# Patient Record
Sex: Female | Born: 1945
Health system: Southern US, Community
[De-identification: ages and names within clinical notes are randomized; demographics above are authoritative.]

## PROBLEM LIST (undated history)

## (undated) DIAGNOSIS — C801 Malignant (primary) neoplasm, unspecified: Secondary | ICD-10-CM

## (undated) DIAGNOSIS — G4733 Obstructive sleep apnea (adult) (pediatric): Secondary | ICD-10-CM

## (undated) DIAGNOSIS — R112 Nausea with vomiting, unspecified: Secondary | ICD-10-CM

## (undated) DIAGNOSIS — Z9989 Dependence on other enabling machines and devices: Secondary | ICD-10-CM

## (undated) DIAGNOSIS — G4700 Insomnia, unspecified: Secondary | ICD-10-CM

## (undated) DIAGNOSIS — N6009 Solitary cyst of unspecified breast: Secondary | ICD-10-CM

## (undated) DIAGNOSIS — Z9889 Other specified postprocedural states: Secondary | ICD-10-CM

## (undated) DIAGNOSIS — U071 COVID-19: Secondary | ICD-10-CM

## (undated) DIAGNOSIS — Z91018 Allergy to other foods: Secondary | ICD-10-CM

## (undated) DIAGNOSIS — I10 Essential (primary) hypertension: Secondary | ICD-10-CM

## (undated) DIAGNOSIS — H1010 Acute atopic conjunctivitis, unspecified eye: Secondary | ICD-10-CM

## (undated) DIAGNOSIS — K219 Gastro-esophageal reflux disease without esophagitis: Secondary | ICD-10-CM

## (undated) HISTORY — DX: Allergy to other foods: Z91.018

## (undated) HISTORY — DX: Insomnia, unspecified: G47.00

## (undated) HISTORY — PX: TONSILLECTOMY: SHX5217

## (undated) HISTORY — PX: OTHER SURGICAL HISTORY: SHX169

## (undated) HISTORY — PX: DILATION AND CURETTAGE OF UTERUS: SHX78

## (undated) HISTORY — DX: Obstructive sleep apnea (adult) (pediatric): G47.33

## (undated) HISTORY — DX: COVID-19: U07.1

## (undated) HISTORY — DX: Malignant (primary) neoplasm, unspecified: C80.1

## (undated) HISTORY — PX: CATARACT EXTRACTION, BILATERAL: SHX1313

## (undated) HISTORY — DX: Dependence on other enabling machines and devices: Z99.89

## (undated) HISTORY — DX: Essential (primary) hypertension: I10

## (undated) HISTORY — DX: Acute atopic conjunctivitis, unspecified eye: H10.10

## (undated) HISTORY — DX: Solitary cyst of unspecified breast: N60.09

## (undated) HISTORY — DX: Gastro-esophageal reflux disease without esophagitis: K21.9

---

## 1976-05-14 HISTORY — PX: TOTAL ABDOMINAL HYSTERECTOMY: SHX209

## 1989-05-14 HISTORY — PX: CHOLECYSTECTOMY: SHX55

## 1999-09-04 ENCOUNTER — Encounter: Admission: RE | Admit: 1999-09-04 | Discharge: 1999-09-04 | Payer: Self-pay | Admitting: Obstetrics and Gynecology

## 1999-09-04 ENCOUNTER — Encounter: Payer: Self-pay | Admitting: Obstetrics and Gynecology

## 2000-01-30 ENCOUNTER — Encounter (INDEPENDENT_AMBULATORY_CARE_PROVIDER_SITE_OTHER): Payer: Self-pay | Admitting: Specialist

## 2000-01-30 ENCOUNTER — Other Ambulatory Visit: Admission: RE | Admit: 2000-01-30 | Discharge: 2000-01-30 | Payer: Self-pay | Admitting: Gastroenterology

## 2000-12-13 ENCOUNTER — Encounter: Admission: RE | Admit: 2000-12-13 | Discharge: 2000-12-13 | Payer: Self-pay | Admitting: Obstetrics and Gynecology

## 2000-12-13 ENCOUNTER — Encounter: Payer: Self-pay | Admitting: Obstetrics and Gynecology

## 2001-12-19 ENCOUNTER — Encounter: Payer: Self-pay | Admitting: Obstetrics and Gynecology

## 2001-12-19 ENCOUNTER — Encounter: Admission: RE | Admit: 2001-12-19 | Discharge: 2001-12-19 | Payer: Self-pay | Admitting: Obstetrics and Gynecology

## 2002-01-28 ENCOUNTER — Encounter: Payer: Self-pay | Admitting: Cardiology

## 2002-01-28 ENCOUNTER — Ambulatory Visit (HOSPITAL_COMMUNITY): Admission: RE | Admit: 2002-01-28 | Discharge: 2002-01-28 | Payer: Self-pay | Admitting: Cardiology

## 2003-02-10 ENCOUNTER — Encounter: Admission: RE | Admit: 2003-02-10 | Discharge: 2003-02-10 | Payer: Self-pay | Admitting: Obstetrics and Gynecology

## 2003-02-10 ENCOUNTER — Encounter: Payer: Self-pay | Admitting: Obstetrics and Gynecology

## 2004-05-14 HISTORY — PX: ROTATOR CUFF REPAIR: SHX139

## 2004-05-25 ENCOUNTER — Ambulatory Visit: Payer: Self-pay | Admitting: Internal Medicine

## 2004-08-18 ENCOUNTER — Ambulatory Visit: Payer: Self-pay | Admitting: Internal Medicine

## 2004-10-20 ENCOUNTER — Ambulatory Visit: Payer: Self-pay | Admitting: Internal Medicine

## 2006-01-30 ENCOUNTER — Ambulatory Visit: Payer: Self-pay | Admitting: Internal Medicine

## 2006-03-27 ENCOUNTER — Ambulatory Visit: Payer: Self-pay | Admitting: Internal Medicine

## 2006-05-15 ENCOUNTER — Ambulatory Visit: Payer: Self-pay | Admitting: Internal Medicine

## 2006-07-17 ENCOUNTER — Ambulatory Visit: Payer: Self-pay | Admitting: Internal Medicine

## 2006-09-18 ENCOUNTER — Ambulatory Visit: Payer: Self-pay | Admitting: Internal Medicine

## 2006-10-01 ENCOUNTER — Ambulatory Visit: Payer: Self-pay | Admitting: Internal Medicine

## 2006-11-13 ENCOUNTER — Ambulatory Visit: Payer: Self-pay | Admitting: Internal Medicine

## 2007-01-15 ENCOUNTER — Ambulatory Visit: Payer: Self-pay | Admitting: Internal Medicine

## 2007-03-26 ENCOUNTER — Ambulatory Visit: Payer: Self-pay | Admitting: Internal Medicine

## 2007-04-15 DIAGNOSIS — G4700 Insomnia, unspecified: Secondary | ICD-10-CM | POA: Insufficient documentation

## 2007-04-15 DIAGNOSIS — J302 Other seasonal allergic rhinitis: Secondary | ICD-10-CM | POA: Insufficient documentation

## 2007-04-15 DIAGNOSIS — H1045 Other chronic allergic conjunctivitis: Secondary | ICD-10-CM | POA: Insufficient documentation

## 2007-04-15 DIAGNOSIS — J3089 Other allergic rhinitis: Secondary | ICD-10-CM

## 2007-04-15 DIAGNOSIS — I10 Essential (primary) hypertension: Secondary | ICD-10-CM | POA: Insufficient documentation

## 2007-05-28 ENCOUNTER — Ambulatory Visit: Payer: Self-pay | Admitting: Internal Medicine

## 2007-05-30 DIAGNOSIS — K219 Gastro-esophageal reflux disease without esophagitis: Secondary | ICD-10-CM | POA: Insufficient documentation

## 2007-08-06 ENCOUNTER — Ambulatory Visit: Payer: Self-pay | Admitting: Internal Medicine

## 2007-10-07 ENCOUNTER — Ambulatory Visit: Payer: Self-pay | Admitting: Internal Medicine

## 2007-12-10 ENCOUNTER — Ambulatory Visit: Payer: Self-pay | Admitting: Internal Medicine

## 2008-02-16 ENCOUNTER — Ambulatory Visit: Payer: Self-pay | Admitting: Internal Medicine

## 2008-04-21 ENCOUNTER — Ambulatory Visit: Payer: Self-pay | Admitting: Internal Medicine

## 2008-06-30 ENCOUNTER — Ambulatory Visit: Payer: Self-pay | Admitting: Internal Medicine

## 2008-06-30 DIAGNOSIS — J4520 Mild intermittent asthma, uncomplicated: Secondary | ICD-10-CM | POA: Insufficient documentation

## 2008-06-30 DIAGNOSIS — J209 Acute bronchitis, unspecified: Secondary | ICD-10-CM | POA: Insufficient documentation

## 2008-09-08 ENCOUNTER — Ambulatory Visit: Payer: Self-pay | Admitting: Internal Medicine

## 2008-11-10 ENCOUNTER — Ambulatory Visit: Payer: Self-pay | Admitting: Internal Medicine

## 2008-11-10 DIAGNOSIS — G43109 Migraine with aura, not intractable, without status migrainosus: Secondary | ICD-10-CM | POA: Insufficient documentation

## 2009-01-12 ENCOUNTER — Ambulatory Visit: Payer: Self-pay | Admitting: Internal Medicine

## 2009-03-28 ENCOUNTER — Ambulatory Visit: Payer: Self-pay | Admitting: Internal Medicine

## 2009-06-01 ENCOUNTER — Ambulatory Visit: Payer: Self-pay | Admitting: Internal Medicine

## 2009-06-19 ENCOUNTER — Ambulatory Visit (HOSPITAL_BASED_OUTPATIENT_CLINIC_OR_DEPARTMENT_OTHER): Admission: RE | Admit: 2009-06-19 | Discharge: 2009-06-19 | Payer: Self-pay | Admitting: Internal Medicine

## 2009-06-19 ENCOUNTER — Encounter: Payer: Self-pay | Admitting: Internal Medicine

## 2009-07-06 ENCOUNTER — Ambulatory Visit: Payer: Self-pay | Admitting: Internal Medicine

## 2009-08-03 ENCOUNTER — Encounter: Payer: Self-pay | Admitting: Internal Medicine

## 2009-08-03 ENCOUNTER — Ambulatory Visit (HOSPITAL_BASED_OUTPATIENT_CLINIC_OR_DEPARTMENT_OTHER): Admission: RE | Admit: 2009-08-03 | Discharge: 2009-08-03 | Payer: Self-pay | Admitting: Internal Medicine

## 2009-09-07 ENCOUNTER — Ambulatory Visit: Payer: Self-pay | Admitting: Internal Medicine

## 2009-09-07 DIAGNOSIS — G4733 Obstructive sleep apnea (adult) (pediatric): Secondary | ICD-10-CM | POA: Insufficient documentation

## 2009-09-16 ENCOUNTER — Telehealth (INDEPENDENT_AMBULATORY_CARE_PROVIDER_SITE_OTHER): Payer: Self-pay | Admitting: *Deleted

## 2009-09-19 ENCOUNTER — Telehealth: Payer: Self-pay | Admitting: Internal Medicine

## 2009-10-06 ENCOUNTER — Encounter: Payer: Self-pay | Admitting: Internal Medicine

## 2009-10-17 ENCOUNTER — Telehealth: Payer: Self-pay | Admitting: Internal Medicine

## 2009-10-26 ENCOUNTER — Encounter: Payer: Self-pay | Admitting: Internal Medicine

## 2009-11-04 ENCOUNTER — Ambulatory Visit: Payer: Self-pay | Admitting: Internal Medicine

## 2009-11-04 DIAGNOSIS — G2581 Restless legs syndrome: Secondary | ICD-10-CM | POA: Insufficient documentation

## 2009-11-09 ENCOUNTER — Encounter: Payer: Self-pay | Admitting: Internal Medicine

## 2009-11-16 ENCOUNTER — Encounter: Payer: Self-pay | Admitting: Internal Medicine

## 2009-11-25 ENCOUNTER — Encounter: Payer: Self-pay | Admitting: Internal Medicine

## 2009-12-19 ENCOUNTER — Encounter: Payer: Self-pay | Admitting: Internal Medicine

## 2010-01-05 ENCOUNTER — Ambulatory Visit: Payer: Self-pay | Admitting: Internal Medicine

## 2010-02-02 ENCOUNTER — Ambulatory Visit: Payer: Self-pay | Admitting: Internal Medicine

## 2010-03-09 ENCOUNTER — Ambulatory Visit: Payer: Self-pay | Admitting: Internal Medicine

## 2010-05-10 ENCOUNTER — Ambulatory Visit: Payer: Self-pay | Admitting: Internal Medicine

## 2010-06-13 NOTE — Assessment & Plan Note (Signed)
Summary: ROV 2 MONTHS///KP   Primary Provider/Referring Provider:  A.Little/card  CC:  2 month follow up.  History of Present Illness:  01/12/09- Allergic rhinitis, insomnia Allergy symptoms have increased in past 2 weeks with dry cough, watery eyes, nasal congestion. Denies chest tightness, wheeze, phlegm, fever, reflux, chest pain or palpittation. Insomnia varies, but she never sleeps very well. Alfonso Patten is better than other things tried.  March 28, 2009-Allergic rhinitis, asthma, Insomnia Right ear hurts, dry cough x 2 -3 days. No sore throat, headache or fever.  She has had a lot of stress- grandson needing plastic surgery for dog bites.This impacts sleep and leaves her fatigued. Needs Flu vax as discussed.  June 01, 2009- Allergic rhinitis, asthma, Insomnia Family recorded her snoring and she discussed this with Dr Elana Alm, raising concern of possible sleep apnea. She has been treated here for insomnia complaint with difficulty initiating and maintaining sleep. Morning headache. Weight has been stable. Sleeps alone. She has been stressed by dog-bite injury of her grandson. She has had recurrent episodes of bronchitis, seemingly slow to clear with Z pak.   Medications Prior to Update: 1)  Estrace 1 Mg  Tabs (Estradiol) .... Take 1 Tablet By Mouth Once A Day 2)  Maxzide-25 37.5-25 Mg  Tabs (Triamterene-Hctz) .... Take 1 Tablet By Mouth Once A Day 3)  Vytorin 10-20 Mg  Tabs (Ezetimibe-Simvastatin) .... Take 1 Tablet By Mouth Once A Day 4)  Aciphex 20 Mg  Tbec (Rabeprazole Sodium) .... Take 1 Tablet By Mouth Once A Day 5)  Sular 17 Mg  Tb24 (Nisoldipine) .... Take 1 Tablet By Mouth Once A Day 6)  Labetalol Hcl 200 Mg  Tabs (Labetalol Hcl) .... Take 1 Tablet By Mouth Two Times A Day 7)  Lunesta 3 Mg  Tabs (Eszopiclone) .Marland Kitchen.. 1 For Sleep If Neede 8)  Fluticasone Propionate 50 Mcg/act Susp (Fluticasone Propionate) .Marland Kitchen.. 1-2 Sprays Each Nostril Daily  Current Medications  (verified): 1)  Estrace 1 Mg  Tabs (Estradiol) .... Take 1 Tablet By Mouth Once A Day 2)  Maxzide-25 37.5-25 Mg  Tabs (Triamterene-Hctz) .... Take 1 Tablet By Mouth Once A Day 3)  Vytorin 10-20 Mg  Tabs (Ezetimibe-Simvastatin) .... Take 1 Tablet By Mouth Once A Day 4)  Aciphex 20 Mg  Tbec (Rabeprazole Sodium) .... Take 1 Tablet By Mouth Once A Day 5)  Sular 17 Mg  Tb24 (Nisoldipine) .... Take 1 Tablet By Mouth Once A Day 6)  Labetalol Hcl 200 Mg  Tabs (Labetalol Hcl) .... Take 1 Tablet By Mouth Two Times A Day 7)  Lunesta 3 Mg  Tabs (Eszopiclone) .Marland Kitchen.. 1 For Sleep If Neede 8)  Fluticasone Propionate 50 Mcg/act Susp (Fluticasone Propionate) .Marland Kitchen.. 1-2 Sprays Each Nostril Daily  Allergies: 1)  ! Codeine 2)  ! Pcn 3)  ! Reglan 4)  ! Compazine 5)  ! Claritin (Loratadine) 6)  ! Ibuprofen (Ibuprofen) 7)  ! * Strawberries  Comments:  Nurse/Medical Assistant: The patient's medications and allergies were reviewed with the patient and were updated in the Medication and Allergy Lists.  Past History:  Past Medical History: Last updated: 09/08/2008 INSOMNIA (ICD-780.52) ALLERGIC RHINITIS (ICD-477.9) ALLERGIC CONJUNCTIVITIS (ICD-372.14) G E R D (ICD-530.81) ESOPHAGEAL REFLUX (ICD-530.81) HYPERTENSION (ICD-401.9) Food allergy- angioedema- strawberries Left breast cyst- ultrasound benign 2010  Past Surgical History: Last updated: 2007-06-21 Tonsillectomy hysterectomy  Family History: Last updated: 2007-06-21 Father died brain tumor Mother died COPD  Social History: Last updated: 06/01/2009 Patient never smoked.  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  The patient denies anorexia,  fever, weight loss, weight gain, vision loss, decreased hearing, hoarseness, chest pain, syncope, dyspnea on exertion, peripheral edema, prolonged cough, hemoptysis, abdominal pain, and severe indigestion/heartburn.    Vital Signs:  Patient profile:   65 year old female Height:      63 inches Weight:      189.25 pounds O2 Sat:      96 % on Room air Pulse rate:   78 / minute BP sitting:   138 / 74  (left arm) Cuff size:   regular  Vitals Entered By: Randell Loop CMA (June 01, 2009 11:05 AM)  O2 Sat at Rest %:  96 O2 Flow:  Room air CC: 2 month follow up Is Patient Diabetic? No Pain Assessment Patient in pain? no      Comments no changes in meds today   Physical Exam  Additional Exam:  General: A/Ox3; pleasant and cooperative, NAD, overweight, calm SKIN: no rash, lesions NODES: no lymphadenopathy HEENT: [place]/AT, EOM- WNL, Conjuctivae- clear, PERRLA, TM-cerumen, Nose- clear with clear mucus bridging, no polyps or erosions, Throat- clear and wnl, [place]  III NECK: Supple w/ fair ROM, JVD- none, normal carotid impulses w/o bruits  CHEST: Clear to P&A HEART: RRR, no m/g/r heard ABDOMEN: soft ZOX:WRUE, nl pulses, no edema  NEURO: Grossly intact to observation      Impression & Recommendations:  Problem # 1:  BRONCHITIS (ICD-490)  Recurrent bronchitis has sounded viral. We will get CXR. She never smoked and doesn't recognize reflux.  Problem # 2:  INSOMNIA (ICD-780.52)  Now question of obstructive sleep apnea. Body habitus, witnessed snoring, morning headaches and hypertension are consistent with this dx. We discussed the medical issues and will get a sleep study. Her updated medication list for this problem includes:    Lunesta 3 Mg Tabs (Eszopiclone) .Marland Kitchen... 1 for sleep if neede  Other Orders: Est. Patient Level II (45409) T-2 View CXR (71020TC) Sleep Disorder Referral (Sleep Disorder)  Patient Instructions: 1)  Please schedule a follow-up appointment in 1  month. 2)  See [facility] to schedule sleep study 3)  A chest x-ray has been recommended.  Your imaging study may require preauthorization.    Immunization History:  Influenza Immunization History:    Influenza:  historical (03/14/2009)

## 2010-06-13 NOTE — Letter (Signed)
Summary: CMN for CPAP Supplies/[facility]  CMN for CPAP Supplies/[facility]   Imported By: Sherian Rein 11/21/2009 10:05:08  _____________________________________________________________________  External Attachment:    Type:   Image     Comment:   External Document

## 2010-06-13 NOTE — Letter (Signed)
Summary: [facility]  [facility]   Imported By: Sherian Rein 12/15/2009 08:23:46  _____________________________________________________________________  External Attachment:    Type:   Image     Comment:   External Document

## 2010-06-13 NOTE — Letter (Signed)
Summary: [facility]  [facility]   Imported By: Sherian Rein 11/17/2009 15:01:11  _____________________________________________________________________  External Attachment:    Type:   Image     Comment:   External Document

## 2010-06-13 NOTE — Assessment & Plan Note (Signed)
Summary: ? bronchitis/kcw   Primary Provider/Referring Provider:  A.Little/card  CC:  Accute visit-? bronchitis-cough-dry and tightness in chest.Currently taking Prednisone and Tussionex; had Zpak already form PCP.Marland Kitchen  History of Present Illness: November 04, 2009- Allergic rhinitis, Asthma, Insomnia w. OSA Struggling still with CPAP. Mask fit remains an issue. She is trying very hard- good compliance but sleep is still very restless even with clonazepam. Aware more of restless legs, despite clonazepam. Much stress. Reviewed notes from cardiology about BP management.   January 05, 2010- Allergic rhinitis, asthma, Insomnia/ OSA Reports lower BP despite being off Norvasc- with less peripheral edema now. Not needing routine antihistamines. CPAP- she is wearing it longer and sleeping more deeply. Having some bad dreams. Discussed alternatives to CPAP, including nasal valves. It was helpful to meet with the [facility] staff. They fitted her with a nasal mask she likes better. Pressure is still 10.   February 02, 2010- Allergic rhinitis, asthma, Insomnia/ OSA Acute visit. Woke with substernal burning Sept 8. By 4 days later she was coughing, chest congested. Maybe sore throat initially. Began waking herself wheezing.  Dr Elana Alm had called in Z pak then pred pack- day 9/12.. Dry hack still. Denies ears or nose. Stomach ok. She is pretty sure she didn't reflux to start this. Heat on chest feels good. Has Tussionex- helps at night. Got Ventolin inhaler- not used enough to tell if it helps.   Continues trying to make CPAP work for her.    Preventive Screening-Counseling & Management  Alcohol-Tobacco     Smoking Status: never  Allergies: 1)  ! Codeine 2)  ! Pcn 3)  ! Reglan 4)  ! Compazine 5)  ! Ibuprofen (Ibuprofen) 6)  ! * Strawberries  Past History:  Past Medical History: Last updated: 09/07/2009 INSOMNIA (ICD-780.52) Obstructive sleep apnea- NPSG 08/03/09: AHI 17.6; CPAP 12/ AHI 0;  PLMA ALLERGIC RHINITIS (ICD-477.9) ALLERGIC CONJUNCTIVITIS (ICD-372.14) G E R D (ICD-530.81) ESOPHAGEAL REFLUX (ICD-530.81) HYPERTENSION (ICD-401.9) Food allergy- angioedema- strawberries Left breast cyst- ultrasound benign 2010  Past Surgical History: Last updated: 2007/06/10 Tonsillectomy hysterectomy  Family History: Last updated: June 10, 2007 Father died brain tumor Mother died COPD  Social History: Last updated: 06/01/2009 Patient never smoked.  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  The patient denies shortness of breath with activity, shortness of breath at rest, non-productive cough, coughing up blood, chest pain, irregular heartbeats, acid heartburn, indigestion, loss of appetite, weight change, abdominal pain, difficulty swallowing, tooth/dental problems, headaches, nasal congestion/difficulty breathing through nose, sneezing, ear ache, hand/feet swelling, rash, and fever.    Vital Signs:  Patient profile:   65 year old female Height:      63 inches Weight:      188 pounds BMI:     33.42 O2 Sat:      97 % on Room air Pulse rate:   81 / minute BP sitting:   138 / 78  (left arm) Cuff size:   regular  Vitals Entered By: Reynaldo Minium CMA (February 02, 2010 2:32 PM)  O2 Flow:  Room air CC: Accute visit-? bronchitis-cough-dry and tightness in chest.Currently taking Prednisone and Tussionex; had Zpak already form PCP.   Physical Exam  Additional Exam:  General: A/Ox3; pleasant and cooperative, NAD, overweight, calm SKIN: no rash, lesions, flushed,  clammy NODES: no lymphadenopathy HEENT: [place]/AT, EOM- WNL, Conjuctivae- clear, PERRLA, TM-cerumen, Nose- clear , Throat- clear and wnl, Mallampati  III NECK: Supple w/ fair ROM, JVD- none, normal carotid impulses w/o bruits  CHEST: Clear to  P&A HEART: RRR, no m/g/r heard ABDOMEN: soft WJX:BJYN, nl pulses, no edema  NEURO: Grossly intact to observation      Impression & Recommendations:  Problem # 1:  BRONCHITIS (ICD-490) Acute bronchits. it likely began as a viral process with little URI, but may progress to bacterial superinfection. She understands the conservative "fluid and rest" back bone options. DDX includes possibility that she might have refluxed and aspirated during her sleep. She doesn't think so.   Her updated medication list for this problem includes:    Benzonatate 100 Mg Caps (Benzonatate) .Marland Kitchen... 1-2 three times a day as needed for  cough  Problem # 2:  INSOMNIA WITH SLEEP APNEA UNSPECIFIED (ICD-780.51) Harder now while she doesn't feel well, but encouraged to keep working with CPAP. I am researching other options that might make sense.  Medications Added to Medication List This Visit: 1)  Benzonatate 100 Mg Caps (Benzonatate) .Marland Kitchen.. 1-2 three times a day as needed for  cough  Other Orders: Est. Patient Level III (82956)  Patient Instructions: 1)  keep appt 2)    3)  Fluids, rest, tylenol.finish the prednisone.  4)  benzonatate perles for cough if needed Prescriptions: BENZONATATE 100 MG CAPS (BENZONATATE) 1-2 three times a day as needed for  cough  #20 x 3   Entered and Authorized by:   Waymon Budge MD   Signed by:   Waymon Budge MD on 02/02/2010   Method used:   Print then Give to Patient   RxID:   [id number]

## 2010-06-13 NOTE — Assessment & Plan Note (Signed)
Summary: rov 2 months///kp   Primary Provider/Referring Provider:  A.Little/card  CC:  2 month follow up visit-Insomnia-still adjusting to CPAP machine.Teresa Molina  History of Present Illness:  January 05, 2010- Allergic rhinitis, asthma, Insomnia/ OSA Reports lower BP despite being off Norvasc- with less peripheral edema now. Not needing routine antihistamines. CPAP- she is wearing it longer and sleeping more deeply. Having some bad dreams. Discussed alternatives to CPAP, including nasal valves. It was helpful to meet with the [facility] staff. They fitted her with a nasal mask she likes better. Pressure is still 10.   February 02, 2010- Allergic rhinitis, asthma, Insomnia/ OSA Acute visit. Woke with substernal burning Sept 8. By 4 days later she was coughing, chest congested. Maybe sore throat initially. Began waking herself wheezing.  Dr Elana Alm had called in Z pak then pred pack- day 9/12.. Dry hack still. Denies ears or nose. Stomach ok. She is pretty sure she didn't reflux to start this. Heat on chest feels good. Has Tussionex- helps at night. Got Ventolin inhaler- not used enough to tell if it helps.   Continues trying to make CPAP work for her.  March 14, 2010- Allergic rhinitis, asthma, Insomnia/ OSA Nurse-CC: 2 month follow up visit-Insomnia-still adjusting to CPAP machine. Has has episode of bronchitis, resolving. no fever ofr puruent sputum. needs flu shot. Discused guidance for pneumovax and she will wait.  Still trying to learn to tolerate the CPAP machine and we discussed that and alternatives again today. Pressure is still at 10. She has searched a website for an ENT practice advertising insertion of palatal pillars and we discussed that as well as nasal valves/ Provent.    Preventive Screening-Counseling & Management  Alcohol-Tobacco     Smoking Status: never  Current Medications (verified): 1)  Estrace 1 Mg  Tabs (Estradiol) .... Take 1 Tablet By Mouth Once A Day 2)   Maxzide-25 37.5-25 Mg  Tabs (Triamterene-Hctz) .... Take 1 Tablet By Mouth Once A Day 3)  Vytorin 10-20 Mg  Tabs (Ezetimibe-Simvastatin) .... Take 1 Tablet By Mouth Once A Day 4)  Aciphex 20 Mg  Tbec (Rabeprazole Sodium) .... Take 1 Tablet By Mouth Once A Day 5)  Labetalol Hcl 200 Mg  Tabs (Labetalol Hcl) .... Take 1 Tablet By Mouth Two Times A Day 6)  Clonazepam 0.5 Mg Tabs (Clonazepam) .Teresa Molina.. 1-2 For Sleep If Needed 7)  Cpap [employer] .Teresa Molina.. 10 Cwp 8)  Furosemide 40 Mg Tabs (Furosemide) .... Take 1 By Mouth Once Daily As Needed 9)  Aspirin 81 Mg Tbec (Aspirin) .... Take 1 By Mouth Once Daily  Allergies (verified): 1)  ! Codeine 2)  ! Pcn 3)  ! Reglan 4)  ! Compazine 5)  ! Ibuprofen (Ibuprofen) 6)  ! * Strawberries  Past History:  Past Medical History: Last updated: 09/07/2009 INSOMNIA (ICD-780.52) Obstructive sleep apnea- NPSG 08/03/09: AHI 17.6; CPAP 12/ AHI 0; PLMA ALLERGIC RHINITIS (ICD-477.9) ALLERGIC CONJUNCTIVITIS (ICD-372.14) G E R D (ICD-530.81) ESOPHAGEAL REFLUX (ICD-530.81) HYPERTENSION (ICD-401.9) Food allergy- angioedema- strawberries Left breast cyst- ultrasound benign 2010  Past Surgical History: Last updated: June 25, 2007 Tonsillectomy hysterectomy  Family History: Last updated: June 25, 2007 Father died brain tumor Mother died COPD  Social History: Last updated: 06/01/2009 Patient never smoked.  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  The patient denies anorexia, fever, weight loss, weight gain, vision loss, decreased hearing, hoarseness, chest pain, syncope, dyspnea on exertion, peripheral edema, headaches, hemoptysis, abdominal pain, severe indigestion/heartburn, difficulty walking, enlarged lymph nodes, angioedema, and breast masses.    Vital Signs:  Patient  profile:   65 year old female Height:      63 inches Weight:      190.50 pounds BMI:     33.87 O2 Sat:      99 % on Room air Pulse rate:   76 / minute BP sitting:   122 / 72  (left arm) Cuff size:   regular  Vitals Entered By: Reynaldo Minium CMA (March 09, 2010 11:13 AM)  O2 Flow:  Room air CC: 2 month follow up visit-Insomnia-still adjusting to CPAP machine.   Physical Exam  Additional Exam:  General: A/Ox3; pleasant and cooperative, NAD, overweight, calm SKIN: no rash, lesions, flushed, clammy NODES: no lymphadenopathy HEENT: [place]/AT, EOM- WNL, Conjuctivae- clear, PERRLA, TM-cerumen, Nose- clear , Throat- clear and wnl, Mallampati  III NECK: Supple w/ fair ROM, JVD- none, normal carotid impulses w/o bruits  CHEST: Clear to P&A, no cough or wheeze while here HEART: RRR, no m/g/r heard ABDOMEN: soft ZOX:WRUE, nl pulses, no edema  NEURO: Grossly intact to observation      Impression & Recommendations:  Problem # 1:  INSOMNIA WITH SLEEP APNEA UNSPECIFIED (ICD-780.51)  She continues to tend toward insomnia. Clonazepam has helped. She has made a real effort with CPAP, but it may not be a successful long term option for her. We are researching some alternatives and I have placed a call about the Provent nasal valves. Weight loss hasn't happened.   Problem # 2:  BRONCHITIS (ICD-490)  Mild recent bronchitis has not triggered her asthma and seems to be self limited at this point. We discussed conservative measures and potential problems to watch for. I don't think she needs and antibiotic at this point.  The following medications were removed from the medication list:    Benzonatate 100 Mg Caps (Benzonatate) .Teresa Molina... 1-2 three times a day as needed for  cough  Other Orders: Est. Patient Level III (45409)  Patient Instructions: 1)  Please schedule a follow-up appointment in 2 months. 2)  Flu vax 3)  I will check on ProVent nasal pieces 4)  cc Dr Clarene Duke

## 2010-06-13 NOTE — Letter (Signed)
Summary: [facility]  [facility]   Imported By: Sherian Rein 01/03/2010 11:39:34  _____________________________________________________________________  External Attachment:    Type:   Image     Comment:   External Document

## 2010-06-13 NOTE — Assessment & Plan Note (Signed)
Summary: 65m reck/klw   Primary Provider/Referring Provider:  A.Little/card  CC:  Follow up visit.  History of Present Illness: March 28, 2009-Allergic rhinitis, asthma, Insomnia Right ear hurts, dry cough x 2 -3 days. No sore throat, headache or fever.  She has had a lot of stress- grandson needing plastic surgery for dog bites.This impacts sleep and leaves her fatigued. Needs Flu vax as discussed.  June 01, 2009- Allergic rhinitis, asthma, Insomnia Family recorded her snoring and she discussed this with Dr Elana Alm, raising concern of possible sleep apnea. She has been treated here for insomnia complaint with difficulty initiating and maintaining sleep. Morning headache. Weight has been stable. Sleeps alone. She has been stressed by dog-bite injury of her grandson. She has had recurrent episodes of bronchitis, seemingly slow to clear with Z pak.  July 06, 2009- Allergic rhinitis, asthma, insomnia Unable to sleep for sleep study. The concern had been possible insomnia with sleep apnea. She has a long hx of difficulty initiating and maintaining sleep with limited response to several common sleep meds. She has also been introduced to cognitive behavioral therapy. I will discuss possibility of a repeat sleep study aided by a sleeping pill.  September 07, 2009- Allergic rhinitis, Asthma, Insomnia with Sleep apnea Reviewed NPSG: Moderate obstructive apnea, AHI 17.6/ hr with snoring and desat. CPAP12/ AHI 0. She also had periodic limb movement with arousal 9-18/hr, which she is unaware of. Her primary sleep complaint has been insomnia with difficulty initiating and maintaining sleep, and daytime tiredness.   Current Medications (verified): 1)  Estrace 1 Mg  Tabs (Estradiol) .... Take 1 Tablet By Mouth Once A Day 2)  Maxzide-25 37.5-25 Mg  Tabs (Triamterene-Hctz) .... Take 1 Tablet By Mouth Once A Day 3)  Vytorin 10-20 Mg  Tabs (Ezetimibe-Simvastatin) .... Take 1 Tablet By Mouth Once A  Day 4)  Aciphex 20 Mg  Tbec (Rabeprazole Sodium) .... Take 1 Tablet By Mouth Once A Day 5)  Labetalol Hcl 200 Mg  Tabs (Labetalol Hcl) .... Take 1 Tablet By Mouth Two Times A Day 6)  Lunesta 3 Mg  Tabs (Eszopiclone) .Marland Kitchen.. 1 For Sleep If Neede 7)  Fluticasone Propionate 50 Mcg/act Susp (Fluticasone Propionate) .Marland Kitchen.. 1-2 Sprays Each Nostril Daily 8)  Norvasc 5 Mg Tabs (Amlodipine Besylate) .... Take 1 By Mouth Once Daily 9)  Imipramine Hcl 50 Mg Tabs (Imipramine Hcl) .... Take 1 By Mouth Once Daily  Allergies (verified): 1)  ! Codeine 2)  ! Pcn 3)  ! Reglan 4)  ! Compazine 5)  ! Claritin (Loratadine) 6)  ! Ibuprofen (Ibuprofen) 7)  ! * Strawberries  Past History:  Past Surgical History: Last updated: Jun 17, 2007 Tonsillectomy hysterectomy  Family History: Last updated: 17-Jun-2007 Father died brain tumor Mother died COPD  Social History: Last updated: 06/01/2009 Patient never smoked.  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, Has been working with [employer]  Risk Factors: Smoking Status: never (17-Jun-2007)  Past Medical History: INSOMNIA (ICD-780.52) Obstructive sleep apnea- NPSG 08/03/09: AHI 17.6; CPAP 12/ AHI 0; PLMA ALLERGIC RHINITIS (ICD-477.9) ALLERGIC CONJUNCTIVITIS (ICD-372.14) G E R D (ICD-530.81) ESOPHAGEAL REFLUX (ICD-530.81) HYPERTENSION (ICD-401.9) Food allergy- angioedema- strawberries Left breast cyst- ultrasound benign 2010  Review of Systems      See HPI  The patient denies anorexia, fever, weight loss, weight gain, vision loss, decreased hearing, hoarseness, chest pain, syncope, dyspnea on exertion, peripheral edema, prolonged cough, headaches, and severe indigestion/heartburn.    Vital Signs:  Patient profile:   65 year  old female Height:      63 inches Weight:      186.50 pounds BMI:     33.16 O2 Sat:      97 % on Room air Pulse rate:   86 / minute BP sitting:   110 / 72  (left arm) Cuff size:   large  Vitals Entered By: Reynaldo Minium CMA (September 07, 2009 11:12 AM)  O2 Flow:  Room air  Physical Exam  Additional Exam:  General: A/Ox3; pleasant and cooperative, NAD, overweight, calm SKIN: no rash, lesions NODES: no lymphadenopathy HEENT: [place]/AT, EOM- WNL, Conjuctivae- clear, PERRLA, TM-cerumen, Nose- clear with clear mucus bridging, no polyps or erosions, Throat- clear and wnl, Mallampati  III NECK: Supple w/ fair ROM, JVD- none, normal carotid impulses w/o bruits  CHEST: Clear to P&A HEART: RRR, no m/g/r heard ABDOMEN: soft EXB:MWUX, nl pulses, no edema  NEURO: Grossly intact to observation      Impression & Recommendations:  Problem # 1:  INSOMNIA WITH SLEEP APNEA UNSPECIFIED (ICD-780.51) We discussed the medical implications of insomnia with sleep apnea, treatments and CPAP mamagement. We will start a CPAP trial, using clonazepam to consolidate sleep.  Medications Added to Medication List This Visit: 1)  Clonazepam 0.5 Mg Tabs (Clonazepam) .Marland Kitchen.. 1-2 for sleep if needed  Other Orders: Est. Patient Level II (32440) DME Referral (DME)  Patient Instructions: 1)  Please schedule a follow-up appointment in 1 month. 2)  See [facility] to start CPAP 3)  Try clonazepam for sleep Prescriptions: CLONAZEPAM 0.5 MG TABS (CLONAZEPAM) 1-2 for sleep if needed  #50 x 1   Entered and Authorized by:   Waymon Budge MD   Signed by:   Waymon Budge MD on 09/07/2009   Method used:   Print then Give to Patient   RxID:   [id number]

## 2010-06-13 NOTE — Assessment & Plan Note (Signed)
Summary: rov 5 wks ///kp   Primary Provider/Referring Provider:  A.Little/card  CC:  Follow up visit.  History of Present Illness:  July 06, 2009- Allergic rhinitis, asthma, insomnia Unable to sleep for sleep study. The concern had been possible insomnia with sleep apnea. She has a long hx of difficulty initiating and maintaining sleep with limited response to several common sleep meds. She has also been introduced to cognitive behavioral therapy. I will discuss possibility of a repeat sleep study aided by a sleeping pill.  September 07, 2009- Allergic rhinitis, Asthma, Insomnia with Sleep apnea Reviewed NPSG: Moderate obstructive apnea, AHI 17.6/ hr with snoring and desat. CPAP12/ AHI 0. She also had periodic limb movement with arousal 9-18/hr, which she is unaware of. Her primary sleep complaint has been insomnia with difficulty initiating and maintaining sleep, and daytime tiredness.  November 04, 2009- Allergic rhinitis, Asthma, Insomnia w. OSA Struggling still with CPAP. Mask fit remains an issue. She is trying very hard- good compliance but sleep is still very restless even with clonazepam. Aware more of restless legs, despite clonazepam. Much stress. Reviewed notes from cardiology about BP management.    Preventive Screening-Counseling & Management  Alcohol-Tobacco     Smoking Status: never  Current Medications (verified): 1)  Estrace 1 Mg  Tabs (Estradiol) .... Take 1 Tablet By Mouth Once A Day 2)  Maxzide-25 37.5-25 Mg  Tabs (Triamterene-Hctz) .... Take 1 Tablet By Mouth Once A Day 3)  Vytorin 10-20 Mg  Tabs (Ezetimibe-Simvastatin) .... Take 1 Tablet By Mouth Once A Day 4)  Aciphex 20 Mg  Tbec (Rabeprazole Sodium) .... Take 1 Tablet By Mouth Once A Day 5)  Labetalol Hcl 200 Mg  Tabs (Labetalol Hcl) .... Take 1 Tablet By Mouth Two Times A Day 6)  Norvasc 5 Mg Tabs (Amlodipine Besylate) .... Take 1 By Mouth Once Daily 7)  Clonazepam 0.5 Mg Tabs (Clonazepam) .Marland Kitchen.. 1-2 For Sleep If  Needed 8)  Cpap [employer] .Marland Kitchen.. 10 Cwp 9)  Furosemide 40 Mg Tabs (Furosemide) .... Take 1 By Mouth Once Daily As Needed 10)  Aspirin 81 Mg Tbec (Aspirin) .... Take 1 By Mouth Once Daily  Allergies (verified): 1)  ! Codeine 2)  ! Pcn 3)  ! Reglan 4)  ! Compazine 5)  ! Claritin (Loratadine) 6)  ! Ibuprofen (Ibuprofen) 7)  ! * Strawberries  Past History:  Past Medical History: Last updated: 09/07/2009 INSOMNIA (ICD-780.52) Obstructive sleep apnea- NPSG 08/03/09: AHI 17.6; CPAP 12/ AHI 0; PLMA ALLERGIC RHINITIS (ICD-477.9) ALLERGIC CONJUNCTIVITIS (ICD-372.14) G E R D (ICD-530.81) ESOPHAGEAL REFLUX (ICD-530.81) HYPERTENSION (ICD-401.9) Food allergy- angioedema- strawberries Left breast cyst- ultrasound benign 2010  Past Surgical History: Last updated: 2007-06-06 Tonsillectomy hysterectomy  Family History: Last updated: 06-06-2007 Father died brain tumor Mother died COPD  Social History: Last updated: 06/01/2009 Patient never smoked.  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  The patient denies shortness of breath with activity, shortness of breath at rest, productive cough, non-productive cough, coughing up blood, chest pain, irregular heartbeats, acid heartburn, indigestion, loss of appetite, weight change, abdominal pain, difficulty swallowing, sore throat, tooth/dental problems, headaches, nasal congestion/difficulty breathing through nose, sneezing, itching, and ear ache.    Vital Signs:  Patient profile:   65 year old female Height:  63 inches Weight:      183 pounds BMI:     32.53 O2 Sat:      96 % on Room air Pulse rate:   79 / minute BP sitting:   120 / 72  (left arm) Cuff size:   regular  Vitals Entered By: Reynaldo Minium CMA (November 04, 2009 10:31 AM)  O2 Flow:  Room  air CC: Follow up visit   Physical Exam  Additional Exam:  General: A/Ox3; pleasant and cooperative, NAD, overweight, calm SKIN: no rash, lesions NODES: no lymphadenopathy HEENT: [place]/AT, EOM- WNL, Conjuctivae- clear, PERRLA, TM-cerumen, Nose- clear with clear mucus bridging, no polyps or erosions, Throat- clear and wnl, Mallampati  III NECK: Supple w/ fair ROM, JVD- none, normal carotid impulses w/o bruits  CHEST: Clear to P&A HEART: RRR, no m/g/r heard ABDOMEN: soft ZOX:WRUE, nl pulses, no edema  NEURO: Grossly intact to observation      Impression & Recommendations:  Problem # 1:  INSOMNIA WITH SLEEP APNEA UNSPECIFIED (ICD-780.51)  We will try Sleep center staff desensitization,  and add  Nuvigil trial to help with daytime alertness. Medication talk done.  Problem # 2:  INSOMNIA (ICD-780.52) She continues clonazepam. We have given information on cognitive behavioral therapy. I shared recent report on cooling of "busy brain" and she will try that. The following medications were removed from the medication list:    Lunesta 3 Mg Tabs (Eszopiclone) .Marland Kitchen... 1 for sleep if neede  Problem # 3:  RESTLESS LEGS SYNDROME (ICD-333.94) Not clear how important this is clinically for her. We discussed options beyond clonazepam and decided to let her try Requip.  Medications Added to Medication List This Visit: 1)  Furosemide 40 Mg Tabs (Furosemide) .... Take 1 by mouth once daily as needed 2)  Aspirin 81 Mg Tbec (Aspirin) .... Take 1 by mouth once daily 3)  Requip 0.25 Mg Tabs (Ropinirole hcl) .Marland Kitchen.. 1-3 tabs, taken an hour before anticipated bedtime  Other Orders: Est. Patient Level II (45409) Sleep Disorder Referral (Sleep Disorder)  Patient Instructions: 1)  Please schedule a follow-up appointment in 2 months. 2)  Try the idea of an ice bag to cool your  brain some as an aid to sleep. 3)  Try Requip for restless legs to see if it helps sleep- take it by itself, an hour before bedtime.  You can choose to also take clonazepam if it helps. 4)  See [facility] for referral to the sleep center staff for help wih CPAP. Prescriptions: REQUIP 0.25 MG TABS (ROPINIROLE HCL) 1-3 tabs, taken an hour before anticipated bedtime  #30 x 2   Entered and Authorized by:   Waymon Budge MD   Signed by:   Waymon Budge MD on 11/04/2009   Method used:   Print then Give to Patient   RxID:   [id number]

## 2010-06-13 NOTE — Progress Notes (Signed)
Summary: Autotitrate CPAP for reassessment  Phone Note Call from Patient   Summary of Call: Initial CPAP pressure of 12 is tooo high We will retry with autotitration. I suggested desensitization by sitting on side of bed etc. Initial call taken by: Waymon Budge MD,  Sep 19, 2009 9:56 PM    New/Updated Medications: * CPAP [facility] Auto to start

## 2010-06-13 NOTE — Progress Notes (Signed)
Summary: CPAP autotitrated to 10  Phone Note Other Incoming   Summary of Call: Autotitrated 5/19- 10/06/09 to CPAP 10, AHI 6.5 with adequate initial compliance averaging 4.5 hours. We will set to 10. Initial call taken by: Waymon Budge MD,  October 17, 2009 9:27 PM  Follow-up for Phone Call        [facility] for pt to return my call. Rhonda Cobb  October 18, 2009 9:36 AM  Pt states that she is having a lot of trouble with her mask. She stated that she wakes up and it is aroung her chin. She stated that the mask was too big. Advised pt to have the home care company look at the sleep study, b/c it will have on there what size/type of mask they used in the lab. Her home care company and provide her with the same mask and if they don't have it, they can order it. Also offered to have the sleep lab fit her for a mask.  Pt was informed of her cpap autotitration report and is aware that we are changing her pressure to 10 cm. Pt was advised that once cpap has been changed, to call us if she has any problems. Pt voiced understanding and stated she would. Rhonda Cobb  October 18, 2009 11:08 AM     New/Updated Medications: * CPAP [facility] 10 cwp

## 2010-06-13 NOTE — Progress Notes (Signed)
Summary: wants to talk to libby  Phone Note Call from Patient Call back at Home Phone [phone]   Caller: Patient Call For: young Summary of Call: needs to discuss her home care provider call before 11 Initial call taken by: Lacinda Axon,  Sep 16, 2009 10:00 AM  Follow-up for Phone Call        pt decided to use [facility] in [place] and all orders have been faxed there  Follow-up by: Oneita Jolly,  Sep 16, 2009 10:53 AM

## 2010-06-13 NOTE — Assessment & Plan Note (Signed)
Summary: ROV 2 MONTHS///KP   Primary Provider/Referring Provider:  A.Little/card  CC:  2 month follow up visit-Insomnia with sleep apnea and RLS; using CPAP each night but adjusting to it.Teresa Molina  History of Present Illness:  July 06, 2009- Allergic rhinitis, asthma, insomnia Unable to sleep for sleep study. The concern had been possible insomnia with sleep apnea. She has a long hx of difficulty initiating and maintaining sleep with limited response to several common sleep meds. She has also been introduced to cognitive behavioral therapy. I will discuss possibility of a repeat sleep study aided by a sleeping pill.  September 07, 2009- Allergic rhinitis, Asthma, Insomnia with Sleep apnea Reviewed NPSG: Moderate obstructive apnea, AHI 17.6/ hr with snoring and desat. CPAP12/ AHI 0. She also had periodic limb movement with arousal 9-18/hr, which she is unaware of. Her primary sleep complaint has been insomnia with difficulty initiating and maintaining sleep, and daytime tiredness.  November 04, 2009- Allergic rhinitis, Asthma, Insomnia w. OSA Struggling still with CPAP. Mask fit remains an issue. She is trying very hard- good compliance but sleep is still very restless even with clonazepam. Aware more of restless legs, despite clonazepam. Much stress. Reviewed notes from cardiology about BP management.   January 05, 2010- Allergic rhinitis, asthma, Insomnia/ OSA Reports lower BP despite being off Norvasc- with less peripheral edema now. Not needing routine antihistamines. CPAP- she is wearing it longer and sleeping more deeply. Having some bad dreams. Discussed alternatives to CPAP, including nasal valves. It was helpful to meet with the Sleep Center staff. They fitted her with a nasal mask she likes better. Pressure is still 10.    Preventive Screening-Counseling & Management  Alcohol-Tobacco     Smoking Status: never  Current Medications (verified): 1)  Estrace 1 Mg  Tabs (Estradiol) .... Take 1  Tablet By Mouth Once A Day 2)  Maxzide-25 37.5-25 Mg  Tabs (Triamterene-Hctz) .... Take 1 Tablet By Mouth Once A Day 3)  Vytorin 10-20 Mg  Tabs (Ezetimibe-Simvastatin) .... Take 1 Tablet By Mouth Once A Day 4)  Aciphex 20 Mg  Tbec (Rabeprazole Sodium) .... Take 1 Tablet By Mouth Once A Day 5)  Labetalol Hcl 200 Mg  Tabs (Labetalol Hcl) .... Take 1 Tablet By Mouth Two Times A Day 6)  Clonazepam 0.5 Mg Tabs (Clonazepam) .Teresa Molina.. 1-2 For Sleep If Needed 7)  Cpap [employer] .Teresa Molina.. 10 Cwp 8)  Furosemide 40 Mg Tabs (Furosemide) .... Take 1 By Mouth Once Daily As Needed 9)  Aspirin 81 Mg Tbec (Aspirin) .... Take 1 By Mouth Once Daily  Allergies: 1)  ! Codeine 2)  ! Pcn 3)  ! Reglan 4)  ! Compazine 5)  ! Ibuprofen (Ibuprofen) 6)  ! * Strawberries  Past History:  Past Medical History: Last updated: 09/07/2009 INSOMNIA (ICD-780.52) Obstructive sleep apnea- NPSG 08/03/09: AHI 17.6; CPAP 12/ AHI 0; PLMA ALLERGIC RHINITIS (ICD-477.9) ALLERGIC CONJUNCTIVITIS (ICD-372.14) G E R D (ICD-530.81) ESOPHAGEAL REFLUX (ICD-530.81) HYPERTENSION (ICD-401.9) Food allergy- angioedema- strawberries Left breast cyst- ultrasound benign 2010  Past Surgical History: Last updated: 06/18/2007 Tonsillectomy hysterectomy  Family History: Last updated: 18-Jun-2007 Father died brain tumor Mother died COPD  Social History: Last updated: 06/01/2009 Patient never smoked.  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    Vital Signs:  Patient profile:   65 year old female Height:      63 inches Weight:      187.38  pounds BMI:     33.31 O2 Sat:      97 % on Room air Pulse rate:   73 / minute BP sitting:   132 / 80  (left arm) Cuff size:   regular  Vitals Entered By: Reynaldo Minium CMA (January 05, 2010 11:33 AM)  O2 Flow:  Room air CC: 2 month follow up visit-Insomnia with sleep apnea and RLS; using CPAP each night but adjusting to it.   Physical Exam  Additional Exam:  General: A/Ox3; pleasant and cooperative, NAD, overweight, calm SKIN: no rash, lesions NODES: no lymphadenopathy HEENT: [place]/AT, EOM- WNL, Conjuctivae- clear, PERRLA, TM-cerumen, Nose- clear , Throat- clear and wnl, Mallampati  III NECK: Supple w/ fair ROM, JVD- none, normal carotid impulses w/o bruits  CHEST: Clear to P&A HEART: RRR, no m/g/r heard ABDOMEN: soft NFA:OZHY, nl pulses, no edema  NEURO: Grossly intact to observation      Impression & Recommendations:  Problem # 1:  INSOMNIA WITH SLEEP APNEA UNSPECIFIED (ICD-780.51)  She is still struggling with CPAP and finds it cumbersome. We discussed options. Nasal valves may be a future option.  Labetalol may be causing dreams- discussed beta blockers. Her BP has beendifficult to control and she will probably have to put up with this for now.  Orders: Est. Patient Level II (86578)  Problem # 2:  RESTLESS LEGS SYNDROME (ICD-333.94)  Can't tell that she is having a problem so she stopped the meds with no flare so far..  Orders: Est. Patient Level II (46962)  Problem # 3:  INSOMNIA (ICD-780.52)  Insomnia has been a significant problem by itself, probably separate from any sleep disturbance y her sleep apnea. She has not been able to find medication help easily, but we have emphasized nonmedication approaches.  Orders: Est. Patient Level II (95284)  Patient Instructions: 1)  Please schedule a follow-up appointment in 2 months. 2)  Call for refills as needed

## 2010-06-13 NOTE — Assessment & Plan Note (Signed)
Summary: 1 months/ mbw   Primary Provider/Referring Provider:  A.Little/card  CC:  follow up visit.  History of Present Illness:  March 28, 2009-Allergic rhinitis, asthma, Insomnia Right ear hurts, dry cough x 2 -3 days. No sore throat, headache or fever.  She has had a lot of stress- grandson needing plastic surgery for dog bites.This impacts sleep and leaves her fatigued. Needs Flu vax as discussed.  June 01, 2009- Allergic rhinitis, asthma, Insomnia Family recorded her snoring and she discussed this with Dr Elana Alm, raising concern of possible sleep apnea. She has been treated here for insomnia complaint with difficulty initiating and maintaining sleep. Morning headache. Weight has been stable. Sleeps alone. She has been stressed by dog-bite injury of her grandson. She has had recurrent episodes of bronchitis, seemingly slow to clear with Z pak.  July 06, 2009- Allergic rhinitis, asthma, insomnia Unable to sleep for sleep study. The concern had been possible insomnia with sleep apnea. She has a long hx of difficulty initiating and maintaining sleep with limited response to several common sleep meds. She has also been introduced to cognitive behavioral therapy. I will discuss possibility of a repeat sleep study aided by a sleeping pill.   Current Medications (verified): 1)  Estrace 1 Mg  Tabs (Estradiol) .... Take 1 Tablet By Mouth Once A Day 2)  Maxzide-25 37.5-25 Mg  Tabs (Triamterene-Hctz) .... Take 1 Tablet By Mouth Once A Day 3)  Vytorin 10-20 Mg  Tabs (Ezetimibe-Simvastatin) .... Take 1 Tablet By Mouth Once A Day 4)  Aciphex 20 Mg  Tbec (Rabeprazole Sodium) .... Take 1 Tablet By Mouth Once A Day 5)  Labetalol Hcl 200 Mg  Tabs (Labetalol Hcl) .... Take 1 Tablet By Mouth Two Times A Day 6)  Lunesta 3 Mg  Tabs (Eszopiclone) .Marland Kitchen.. 1 For Sleep If Neede 7)  Fluticasone Propionate 50 Mcg/act Susp (Fluticasone Propionate) .Marland Kitchen.. 1-2 Sprays Each Nostril Daily 8)  Norvasc 5 Mg  Tabs (Amlodipine Besylate) .... Take 1 By Mouth Once Daily 9)  Imipramine Hcl 50 Mg Tabs (Imipramine Hcl) .... Take 1 By Mouth Once Daily  Allergies (verified): 1)  ! Codeine 2)  ! Pcn 3)  ! Reglan 4)  ! Compazine 5)  ! Claritin (Loratadine) 6)  ! Ibuprofen (Ibuprofen) 7)  ! * Strawberries  Past History:  Past Medical History: Last updated: 09/08/2008 INSOMNIA (ICD-780.52) ALLERGIC RHINITIS (ICD-477.9) ALLERGIC CONJUNCTIVITIS (ICD-372.14) G E R D (ICD-530.81) ESOPHAGEAL REFLUX (ICD-530.81) HYPERTENSION (ICD-401.9) Food allergy- angioedema- strawberries Left breast cyst- ultrasound benign 2010  Past Surgical History: Last updated: 24-Jun-2007 Tonsillectomy hysterectomy  Family History: Last updated: 2007-06-24 Father died brain tumor Mother died COPD  Social History: Last updated: 06/01/2009 Patient never smoked.  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    Vital Signs:  Patient profile:   65 year old female Height:      63 inches Weight:      194.25 pounds BMI:     34.53 O2 Sat:      99 % on Room air Pulse rate:   74 / minute BP sitting:   118 / 78  (left arm) Cuff size:   regular  Vitals  Entered By: Reynaldo Minium CMA (July 06, 2009 11:21 AM)  O2 Flow:  Room air  Physical Exam  Additional Exam:  General: A/Ox3; pleasant and cooperative, NAD, overweight, calm SKIN: no rash, lesions NODES: no lymphadenopathy HEENT: [place]/AT, EOM- WNL, Conjuctivae- clear, PERRLA, TM-cerumen, Nose- clear with clear mucus bridging, no polyps or erosions, Throat- clear and wnl, [place]   III NECK: Supple w/ fair ROM, JVD- none, normal carotid impulses w/o bruits  CHEST: Clear to P&A HEART: RRR, no m/g/r heard ABDOMEN: soft UUV:OZDG, nl pulses, no edema  NEURO: Grossly intact to observation      Impression & Recommendations:  Problem # 1:  INSOMNIA (ICD-780.52)  Issue is still whether she has a sleep apnea component. We will see about a re-trial sleep study. I will need to talk to the manager. Meanwhile we will discuss Silenor and lunesta Her updated medication list for this problem includes:    Lunesta 3 Mg Tabs (Eszopiclone) .Marland Kitchen... 1 for sleep if neede  Orders: Est. Patient Level II (64403)  Problem # 2:  ALLERGIC RHINITIS (ICD-477.9)  Her updated medication list for this problem includes:    Fluticasone Propionate 50 Mcg/act Susp (Fluticasone propionate) .Marland Kitchen... 1-2 sprays each nostril daily  Orders: Est. Patient Level II (47425)  Medications Added to Medication List This Visit: 1)  Norvasc 5 Mg Tabs (Amlodipine besylate) .... Take 1 by mouth once daily 2)  Imipramine Hcl 50 Mg Tabs (Imipramine hcl) .... Take 1 by mouth once daily  Patient Instructions: 1)  Please schedule a follow-up appointment in 2 months. 2)  I will talk with Sleep Center manager about repeating sleep study 3)  Try Silenor and Lunesta 3 mg samples.

## 2010-06-15 NOTE — Assessment & Plan Note (Signed)
Summary: rov 2 months///kp   Primary Luellen Howson/Referring Zelia Yzaguirre:  A.Little/card  CC:  2 month follow up visit-sleep(discuss issues with CPAP machine).  History of Present Illness: February 02, 2010- Allergic rhinitis, asthma, Insomnia/ OSA Acute visit. Woke with substernal burning Sept 8. By 4 days later she was coughing, chest congested. Maybe sore throat initially. Began waking herself wheezing.  Dr Elana Alm had called in Z pak then pred pack- day 9/12.. Dry hack still. Denies ears or nose. Stomach ok. She is pretty sure she didn't reflux to start this. Heat on chest feels good. Has Tussionex- helps at night. Got Ventolin inhaler- not used enough to tell if it helps.   Continues trying to make CPAP work for her.  March 14, 2010- Allergic rhinitis, asthma, Insomnia/ OSA Nurse-CC: 2 month follow up visit-Insomnia-still adjusting to CPAP machine. Has has episode of bronchitis, resolving. no fever ofr puruent sputum. needs flu shot. Discused guidance for pneumovax and she will wait.  Still trying to learn to tolerate the CPAP machine and we discussed that and alternatives again today. Pressure is still at 10. She has searched a website for an ENT practice advertising insertion of palatal pillars and we discussed that as well as nasal valves/ Provent.   May 10, 2010-  Allergic rhinitis, asthma, Insomnia/ OSA Nurse-CC: 2 month follow up visit-sleep(discuss issues with CPAP machine) Breathing is doing well with no recent colds or significant nasal congestion, cough or dyspnea. Not able to exercise as much as she wants. has had some twinges under her right scapula in last 2 days and asked significance.  She has pretty much given up on CPAP, just not able to tolerate mask. We discussed alternatives. She is very reluctant now to consider a surgica treatment for sleep apnea, especially since we don't know anyone who has had the palatal pillars implant shehad asked about. I contacted the company  prviding the Provent nasal valves again, asking for information.     Preventive Screening-Counseling & Management  Alcohol-Tobacco     Smoking Status: never  Current Medications (verified): 1)  Estrace 1 Mg  Tabs (Estradiol) .... Take 1 Tablet By Mouth Once A Day 2)  Maxzide-25 37.5-25 Mg  Tabs (Triamterene-Hctz) .... Take 1 Tablet By Mouth Once A Day 3)  Vytorin 10-20 Mg  Tabs (Ezetimibe-Simvastatin) .... Take 1 Tablet By Mouth Once A Day 4)  Aciphex 20 Mg  Tbec (Rabeprazole Sodium) .... Take 1 Tablet By Mouth Once A Day 5)  Labetalol Hcl 200 Mg  Tabs (Labetalol Hcl) .... Take 1 Tablet By Mouth Two Times A Day 6)  Clonazepam 0.5 Mg Tabs (Clonazepam) .Marland Kitchen.. 1-2 For Sleep If Needed 7)  Cpap [employer] .Marland Kitchen.. 10 Cwp 8)  Aspirin 81 Mg Tbec (Aspirin) .... Take 1 By Mouth Once Daily  Allergies (verified): 1)  ! Codeine 2)  ! Pcn 3)  ! Reglan 4)  ! Compazine 5)  ! Ibuprofen (Ibuprofen) 6)  ! * Strawberries  Past History:  Past Medical History: Last updated: 09/07/2009 INSOMNIA (ICD-780.52) Obstructive sleep apnea- NPSG 08/03/09: AHI 17.6; CPAP 12/ AHI 0; PLMA ALLERGIC RHINITIS (ICD-477.9) ALLERGIC CONJUNCTIVITIS (ICD-372.14) G E R D (ICD-530.81) ESOPHAGEAL REFLUX (ICD-530.81) HYPERTENSION (ICD-401.9) Food allergy- angioedema- strawberries Left breast cyst- ultrasound benign 2010  Past Surgical History: Last updated: 06/20/07 Tonsillectomy hysterectomy  Family History: Last updated: 2007/06/20 Father died brain tumor Mother died COPD  Social History: Last updated: 06/01/2009 Patient never smoked.  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    Vital Signs:  Patient profile:   65 year old female Height:      63 inches Weight:      189.38 pounds BMI:     33.67 O2 Sat:      97 % on Room air Pulse rate:   76 / minute BP sitting:   132 / 82  (left arm)  Vitals Entered By: Reynaldo Minium CMA (May 10, 2010 11:26 AM)  O2 Flow:  Room air CC: 2 month follow up visit-sleep(discuss issues with CPAP machine)   Physical Exam  Additional Exam:  General: A/Ox3; pleasant and cooperative, NAD, overweight, calm SKIN: no rash, lesions, flushed, clammy NODES: no lymphadenopathy HEENT: [place]/AT, EOM- WNL, Conjuctivae- clear, PERRLA, TM-cerumen, Nose- clear , Throat- clear and wnl, Mallampati  III NECK: Supple w/ fair ROM, JVD- none, normal carotid impulses w/o bruits  CHEST: Clear to P&A, no cough or wheeze while here HEART: RRR, no m/g/r heard ABDOMEN: soft OZH:YQMV, nl pulses, no edema  NEURO: Grossly intact to observation      Impression & Recommendations:  Problem # 1:  INSOMNIA WITH SLEEP APNEA UNSPECIFIED (ICD-780.51) Has failed CPAP after a lot of good faith trying, but hasn't yet turned in the machine. We discussed alternatives again. Weight loss would certainly help. The Provent nasal valves would be interesting for her to try if we can get price and subscribing information.  Problem # 2:  ALLERGIC RHINITIS (ICD-477.9) Assessment: Comment Only  Problem # 3:  BRONCHITIS (ICD-490) Lungs are very clear and she is doing well. She describes what seems to be benign musculoskeletal chest wall pain today which should resolve spontaneously. If it persists we will investigate further.   Other Orders: Est. Patient Level III (78469)  Patient Instructions: 1)  Please schedule a follow-up appointment in 2 months 2)  Need feedback from Provent company

## 2010-07-05 ENCOUNTER — Encounter: Payer: Self-pay | Admitting: Internal Medicine

## 2010-07-05 ENCOUNTER — Ambulatory Visit (INDEPENDENT_AMBULATORY_CARE_PROVIDER_SITE_OTHER): Payer: 59 | Admitting: Internal Medicine

## 2010-07-05 ENCOUNTER — Ambulatory Visit: Payer: Self-pay | Admitting: Internal Medicine

## 2010-07-05 DIAGNOSIS — G4700 Insomnia, unspecified: Secondary | ICD-10-CM

## 2010-07-05 DIAGNOSIS — J309 Allergic rhinitis, unspecified: Secondary | ICD-10-CM

## 2010-07-05 DIAGNOSIS — J40 Bronchitis, not specified as acute or chronic: Secondary | ICD-10-CM

## 2010-07-06 ENCOUNTER — Other Ambulatory Visit: Payer: Self-pay | Admitting: Dermatology

## 2010-07-11 NOTE — Assessment & Plan Note (Addendum)
Summary: 2 month rov/sh   Primary Provider/Referring Provider:  A.Little/card  CC:  2 month follow up visit-OSA and alleriges; Unable to wear CPAP face mask and is working with [employer] to fix this..  History of Present Illness:  May 10, 2010-  Allergic rhinitis, asthma, Insomnia/ OSA Nurse-CC: 2 month follow up visit-sleep(discuss issues with CPAP machine) Breathing is doing well with no recent colds or significant nasal congestion, cough or dyspnea. Not able to exercise as much as she wants. has had some twinges under her right scapula in last 2 days and asked significance.  She has pretty much given up on CPAP, just not able to tolerate mask. We discussed alternatives. She is very reluctant now to consider a surgica treatment for sleep apnea, especially since we don't know anyone who has had the palatal pillars implant she had asked about. I contacted the company providing the Provent nasal valves again, asking for information.   July 05, 2010- Allergic rhinitis, asthma, Insomnia/ OSA/ failed CPAP Nurse-CC: 2 month follow up visit-OSA and alleriges; Unable to wear CPAP face mask and is working with [employer] to fix this. She has had real trouble making CPAP work comfortably and i don't think it is a long term solution for her. We again discussed medical issues and options. She would like to proceed with trial of the Provent nasal valve device. We will order a 10 day trial through the national distributer [employer].  Asthma- denies wheeze, chest tightness or cough.  Rhinitis- not yet having seasonal pollen problems. She will use otc antihistamine if needed.       Preventive Screening-Counseling & Management  Alcohol-Tobacco     Smoking Status: never  Current Medications (verified): 1)  Estrace 1 Mg  Tabs (Estradiol) .... Take 1 Tablet By Mouth Once A Day 2)  Maxzide-25 37.5-25 Mg  Tabs (Triamterene-Hctz) .... Take 1 Tablet By Mouth Once A  Day 3)  Vytorin 10-20 Mg  Tabs (Ezetimibe-Simvastatin) .... Take 1 Tablet By Mouth Once A Day 4)  Aciphex 20 Mg  Tbec (Rabeprazole Sodium) .... Take 1 Tablet By Mouth Once A Day 5)  Labetalol Hcl 200 Mg  Tabs (Labetalol Hcl) .... Take 1 Tablet By Mouth Two Times A Day 6)  Clonazepam 0.5 Mg Tabs (Clonazepam) .Marland Kitchen.. 1-2 For Sleep If Needed 7)  Cpap [employer] .Marland Kitchen.. 10 Cwp 8)  Aspirin 81 Mg Tbec (Aspirin) .... Take 1 By Mouth Once Daily  Allergies (verified): 1)  ! Codeine 2)  ! Pcn 3)  ! Reglan 4)  ! Compazine 5)  ! Ibuprofen (Ibuprofen) 6)  ! * Strawberries  Past History:  Past Medical History: Last updated: 09/07/2009 INSOMNIA (ICD-780.52) Obstructive sleep apnea- NPSG 08/03/09: AHI 17.6; CPAP 12/ AHI 0; PLMA ALLERGIC RHINITIS (ICD-477.9) ALLERGIC CONJUNCTIVITIS (ICD-372.14) G E R D (ICD-530.81) ESOPHAGEAL REFLUX (ICD-530.81) HYPERTENSION (ICD-401.9) Food allergy- angioedema- strawberries Left breast cyst- ultrasound benign 2010  Past Surgical History: Last updated: 2007-05-29 Tonsillectomy hysterectomy  Family History: Last updated: 29-May-2007 Father died brain tumor Mother died COPD  Social History: Last updated: 06/01/2009 Patient never smoked.  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    Vital Signs:  Patient profile:   65 year old female Height:      63 inches Weight:      186 pounds BMI:     33.07 O2 Sat:      97 % on Room air Pulse  rate:   70 / minute BP sitting:   116 / 70  (left arm) Cuff size:   regular  Vitals Entered By: Reynaldo Minium CMA (July 05, 2010 11:09 AM)  O2 Flow:  Room air CC: 2 month follow up visit-OSA and alleriges; Unable to wear CPAP face mask and is working with [employer] to fix this.   Physical Exam  Additional Exam:  General: A/Ox3; pleasant and cooperative, NAD, overweight, calm SKIN: no rash, lesions, flushed, clammy NODES: no lymphadenopathy HEENT: [place]/AT, EOM- WNL, Conjuctivae- clear, PERRLA, TM-cerumen, Nose- clear , Throat- clear and wnl, Mallampati  III NECK: Supple w/ fair ROM, JVD- none, normal carotid impulses w/o bruits  CHEST: Clear to P&A, no cough or wheeze while here HEART: RRR, no m/g/r heard ABDOMEN: soft AOZ:HYQM, nl pulses, no edema  NEURO: Grossly intact to observation      Impression & Recommendations:  Problem # 1:  INSOMNIA WITH SLEEP APNEA UNSPECIFIED (ICD-780.51)  We compared options and are going to try the Provent nasal  valves. I will ask our [facility] to assist with the order.   Orders: Est. Patient Level III (57846) Misc. Referral (Misc. Ref)  Problem # 2:  INSOMNIA (ICD-780.52)  She continues with efforts at sleep hygiene, but often is awake for awhile at night.   Orders: Est. Patient Level III (96295)  Problem # 3:  ALLERGIC RHINITIS (ICD-477.9) Assessment: Comment Only  Problem # 4:  BRONCHITIS (ICD-490) Assessment: Comment Only  Patient Instructions: 1)  Please schedule a follow-up appointment in 1 month. 2)  I will send in the order to let you try a 10 day set of the Provent nasal valve device.     Appended Document: 2 month rov/sh Ordered trial of Provent nasal valves.    Preload Clinical Lists Medications added:  * PROVENT NASAL VALVES 1 over each nostril for sleep 10 day trial

## 2010-08-08 ENCOUNTER — Encounter: Payer: Self-pay | Admitting: Internal Medicine

## 2010-08-10 ENCOUNTER — Ambulatory Visit (INDEPENDENT_AMBULATORY_CARE_PROVIDER_SITE_OTHER): Payer: 59 | Admitting: Internal Medicine

## 2010-08-10 ENCOUNTER — Encounter: Payer: Self-pay | Admitting: Internal Medicine

## 2010-08-10 VITALS — BP 128/72 | HR 68 | Ht 63.0 in | Wt 187.6 lb

## 2010-08-10 DIAGNOSIS — G4700 Insomnia, unspecified: Secondary | ICD-10-CM

## 2010-08-10 DIAGNOSIS — H1045 Other chronic allergic conjunctivitis: Secondary | ICD-10-CM

## 2010-08-10 DIAGNOSIS — G4730 Sleep apnea, unspecified: Secondary | ICD-10-CM

## 2010-08-10 NOTE — Progress Notes (Signed)
  Subjective:    Patient ID: Teresa Molina, female    DOB: [birth year], 65 y.o.   MRN: [id number]  HPI 65yoF followed here for allergic rhinitis/ conjunctivitis, and for insomnia with obstructive sleep apnea.  She has not tolerated CPAP well and has been educated on alternatives. Currently she has a trial set of Provent nasal valves and is still very tentative, but trying. Her tendency is toward "busy-brain" insomnia with difficulty initiating sleep. She is also interested in trying a Silent Night oral appliance offered by her dentist.  She has not been able to lose weight.  For seasonal allergic irritation of eyes, watering and itching, she asks sample Pataday.  Review of Systems Constitutional:   No weight loss, night sweats,  Fevers, chills, fatigue, lassitude. HEENT:   Difficulty swallowing,  Tooth/dental problems,  Sore throat,                No sneezing, itching, ear ache, nasal congestion,  Some- post nasal drip,   CV:  No chest pain,  Orthopnea, PND, swelling in lower extremities, anasarca, dizziness, palpitations  GI  No heartburn, indigestion, abdominal pain, nausea, vomiting, diarrhea, change in bowel habits, loss of appetite  Resp: No shortness of breath with exertion or at rest.  No excess mucus, no productive cough,  No non-productive cough,  No coughing up of blood.  No change in color of mucus.  No wheezing.  No chest wall deformity  Skin: no rash or lesions.  GU: no dysuria, change in color of urine, no urgency or frequency.  No flank pain.  MS:  No joint pain or swelling.  No decreased range of motion.  No back pain.  Psych:  No change in mood or affect. No depression or anxiety.  No memory loss.     Objective:   Physical Exam General- Alert, Oriented, Affect-appropriate, Distress- none acute  Skin- rash-none, lesions- none, excoriation- none  Lymphadenopathy- none  Head- atraumatic  Eyes- Gross vision intact, PERRLA, conjunctivae clear, secretions-  clear  Ears- Normal- Hearing, canals, Tm L ,   R ,  Nose- Clear, Septal dev, mucus, polyps, erosion, perforation   Throat- Mallampati II , mucosa clear , drainage- none, tonsils- atrophic  Neck- flexible , trachea midline, no stridor , thyroid nl, carotid no bruit  Chest - symmetrical excursion , unlabored     Heart/CV- RRR , no murmur , no gallop  , no rub, nl s1 s2                     - JVD- none , edema- none, stasis changes- none, varices- none     Lung- clear to P&A, wheeze- none, cough- none , dullness-none, rub- none     Chest wall-   Abd- tender-no, distended-no, bowel sounds-present, HSM- no  Br/ Gen/ Rectal- Not done, not indicated  Extrem- cyanosis- none, clubbing, none, atrophy- none, strength- nl  Neuro- grossly intact to observation        Assessment & Plan:

## 2010-08-10 NOTE — Patient Instructions (Signed)
Handwritten script to continue the Provent nasal valves  Ok to try the Silent Night oral appliance.

## 2010-08-12 NOTE — Assessment & Plan Note (Addendum)
Failed CPAP-It disturbed the insomnia component making sleep initiation harder despite med trials. She can compare Provent nasal valves with her introduction to an oral appliance. Help with management of her high blood pressure is the most important driver on this issue.  Clonazepam 0.5 mg helps some with sleep initiation and consolidation.

## 2010-08-12 NOTE — Assessment & Plan Note (Signed)
Recent seasonal exacerbation. Giving sample Pataday eye drops with education.

## 2010-09-26 NOTE — Assessment & Plan Note (Signed)
[facility]                             PULMONARY OFFICE NOTE   NAME:Teresa Molina, Teresa Molina                         MRN:          [id number]  DATE:11/13/2006                            DOB:          September 08, 1945    PROBLEM LIST:  1. Allergic rhinitis.  2. Allergic conjunctivitis.  3. Hypertension (Dr. Caprice Kluver).  4. Esophageal reflux.  5. Insomnia.   HISTORY OF PRESENT ILLNESS:  A persistent bronchitis finally cleared and  she feels well from that.  She asked to try more samples of Lunesta  which did seem to help significantly with her insomnia problem taking  one-half of a 3 mg tablet p.r.n.  She found often her sleep quality  remained good for several days after taking the single dose and we took  the opportunity to discuss sleep hygiene, medication approaches and  related strategies appropriately.   OBJECTIVE:  VITAL SIGNS:  Weight 187 pounds, BP 126/78, pulse 72, room  air saturation 98%.  GENERAL:  She is very clear, looks comfortable.  HEENT:  Conjunctivae are clear.  HEART:  Sounds regular without murmur.  LUNGS:  Clear.   IMPRESSION:  1. Bronchitis, resolved.  2. Rhinitis and conjunctivitis, currently not a problem.  3. Mild nonspecific chronic insomnia.   PLAN:  She is given additional samples of Lunesta 3 mg to try with  discussion anticipating prescription, but also with reassurance related  to lifestyle and nonmedical management of insomnia.  Schedule return in  2 months at her request, earlier p.r.n.     Clinton D. Maple Hudson, MD, Tonny Bollman, FACP  Electronically Signed    CDY/MedQ  DD: 11/13/2006  DT: 11/14/2006  Job #: [id number]

## 2010-09-26 NOTE — Assessment & Plan Note (Signed)
[facility]                             PULMONARY OFFICE NOTE   NAME:Heskett, CHIMERE KLINGENSMITH                         MRN:          [id number]  DATE:09/18/2006                            DOB:          [birth year]    PROBLEM LIST:  1. Allergic rhinitis.  2. Allergic conjunctivitis.  3. Hypertension (Dr. Caprice Kluver).  4. Esophageal reflux.  5. Insomnia.   HISTORY:  The imipramine taken at bedtime has her over-dried in the  morning. During the day blames pollen for eyes burning and watering  especially when she tries to play tennis. Not sleeping at all well,  insomnia pattern, difficulty initiating and maintaining sleep. She is  trying to maintain good sleep habits, good sleep environment and  relaxation techniques.   MEDICATIONS:  1. Estrace 1 mg.  2. Maxzide 25.  3. Vytorin 10/20.  4. AcipHex 20 mg.  5. Imipramine 50 mg.  6. Sular 10 mg.  7. Labetalol 200 mg b.i.d.  8. Nasal saline rinse.   DRUG INTOLERANCES:  CODEINE, COMPAZINE, PENICILLIN, REGLAN. Strawberries  cause angioedema.   OBJECTIVE:  Mild conjunctival injection with clear secretion. Nasal  airway is not obstructed.  LUNGS:  Clear.  HEART:  Sounds regular without murmur.   IMPRESSION:  1. Allergic conjunctivitis flaring in pollen season.  2. Chronic insomnia.   PLAN:  1. Comparison samples of Optivar 1 drop each eye b.i.d. p.r.n. and      Pataday eyedrops 1 drop each eye daily. She will compare the two      and see if either helps.  2. Comparison samples of Lunesta 3 mg 1 q.h.s. with Ambien CR 12.5 mg      1 q.h.s. p.r.n. She      is scheduling return in 2 months, earlier p.r.n.  She will call for      prescriptions for either of those that are helpful.     Clinton D. Maple Hudson, MD, Tonny Bollman, FACP  Electronically Signed    CDY/MedQ  DD: 09/18/2006  DT: 09/18/2006  Job #: [id number]

## 2010-09-26 NOTE — Assessment & Plan Note (Signed)
[facility]                             PULMONARY OFFICE NOTE   NAME:Grunwald, LILYBELLE MAYEDA                         MRN:          [id number]  DATE:01/15/2007                            DOB:          02/04/1946    PROBLEM LIST:  1. Allergic rhinitis.  2. Allergic conjunctivitis.  3. Hypertension (Dr. Clarene Duke).  4. Esophageal reflux (Dr. Randa Evens).  5. Insomnia.   HISTORY:  Rhinitis has done fairly well.  Main concern recently has been  worsening gastroesophageal reflux disease.  She described distinct  episodes with abrupt wheeze that sounded as if she may have aspirated a  little.  Dr. Randa Evens now has her on Zegerid, and, hopefully, this will  help.  She takes Labetalol for her blood pressure which is of concern if  wheezing gets more persistent.   CURRENT MEDICATIONS:  1. Esterase 1 mg.  2. Maxzide 25 mg.  3. Vytorin 10/20.  4. Imipramine 50 mg.  5. Sular 10 mg.  6. Labetalol 200 mg b.i.d.  7. Amlodipine 5 mg.  8. Zegerid 40 mg.  9. Lunesta 3 mg p.r.n.   Drug intolerant to CODEINE, COMPAZINE, PENICILLIN, REGLAN.  STRAWBERRIES  have caused angioedema.   OBJECTIVE:  Weight 188 pounds, BP 114/78, pulse regular 78, room air  saturation 98%.  Nose, throat and chest are very clear now.  No neck vein distension or  stridor.  Voice quality is normal.  Heart sounds regular without murmur.   IMPRESSION:  Adequate control of rhinitis and conjunctivitis.  Biggest  concern would be active reflux and potential aspiration.  Need to watch  out for Labetalol in context of wheezing.   PLAN:  Reflux precautions.  Schedule return here 2 months.  She will  continue her Zegerid and work with Dr. Randa Evens closely on reflux  problems with discussion of the interaction.     Clinton D. Maple Hudson, MD, Tonny Bollman, FACP  Electronically Signed    CDY/MedQ  DD: 01/15/2007  DT: 01/15/2007  Job #: [id number]   cc:   Thereasa Solo. Little, M.D.  James L. Malon Kindle., M.D.

## 2010-09-26 NOTE — Assessment & Plan Note (Signed)
[facility]                             PULMONARY OFFICE NOTE   NAME:Molina, Teresa ABSHER                         MRN:          [id number]  DATE:03/26/2007                            DOB:          [birth year]    PROBLEMS:  1. Allergic rhinitis.  2. Allergic conjunctivitis.  3. Hypertension (Dr. Clarene Duke).  4. Esophageal reflux (Dr. Carman Ching).  5. Insomnia.   HISTORY:  Watery eyes and sneezing without sore throat, no chest  congestion or wheezing, nothing purulent.   MEDICATIONS:  1. Estrace 1 mg.  2. Maxzide 25.  3. Vytorin 10/20.  4. Aciphex 20 mg.  5. Imipramine 50 mg.  6. Sular 10 mg.  7. Labetalol 200 mg b.i.d.  8. Amlodipine.  9. Nasal saline spray.  10.Occasional Lunesta 3 mg.   ALLERGIES:  DRUG INTOLERANT:  CODEINE, COMPAZINE, PENICILLIN, REGLAN.  STRAWBERRIES causes angioedema in the past.   OBJECTIVE:  VITAL SIGNS:  Weight 191 pounds.  Blood pressure 120/86.  Pulse regular, 84.  Room air saturation 96%.  HEENT:  Eyes are a little watery without conjunctival injection.  Secretions are clear.  Nasal airway is wet but not obstructed.  Pharynx  is clear.  No obvious adenopathy.  CHEST:  Clear.  HEART:  Sounds regular without murmur.   IMPRESSION:  Nonspecific exacerbation of rhinitis and conjunctivitis.  It is getting a little late for seasonal allergies and this may reflect  an early viral syndrome but is similar to previous symptoms.  Supportive  care is appropriate.   PLAN:  Over the counter antihistamine p.r.n.  Flu vaccine discussed and  given.  Schedule return for two months, earlier p.r.n.     Clinton D. Maple Hudson, MD, Tonny Bollman, FACP  Electronically Signed    CDY/MedQ  DD: 03/30/2007  DT: 03/31/2007  Job #: [id number]   cc:   Thereasa Solo. Little, M.D.

## 2010-09-26 NOTE — Assessment & Plan Note (Signed)
[facility]                             PULMONARY OFFICE NOTE   NAME:Teresa Molina, Teresa Molina                         MRN:          [id number]  DATE:10/01/2006                            DOB:          06-Jan-1946    PULMONARY OFFICE FOLLOWUP   PROBLEM:  1. Allergic rhinitis.  2. Allergic conjunctivitis.  3. Hypertension (Dr. Caprice Kluver).  4. Esophageal reflux.  5. Insomnia.   HISTORY:  Four to five days of nasal congestion and bronchitis with  cough.  She had gone to an urgent care and received Medrol taper and Z-  PAK with, what sounds like a Cortizone injection.  She has not been able  to sleep taking the Medrol.  She does have some Lunesta samples that had  helped previously but did not know it was okay to try it on top of the  other medication.   MEDICATIONS:  Estrace, Maxzide 25, Vytorin 10/20, Aciphex 20 mg,  imipramine 50 mg, Sular 10 mg, labetalol 200 mg b.i.d., nasal saline  spray, drug intolerant codeine, Compazine, penicillin, Reglan.  STRAWBERRIES HAD CAUSED ANGIOEDEMA IN THE PAST.   OBJECTIVE:  Weight 182 pounds, blood pressure 136/82, pulse 80, room air  saturation 94%.  There are bilateral mild rhonchi and wheezes.  She is  somewhat hoarse.  Pharynx is not particularly red.  I do not see  drainage.  Heart sounds are normal.   IMPRESSION:  Acute bronchitis and rhinitis.  Currently on treatment.   PLAN:  Finish Z-PAK and Medrol taper.  She is given a standby  prescription for doxycycline for seven days to hold and also a standby  prescription for Diflucan 150 mg #2.  Given nebulizer treatment here.  Xopenex 1.25 mg.  Fluid and rest.  She will keep scheduled appointment,  earlier p.r.n.     Clinton D. Maple Hudson, MD, Tonny Bollman, FACP  Electronically Signed    CDY/MedQ  DD: 10/07/2006  DT: 10/07/2006  Job #: [id number]

## 2010-09-29 NOTE — Assessment & Plan Note (Signed)
[facility]                             PULMONARY OFFICE NOTE   NAME:Teresa Molina, Teresa Molina                         MRN:          [id number]  DATE:05/15/2006                            DOB:          1945-09-26    PROBLEMS:  1. Allergic rhinitis.  2. Allergic conjunctivitis.  3. Hypertension.  4. Esophageal reflux.  5. Insomnia.   HISTORY:  Rhinitis has not been bothering her much.  Eyes have burned  and watered some, but her main concern today was with her chronic  difficulty maintaining sleep.  Dr. Clarene Duke had seen her for her  hypertension and given a trial of Ativan.  She is not sure if she really  wants to take anything.  We have had some preliminary discussions of  good sleep hygiene.  She does not have difficulty initiating sleep, and  when she wakes she tends to stay awake for a while rather than tossing  and turning all night in a manner that would suggest sleep apnea or  restless legs problem.  She has not had pneumococcal vaccine and asks  about this.   MEDICATIONS:  1. Maxzide 25 mg.  2. Vytorin 10/20.  3. AcipHex 20 mg.  4. Imipramine 50 mg at bedtime.  5. Sular 10 mg.  6. Labetalol 200 mg b.i.d.  7. Veramyst nasal spray.  8. Nasal saline.   DRUG INTOLERANCES:  CODEINE, COMPAZINE, PENICILLIN, REGLAN, and  STRAWBERRIES, which cause angioedema.   OBJECTIVE:  Weight 175 pounds, BP 124/72, pulse regular 76, room air  saturation 100%.  Conjunctivae were not injected.  Nasal mucosa was clear.  No stridor.  Quiet breathing without wheeze or cough.  Regular pulse.  No tremor.   IMPRESSION:  1. Allergic rhinitis is currently not a problem for the time of year.  2. Chronic insomnia, nonspecific, waking after sleep onset.   PLAN:  1. Pneumococcal vaccine was discussed and given.  2. Sleep hygiene discussed and some discussion of available medication      assistance that      could be tried if she decides she wants it.  3. Schedule return  in 2 months, earlier p.r.n.     Clinton D. Maple Hudson, MD, Teresa Molina, FACP  Electronically Signed    CDY/MedQ  DD: 05/15/2006  DT: 05/15/2006  Job #: [id number]

## 2010-09-29 NOTE — Assessment & Plan Note (Signed)
[facility]                             PULMONARY OFFICE NOTE   NAME:Teresa Molina, Teresa Molina                         MRN:          [id number]  DATE:07/17/2006                            DOB:          Mar 07, 1946    PROBLEM:  1. Allergic rhinitis.  2. Allergic conjunctivitis.  3. Hypertension (Dr. Caprice Kluver).  4. Esophageal reflux.  5. Insomnia.   HISTORY:  One week of increasing nasal congestion, postnasal drainage,  some watering of the eyes and nose.  No cough or wheeze.  No fever or  sore throat.  Nothing purulent.   MEDICATIONS:  1. Estrace 1 mg.  2. Maxzide-25.  3. Vytorin 10/20.  4. AcipHex 20 mg.  5. Imipramine 50 mg.  6. Sular 10 mg.  7. Labetalol 200 mg b.i.d.  8. Veramyst nasal spray once daily.  9. Nasal saline spray.   DRUG INTOLERANT TO:  1. CODEINE.  2. COMPAZINE.  3. REGLAN.  4. STRAWBERRIES CAUSE ANGIOEDEMA.   OBJECTIVE:  Weight 179 pounds.  BP 122/62.  Pulse regular at 75.  Room  air saturation 97%.  Conjunctivae are watery without injection.  Mild nasal congestion.  No cervical adenopathy.  Quite chest.  Normal heart sounds.   IMPRESSION:  This is probably a seasonal allergic rhinitis flare given  the timing, but viral syndrome is not excluded.   PLAN:  She has Clarinex, but will try substituting samples of Xyzal 5 mg  p.r.n. as an antihistamine to see which works best for her.  Depending  on the trend, we will call in additional therapy as needed.  Scheduling  return in 2 months, earlier p.r.n.     Clinton D. Maple Hudson, MD, Tonny Bollman, FACP  Electronically Signed    CDY/MedQ  DD: 07/17/2006  DT: 07/17/2006  Job #: [id number]

## 2010-09-29 NOTE — Assessment & Plan Note (Signed)
[facility]                               PULMONARY OFFICE NOTE   NAME:Muckey, NEDDIE STEEDMAN                         MRN:          [id number]  DATE:01/30/2006                            DOB:          01-02-1946    PROBLEM:  1. Allergic rhinitis.  2. Allergic conjunctivitis.  3. Hypertension.  4. Esophageal reflux.   HISTORY:  Nasal congestion and watery rhinorrhea in the last few weeks  without much sneezing.  She asks about a nasal inhaler since she has not had  any in quite awhile.  No headache, no blood, no chest problems.   MEDICATIONS:  Estrace 0.5 mg, Maxzide, Vytorin, AcipHex 20 mg, Imipramine 25  mg, nasal saline.   DRUG INTOLERANCE:  CODEINE, COMPAZINE, PENICILLIN, REGLAN.  STRAWBERRIES  have caused angioedema.   OBJECTIVE:  VITAL SIGNS:  Weight 173 pounds, blood pressure 126/88, pulse  rate 94, room air saturation 94%.  HEENT:  Conjunctivae are not injected.  Nasal mucosa is wet.  Oropharynx  clear.  No neck vein distention.  Voice quality is normal.  LUNGS:  Clear, breathing is unlabored.  HEART:  Sounds are regular.   IMPRESSION:  Exacerbation of allergic rhinitis.   PLAN:  Sample and prescription Veramyst 1-2 sprays each nostril daily.  Schedule return in six weeks, earlier p.r.n.                                   Clinton D. Maple Hudson, MD, FCCP, FACP   CDY/MedQ  DD:  01/30/2006  DT:  01/31/2006  Job #:  [id number]

## 2010-09-29 NOTE — Assessment & Plan Note (Signed)
[facility]                               PULMONARY OFFICE NOTE   NAME:Teresa Molina, Teresa Molina                         MRN:          [id number]  DATE:03/27/2006                            DOB:          17-Jul-1945    PROBLEM LIST:  1. Allergic rhinitis.  2. Allergic conjunctivitis.  3. Hypertension.  4. Esophageal reflux.   HISTORY OF PRESENT ILLNESS:  The patient reports being very pleased with  Veramyst, which we had given as a sample and 1 time prescription at last  visit. It cleared her head better than any of the other cortisone  medications that she has tried. We discussed long term use. She does have  occasional minor epistaxis, which has not gotten any worse. No chest  tightness or wheeze. No significant heart burn. No palpitation. Wants a flu  shot. She asked for discussion of pneumococcal vaccine but wanted to wait on  that.   MEDICATIONS:  Estrace 1 mg, Maxzide 25, Vytorin 10/20, Aciphex 20 mg,  Imipramine 50 mg, Sular 10 mg, Labetalol 200 mg b.i.d., nasal saline,  Veramyst.   ALLERGIES:  CODEINE, COMPAZINE, PENICILLIN, REGLAN, STRAWBERRIES (cause  angio-edema.)   OBJECTIVE:  VITAL SIGNS:  Weight 174 pounds, blood pressure 112/78, pulse  regular at 78. Room air saturation 98%.  HEENT:  Conjunctivae clear. Nasal mucosa is normal looking, not edematous.  Voice quality is normal.  LUNGS:  Breathing is unlabored and clear.  HEART:  Sounds regular without murmur.   IMPRESSION:  Allergic rhinitis, under better control.   PLAN:  1. Refillable prescription for Veramyst, brand preferred.  2. Flu vaccine was discussed and given.  3. Return in 6 weeks, earlier p.r.n. She wants to discuss pneumococcal      vaccine at that time.     Clinton D. Maple Hudson, MD, Tonny Bollman, FACP  Electronically Signed    CDY/MedQ  DD: 03/27/2006  DT: 03/27/2006  Job #: [id number]

## 2010-10-11 ENCOUNTER — Encounter: Payer: Self-pay | Admitting: Internal Medicine

## 2010-10-11 ENCOUNTER — Ambulatory Visit (INDEPENDENT_AMBULATORY_CARE_PROVIDER_SITE_OTHER): Payer: 59 | Admitting: Internal Medicine

## 2010-10-11 VITALS — BP 130/84 | HR 74 | Ht 63.0 in | Wt 188.0 lb

## 2010-10-11 DIAGNOSIS — G4730 Sleep apnea, unspecified: Secondary | ICD-10-CM

## 2010-10-11 DIAGNOSIS — J309 Allergic rhinitis, unspecified: Secondary | ICD-10-CM

## 2010-10-11 DIAGNOSIS — H1045 Other chronic allergic conjunctivitis: Secondary | ICD-10-CM

## 2010-10-11 DIAGNOSIS — G4700 Insomnia, unspecified: Secondary | ICD-10-CM

## 2010-10-11 NOTE — Progress Notes (Signed)
Subjective:    Patient ID: Teresa Molina, female    DOB: 1945/09/13, 65 y.o.   MRN: [id number]  HPI    Review of Systems     Objective:   Physical Exam        Assessment & Plan:   Subjective:    Patient ID: Teresa Molina, female    DOB: 07-29-1945, 65 y.o.   MRN: [id number]  HPI - 65yoF followed here for allergic rhinitis/ conjunctivitis, and for insomnia with obstructive sleep apnea.  She has not tolerated CPAP well and has been educated on alternatives. Currently she has a trial set of Provent nasal valves and is still very tentative, but trying. Her tendency is toward "busy-brain" insomnia with difficulty initiating sleep. She is also interested in trying a Silent Night oral appliance offered by her dentist.  She has not been able to lose weight.  For seasonal allergic irritation of eyes, watering and itching, she asks sample Pataday.  10/11/10- OSA, insomnia, allergic rhinitis Provent didn't work out well and she still felt she couldn't sleep throught the night. She is going to look into an oral appliance with her dentist.  Weight loss and sleep hygiene goals reviewed. Spring associated nasal congestion and watery eyes are getting better.  Review of Systems Constitutional:   No weight loss, night sweats,  Fevers, chills, fatigue, lassitude. HEENT:   Difficulty swallowing,  Tooth/dental problems,  Sore throat,                No sneezing, itching, ear ache, nasal congestion,                    Some- post nasal drip,   CV:  No chest pain, Orthopnea, PND, swelling in lower extremities, anasarca, dizziness, palpitations  GI  No heartburn, indigestion, abdominal pain, nausea, vomiting, diarrhea, change in bowel habits, loss of appetite  Resp: No shortness of breath with exertion or at rest.  No excess mucus, no productive cough,  No non-productive cough,  No coughing up of blood.  No change in color of mucus.  No wheezing.    Skin: no rash or lesions.  GU: no dysuria,  change in color of urine, no urgency or frequency.  No flank pain.  MS:  No joint pain or swelling.  No decreased range of motion.  No back pain.  Psych:  No change in mood or affect. No depression or anxiety.  No memory loss.     Objective:   Physical Exam General- Alert, Oriented, Affect-appropriate, Distress- none acute  Skin- rash-none, lesions- none, excoriation- none  Lymphadenopathy- none  Head- atraumatic  Eyes- Gross vision intact, PERRLA, conjunctivae clear, secretions- clear  Ears- Normal- Hearing, canals, Tm  Nose- Clear, No-Septal dev, mucus, polyps, erosion, perforation   Throat- Mallampati II , mucosa clear , drainage- none, tonsils- atrophic  Neck- flexible , trachea midline, no stridor , thyroid nl, carotid no bruit  Chest - symmetrical excursion , unlabored     Heart/CV- RRR , no murmur , no gallop  , no rub, nl s1 s2                     - JVD- none , edema- none, stasis changes- none, varices- none     Lung- clear to P&A, wheeze- none, cough- none , dullness-none, rub- none     Chest wall-   Abd- tender-no, distended-no, bowel sounds-present, HSM- no  Br/ Gen/ Rectal- Not done, not indicated  Extrem- cyanosis- none, clubbing, none, atrophy- none, strength- nl  Neuro- grossly intact to observation        Assessment & Plan:

## 2010-10-11 NOTE — Assessment & Plan Note (Signed)
Wil wait to see how she does with the oral appliance.

## 2010-10-14 ENCOUNTER — Encounter: Payer: Self-pay | Admitting: Internal Medicine

## 2010-10-14 NOTE — Patient Instructions (Signed)
We will be interested in seeing how the oral appliance works for you.

## 2010-10-14 NOTE — Assessment & Plan Note (Signed)
Occsional antihistamine now should be sufficient

## 2010-10-14 NOTE — Assessment & Plan Note (Signed)
Has Pataday when needed

## 2010-12-13 ENCOUNTER — Encounter: Payer: Self-pay | Admitting: Internal Medicine

## 2010-12-13 ENCOUNTER — Ambulatory Visit (INDEPENDENT_AMBULATORY_CARE_PROVIDER_SITE_OTHER): Payer: 59 | Admitting: Internal Medicine

## 2010-12-13 VITALS — BP 110/72 | HR 77 | Ht 63.0 in | Wt 178.8 lb

## 2010-12-13 DIAGNOSIS — G4730 Sleep apnea, unspecified: Secondary | ICD-10-CM

## 2010-12-13 DIAGNOSIS — G4700 Insomnia, unspecified: Secondary | ICD-10-CM

## 2010-12-13 NOTE — Patient Instructions (Signed)
Sample Intermezzo as needed if wake and unable to get back to sleep  Ok to continue clonazepam

## 2010-12-13 NOTE — Progress Notes (Signed)
Subjective:    Patient ID: Teresa Molina, female    DOB: Jan 03, 1946, 65 y.o.   MRN: [id number]  HPI     Review of Systems      Objective:   Physical Exam         Assessment & Plan:   Subjective:    Patient ID: Teresa Molina, female    DOB: March 08, 1946, 65 y.o.   MRN: [id number]  HPI - [name] followed here for allergic rhinitis/ conjunctivitis, and for insomnia with obstructive sleep apnea.  She has not tolerated CPAP well and has been educated on alternatives. Currently she has a trial set of Provent nasal valves and is still very tentative, but trying. Her tendency is toward "busy-brain" insomnia with difficulty initiating sleep. She is also interested in trying a Silent Night oral appliance offered by her dentist.  She has not been able to lose weight.  For seasonal allergic irritation of eyes, watering and itching, she asks sample Pataday.  10/11/10- OSA, insomnia, allergic rhinitis Provent didn't work out well and she still felt she couldn't sleep throught the night. She is going to look into an oral appliance with her dentist.  Weight loss and sleep hygiene goals reviewed. Spring associated nasal congestion and watery eyes are getting better.  12/13/10- [name] followed here for allergic rhinitis/ conjunctivitis, and for insomnia with obstructive sleep apnea, complicated by history of migraine, hypertension, GERD. She follows with Dr. Clarene Duke for blood pressure management. He is evaluating recent description of aura with scheduled carotid Dopplers. Her primary sleep complaint remains difficulty initiating and maintaining sleep/insomnia. Obstructive sleep apnea has been identified and we have worked on it persistently. Unfortunately she has not tolerated CPAP or Provent nasal valves, and she has not been able to lose weight. Her dentist is interested in trying to make an oral appliance but apparently has limited experience with this. I have offered to send her to someone who has  done a lot more of this kind of therapy. Restless legs have been identified in the past but does not seem to be a major clinical problem. Allergic rhinitis and conjunctivitis are managed symptomatically. Most recently she had Pataday.  Review of Systems Constitutional:   No weight loss, night sweats,  Fevers, chills, fatigue, lassitude. HEENT:   Difficulty swallowing,  Tooth/dental problems,  Sore throat,                No sneezing, itching, ear ache, nasal congestion,                    Some- post nasal drip,   CV:  No chest pain, Orthopnea, PND, swelling in lower extremities, anasarca, dizziness, palpitations  GI  No heartburn, indigestion, abdominal pain, nausea, vomiting, diarrhea, change in bowel habits, loss of appetite  Resp: No shortness of breath with exertion or at rest.  No excess mucus, no productive cough,  No non-productive cough,  No coughing up of blood.  No change in color of mucus.  No wheezing.    Skin: no rash or lesions.  GU: no dysuria, change in color of urine, no urgency or frequency.  No flank pain.  MS:  No joint pain or swelling.  No decreased range of motion.  No back pain.  Psych:  No change in mood or affect. No depression or anxiety.  No memory loss.     Objective:   Physical Exam  General- Alert, Oriented, Affect-appropriate, Distress- none acute Overweight Skin- rash-none, lesions- none,  excoriation- none Lymphadenopathy- none Head- atraumatic            Eyes- Gross vision intact, PERRLA, conjunctivae clear secretions            Ears- Hearing, canals normal            Nose- Clear, no -Septal dev, mucus, polyps, erosion, perforation             Throat- Mallampati II , mucosa clear , drainage- none, tonsils- atrophic Neck- flexible , trachea midline, no stridor , thyroid nl, carotid no bruit Chest - symmetrical excursion , unlabored           Heart/CV- RRR , no murmur , no gallop  , no rub, nl s1 s2                           - JVD- none , edema-  none, stasis changes- none, varices- none           Lung- clear to P&A, wheeze- none, cough- none , dullness-none, rub- none           Chest wall-  Abd- tender-no, distended-no, bowel sounds-present, HSM- no Br/ Gen/ Rectal- Not done, not indicated Extrem- cyanosis- none, clubbing, none, atrophy- none, strength- nl Neuro- grossly intact to observation            Assessment & Plan:

## 2010-12-13 NOTE — Assessment & Plan Note (Signed)
Try Intermezzo sample Continue clonazepam

## 2010-12-16 NOTE — Assessment & Plan Note (Addendum)
She has not liked temazepam, clonazepam,ambien or lunesta. We are trying samples of Intermezzo.

## 2011-02-21 ENCOUNTER — Ambulatory Visit (INDEPENDENT_AMBULATORY_CARE_PROVIDER_SITE_OTHER): Payer: BC Managed Care – PPO | Admitting: Internal Medicine

## 2011-02-21 ENCOUNTER — Encounter: Payer: Self-pay | Admitting: Internal Medicine

## 2011-02-21 VITALS — BP 122/76 | HR 71 | Ht 63.0 in | Wt 183.8 lb

## 2011-02-21 DIAGNOSIS — Z23 Encounter for immunization: Secondary | ICD-10-CM

## 2011-02-21 DIAGNOSIS — J309 Allergic rhinitis, unspecified: Secondary | ICD-10-CM

## 2011-02-21 DIAGNOSIS — G4730 Sleep apnea, unspecified: Secondary | ICD-10-CM

## 2011-02-21 DIAGNOSIS — G4700 Insomnia, unspecified: Secondary | ICD-10-CM

## 2011-02-21 NOTE — Patient Instructions (Addendum)
Pataday sample 1 drop each eye daily as needed  Flu vax

## 2011-02-21 NOTE — Progress Notes (Signed)
Patient ID: Teresa Molina, female    DOB: 12-13-1945, 65 y.o.   MRN: [id number]  HPI - [name] followed here for allergic rhinitis/ conjunctivitis, and for insomnia with obstructive sleep apnea.  She has not tolerated CPAP well and has been educated on alternatives. Currently she has a trial set of Provent nasal valves and is still very tentative, but trying. Her tendency is toward "busy-brain" insomnia with difficulty initiating sleep. She is also interested in trying a Silent Night oral appliance offered by her dentist.  She has not been able to lose weight.  For seasonal allergic irritation of eyes, watering and itching, she asks sample Pataday.  10/11/10- OSA, insomnia, allergic rhinitis Provent didn't work out well and she still felt she couldn't sleep throught the night. She is going to look into an oral appliance with her dentist.  Weight loss and sleep hygiene goals reviewed. Spring associated nasal congestion and watery eyes are getting better.  12/13/10- [name] followed here for allergic rhinitis/ conjunctivitis, and for insomnia with obstructive sleep apnea, complicated by history of migraine, hypertension, GERD. She follows with Dr. Clarene Duke for blood pressure management. He is evaluating recent description of aura with scheduled carotid Dopplers. Her primary sleep complaint remains difficulty initiating and maintaining sleep/insomnia. Obstructive sleep apnea has been identified and we have worked on it persistently. Unfortunately she has not tolerated CPAP or Provent nasal valves, and she has not been able to lose weight. Her dentist is interested in trying to make an oral appliance but apparently has limited experience with this. I have offered to send her to someone who has done a lot more of this kind of therapy. Restless legs have been identified in the past but does not seem to be a major clinical problem. Allergic rhinitis and conjunctivitis are managed symptomatically. Most recently she  had Pataday.  02/21/11- [name] followed here for allergic rhinitis/ conjunctivitis, and for insomnia with obstructive sleep apnea, complicated by history of migraine, hypertension, GERD. Since last here in August she has had no more paresthesias. She has given up on CPAP and Provent. nasal valves. Insomnia remains her primary sleep complaint. We have discussed this repeatedly and tried a number of approaches. Basic sleep hygiene is good. Fortunately workup for abnormal breast exam turned out benign. In the last 2-3 weeks she feels fall seasonal allergies bothering her. Eyes itch, water. Ears itch. She has a sore inside her right nostril from blowing her nose a lot. Clear mucus discharge. Denies chest tightness or wheeze.   Review of Systems Constitutional:   No-   weight loss, night sweats, fevers, chills, fatigue, lassitude. + insomnia HEENT:   No-  headaches, difficulty swallowing, tooth/dental problems, sore throat,       +sneezing, itching, ears itch, nasal congestion, post nasal drip,  CV:  No-   chest pain, orthopnea, PND, swelling in lower extremities, anasarca, dizziness, palpitations Resp: No-   shortness of breath with exertion or at rest.              No-   productive cough,  No non-productive cough,  No-  coughing up of blood.              No-   change in color of mucus.  No- wheezing.   Skin: No-   rash or lesions. GI:  No-   heartburn, indigestion, abdominal pain, nausea, vomiting, diarrhea,                 change in  bowel habits, loss of appetite GU: No-   dysuria, change in color of urine, no urgency or frequency.  No- flank pain. MS:  No-   joint pain or swelling.  No- decreased range of motion.  No- back pain. Neuro- :  Psych:  No- change in mood or affect. Some- depression or anxiety.  No memory loss.        Physical Exam  General- Alert, Oriented, Affect-appropriate, Distress- none acute Overweight Skin- rash-none, lesions- none, excoriation- none Lymphadenopathy-  none Head- atraumatic            Eyes- Gross vision intact, PERRLA, conjunctivae clear secretions            Ears- Hearing, canals normal            Nose- small pimple inside right nostril at 10:00 o'clock, no -Septal dev, mucus, polyps, erosion, perforation             Throat- Mallampati III , mucosa clear , drainage- none, tonsils- atrophic Neck- flexible , trachea midline, no stridor , thyroid nl, carotid no bruit Chest - symmetrical excursion , unlabored           Heart/CV- RRR , no murmur , no gallop  , no rub, nl s1 s2                           - JVD- none , edema- none, stasis changes- none, varices- none           Lung- clear to P&A, wheeze- none, cough- none , dullness-none, rub- none           Chest wall-  Abd- tender-no, distended-no, bowel sounds-present, HSM- no Br/ Gen/ Rectal- Not done, not indicated Extrem- cyanosis- none, clubbing, none, atrophy- none, strength- nl Neuro- grossly intact to observation

## 2011-02-23 NOTE — Assessment & Plan Note (Signed)
Seasonal allergic rhinitis. Incidental temple and right nostril-treat with Neosporin Plan- Pataday Gently rinse external ear canals Antihistamine of choice

## 2011-02-23 NOTE — Assessment & Plan Note (Signed)
Insomnia is a bigger problem than the apnea component. Weight loss and regular exercise are encouraged.

## 2011-05-02 ENCOUNTER — Encounter: Payer: Self-pay | Admitting: Internal Medicine

## 2011-05-02 ENCOUNTER — Ambulatory Visit (INDEPENDENT_AMBULATORY_CARE_PROVIDER_SITE_OTHER): Payer: Medicare Other | Admitting: Internal Medicine

## 2011-05-02 VITALS — BP 120/60 | HR 72 | Ht 63.0 in | Wt 180.2 lb

## 2011-05-02 DIAGNOSIS — G4700 Insomnia, unspecified: Secondary | ICD-10-CM

## 2011-05-02 DIAGNOSIS — H1045 Other chronic allergic conjunctivitis: Secondary | ICD-10-CM

## 2011-05-02 NOTE — Patient Instructions (Signed)
Please call as needed 

## 2011-05-02 NOTE — Progress Notes (Signed)
Patient ID: Teresa Molina, female    DOB: 1946/03/15, 65 y.o.   MRN: [id number]  HPI - [name] followed here for allergic rhinitis/ conjunctivitis, and for insomnia with obstructive sleep apnea.  She has not tolerated CPAP well and has been educated on alternatives. Currently she has a trial set of Provent nasal valves and is still very tentative, but trying. Her tendency is toward "busy-brain" insomnia with difficulty initiating sleep. She is also interested in trying a Silent Night oral appliance offered by her dentist.  She has not been able to lose weight.  For seasonal allergic irritation of eyes, watering and itching, she asks sample Pataday.  10/11/10- OSA, insomnia, allergic rhinitis Provent didn't work out well and she still felt she couldn't sleep throught the night. She is going to look into an oral appliance with her dentist.  Weight loss and sleep hygiene goals reviewed. Spring associated nasal congestion and watery eyes are getting better.  12/13/10- [name] followed here for allergic rhinitis/ conjunctivitis, and for insomnia with obstructive sleep apnea, complicated by history of migraine, hypertension, GERD. She follows with Dr. Clarene Duke for blood pressure management. He is evaluating recent description of aura with scheduled carotid Dopplers. Her primary sleep complaint remains difficulty initiating and maintaining sleep/insomnia. Obstructive sleep apnea has been identified and we have worked on it persistently. Unfortunately she has not tolerated CPAP or Provent nasal valves, and she has not been able to lose weight. Her dentist is interested in trying to make an oral appliance but apparently has limited experience with this. I have offered to send her to someone who has done a lot more of this kind of therapy. Restless legs have been identified in the past but does not seem to be a major clinical problem. Allergic rhinitis and conjunctivitis are managed symptomatically. Most recently she  had Pataday.  02/21/11- [name] followed here for allergic rhinitis/ conjunctivitis, and for insomnia with obstructive sleep apnea, complicated by history of migraine, hypertension, GERD. Since last here in August she has had no more paresthesias. She has given up on CPAP and Provent. nasal valves. Insomnia remains her primary sleep complaint. We have discussed this repeatedly and tried a number of approaches. Basic sleep hygiene is good. Fortunately workup for abnormal breast exam turned out benign. In the last 2-3 weeks she feels fall seasonal allergies bothering her. Eyes itch, water. Ears itch. She has a sore inside her right nostril from blowing her nose a lot. Clear mucus discharge. Denies chest tightness or wheeze.  05/02/11- [name] followed here for allergic rhinitis/ conjunctivitis, and for insomnia with obstructive sleep apnea, complicated by history of migraine, hypertension, GERD. Using clonazepam when needed for her insomnia. Failed to tolerate CPAP with multiple efforts, and the Provent nasal valves. Unable to lose weight. Lives alone with no reports of snoring or apnea. Did not get a dental oral device. Eyes have watered some, with little itch, sneeze or nasal congestion. Denies wheeze or cough.   Review of Systems Constitutional:   No-   weight loss, night sweats, fevers, chills, fatigue, lassitude. + insomnia HEENT:   No-  headaches, difficulty swallowing, tooth/dental problems, sore throat,       No sneezing, itching, ears itch, nasal congestion, post nasal drip,  CV:  No-   chest pain, orthopnea, PND, swelling in lower extremities, anasarca, dizziness, palpitations Resp: No-   shortness of breath with exertion or at rest.              No-  productive cough,  No non-productive cough,  No-  coughing up of blood.              No-   change in color of mucus.  No- wheezing.   Skin: No-   rash or lesions. GI:  No-   heartburn, indigestion, abdominal pain, nausea, vomiting, diarrhea,                   change in bowel habits, loss of appetite GU: MS:  No-   joint pain or swelling.  No- decreased range of motion.  No- back pain. Neuro- :  Psych:  No- change in mood or affect. Some- depression or anxiety.  No memory loss.   Physical Exam  General- Alert, Oriented, Affect-appropriate, very pleasant, Distress- none acute Overweight Skin- rash-none, lesions- none, excoriation- none Lymphadenopathy- none Head- atraumatic            Eyes- Gross vision intact, PERRLA, conjunctivae clear secretions            Ears- Hearing, canals normal            Nose- , no -Septal dev, mucus, polyps, erosion, perforation             Throat- Mallampati III , mucosa clear , drainage- none, tonsils- atrophic Neck- flexible , trachea midline, no stridor , thyroid nl, carotid no bruit Chest - symmetrical excursion , unlabored           Heart/CV- RRR , no murmur , no gallop  , no rub, nl s1 s2                           - JVD- none , edema- none, stasis changes- none, varices- none           Lung- clear to P&A, wheeze- none, cough- none , dullness-none, rub- none           Chest wall-  Abd- tender-no,  Br/ Gen/ Rectal- Not done, not indicated Extrem- cyanosis- none, clubbing, none, atrophy- none, strength- nl Neuro- grossly intact to observation

## 2011-05-06 NOTE — Assessment & Plan Note (Signed)
Encourage weight loss She has considered an oral appliance Uses clonazepam for sleep help if needed, but accepts being a poor sleeper. Educated on sleep hygiene and had been educated on CBT for insomnia.

## 2011-05-06 NOTE — Assessment & Plan Note (Signed)
Has used topical antihistamine drops when needed.

## 2011-05-15 HISTORY — PX: BREAST SURGERY: SHX581

## 2011-06-06 ENCOUNTER — Telehealth: Payer: Self-pay | Admitting: Internal Medicine

## 2011-06-06 MED ORDER — OSELTAMIVIR PHOSPHATE 75 MG PO CAPS: 75.0000 mg | ORAL_CAPSULE | Freq: Two times a day (BID) | ORAL | Status: AC

## 2011-06-06 NOTE — Telephone Encounter (Signed)
Called reporting flu-like illness but w/ nausea and some sweating. Had discussed tamiflu earlier and will send this, but watch the nausea aspect.

## 2011-06-19 ENCOUNTER — Ambulatory Visit (INDEPENDENT_AMBULATORY_CARE_PROVIDER_SITE_OTHER): Payer: Medicare Other | Admitting: Gynecology

## 2011-06-19 ENCOUNTER — Encounter: Payer: Self-pay | Admitting: Gynecology

## 2011-06-19 VITALS — BP 130/74 | Ht 62.75 in | Wt 179.0 lb

## 2011-06-19 DIAGNOSIS — Z780 Asymptomatic menopausal state: Secondary | ICD-10-CM

## 2011-06-19 DIAGNOSIS — N952 Postmenopausal atrophic vaginitis: Secondary | ICD-10-CM

## 2011-06-19 DIAGNOSIS — N63 Unspecified lump in unspecified breast: Secondary | ICD-10-CM

## 2011-06-19 DIAGNOSIS — Z79890 Hormone replacement therapy (postmenopausal): Secondary | ICD-10-CM

## 2011-06-19 MED ORDER — ESTRADIOL 0.5 MG PO TABS: 0.5000 mg | ORAL_TABLET | Freq: Every day | ORAL | Status: DC

## 2011-06-19 NOTE — Progress Notes (Signed)
Teresa Molina 1945-06-05 [id number]        66 y.o.  New patient on Estrace 1 mg daily complaining of a persistent right breast lump over the past year. She saw Dr. Yolanda Bonine had her mammogram and ultrasound was reassured that this was consistent with a benign etiology but states it seems to be getting bigger and is bothersome and sore.  Past medical history,surgical history, medications, allergies, family history and social history were all reviewed and documented in the EPIC chart. ROS:  Was performed and pertinent positives and negatives are included in the history.  Exam: Sherri chaperone present Filed Vitals:   06/19/11 1145  BP: 130/74   General appearance  Normal Skin grossly normal Head/Neck normal with no cervical or supraclavicular adenopathy thyroid normal Lungs  clear Cardiac RR, without RMG Abdominal  soft, nontender, without masses, organomegaly or hernia Breasts  examined lying and sitting. Left without masses, retractions, discharge or axillary adenopathy.  Right with small pea-sized nodule at the edge of the areola 5:00 position freely mobile no overlying skin changes. No nipple discharge other masses or axillary adenopathy Pelvic  Ext/BUS/vagina  normal with atrophic changes  Adnexa  Without masses or tenderness    Anus and perineum  normal   Rectovaginal  normal sphincter tone without palpated masses or tenderness.    Assessment/Plan:  66 y.o. female with the following issues.    1. Right breast lump. Feels like a small subcutaneous cyst possible Montgomery tubercle. Patient notes it does seem to be getting bigger and is tender and she wants to have it excised which I think is reasonable. We'll make an appointment for her to see general surgery and schedule follow up with them. 2. ERT. Patients on Estrace 1 mg daily. She tried stopping it this year and had headaches and restarted it.  I reviewed the whole issue of ERT with her, the WHI study to include increased risk of  stroke heart attack DVT as well as possible increased risk of breast cancer. The ACOG and NAMS statements the lowest dose for shortest period of time. Advantage of transdermal versus oral such as far as first pass effect and decreasing thrombosis risk.  After lengthy discussion I prescribed her 0.5 mg Estrace and she will start this and then wean herself off this winter and see how she does. If she would have significant symptoms after stopping it she accepts above risks she will reinitiate. If she does well off of ERT and she will stay off of it. 3. Bone health. Patient's never had a DEXA and I went ahead and scheduled a baseline DEXA. Increase calcium vitamin D reviewed. 4. Mammography. Patients up to date with her mammogram having done in September 2012. She'll continue with annual mammography. SBE monthly reviewed. 5. Pap smear. Her last Pap smear was April 2012. I did not do a Pap smear now. I reviewed current screening guidelines. She is status post hysterectomy and a 65 and we can stop doing them although she feels uncomfortable with this and we may elect for a less frequent screening interval. We'll readdress this annually. 6. Colonoscopy. Patient is due for colonoscopy 2014 at a 5 year interval as they found polyps on her last colonoscopy. 7. Health maintenance. No blood work was done today as it's all done through Dr. Fredirick Maudlin office who is her primary physician.   Dara Lords MD, 12:34 PM 06/19/2011

## 2011-06-19 NOTE — Patient Instructions (Signed)
Follow up for DEXA bone density study. Office will contact you with appointment for general surgeon to excise breast mass. Follow up with me in one year.

## 2011-06-21 ENCOUNTER — Telehealth: Payer: Self-pay | Admitting: *Deleted

## 2011-06-21 ENCOUNTER — Other Ambulatory Visit: Payer: Self-pay | Admitting: *Deleted

## 2011-06-21 DIAGNOSIS — N6310 Unspecified lump in the right breast, unspecified quadrant: Secondary | ICD-10-CM

## 2011-06-21 NOTE — Telephone Encounter (Signed)
Message copied by Libby Maw on Thu Jun 21, 2011  4:21 PM ------      Message from: Dara Lords      Created: Tue Jun 19, 2011 12:50 PM       Schedule appointment with general surgery reference small right breast mass pt wants excised. Lower areola.

## 2011-06-21 NOTE — Telephone Encounter (Signed)
Patient informed appt set up with Dr. Corliss Skains on 07/05/11 @ 10:20.  Will need to get films from [facility] and have sent prior to appt.

## 2011-06-28 ENCOUNTER — Ambulatory Visit (INDEPENDENT_AMBULATORY_CARE_PROVIDER_SITE_OTHER): Payer: Medicare Other

## 2011-06-28 DIAGNOSIS — Z780 Asymptomatic menopausal state: Secondary | ICD-10-CM

## 2011-06-28 DIAGNOSIS — Z13820 Encounter for screening for osteoporosis: Secondary | ICD-10-CM

## 2011-07-04 ENCOUNTER — Encounter: Payer: Self-pay | Admitting: Internal Medicine

## 2011-07-04 ENCOUNTER — Ambulatory Visit (INDEPENDENT_AMBULATORY_CARE_PROVIDER_SITE_OTHER): Payer: Medicare Other | Admitting: Internal Medicine

## 2011-07-04 VITALS — BP 120/72 | HR 86 | Ht 63.0 in | Wt 181.4 lb

## 2011-07-04 DIAGNOSIS — G2581 Restless legs syndrome: Secondary | ICD-10-CM

## 2011-07-04 DIAGNOSIS — G4730 Sleep apnea, unspecified: Secondary | ICD-10-CM

## 2011-07-04 DIAGNOSIS — J309 Allergic rhinitis, unspecified: Secondary | ICD-10-CM

## 2011-07-04 DIAGNOSIS — G4700 Insomnia, unspecified: Secondary | ICD-10-CM

## 2011-07-04 MED ORDER — CLONAZEPAM 0.5 MG PO TABS: 0.2500 mg | ORAL_TABLET | ORAL | Status: DC

## 2011-07-04 NOTE — Patient Instructions (Signed)
Clonazepam rewritten for the 0.25 mg strength

## 2011-07-04 NOTE — Progress Notes (Signed)
Patient ID: Teresa Molina, female    DOB: December 18, 1945, 66 y.o.   MRN: [id number]  HPI - [name] followed here for allergic rhinitis/ conjunctivitis, and for insomnia with obstructive sleep apnea.  She has not tolerated CPAP well and has been educated on alternatives. Currently she has a trial set of Provent nasal valves and is still very tentative, but trying. Her tendency is toward "busy-brain" insomnia with difficulty initiating sleep. She is also interested in trying a Silent Night oral appliance offered by her dentist.  She has not been able to lose weight.  For seasonal allergic irritation of eyes, watering and itching, she asks sample Pataday.  10/11/10- OSA, insomnia, allergic rhinitis Provent didn't work out well and she still felt she couldn't sleep throught the night. She is going to look into an oral appliance with her dentist.  Weight loss and sleep hygiene goals reviewed. Spring associated nasal congestion and watery eyes are getting better.  12/13/10- [name] followed here for allergic rhinitis/ conjunctivitis, and for insomnia with obstructive sleep apnea, complicated by history of migraine, hypertension, GERD. She follows with Dr. Rex Kras for blood pressure management. He is evaluating recent description of aura with scheduled carotid Dopplers. Her primary sleep complaint remains difficulty initiating and maintaining sleep/insomnia. Obstructive sleep apnea has been identified and we have worked on it persistently. Unfortunately she has not tolerated CPAP or Provent nasal valves, and she has not been able to lose weight. Her dentist is interested in trying to make an oral appliance but apparently has limited experience with this. I have offered to send her to someone who has done a lot more of this kind of therapy. Restless legs have been identified in the past but does not seem to be a major clinical problem. Allergic rhinitis and conjunctivitis are managed symptomatically. Most recently she  had Pataday.  02/21/11- [name] followed here for allergic rhinitis/ conjunctivitis, and for insomnia with obstructive sleep apnea, complicated by history of migraine, hypertension, GERD. Since last here in August she has had no more paresthesias. She has given up on CPAP and Provent. nasal valves. Insomnia remains her primary sleep complaint. We have discussed this repeatedly and tried a number of approaches. Basic sleep hygiene is good. Fortunately workup for abnormal breast exam turned out benign. In the last 2-3 weeks she feels fall seasonal allergies bothering her. Eyes itch, water. Ears itch. She has a sore inside her right nostril from blowing her nose a lot. Clear mucus discharge. Denies chest tightness or wheeze.  05/02/11- [name] followed here for allergic rhinitis/ conjunctivitis, and for insomnia with obstructive sleep apnea, complicated by history of migraine, hypertension, GERD. Using clonazepam when needed for her insomnia. Failed to tolerate CPAP with multiple efforts, and the Provent nasal valves. Unable to lose weight. Lives alone with no reports of snoring or apnea. Did not get a dental oral device. Eyes have watered some, with little itch, sneeze or nasal congestion. Denies wheeze or cough.   07/04/11- [name] followed here for allergic rhinitis/ conjunctivitis, and for insomnia with obstructive sleep apnea/ failed CPAP, complicated by history of migraine, hypertension, GERD. Insomnia remains her primary sleep complaint. She is alone with no reports of snoring, witnessed apnea, or sleep disturbance by limb movements. We have discussed available treatments again. Seasonal rhinitis complaints are not yet the problem. We have reviewed symptomatic therapy is available if needed later in the spring. She is anticipating surgery for breast lump but does not know details yet. We do not anticipate  any respiratory concerns that would affect that surgery.  Review of Systems-see  HPI Constitutional:   No-   weight loss, night sweats, fevers, chills, fatigue, lassitude. + insomnia HEENT:   No-  headaches, difficulty swallowing, tooth/dental problems, sore throat,       No sneezing, itching, ears itch, nasal congestion, post nasal drip,  CV:  No-   chest pain, orthopnea, PND, swelling in lower extremities, anasarca, dizziness, palpitations Resp: No-   shortness of breath with exertion or at rest.              No-   productive cough,  No non-productive cough,  No-  coughing up of blood.              No-   change in color of mucus.  No- wheezing.   Skin: No-   rash or lesions. GI:  No-   heartburn, indigestion, abdominal pain, nausea, vomiting, diarrhea,                 change in bowel habits, loss of appetite GU: MS:  No-   joint pain or swelling.  No- decreased range of motion.  No- back pain. Neuro- :  Psych:  No- change in mood or affect. Some- depression or anxiety.  No memory loss.   Physical Exam  General- Alert, Oriented, Affect-appropriate, very pleasant, Distress- none acute Overweight Skin- rash-none, lesions- none, excoriation- none Lymphadenopathy- none Head- atraumatic            Eyes- Gross vision intact, PERRLA, conjunctivae clear secretions            Ears- Hearing, canals normal            Nose- , no -Septal dev, mucus, polyps, erosion, perforation. No turbinate edema or mucus bridging.            Throat- Mallampati III , mucosa clear , drainage- none, tonsils- atrophic Neck- flexible , trachea midline, no stridor , thyroid nl, carotid no bruit Chest - symmetrical excursion , unlabored           Heart/CV- RRR , no murmur , no gallop  , no rub, nl s1 s2                           - JVD- none , edema- none, stasis changes- none, varices- none           Lung- clear to P&A, wheeze- none, cough- none , dullness-none, rub- none           Chest wall-  Abd- tender-no,  Br/ Gen/ Rectal- Not done, not indicated Extrem- cyanosis- none, clubbing, none,  atrophy- none, strength- nl Neuro- grossly intact to observation

## 2011-07-05 ENCOUNTER — Ambulatory Visit (INDEPENDENT_AMBULATORY_CARE_PROVIDER_SITE_OTHER): Payer: Medicare Other | Admitting: Surgery

## 2011-07-05 ENCOUNTER — Encounter (INDEPENDENT_AMBULATORY_CARE_PROVIDER_SITE_OTHER): Payer: Self-pay | Admitting: Surgery

## 2011-07-05 VITALS — BP 136/78 | HR 70 | Temp 97.8°F | Resp 18 | Ht 63.0 in | Wt 180.2 lb

## 2011-07-05 DIAGNOSIS — N63 Unspecified lump in unspecified breast: Secondary | ICD-10-CM

## 2011-07-05 DIAGNOSIS — N6310 Unspecified lump in the right breast, unspecified quadrant: Secondary | ICD-10-CM

## 2011-07-05 NOTE — Progress Notes (Signed)
Patient ID: Teresa Molina, female   DOB: Dec 31, 1945, 66 y.o.   MRN: [id number]  Chief Complaint  Patient presents with  . Mass    breast    HPI Teresa Molina is a 66 y.o. female.  Referred by Dr. Colin Broach for evaluation of persistent right breast lump. HPI This is a 66 yo female who presents with a one year history of a persistent, tender, slowly enlarging mass under the edge of her right nipple.  She underwent evaluation by Dr. Jeralyn Ruths in 9/12.  She has a 9 x 3 x 7 mm cyst in the right breast at 5:00 3 cm from the nipple.  She also has a palpable mass under the edge of her right areola at 5:00 measuring less than 1 cm in diameter.   Past Medical History  Diagnosis Date  . ALLERGIC RHINITIS   . Hypertension   . Insomnia   . OSA on CPAP     NPSG 08-03-09: AHI 17.6; CPAP 12/ AHI 0; PLMA  . Allergic conjunctivitis   . GERD (gastroesophageal reflux disease)   . Esophageal reflux   . Food allergy     angioedema > strawberries  . Breast cyst     left breast - ultrasound benign 2010    Past Surgical History  Procedure Date  . Tonsillectomy 1952 - approximate  . Rotator cuff repair 2006  . Total abdominal hysterectomy 1978  . Cholecystectomy 1991    Family History  Problem Relation Age of Onset  . Other Father     brain tumor  . COPD Mother     Social History History  Substance Use Topics  . Smoking status: Never Smoker   . Smokeless tobacco: Never Used  . Alcohol Use: Yes     RARELY    Allergies  Allergen Reactions  . Amlodipine Swelling  . Diovan (Valsartan) Swelling  . Lipitor (Atorvastatin Calcium) Other (See Comments)    Caused muscle paralysis in legs  . Codeine   . Metoclopramide Hcl   . Benadryl (Altaryl) Palpitations  . Penicillins Other (See Comments)    Yeast infections  . Prochlorperazine Edisylate Other (See Comments)    Facial spasms    Current Outpatient Prescriptions  Medication Sig Dispense Refill  . ibuprofen  (ADVIL,MOTRIN) 200 MG tablet Take 200 mg by mouth as needed.      Marland Kitchen aspirin 81 MG tablet Take by mouth daily.        . clonazePAM (KLONOPIN) 0.5 MG tablet Take 0.5 tablets (0.25 mg total) by mouth 1 day or 1 dose. 1-2 for sleep if needed  30 tablet  prn  . estradiol (ESTRACE) 0.5 MG tablet Take 1 tablet (0.5 mg total) by mouth daily.  30 tablet  11  . ezetimibe-simvastatin (VYTORIN) 10-20 MG per tablet Take 1 tablet by mouth at bedtime.        Marland Kitchen labetalol (NORMODYNE) 200 MG tablet Take by mouth 2 (two) times daily.        . RABEprazole (ACIPHEX) 20 MG tablet Take by mouth daily.        Marland Kitchen triamterene-hydrochlorothiazide (MAXZIDE-25) 37.5-25 MG per tablet Take 1 tablet by mouth daily.          Review of Systems Review of Systems  Constitutional: Negative for fever, chills and unexpected weight change.  HENT: Negative for hearing loss, congestion, sore throat, trouble swallowing and voice change.   Eyes: Negative for visual disturbance.  Respiratory: Negative for cough and wheezing.  Cardiovascular: Negative for chest pain, palpitations and leg swelling.  Gastrointestinal: Negative for nausea, vomiting, abdominal pain, diarrhea, constipation, blood in stool, abdominal distention and anal bleeding.  Genitourinary: Negative for hematuria, vaginal bleeding and difficulty urinating.  Musculoskeletal: Negative for arthralgias.  Skin: Negative for rash and wound.  Neurological: Negative for seizures, syncope and headaches.  Hematological: Negative for adenopathy. Does not bruise/bleed easily.  Psychiatric/Behavioral: Negative for confusion.    Blood pressure 136/78, pulse 70, temperature 97.8 F (36.6 C), temperature source Temporal, resp. rate 18, height 5\' 3"  (1.6 m), weight 180 lb 3.2 oz (81.738 kg).  Physical Exam Physical Exam WDWN in NAD Breasts - left breast - no masses; no nipple retraction of discharge; no axillary lymphadenopathy Right breast - no axillary lymphadenopathy; healed  scar at upper edge of breast (?skin mole); At the edge of her right areola at 5:00, there is a visible, palpable mass measuring about 9 mm in diameter; mildly tender to palpation; freely mobile, non-inflamed Data Reviewed Mammogram/ ultrasound  Assessment    Superficial sub-centimeter retroareolar mass - right breast    Plan    Excision under local anesthetic in the office.  We will schedule this in the near future.  We will sent the specimen for pathologic examination.        Babe Clenney K. 07/05/2011, 12:00 PM

## 2011-07-08 NOTE — Assessment & Plan Note (Signed)
She is not recognizing this as an active issue.

## 2011-07-08 NOTE — Assessment & Plan Note (Signed)
She uses clonazepam intermittently and has been educated on sleep hygiene and relaxation methods.

## 2011-07-08 NOTE — Assessment & Plan Note (Signed)
Seasonal allergic rhinitis. Her symptoms usually develop later in the spring. She is familiar with symptomatic control.

## 2011-07-11 ENCOUNTER — Encounter (INDEPENDENT_AMBULATORY_CARE_PROVIDER_SITE_OTHER): Payer: Self-pay

## 2011-07-31 ENCOUNTER — Ambulatory Visit (INDEPENDENT_AMBULATORY_CARE_PROVIDER_SITE_OTHER): Payer: Medicare Other | Admitting: Surgery

## 2011-07-31 ENCOUNTER — Other Ambulatory Visit (INDEPENDENT_AMBULATORY_CARE_PROVIDER_SITE_OTHER): Payer: Self-pay | Admitting: Surgery

## 2011-07-31 ENCOUNTER — Encounter (INDEPENDENT_AMBULATORY_CARE_PROVIDER_SITE_OTHER): Payer: Self-pay | Admitting: Surgery

## 2011-07-31 VITALS — BP 123/78 | HR 70 | Temp 98.0°F | Resp 14 | Ht 63.0 in | Wt 181.0 lb

## 2011-07-31 DIAGNOSIS — D1739 Benign lipomatous neoplasm of skin and subcutaneous tissue of other sites: Secondary | ICD-10-CM

## 2011-07-31 DIAGNOSIS — N6310 Unspecified lump in the right breast, unspecified quadrant: Secondary | ICD-10-CM

## 2011-07-31 DIAGNOSIS — N63 Unspecified lump in unspecified breast: Secondary | ICD-10-CM

## 2011-07-31 NOTE — Progress Notes (Signed)
The patient has a small subcutaneous mass just below the areola at 6:00.  We prepped the area with Betadine and anesthetized with 1% lidocaine.  I made a small circumareolar incision and dissected through the dermis.  We excised a small firm fatty mass, that appeared to be a small lipoma.  I carefully inspected the dermis overlying this area, and no further masses are noted.  The wound was closed with 3-0 Vicryl and 4-0 Monocryl.  Steristrips and a clean dressing were applied.  The specimen is sent for pathology.  We will call the patient with the pathology results and she may shower in 2 days.  Follow-up PRN.  Wilmon Arms. Corliss Skains, MD, [facility]  07/31/2011 5:04 PM

## 2011-07-31 NOTE — Patient Instructions (Signed)
We will call you with the pathology report.  You may remove the dressing and shower over the steri-strips on Thursday.

## 2011-08-13 ENCOUNTER — Telehealth (INDEPENDENT_AMBULATORY_CARE_PROVIDER_SITE_OTHER): Payer: Self-pay | Admitting: General Surgery

## 2011-08-13 NOTE — Telephone Encounter (Signed)
Pt calling for path results, please.  Call her cell# [phone].

## 2011-08-14 ENCOUNTER — Telehealth (INDEPENDENT_AMBULATORY_CARE_PROVIDER_SITE_OTHER): Payer: Self-pay | Admitting: General Surgery

## 2011-08-14 NOTE — Telephone Encounter (Signed)
Called pt today to let her know about her path report and mailed a copy to pt

## 2011-09-05 ENCOUNTER — Ambulatory Visit (INDEPENDENT_AMBULATORY_CARE_PROVIDER_SITE_OTHER): Payer: Medicare Other | Admitting: Internal Medicine

## 2011-09-05 ENCOUNTER — Encounter: Payer: Self-pay | Admitting: Internal Medicine

## 2011-09-05 VITALS — BP 124/78 | HR 66 | Ht 63.0 in | Wt 179.2 lb

## 2011-09-05 DIAGNOSIS — G4700 Insomnia, unspecified: Secondary | ICD-10-CM

## 2011-09-05 DIAGNOSIS — G4730 Sleep apnea, unspecified: Secondary | ICD-10-CM

## 2011-09-05 DIAGNOSIS — J309 Allergic rhinitis, unspecified: Secondary | ICD-10-CM

## 2011-09-05 NOTE — Patient Instructions (Signed)
Please call as needed 

## 2011-09-05 NOTE — Progress Notes (Signed)
Patient ID: Teresa Molina, female    DOB: December 18, 1945, 66 y.o.   MRN: [id number]  HPI - [name] followed here for allergic rhinitis/ conjunctivitis, and for insomnia with obstructive sleep apnea.  She has not tolerated CPAP well and has been educated on alternatives. Currently she has a trial set of Provent nasal valves and is still very tentative, but trying. Her tendency is toward "busy-brain" insomnia with difficulty initiating sleep. She is also interested in trying a Silent Night oral appliance offered by her dentist.  She has not been able to lose weight.  For seasonal allergic irritation of eyes, watering and itching, she asks sample Pataday.  10/11/10- OSA, insomnia, allergic rhinitis Provent didn't work out well and she still felt she couldn't sleep throught the night. She is going to look into an oral appliance with her dentist.  Weight loss and sleep hygiene goals reviewed. Spring associated nasal congestion and watery eyes are getting better.  12/13/10- [name] followed here for allergic rhinitis/ conjunctivitis, and for insomnia with obstructive sleep apnea, complicated by history of migraine, hypertension, GERD. She follows with Dr. Rex Kras for blood pressure management. He is evaluating recent description of aura with scheduled carotid Dopplers. Her primary sleep complaint remains difficulty initiating and maintaining sleep/insomnia. Obstructive sleep apnea has been identified and we have worked on it persistently. Unfortunately she has not tolerated CPAP or Provent nasal valves, and she has not been able to lose weight. Her dentist is interested in trying to make an oral appliance but apparently has limited experience with this. I have offered to send her to someone who has done a lot more of this kind of therapy. Restless legs have been identified in the past but does not seem to be a major clinical problem. Allergic rhinitis and conjunctivitis are managed symptomatically. Most recently she  had Pataday.  02/21/11- [name] followed here for allergic rhinitis/ conjunctivitis, and for insomnia with obstructive sleep apnea, complicated by history of migraine, hypertension, GERD. Since last here in August she has had no more paresthesias. She has given up on CPAP and Provent. nasal valves. Insomnia remains her primary sleep complaint. We have discussed this repeatedly and tried a number of approaches. Basic sleep hygiene is good. Fortunately workup for abnormal breast exam turned out benign. In the last 2-3 weeks she feels fall seasonal allergies bothering her. Eyes itch, water. Ears itch. She has a sore inside her right nostril from blowing her nose a lot. Clear mucus discharge. Denies chest tightness or wheeze.  05/02/11- [name] followed here for allergic rhinitis/ conjunctivitis, and for insomnia with obstructive sleep apnea, complicated by history of migraine, hypertension, GERD. Using clonazepam when needed for her insomnia. Failed to tolerate CPAP with multiple efforts, and the Provent nasal valves. Unable to lose weight. Lives alone with no reports of snoring or apnea. Did not get a dental oral device. Eyes have watered some, with little itch, sneeze or nasal congestion. Denies wheeze or cough.   07/04/11- [name] followed here for allergic rhinitis/ conjunctivitis, and for insomnia with obstructive sleep apnea/ failed CPAP, complicated by history of migraine, hypertension, GERD. Insomnia remains her primary sleep complaint. She is alone with no reports of snoring, witnessed apnea, or sleep disturbance by limb movements. We have discussed available treatments again. Seasonal rhinitis complaints are not yet the problem. We have reviewed symptomatic therapy is available if needed later in the spring. She is anticipating surgery for breast lump but does not know details yet. We do not anticipate  any respiratory concerns that would affect that surgery.  09/05/11- [name] followed here for  allergic rhinitis/ conjunctivitis, and for insomnia with obstructive sleep apnea/ failed CPAP, complicated by history of migraine, hypertension, GERD. Breast lump was a benign lipoma but she is not sure was adequately excised. Intermittent antihistamine. Never really sleeps well or without interruption. Pattern has not changed.  Review of Systems-see HPI Constitutional:   No-   weight loss, night sweats, fevers, chills, fatigue, lassitude. + insomnia HEENT:   No-  headaches, difficulty swallowing, tooth/dental problems, sore throat,       No sneezing, itching, ears itch, nasal congestion, post nasal drip,  CV:  No-   chest pain, orthopnea, PND, swelling in lower extremities, anasarca, dizziness, palpitations Resp: No-   shortness of breath with exertion or at rest.              No-   productive cough,  No non-productive cough,  No-  coughing up of blood.              No-   change in color of mucus.  No- wheezing.   Skin: No-   rash or lesions. GI:  No-   heartburn, indigestion, abdominal pain, nausea, vomiting,  GU: MS:  No-   joint pain or swelling.   Neuro- :  Psych:  No- change in mood or affect. Some- depression or anxiety.  No memory loss.   Physical Exam  General- Alert, Oriented, Affect-appropriate, very pleasant, Distress- none acute Overweight Skin- rash-none, lesions- none, excoriation- none Lymphadenopathy- none Head- atraumatic            Eyes- Gross vision intact, PERRLA, conjunctivae clear secretions            Ears- Hearing, canals normal            Nose- , no -Septal dev, mucus, polyps, erosion, perforation. No turbinate edema or mucus bridging.            Throat- Mallampati III , mucosa clear , drainage- none, tonsils- atrophic Neck- flexible , trachea midline, no stridor , thyroid nl, carotid no bruit Chest - symmetrical excursion , unlabored           Heart/CV- RRR , no murmur , no gallop  , no rub, nl s1 s2                           - JVD- none , edema- none,  stasis changes- none, varices- none           Lung- clear to P&A, wheeze- none, cough- none , dullness-none, rub- none           Chest wall-  Abd- Br/ Gen/ Rectal- Not done, not indicated Extrem- cyanosis- none, clubbing, none, atrophy- none, strength- nl Neuro- grossly intact to observation

## 2011-09-08 NOTE — Assessment & Plan Note (Signed)
We have educated on the medical issues and available treatments for managing sleep apnea and insomnia. Insomnia is her primary complaint but she has not found a medication worked well. Consider CBT

## 2011-09-08 NOTE — Assessment & Plan Note (Signed)
Adequate control with seasonally adjusted antihistamine.

## 2011-09-14 ENCOUNTER — Other Ambulatory Visit: Payer: Self-pay | Admitting: Dermatology

## 2011-11-07 ENCOUNTER — Ambulatory Visit (INDEPENDENT_AMBULATORY_CARE_PROVIDER_SITE_OTHER): Payer: Medicare Other | Admitting: Internal Medicine

## 2011-11-07 ENCOUNTER — Encounter: Payer: Self-pay | Admitting: Internal Medicine

## 2011-11-07 VITALS — BP 110/78 | HR 94 | Ht 63.0 in | Wt 175.6 lb

## 2011-11-07 DIAGNOSIS — G4700 Insomnia, unspecified: Secondary | ICD-10-CM

## 2011-11-07 DIAGNOSIS — F5104 Psychophysiologic insomnia: Secondary | ICD-10-CM

## 2011-11-07 DIAGNOSIS — G4733 Obstructive sleep apnea (adult) (pediatric): Secondary | ICD-10-CM

## 2011-11-07 DIAGNOSIS — G4730 Sleep apnea, unspecified: Secondary | ICD-10-CM

## 2011-11-07 NOTE — Patient Instructions (Addendum)
Order - Teresa Molina unattended NPSG   Dx OSA  Order referral for Cognitive Behavioral Therapy for  Dx chronic insomnia

## 2011-11-07 NOTE — Progress Notes (Signed)
Patient ID: Teresa Molina, female    DOB: December 18, 1945, 66 y.o.   MRN: [id number]  HPI - 66yoF followed here for allergic rhinitis/ conjunctivitis, and for insomnia with obstructive sleep apnea.  She has not tolerated CPAP well and has been educated on alternatives. Currently she has a trial set of Provent nasal valves and is still very tentative, but trying. Her tendency is toward "busy-brain" insomnia with difficulty initiating sleep. She is also interested in trying a Silent Night oral appliance offered by her dentist.  She has not been able to lose weight.  For seasonal allergic irritation of eyes, watering and itching, she asks sample Pataday.  10/11/10- OSA, insomnia, allergic rhinitis Provent didn't work out well and she still felt she couldn't sleep throught the night. She is going to look into an oral appliance with her dentist.  Weight loss and sleep hygiene goals reviewed. Spring associated nasal congestion and watery eyes are getting better.  12/13/10- 66yoF followed here for allergic rhinitis/ conjunctivitis, and for insomnia with obstructive sleep apnea, complicated by history of migraine, hypertension, GERD. She follows with Dr. Rex Kras for blood pressure management. He is evaluating recent description of aura with scheduled carotid Dopplers. Her primary sleep complaint remains difficulty initiating and maintaining sleep/insomnia. Obstructive sleep apnea has been identified and we have worked on it persistently. Unfortunately she has not tolerated CPAP or Provent nasal valves, and she has not been able to lose weight. Her dentist is interested in trying to make an oral appliance but apparently has limited experience with this. I have offered to send her to someone who has done a lot more of this kind of therapy. Restless legs have been identified in the past but does not seem to be a major clinical problem. Allergic rhinitis and conjunctivitis are managed symptomatically. Most recently she  had Pataday.  02/21/11- 66yoF followed here for allergic rhinitis/ conjunctivitis, and for insomnia with obstructive sleep apnea, complicated by history of migraine, hypertension, GERD. Since last here in August she has had no more paresthesias. She has given up on CPAP and Provent. nasal valves. Insomnia remains her primary sleep complaint. We have discussed this repeatedly and tried a number of approaches. Basic sleep hygiene is good. Fortunately workup for abnormal breast exam turned out benign. In the last 2-3 weeks she feels fall seasonal allergies bothering her. Eyes itch, water. Ears itch. She has a sore inside her right nostril from blowing her nose a lot. Clear mucus discharge. Denies chest tightness or wheeze.  05/02/11- 66yoF followed here for allergic rhinitis/ conjunctivitis, and for insomnia with obstructive sleep apnea, complicated by history of migraine, hypertension, GERD. Using clonazepam when needed for her insomnia. Failed to tolerate CPAP with multiple efforts, and the Provent nasal valves. Unable to lose weight. Lives alone with no reports of snoring or apnea. Did not get a dental oral device. Eyes have watered some, with little itch, sneeze or nasal congestion. Denies wheeze or cough.   07/04/11- 66yoF followed here for allergic rhinitis/ conjunctivitis, and for insomnia with obstructive sleep apnea/ failed CPAP, complicated by history of migraine, hypertension, GERD. Insomnia remains her primary sleep complaint. She is alone with no reports of snoring, witnessed apnea, or sleep disturbance by limb movements. We have discussed available treatments again. Seasonal rhinitis complaints are not yet the problem. We have reviewed symptomatic therapy is available if needed later in the spring. She is anticipating surgery for breast lump but does not know details yet. We do not anticipate  any respiratory concerns that would affect that surgery.  09/05/11- 66yoF followed here for  allergic rhinitis/ conjunctivitis, and for insomnia with obstructive sleep apnea/ failed CPAP, complicated by history of migraine, hypertension, GERD. Breast lump was a benign lipoma but she is not sure was adequately excised. Intermittent antihistamine. Never really sleeps well or without interruption. Pattern has not changed.  11/07/11- 66yoF followed here for allergic rhinitis/ conjunctivitis, and for insomnia with obstructive sleep apnea/ failed CPAP, complicated by history of migraine, hypertension, GERD. Pt still not using cpap and would like something new. We talked again about available treatments including oral appliances, nasal valves. We feel it is time for reassessment and she should qualify for a home unattended sleep study. Her biggest sleep complaint has always been difficulty initiating and maintaining sleep, mostly with difficulty relaxing. I suggested formal referral for a trial at Cognitive Behavioral Therapy. She is playing tennis and walking regularly with no acute problems.  Review of Systems-see HPI Constitutional:   No-   weight loss, night sweats, fevers, chills, fatigue, lassitude. + insomnia HEENT:   No-  headaches, difficulty swallowing, tooth/dental problems, sore throat,       No sneezing, itching, ears itch, nasal congestion, post nasal drip,  CV:  No-   chest pain, orthopnea, PND, swelling in lower extremities, anasarca, dizziness, palpitations Resp: No-   shortness of breath with exertion or at rest.              No-   productive cough,  No non-productive cough,  No-  coughing up of blood.              No-   change in color of mucus.  No- wheezing.   Skin: No-   rash or lesions. GI:  No-   heartburn, indigestion, abdominal pain, nausea, vomiting,  GU: MS:  No-   joint pain or swelling.   Neuro- :  Psych:  No- change in mood or affect. Some- depression or anxiety.  No memory loss.   Physical Exam  General- Alert, Oriented, Affect-appropriate, very pleasant,  Distress- none acute, overweight Skin- rash-none, lesions- none, excoriation- none Lymphadenopathy- none Head- atraumatic            Eyes- Gross vision intact, PERRLA, conjunctivae clear secretions            Ears- Hearing, canals normal            Nose- , no -Septal dev, mucus, polyps, erosion, perforation. No turbinate edema or mucus bridging.            Throat- Mallampati III , mucosa clear , drainage- none, tonsils- atrophic Neck- flexible , trachea midline, no stridor , thyroid nl, carotid no bruit Chest - symmetrical excursion , unlabored           Heart/CV- RRR , no murmur , no gallop  , no rub, nl s1 s2                           - JVD- none , edema- none, stasis changes- none, varices- none           Lung- clear to P&A, wheeze- none, cough- none , dullness-none, rub- none           Chest wall-  Abd- Br/ Gen/ Rectal- Not done, not indicated Extrem- cyanosis- none, clubbing, none, atrophy- none, strength- nl Neuro- grossly intact to observation

## 2011-11-11 NOTE — Assessment & Plan Note (Signed)
Plan-unattended home sleep study to clarify importance of obstructive sleep apnea now.

## 2011-11-11 NOTE — Assessment & Plan Note (Signed)
Difficulty initiating and maintaining sleep. "Busy brain". Plan-seek referral for Cognitive Behavioral Therapy

## 2011-12-07 ENCOUNTER — Ambulatory Visit (INDEPENDENT_AMBULATORY_CARE_PROVIDER_SITE_OTHER): Payer: Medicare Other | Admitting: Internal Medicine

## 2011-12-07 DIAGNOSIS — G4733 Obstructive sleep apnea (adult) (pediatric): Secondary | ICD-10-CM

## 2011-12-20 ENCOUNTER — Ambulatory Visit (INDEPENDENT_AMBULATORY_CARE_PROVIDER_SITE_OTHER): Payer: Medicare Other | Admitting: General Surgery

## 2011-12-27 ENCOUNTER — Ambulatory Visit (INDEPENDENT_AMBULATORY_CARE_PROVIDER_SITE_OTHER): Payer: Medicare Other | Admitting: General Surgery

## 2011-12-27 ENCOUNTER — Encounter (INDEPENDENT_AMBULATORY_CARE_PROVIDER_SITE_OTHER): Payer: Self-pay | Admitting: General Surgery

## 2011-12-27 VITALS — BP 134/86 | HR 66 | Temp 97.3°F | Resp 16 | Ht 63.0 in | Wt 175.8 lb

## 2011-12-27 DIAGNOSIS — N63 Unspecified lump in unspecified breast: Secondary | ICD-10-CM

## 2011-12-27 DIAGNOSIS — N6310 Unspecified lump in the right breast, unspecified quadrant: Secondary | ICD-10-CM

## 2011-12-27 NOTE — Progress Notes (Signed)
Chief complaint: Lump right nipple  History: Patient returns to the office. I know her from a cholecystectomy years ago. For at least 8 months she has noticed a small lump underneath the lower part of her right areola. Imaging this spring at [facility] was negative. She saw Dr. Corliss Skains and in March of this year under local anesthesia he did an excision with a small inferior periareolar incision. He found what appeared to be a lipoma which was removed and pathology was consistent with this diagnosis. As the wound healed however the patient has felt that the lump is still present. She thinks it has enlarged slightly in recent months. She has no previous history of breast biopsies or breast disease. No nipple discharge. No family history of breast cancer. Past Medical History  Diagnosis Date  . ALLERGIC RHINITIS   . Hypertension   . Insomnia   . OSA on CPAP     NPSG 08-03-09: AHI 17.6; CPAP 12/ AHI 0; [facility]MA  . Allergic conjunctivitis   . GERD (gastroesophageal reflux disease)   . Esophageal reflux   . Food allergy     angioedema > strawberries  . Breast cyst     left breast - ultrasound benign 2010   Past Surgical History  Procedure Date  . Tonsillectomy 1952 - approximate  . Rotator cuff repair 2006  . Total abdominal hysterectomy 1978  . Cholecystectomy 1991   Current Outpatient Prescriptions  Medication Sig Dispense Refill  . aspirin 81 MG tablet Take by mouth daily.        . clonazePAM (KLONOPIN) 0.5 MG tablet Take 0.5 tablets (0.25 mg total) by mouth 1 day or 1 dose. 1-2 for sleep if needed  30 tablet  prn  . estradiol (ESTRACE) 0.5 MG tablet Take 1 tablet (0.5 mg total) by mouth daily.  30 tablet  11  . ezetimibe-simvastatin (VYTORIN) 10-20 MG per tablet Take 1 tablet by mouth at bedtime.        . fluconazole (DIFLUCAN) 200 MG tablet       . ibuprofen (ADVIL,MOTRIN) 200 MG tablet Take 200 mg by mouth as needed.      . labetalol (NORMODYNE) 200 MG tablet Take by mouth 2 (two) times daily.         . mupirocin ointment (BACTROBAN) 2 %       . RABEprazole (ACIPHEX) 20 MG tablet Take by mouth daily.        Marland Kitchen triamterene-hydrochlorothiazide (MAXZIDE-25) 37.5-25 MG per tablet Take 1 tablet by mouth daily.         Allergies  Allergen Reactions  . Amlodipine Swelling  . Diovan (Valsartan) Swelling  . Lipitor (Atorvastatin Calcium) Other (See Comments)    Caused muscle paralysis in legs  . Codeine   . Metoclopramide Hcl   . Benadryl (Diphenhydramine Hcl) Palpitations  . Penicillins Other (See Comments)    Yeast infections  . Prochlorperazine Edisylate Other (See Comments)    Facial spasms   Exam: BP 134/86  Pulse 66  Temp 97.3 F (36.3 C) (Temporal)  Resp 16  Ht 5\' 3"  (1.6 m)  Wt 175 lb 12.8 oz (79.742 kg)  BMI 31.14 kg/m2 General: Mildly overweight Caucasian female Skin: No rash or infection Lungs: Clear equal breath sounds Lymph nodes: No cervical, supravesicular, axillary nodes palpable Breasts: The right nipple there is a small well-healed periareolar incision at 6:00. About half a centimeter above this beneath the areolar skin is a small discrete rounded mass which would feel to be  just under 1 cm. There are no other palpable breast masses or nipple areolar changes on either side.  Assessment and plan: Right subareolar mass. This could represent scarring from her previous excision although it feels somewhat more discrete than this. I discussed with the patient that it certainly would not be difficult the tissue swollen under local anesthesia at the time of excision to miss a small mass. As she feels this is definitely the same mass that was present previously and has enlarged slightly I think we need to go ahead with excision. This may well be a sebaceous cyst with some attachment to the skin and I have recommended that we excise a small amount of the areolar skin overlying the mass. I discussed with her that this would change the shape of her areola slightly and she is  okay with this. I would recommend a brief general anesthesia as an outpatient as this will be a little more extensive procedure. Was all discussed with her and she is in agreement.

## 2012-01-11 ENCOUNTER — Encounter: Payer: Self-pay | Admitting: Internal Medicine

## 2012-01-11 ENCOUNTER — Ambulatory Visit (INDEPENDENT_AMBULATORY_CARE_PROVIDER_SITE_OTHER): Payer: Medicare Other | Admitting: Internal Medicine

## 2012-01-11 VITALS — BP 130/70 | HR 79 | Ht 63.0 in | Wt 177.2 lb

## 2012-01-11 DIAGNOSIS — J302 Other seasonal allergic rhinitis: Secondary | ICD-10-CM

## 2012-01-11 DIAGNOSIS — J309 Allergic rhinitis, unspecified: Secondary | ICD-10-CM

## 2012-01-11 DIAGNOSIS — G4700 Insomnia, unspecified: Secondary | ICD-10-CM

## 2012-01-11 DIAGNOSIS — G4730 Sleep apnea, unspecified: Secondary | ICD-10-CM

## 2012-01-11 MED ORDER — ALPRAZOLAM 0.5 MG PO TABS: 0.5000 mg | ORAL_TABLET | Freq: Every evening | ORAL | Status: AC | PRN

## 2012-01-11 NOTE — Patient Instructions (Addendum)
script to try alprazolam/ xanax for sleep  Still ok to try the Cognitive Behavioral Therapy approach  Try Allaway eye drops for allergy

## 2012-01-11 NOTE — Progress Notes (Signed)
Patient ID: Teresa Molina, female    DOB: December 18, 1945, 66 y.o.   MRN: [id number]  HPI - [name] followed here for allergic rhinitis/ conjunctivitis, and for insomnia with obstructive sleep apnea.  She has not tolerated CPAP well and has been educated on alternatives. Currently she has a trial set of Provent nasal valves and is still very tentative, but trying. Her tendency is toward "busy-brain" insomnia with difficulty initiating sleep. She is also interested in trying a Silent Night oral appliance offered by her dentist.  She has not been able to lose weight.  For seasonal allergic irritation of eyes, watering and itching, she asks sample Pataday.  10/11/10- OSA, insomnia, allergic rhinitis Provent didn't work out well and she still felt she couldn't sleep throught the night. She is going to look into an oral appliance with her dentist.  Weight loss and sleep hygiene goals reviewed. Spring associated nasal congestion and watery eyes are getting better.  12/13/10- [name] followed here for allergic rhinitis/ conjunctivitis, and for insomnia with obstructive sleep apnea, complicated by history of migraine, hypertension, GERD. She follows with Dr. Rex Kras for blood pressure management. He is evaluating recent description of aura with scheduled carotid Dopplers. Her primary sleep complaint remains difficulty initiating and maintaining sleep/insomnia. Obstructive sleep apnea has been identified and we have worked on it persistently. Unfortunately she has not tolerated CPAP or Provent nasal valves, and she has not been able to lose weight. Her dentist is interested in trying to make an oral appliance but apparently has limited experience with this. I have offered to send her to someone who has done a lot more of this kind of therapy. Restless legs have been identified in the past but does not seem to be a major clinical problem. Allergic rhinitis and conjunctivitis are managed symptomatically. Most recently she  had Pataday.  02/21/11- [name] followed here for allergic rhinitis/ conjunctivitis, and for insomnia with obstructive sleep apnea, complicated by history of migraine, hypertension, GERD. Since last here in August she has had no more paresthesias. She has given up on CPAP and Provent. nasal valves. Insomnia remains her primary sleep complaint. We have discussed this repeatedly and tried a number of approaches. Basic sleep hygiene is good. Fortunately workup for abnormal breast exam turned out benign. In the last 2-3 weeks she feels fall seasonal allergies bothering her. Eyes itch, water. Ears itch. She has a sore inside her right nostril from blowing her nose a lot. Clear mucus discharge. Denies chest tightness or wheeze.  05/02/11- [name] followed here for allergic rhinitis/ conjunctivitis, and for insomnia with obstructive sleep apnea, complicated by history of migraine, hypertension, GERD. Using clonazepam when needed for her insomnia. Failed to tolerate CPAP with multiple efforts, and the Provent nasal valves. Unable to lose weight. Lives alone with no reports of snoring or apnea. Did not get a dental oral device. Eyes have watered some, with little itch, sneeze or nasal congestion. Denies wheeze or cough.   07/04/11- [name] followed here for allergic rhinitis/ conjunctivitis, and for insomnia with obstructive sleep apnea/ failed CPAP, complicated by history of migraine, hypertension, GERD. Insomnia remains her primary sleep complaint. She is alone with no reports of snoring, witnessed apnea, or sleep disturbance by limb movements. We have discussed available treatments again. Seasonal rhinitis complaints are not yet the problem. We have reviewed symptomatic therapy is available if needed later in the spring. She is anticipating surgery for breast lump but does not know details yet. We do not anticipate  any respiratory concerns that would affect that surgery.  09/05/11- [name] followed here for  allergic rhinitis/ conjunctivitis, and for insomnia with obstructive sleep apnea/ failed CPAP, complicated by history of migraine, hypertension, GERD. Breast lump was a benign lipoma but she is not sure was adequately excised. Intermittent antihistamine. Never really sleeps well or without interruption. Pattern has not changed.  11/07/11- [name] followed here for allergic rhinitis/ conjunctivitis, and for insomnia with obstructive sleep apnea/ failed CPAP, complicated by history of migraine, hypertension, GERD. Pt still not using cpap and would like something new. We talked again about available treatments including oral appliances, nasal valves. We feel it is time for reassessment and she should qualify for a home unattended sleep study. Her biggest sleep complaint has always been difficulty initiating and maintaining sleep, mostly with difficulty relaxing. I suggested formal referral for a trial at [facility] Therapy. She is playing tennis and walking regularly with no acute problems.  01/11/12- 65 yoF never smoker followed here for allergic rhinitis/ conjunctivitis, and for insomnia with obstructive sleep apnea/ failed CPAP, complicated by history of migraine, hypertension, GERD. Her primary concern continues to be insomnia with difficulty initiating and maintaining sleep. She tried a wide Friday of interventions for sleep apnea but none improved her quality of life or ability to sleep well at night. 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 This creates additional stress, as do significant health problems in her family. We have discussed the impact on her sleep quality. She expresses intent to follow through with the cognitive behavioral therapy referral we had made. Allergy symptoms are generally controlled with OTC antihistamines now spring was the worst time of year.  Review of Systems-see HPI Constitutional:   No-   weight loss, night sweats,  fevers, chills, fatigue, lassitude. + insomnia HEENT:   No-  headaches, difficulty swallowing, tooth/dental problems, sore throat,       No sneezing, itching, ears itch, nasal congestion, post nasal drip,  CV:  No-   chest pain, orthopnea, PND, swelling in lower extremities, anasarca, dizziness, palpitations Resp: No-   shortness of breath with exertion or at rest.              No-   productive cough,  No non-productive cough,  No-  coughing up of blood.              No-   change in color of mucus.  No- wheezing.   Skin: No-   rash or lesions. GI:  No-   heartburn, indigestion, abdominal pain, nausea, vomiting,  GU: MS:  No-   joint pain or swelling.   Neuro- :  Psych:  No- change in mood or affect. Some- depression or anxiety.  No memory loss.   Physical Exam  General- Alert, Oriented, Affect-appropriate, very pleasant, Distress- none acute, overweight Skin- rash-none, lesions- none, excoriation- none Lymphadenopathy- none Head- atraumatic            Eyes- Gross vision intact, PERRLA, conjunctivae clear secretions            Ears- Hearing, canals normal            Nose- , no -Septal dev, mucus, polyps, erosion, perforation. No turbinate edema or mucus bridging.            Throat- Mallampati III , mucosa clear , drainage- none, tonsils- atrophic Neck- flexible , trachea midline, no stridor , thyroid nl, carotid no bruit Chest - symmetrical  excursion , unlabored           Heart/CV- RRR , no murmur , no gallop  , no rub, nl s1 s2                           - JVD- none , edema- none, stasis changes- none, varices- none           Lung- clear to P&A, wheeze- none, cough- none , dullness-none, rub- none           Chest wall-  Abd- Br/ Gen/ Rectal- Not done, not indicated Extrem- cyanosis- none, clubbing, none, atrophy- none, strength- nl Neuro- grossly intact to observation

## 2012-01-20 ENCOUNTER — Encounter: Payer: Self-pay | Admitting: Internal Medicine

## 2012-01-20 NOTE — Assessment & Plan Note (Signed)
I have given referral information psychologists trained in cognitive behavioral therapy. She indicates she will go she is ready

## 2012-01-20 NOTE — Assessment & Plan Note (Signed)
Spring season was hardest, but manage with OTC antihistamines

## 2012-01-28 ENCOUNTER — Encounter (INDEPENDENT_AMBULATORY_CARE_PROVIDER_SITE_OTHER): Payer: Self-pay

## 2012-02-04 ENCOUNTER — Encounter: Payer: Self-pay | Admitting: Gynecology

## 2012-02-06 ENCOUNTER — Other Ambulatory Visit (INDEPENDENT_AMBULATORY_CARE_PROVIDER_SITE_OTHER): Payer: Self-pay | Admitting: General Surgery

## 2012-02-06 ENCOUNTER — Telehealth (INDEPENDENT_AMBULATORY_CARE_PROVIDER_SITE_OTHER): Payer: Self-pay | Admitting: General Surgery

## 2012-02-06 DIAGNOSIS — D249 Benign neoplasm of unspecified breast: Secondary | ICD-10-CM

## 2012-02-06 NOTE — Telephone Encounter (Signed)
Pt was told by nurse before her surgery today to expect to be on antibiotic afterwards, but she did not get a Rx for one.  (Done at [facility], so no records in Epic.)  Please let her know:  [phone].

## 2012-02-13 ENCOUNTER — Telehealth (INDEPENDENT_AMBULATORY_CARE_PROVIDER_SITE_OTHER): Payer: Self-pay

## 2012-02-13 NOTE — Telephone Encounter (Signed)
Called patient and gave her benign pat results

## 2012-02-15 ENCOUNTER — Encounter (INDEPENDENT_AMBULATORY_CARE_PROVIDER_SITE_OTHER): Payer: Self-pay | Admitting: General Surgery

## 2012-02-15 ENCOUNTER — Ambulatory Visit (INDEPENDENT_AMBULATORY_CARE_PROVIDER_SITE_OTHER): Payer: Medicare Other | Admitting: General Surgery

## 2012-02-15 VITALS — BP 128/84 | HR 72 | Temp 97.6°F | Resp 16 | Ht 63.0 in | Wt 171.6 lb

## 2012-02-15 DIAGNOSIS — Z09 Encounter for follow-up examination after completed treatment for conditions other than malignant neoplasm: Secondary | ICD-10-CM

## 2012-02-15 NOTE — Progress Notes (Signed)
History: Patient returns for followup after excision of the right areolar mass. This proved to be a ruptured sebaceous cyst pathologically. She feels she is doing very well.  Exam: Circumareolar incision is well healed without complication  Assessment and plan: Doing well following excision of areolar mass. She will continue routine breast screening and return as needed.

## 2012-03-12 ENCOUNTER — Ambulatory Visit (INDEPENDENT_AMBULATORY_CARE_PROVIDER_SITE_OTHER): Payer: Medicare Other | Admitting: Internal Medicine

## 2012-03-12 ENCOUNTER — Encounter: Payer: Self-pay | Admitting: Internal Medicine

## 2012-03-12 VITALS — BP 118/80 | HR 66 | Ht 63.0 in | Wt 173.6 lb

## 2012-03-12 DIAGNOSIS — N6310 Unspecified lump in the right breast, unspecified quadrant: Secondary | ICD-10-CM

## 2012-03-12 DIAGNOSIS — G4700 Insomnia, unspecified: Secondary | ICD-10-CM

## 2012-03-12 DIAGNOSIS — Z23 Encounter for immunization: Secondary | ICD-10-CM

## 2012-03-12 DIAGNOSIS — G4730 Sleep apnea, unspecified: Secondary | ICD-10-CM

## 2012-03-12 DIAGNOSIS — N63 Unspecified lump in unspecified breast: Secondary | ICD-10-CM

## 2012-03-12 NOTE — Progress Notes (Signed)
Patient ID: Teresa Molina, female    DOB: December 18, 1945, 66 y.o.   MRN: [id number]  HPI - 66yoF followed here for allergic rhinitis/ conjunctivitis, and for insomnia with obstructive sleep apnea.  She has not tolerated CPAP well and has been educated on alternatives. Currently she has a trial set of Provent nasal valves and is still very tentative, but trying. Her tendency is toward "busy-brain" insomnia with difficulty initiating sleep. She is also interested in trying a Silent Night oral appliance offered by her dentist.  She has not been able to lose weight.  For seasonal allergic irritation of eyes, watering and itching, she asks sample Pataday.  10/11/10- OSA, insomnia, allergic rhinitis Provent didn't work out well and she still felt she couldn't sleep throught the night. She is going to look into an oral appliance with her dentist.  Weight loss and sleep hygiene goals reviewed. Spring associated nasal congestion and watery eyes are getting better.  12/13/10- 66yoF followed here for allergic rhinitis/ conjunctivitis, and for insomnia with obstructive sleep apnea, complicated by history of migraine, hypertension, GERD. She follows with Dr. Rex Kras for blood pressure management. He is evaluating recent description of aura with scheduled carotid Dopplers. Her primary sleep complaint remains difficulty initiating and maintaining sleep/insomnia. Obstructive sleep apnea has been identified and we have worked on it persistently. Unfortunately she has not tolerated CPAP or Provent nasal valves, and she has not been able to lose weight. Her dentist is interested in trying to make an oral appliance but apparently has limited experience with this. I have offered to send her to someone who has done a lot more of this kind of therapy. Restless legs have been identified in the past but does not seem to be a major clinical problem. Allergic rhinitis and conjunctivitis are managed symptomatically. Most recently she  had Pataday.  02/21/11- 66yoF followed here for allergic rhinitis/ conjunctivitis, and for insomnia with obstructive sleep apnea, complicated by history of migraine, hypertension, GERD. Since last here in August she has had no more paresthesias. She has given up on CPAP and Provent. nasal valves. Insomnia remains her primary sleep complaint. We have discussed this repeatedly and tried a number of approaches. Basic sleep hygiene is good. Fortunately workup for abnormal breast exam turned out benign. In the last 2-3 weeks she feels fall seasonal allergies bothering her. Eyes itch, water. Ears itch. She has a sore inside her right nostril from blowing her nose a lot. Clear mucus discharge. Denies chest tightness or wheeze.  05/02/11- 66yoF followed here for allergic rhinitis/ conjunctivitis, and for insomnia with obstructive sleep apnea, complicated by history of migraine, hypertension, GERD. Using clonazepam when needed for her insomnia. Failed to tolerate CPAP with multiple efforts, and the Provent nasal valves. Unable to lose weight. Lives alone with no reports of snoring or apnea. Did not get a dental oral device. Eyes have watered some, with little itch, sneeze or nasal congestion. Denies wheeze or cough.   07/04/11- 66yoF followed here for allergic rhinitis/ conjunctivitis, and for insomnia with obstructive sleep apnea/ failed CPAP, complicated by history of migraine, hypertension, GERD. Insomnia remains her primary sleep complaint. She is alone with no reports of snoring, witnessed apnea, or sleep disturbance by limb movements. We have discussed available treatments again. Seasonal rhinitis complaints are not yet the problem. We have reviewed symptomatic therapy is available if needed later in the spring. She is anticipating surgery for breast lump but does not know details yet. We do not anticipate  any respiratory concerns that would affect that surgery.  09/05/11- 66yoF followed here for  allergic rhinitis/ conjunctivitis, and for insomnia with obstructive sleep apnea/ failed CPAP, complicated by history of migraine, hypertension, GERD. Breast lump was a benign lipoma but she is not sure was adequately excised. Intermittent antihistamine. Never really sleeps well or without interruption. Pattern has not changed.  11/07/11- 66yoF followed here for allergic rhinitis/ conjunctivitis, and for insomnia with obstructive sleep apnea/ failed CPAP, complicated by history of migraine, hypertension, GERD. Pt still not using cpap and would like something new. We talked again about available treatments including oral appliances, nasal valves. We feel it is time for reassessment and she should qualify for a home unattended sleep study. Her biggest sleep complaint has always been difficulty initiating and maintaining sleep, mostly with difficulty relaxing. I suggested formal referral for a trial at [facility]. She is playing tennis and walking regularly with no acute problems.  01/11/12- 65 yoF never smoker followed here for allergic rhinitis/ conjunctivitis, and for insomnia with obstructive sleep apnea/ failed CPAP, complicated by history of migraine, hypertension, GERD. Her primary concern continues to be insomnia with difficulty initiating and maintaining sleep. She tried a wide Friday of interventions for sleep apnea but none improved her quality of life or ability to sleep well at night. 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 This creates additional stress, as do significant health problems in her family. We have discussed the impact on her sleep quality. She expresses intent to follow through with the cognitive behavioral therapy referral we had made. Allergy symptoms are generally controlled with OTC antihistamines now spring was the worst time of year.  03/12/12-  55 yoF never smoker followed here for allergic rhinitis/  conjunctivitis, and for insomnia with obstructive sleep apnea/ failed CPAP, complicated by history of migraine, hypertension, GERD. Still not sleeping well and would like to discuss her sleep med. Primary complaint is difficulty initiating and maintaining sleep. We have repeatedly reviewed sleep hygiene. Several medications tried as noted in record. She describes 2 hour sleep latency with Xanax 0.5 mg. Sleep soundly after she finally falls asleep. Lives alone. Stressful, rather political job with [employer].  Review of Systems-see HPI Constitutional:   No-   weight loss, night sweats, fevers, chills, fatigue, lassitude. + insomnia HEENT:   No-  headaches, difficulty swallowing, tooth/dental problems, sore throat,       No sneezing, itching, ears itch, nasal congestion, post nasal drip,  CV:  No-   chest pain, orthopnea, PND, swelling in lower extremities, anasarca, dizziness, palpitations Resp: No-   shortness of breath with exertion or at rest.              No-   productive cough,  No non-productive cough,  No-  coughing up of blood.              No-   change in color of mucus.  No- wheezing.   Skin: No-   rash or lesions. GI:  No-   heartburn, indigestion, abdominal pain, nausea, vomiting,  GU: MS:  No-   joint pain or swelling.   Neuro- :  Psych:  No- change in mood or affect. Some- depression or anxiety.  No memory loss.   Physical Exam  General- Alert, Oriented, Affect-appropriate, very pleasant, Distress- none acute, overweight Skin- rash-none, lesions- none, excoriation- none Lymphadenopathy- none Head- atraumatic  Eyes- Gross vision intact, PERRLA, conjunctivae clear secretions            Ears- Hearing, canals normal            Nose- , no -Septal dev, mucus, polyps, erosion, perforation. No turbinate edema or mucus bridging.            Throat- Mallampati III , mucosa clear , drainage- none, tonsils- atrophic Neck- flexible , trachea midline, no stridor ,  thyroid nl, carotid no bruit Chest - symmetrical excursion , unlabored           Heart/CV- RRR , no murmur , no gallop  , no rub, nl s1 s2                           - JVD- none , edema- none, stasis changes- none, varices- none           Lung- clear to P&A, wheeze- none, cough- none , dullness-none, rub- none           Chest wall-  Abd- Br/ Gen/ Rectal- Not done, not indicated Extrem- cyanosis- none, clubbing, none, atrophy- none, strength- nl Neuro- grossly intact to observation

## 2012-03-12 NOTE — Patient Instructions (Addendum)
Try taking the xanax 0.5mg  an hour or two before your desired bedtime. You can also try taking 1/2 tab 2 hours before bedtime, then 1/2 at bedtime. We will also consider taking 1/2 tab 2 hours before bedtime and a whole tab at bedtime.

## 2012-03-23 NOTE — Assessment & Plan Note (Signed)
We have discussed different strategies for staggering the dose of Xanax, addressing difficulty with sleep onset, maintenance of sleep, and stress.

## 2012-03-23 NOTE — Assessment & Plan Note (Signed)
No further intervention required beyond routine surgical followup.

## 2012-04-21 ENCOUNTER — Ambulatory Visit: Payer: Medicare Other | Admitting: Internal Medicine

## 2012-04-21 ENCOUNTER — Ambulatory Visit (INDEPENDENT_AMBULATORY_CARE_PROVIDER_SITE_OTHER): Payer: Medicare Other | Admitting: Internal Medicine

## 2012-04-21 ENCOUNTER — Encounter: Payer: Self-pay | Admitting: Internal Medicine

## 2012-04-21 ENCOUNTER — Telehealth: Payer: Self-pay | Admitting: Internal Medicine

## 2012-04-21 VITALS — BP 116/74 | HR 69 | Ht 63.0 in | Wt 169.0 lb

## 2012-04-21 DIAGNOSIS — J40 Bronchitis, not specified as acute or chronic: Secondary | ICD-10-CM

## 2012-04-21 MED ORDER — AZITHROMYCIN 250 MG PO TABS: ORAL_TABLET | ORAL | Status: DC

## 2012-04-21 MED ORDER — METHYLPREDNISOLONE ACETATE 80 MG/ML IJ SUSP
80.0000 mg | Freq: Once | INTRAMUSCULAR | Status: AC
  Administered 2012-04-21: 80 mg via INTRAMUSCULAR

## 2012-04-21 MED ORDER — PREDNISONE 10 MG PO TABS: ORAL_TABLET | ORAL | Status: DC

## 2012-04-21 NOTE — Patient Instructions (Addendum)
Depo 80    Z pak script sent  Script to hold for Prednisone taper.  Fluids, throat lozenges, and your vitamin D may help  Please call as needed

## 2012-04-21 NOTE — Telephone Encounter (Signed)
Per CY-pt to be added to schedule. Pt is being seen now.

## 2012-04-21 NOTE — Progress Notes (Signed)
Patient ID: Teresa Molina, female    DOB: December 18, 1945, 66 y.o.   MRN: [id number]  HPI - 66yoF followed here for allergic rhinitis/ conjunctivitis, and for insomnia with obstructive sleep apnea.  She has not tolerated CPAP well and has been educated on alternatives. Currently she has a trial set of Provent nasal valves and is still very tentative, but trying. Her tendency is toward "busy-brain" insomnia with difficulty initiating sleep. She is also interested in trying a Silent Night oral appliance offered by her dentist.  She has not been able to lose weight.  For seasonal allergic irritation of eyes, watering and itching, she asks sample Pataday.  10/11/10- OSA, insomnia, allergic rhinitis Provent didn't work out well and she still felt she couldn't sleep throught the night. She is going to look into an oral appliance with her dentist.  Weight loss and sleep hygiene goals reviewed. Spring associated nasal congestion and watery eyes are getting better.  12/13/10- 66yoF followed here for allergic rhinitis/ conjunctivitis, and for insomnia with obstructive sleep apnea, complicated by history of migraine, hypertension, GERD. She follows with Dr. Rex Kras for blood pressure management. He is evaluating recent description of aura with scheduled carotid Dopplers. Her primary sleep complaint remains difficulty initiating and maintaining sleep/insomnia. Obstructive sleep apnea has been identified and we have worked on it persistently. Unfortunately she has not tolerated CPAP or Provent nasal valves, and she has not been able to lose weight. Her dentist is interested in trying to make an oral appliance but apparently has limited experience with this. I have offered to send her to someone who has done a lot more of this kind of therapy. Restless legs have been identified in the past but does not seem to be a major clinical problem. Allergic rhinitis and conjunctivitis are managed symptomatically. Most recently she  had Pataday.  02/21/11- 66yoF followed here for allergic rhinitis/ conjunctivitis, and for insomnia with obstructive sleep apnea, complicated by history of migraine, hypertension, GERD. Since last here in August she has had no more paresthesias. She has given up on CPAP and Provent. nasal valves. Insomnia remains her primary sleep complaint. We have discussed this repeatedly and tried a number of approaches. Basic sleep hygiene is good. Fortunately workup for abnormal breast exam turned out benign. In the last 2-3 weeks she feels fall seasonal allergies bothering her. Eyes itch, water. Ears itch. She has a sore inside her right nostril from blowing her nose a lot. Clear mucus discharge. Denies chest tightness or wheeze.  05/02/11- 66yoF followed here for allergic rhinitis/ conjunctivitis, and for insomnia with obstructive sleep apnea, complicated by history of migraine, hypertension, GERD. Using clonazepam when needed for her insomnia. Failed to tolerate CPAP with multiple efforts, and the Provent nasal valves. Unable to lose weight. Lives alone with no reports of snoring or apnea. Did not get a dental oral device. Eyes have watered some, with little itch, sneeze or nasal congestion. Denies wheeze or cough.   07/04/11- 66yoF followed here for allergic rhinitis/ conjunctivitis, and for insomnia with obstructive sleep apnea/ failed CPAP, complicated by history of migraine, hypertension, GERD. Insomnia remains her primary sleep complaint. She is alone with no reports of snoring, witnessed apnea, or sleep disturbance by limb movements. We have discussed available treatments again. Seasonal rhinitis complaints are not yet the problem. We have reviewed symptomatic therapy is available if needed later in the spring. She is anticipating surgery for breast lump but does not know details yet. We do not anticipate  any respiratory concerns that would affect that surgery.  09/05/11- 66yoF followed here for  allergic rhinitis/ conjunctivitis, and for insomnia with obstructive sleep apnea/ failed CPAP, complicated by history of migraine, hypertension, GERD. Breast lump was a benign lipoma but she is not sure was adequately excised. Intermittent antihistamine. Never really sleeps well or without interruption. Pattern has not changed.  11/07/11- 66yoF followed here for allergic rhinitis/ conjunctivitis, and for insomnia with obstructive sleep apnea/ failed CPAP, complicated by history of migraine, hypertension, GERD. Pt still not using cpap and would like something new. We talked again about available treatments including oral appliances, nasal valves. We feel it is time for reassessment and she should qualify for a home unattended sleep study. Her biggest sleep complaint has always been difficulty initiating and maintaining sleep, mostly with difficulty relaxing. I suggested formal referral for a trial at [facility]. She is playing tennis and walking regularly with no acute problems.  01/11/12- 66 yoF never smoker followed here for allergic rhinitis/ conjunctivitis, and for insomnia with obstructive sleep apnea/ failed CPAP, complicated by history of migraine, hypertension, GERD. Her primary concern continues to be insomnia with difficulty initiating and maintaining sleep. She tried a wide Friday of interventions for sleep apnea but none improved her quality of life or ability to sleep well at night. 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 This creates additional stress, as do significant health problems in her family. We have discussed the impact on her sleep quality. She expresses intent to follow through with the cognitive behavioral therapy referral we had made. Allergy symptoms are generally controlled with OTC antihistamines now spring was the worst time of year.  03/12/12-  66 yoF never smoker followed here for allergic rhinitis/  conjunctivitis, and for insomnia with obstructive sleep apnea/ failed CPAP, complicated by history of migraine, hypertension, GERD. Still not sleeping well and would like to discuss her sleep med. Primary complaint is difficulty initiating and maintaining sleep. We have repeatedly reviewed sleep hygiene. Several medications tried as noted in record. She describes 2 hour sleep latency with Xanax 0.5 mg. Sleep soundly after she finally falls asleep. Lives alone. Stressful, rather political job with [employer].  Review of Systems-see HPI Constitutional:   No-   weight loss, night sweats, fevers, chills, fatigue, lassitude. + insomnia HEENT:   No-  headaches, difficulty swallowing, tooth/dental problems, sore throat,       No sneezing, itching, ears itch, nasal congestion, post nasal drip,  CV:  No-   chest pain, orthopnea, PND, swelling in lower extremities, anasarca, dizziness, palpitations Resp: No-   shortness of breath with exertion or at rest.              No-   productive cough,  No non-productive cough,  No-  coughing up of blood.              No-   change in color of mucus.  No- wheezing.   Skin: No-   rash or lesions. GI:  No-   heartburn, indigestion, abdominal pain, nausea, vomiting,  GU: MS:  No-   joint pain or swelling.   Neuro- :  Psych:  No- change in mood or affect. Some- depression or anxiety.  No memory loss.   Physical Exam  General- Alert, Oriented, Affect-appropriate, very pleasant, Distress- none acute, overweight Skin- rash-none, lesions- none, excoriation- none Lymphadenopathy- none Head- atraumatic  Eyes- Gross vision intact, PERRLA, conjunctivae clear secretions            Ears- Hearing, canals normal            Nose- , no -Septal dev, mucus, polyps, erosion, perforation. No turbinate edema or mucus bridging.            Throat- Mallampati III , mucosa clear , drainage- none, tonsils- atrophic Neck- flexible , trachea midline, no stridor ,  thyroid nl, carotid no bruit Chest - symmetrical excursion , unlabored           Heart/CV- RRR , no murmur , no gallop  , no rub, nl s1 s2                           - JVD- none , edema- none, stasis changes- none, varices- none           Lung- clear to P&A, wheeze- none, cough- none , dullness-none, rub- none           Chest wall-  Abd- Br/ Gen/ Rectal- Not done, not indicated Extrem- cyanosis- none, clubbing, none, atrophy- none, strength- nl Neuro- grossly intact to observation              Patient ID: Teresa Molina, female    DOB: Apr 18, 1946, 66 y.o.   MRN: [id number]  HPI - 72yoF followed here for allergic rhinitis/ conjunctivitis, and for insomnia with obstructive sleep apnea.  She has not tolerated CPAP well and has been educated on alternatives. Currently she has a trial set of Provent nasal valves and is still very tentative, but trying. Her tendency is toward "busy-brain" insomnia with difficulty initiating sleep. She is also interested in trying a Silent Night oral appliance offered by her dentist.  She has not been able to lose weight.  For seasonal allergic irritation of eyes, watering and itching, she asks sample Pataday.  10/11/10- OSA, insomnia, allergic rhinitis Provent didn't work out well and she still felt she couldn't sleep throught the night. She is going to look into an oral appliance with her dentist.  Weight loss and sleep hygiene goals reviewed. Spring associated nasal congestion and watery eyes are getting better.  12/13/10- 66yoF followed here for allergic rhinitis/ conjunctivitis, and for insomnia with obstructive sleep apnea, complicated by history of migraine, hypertension, GERD. She follows with Dr. Rex Kras for blood pressure management. He is evaluating recent description of aura with scheduled carotid Dopplers. Her primary sleep complaint remains difficulty initiating and maintaining sleep/insomnia. Obstructive sleep apnea has been identified and we have  worked on it persistently. Unfortunately she has not tolerated CPAP or Provent nasal valves, and she has not been able to lose weight. Her dentist is interested in trying to make an oral appliance but apparently has limited experience with this. I have offered to send her to someone who has done a lot more of this kind of therapy. Restless legs have been identified in the past but does not seem to be a major clinical problem. Allergic rhinitis and conjunctivitis are managed symptomatically. Most recently she had Pataday.  02/21/11- 66yoF followed here for allergic rhinitis/ conjunctivitis, and for insomnia with obstructive sleep apnea, complicated by history of migraine, hypertension, GERD. Since last here in August she has had no more paresthesias. She has given up on CPAP and Provent. nasal valves. Insomnia remains her primary sleep complaint. We have discussed this repeatedly and tried a number of approaches.  Basic sleep hygiene is good. Fortunately workup for abnormal breast exam turned out benign. In the last 2-3 weeks she feels fall seasonal allergies bothering her. Eyes itch, water. Ears itch. She has a sore inside her right nostril from blowing her nose a lot. Clear mucus discharge. Denies chest tightness or wheeze.  05/02/11- 66yoF followed here for allergic rhinitis/ conjunctivitis, and for insomnia with obstructive sleep apnea, complicated by history of migraine, hypertension, GERD. Using clonazepam when needed for her insomnia. Failed to tolerate CPAP with multiple efforts, and the Provent nasal valves. Unable to lose weight. Lives alone with no reports of snoring or apnea. Did not get a dental oral device. Eyes have watered some, with little itch, sneeze or nasal congestion. Denies wheeze or cough.   07/04/11- 66yoF followed here for allergic rhinitis/ conjunctivitis, and for insomnia with obstructive sleep apnea/ failed CPAP, complicated by history of migraine, hypertension, GERD. Insomnia  remains her primary sleep complaint. She is alone with no reports of snoring, witnessed apnea, or sleep disturbance by limb movements. We have discussed available treatments again. Seasonal rhinitis complaints are not yet the problem. We have reviewed symptomatic therapy is available if needed later in the spring. She is anticipating surgery for breast lump but does not know details yet. We do not anticipate any respiratory concerns that would affect that surgery.  09/05/11- 66yoF followed here for allergic rhinitis/ conjunctivitis, and for insomnia with obstructive sleep apnea/ failed CPAP, complicated by history of migraine, hypertension, GERD. Breast lump was a benign lipoma but she is not sure was adequately excised. Intermittent antihistamine. Never really sleeps well or without interruption. Pattern has not changed.  11/07/11- 66yoF followed here for allergic rhinitis/ conjunctivitis, and for insomnia with obstructive sleep apnea/ failed CPAP, complicated by history of migraine, hypertension, GERD. Pt still not using cpap and would like something new. We talked again about available treatments including oral appliances, nasal valves. We feel it is time for reassessment and she should qualify for a home unattended sleep study. Her biggest sleep complaint has always been difficulty initiating and maintaining sleep, mostly with difficulty relaxing. I suggested formal referral for a trial at [facility]. She is playing tennis and walking regularly with no acute problems.  01/11/12- 66 yoF never smoker followed here for allergic rhinitis/ conjunctivitis, and for insomnia with obstructive sleep apnea/ failed CPAP, complicated by history of migraine, hypertension, GERD. Her primary concern continues to be insomnia with difficulty initiating and maintaining sleep. She tried a wide Friday of interventions for sleep apnea but none improved her quality of life or ability to sleep well at  night. 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 This creates additional stress, as do significant health problems in her family. We have discussed the impact on her sleep quality. She expresses intent to follow through with the cognitive behavioral therapy referral we had made. Allergy symptoms are generally controlled with OTC antihistamines now spring was the worst time of year.  03/12/12-  73 yoF never smoker followed here for allergic rhinitis/ conjunctivitis, and for insomnia with obstructive sleep apnea/ failed CPAP, complicated by history of migraine, hypertension, GERD. Still not sleeping well and would like to discuss her sleep med. Primary complaint is difficulty initiating and maintaining sleep. We have repeatedly reviewed sleep hygiene. Several medications tried as noted in record. She describes 2 hour sleep latency with Xanax 0.5 mg. Sleep soundly after she finally falls asleep. Lives alone. Stressful, rather political  job with [employer] of [address].  04/21/12- 65 yoF never smoker followed here for allergic rhinitis/ conjunctivitis, and for insomnia with obstructive sleep apnea/ failed CPAP, complicated by history of migraine, hypertension, GERD. ACUTE VISIT: started last week with GI upset, sore throat on Wenesday/Thursday, lost voice, cough-productive, pressure/pain in Right ear. Denies any fever but has had chills. Little loose phlegm, cough is mostly dry.. Taking supplemental vitamin D. Strep smear negative at her primary physician's office. Some chilling but no fever. GI now okay.  Review of Systems-see HPI Constitutional:   No-   weight loss, night sweats, fevers, chills, fatigue, lassitude. + insomnia HEENT:   No-  headaches, difficulty swallowing, tooth/dental problems, +sore throat,       No sneezing, itching, +ears itch, +nasal congestion, post nasal drip,  CV:  No-   chest pain, orthopnea, PND, swelling in lower extremities, anasarca,  dizziness, palpitations Resp: No-   shortness of breath with exertion or at rest.              No-   productive cough,  + non-productive cough,  No-  coughing up of blood.              No-   change in color of mucus.  No- wheezing.   Skin: No-   rash or lesions. GI:  No-   heartburn, indigestion, abdominal pain, nausea, vomiting,  GU: MS:  No-   joint pain or swelling.   Neuro- :  Psych:  No- change in mood or affect. Some- depression or anxiety.  No memory loss.   Physical Exam  General- Alert, Oriented, Affect-appropriate, very pleasant, Distress- none acute, overweight Skin- rash-none, lesions- none, excoriation- none Lymphadenopathy- none Head- atraumatic            Eyes- Gross vision intact, PERRLA, conjunctivae clear secretions            Ears- Hearing, canals normal            Nose- , no -Septal dev, mucus, polyps, erosion, perforation. No turbinate edema or mucus bridging.            Throat- Mallampati III , mucosa clear-not read , drainage- none, tonsils- atrophic  Hoarse Neck- flexible , trachea midline, no stridor , thyroid nl, carotid no bruit Chest - symmetrical excursion , unlabored           Heart/CV- RRR , no murmur , no gallop  , no rub, nl s1 s2                           - JVD- none , edema- none, stasis changes- none, varices- none           Lung- clear to P&A, wheeze- none, +cough dry , dullness-none, rub- none           Chest wall-  Abd- Br/ Gen/ Rectal- Not done, not indicated Extrem- cyanosis- none, clubbing, none, atrophy- none, strength- nl Neuro- grossly intact to observation

## 2012-04-27 NOTE — Assessment & Plan Note (Signed)
Upper respiratory infection with tracheobronchitis  Plan- fluids, voice rest, Depo-Medrol, prednisone taper to hold, Z-Pak, medication discussion

## 2012-05-12 ENCOUNTER — Encounter: Payer: Self-pay | Admitting: Internal Medicine

## 2012-05-12 ENCOUNTER — Ambulatory Visit (INDEPENDENT_AMBULATORY_CARE_PROVIDER_SITE_OTHER): Payer: Medicare Other | Admitting: Internal Medicine

## 2012-05-12 VITALS — BP 118/80 | HR 75 | Ht 63.0 in | Wt 168.8 lb

## 2012-05-12 DIAGNOSIS — G4700 Insomnia, unspecified: Secondary | ICD-10-CM

## 2012-05-12 DIAGNOSIS — G4730 Sleep apnea, unspecified: Secondary | ICD-10-CM

## 2012-05-12 DIAGNOSIS — J209 Acute bronchitis, unspecified: Secondary | ICD-10-CM

## 2012-05-12 MED ORDER — FLUCONAZOLE 150 MG PO TABS: 150.0000 mg | ORAL_TABLET | Freq: Once | ORAL | Status: DC

## 2012-05-12 NOTE — Patient Instructions (Addendum)
Script for diflucan sent

## 2012-05-12 NOTE — Progress Notes (Signed)
Patient ID: Teresa Molina, female    DOB: December 18, 1945, 66 y.o.   MRN: [id number]  HPI - [name] followed here for allergic rhinitis/ conjunctivitis, and for insomnia with obstructive sleep apnea.  She has not tolerated CPAP well and has been educated on alternatives. Currently she has a trial set of Provent nasal valves and is still very tentative, but trying. Her tendency is toward "busy-brain" insomnia with difficulty initiating sleep. She is also interested in trying a Silent Night oral appliance offered by her dentist.  She has not been able to lose weight.  For seasonal allergic irritation of eyes, watering and itching, she asks sample Pataday.  10/11/10- OSA, insomnia, allergic rhinitis Provent didn't work out well and she still felt she couldn't sleep throught the night. She is going to look into an oral appliance with her dentist.  Weight loss and sleep hygiene goals reviewed. Spring associated nasal congestion and watery eyes are getting better.  12/13/10- [name] followed here for allergic rhinitis/ conjunctivitis, and for insomnia with obstructive sleep apnea, complicated by history of migraine, hypertension, GERD. She follows with Dr. Rex Kras for blood pressure management. He is evaluating recent description of aura with scheduled carotid Dopplers. Her primary sleep complaint remains difficulty initiating and maintaining sleep/insomnia. Obstructive sleep apnea has been identified and we have worked on it persistently. Unfortunately she has not tolerated CPAP or Provent nasal valves, and she has not been able to lose weight. Her dentist is interested in trying to make an oral appliance but apparently has limited experience with this. I have offered to send her to someone who has done a lot more of this kind of therapy. Restless legs have been identified in the past but does not seem to be a major clinical problem. Allergic rhinitis and conjunctivitis are managed symptomatically. Most recently she  had Pataday.  02/21/11- [name] followed here for allergic rhinitis/ conjunctivitis, and for insomnia with obstructive sleep apnea, complicated by history of migraine, hypertension, GERD. Since last here in August she has had no more paresthesias. She has given up on CPAP and Provent. nasal valves. Insomnia remains her primary sleep complaint. We have discussed this repeatedly and tried a number of approaches. Basic sleep hygiene is good. Fortunately workup for abnormal breast exam turned out benign. In the last 2-3 weeks she feels fall seasonal allergies bothering her. Eyes itch, water. Ears itch. She has a sore inside her right nostril from blowing her nose a lot. Clear mucus discharge. Denies chest tightness or wheeze.  05/02/11- [name] followed here for allergic rhinitis/ conjunctivitis, and for insomnia with obstructive sleep apnea, complicated by history of migraine, hypertension, GERD. Using clonazepam when needed for her insomnia. Failed to tolerate CPAP with multiple efforts, and the Provent nasal valves. Unable to lose weight. Lives alone with no reports of snoring or apnea. Did not get a dental oral device. Eyes have watered some, with little itch, sneeze or nasal congestion. Denies wheeze or cough.   07/04/11- [name] followed here for allergic rhinitis/ conjunctivitis, and for insomnia with obstructive sleep apnea/ failed CPAP, complicated by history of migraine, hypertension, GERD. Insomnia remains her primary sleep complaint. She is alone with no reports of snoring, witnessed apnea, or sleep disturbance by limb movements. We have discussed available treatments again. Seasonal rhinitis complaints are not yet the problem. We have reviewed symptomatic therapy is available if needed later in the spring. She is anticipating surgery for breast lump but does not know details yet. We do not anticipate  any respiratory concerns that would affect that surgery.  09/05/11- [name] followed here for  allergic rhinitis/ conjunctivitis, and for insomnia with obstructive sleep apnea/ failed CPAP, complicated by history of migraine, hypertension, GERD. Breast lump was a benign lipoma but she is not sure was adequately excised. Intermittent antihistamine. Never really sleeps well or without interruption. Pattern has not changed.  11/07/11- [name] followed here for allergic rhinitis/ conjunctivitis, and for insomnia with obstructive sleep apnea/ failed CPAP, complicated by history of migraine, hypertension, GERD. Pt still not using cpap and would like something new. We talked again about available treatments including oral appliances, nasal valves. We feel it is time for reassessment and she should qualify for a home unattended sleep study. Her biggest sleep complaint has always been difficulty initiating and maintaining sleep, mostly with difficulty relaxing. I suggested formal referral for a trial at [facility]. She is playing tennis and walking regularly with no acute problems.  01/11/12- 65 yoF never smoker followed here for allergic rhinitis/ conjunctivitis, and for insomnia with obstructive sleep apnea/ failed CPAP, complicated by history of migraine, hypertension, GERD. Her primary concern continues to be insomnia with difficulty initiating and maintaining sleep. She tried a wide Friday of interventions for sleep apnea but none improved her quality of life or ability to sleep well at night. 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 This creates additional stress, as do significant health problems in her family. We have discussed the impact on her sleep quality. She expresses intent to follow through with the cognitive behavioral therapy referral we had made. Allergy symptoms are generally controlled with OTC antihistamines now spring was the worst time of year.  03/12/12-  55 yoF never smoker followed here for allergic rhinitis/  conjunctivitis, and for insomnia with obstructive sleep apnea/ failed CPAP, complicated by history of migraine, hypertension, GERD. Still not sleeping well and would like to discuss her sleep med. Primary complaint is difficulty initiating and maintaining sleep. We have repeatedly reviewed sleep hygiene. Several medications tried as noted in record. She describes 2 hour sleep latency with Xanax 0.5 mg. Sleep soundly after she finally falls asleep. Lives alone. Stressful, rather political job with [employer].  Review of Systems-see HPI Constitutional:   No-   weight loss, night sweats, fevers, chills, fatigue, lassitude. + insomnia HEENT:   No-  headaches, difficulty swallowing, tooth/dental problems, sore throat,       No sneezing, itching, ears itch, nasal congestion, post nasal drip,  CV:  No-   chest pain, orthopnea, PND, swelling in lower extremities, anasarca, dizziness, palpitations Resp: No-   shortness of breath with exertion or at rest.              No-   productive cough,  No non-productive cough,  No-  coughing up of blood.              No-   change in color of mucus.  No- wheezing.   Skin: No-   rash or lesions. GI:  No-   heartburn, indigestion, abdominal pain, nausea, vomiting,  GU: MS:  No-   joint pain or swelling.   Neuro- :  Psych:  No- change in mood or affect. Some- depression or anxiety.  No memory loss.   Physical Exam  General- Alert, Oriented, Affect-appropriate, very pleasant, Distress- none acute, overweight Skin- rash-none, lesions- none, excoriation- none Lymphadenopathy- none Head- atraumatic  Eyes- Gross vision intact, PERRLA, conjunctivae clear secretions            Ears- Hearing, canals normal            Nose- , no -Septal dev, mucus, polyps, erosion, perforation. No turbinate edema or mucus bridging.            Throat- Mallampati III , mucosa clear , drainage- none, tonsils- atrophic Neck- flexible , trachea midline, no stridor ,  thyroid nl, carotid no bruit Chest - symmetrical excursion , unlabored           Heart/CV- RRR , no murmur , no gallop  , no rub, nl s1 s2                           - JVD- none , edema- none, stasis changes- none, varices- none           Lung- clear to P&A, wheeze- none, cough- none , dullness-none, rub- none           Chest wall-  Abd- Br/ Gen/ Rectal- Not done, not indicated Extrem- cyanosis- none, clubbing, none, atrophy- none, strength- nl Neuro- grossly intact to observation              Patient ID: Vita Barley, female    DOB: Apr 18, 1946, 66 y.o.   MRN: [id number]  HPI - 72yoF followed here for allergic rhinitis/ conjunctivitis, and for insomnia with obstructive sleep apnea.  She has not tolerated CPAP well and has been educated on alternatives. Currently she has a trial set of Provent nasal valves and is still very tentative, but trying. Her tendency is toward "busy-brain" insomnia with difficulty initiating sleep. She is also interested in trying a Silent Night oral appliance offered by her dentist.  She has not been able to lose weight.  For seasonal allergic irritation of eyes, watering and itching, she asks sample Pataday.  10/11/10- OSA, insomnia, allergic rhinitis Provent didn't work out well and she still felt she couldn't sleep throught the night. She is going to look into an oral appliance with her dentist.  Weight loss and sleep hygiene goals reviewed. Spring associated nasal congestion and watery eyes are getting better.  12/13/10- [name] followed here for allergic rhinitis/ conjunctivitis, and for insomnia with obstructive sleep apnea, complicated by history of migraine, hypertension, GERD. She follows with Dr. Rex Kras for blood pressure management. He is evaluating recent description of aura with scheduled carotid Dopplers. Her primary sleep complaint remains difficulty initiating and maintaining sleep/insomnia. Obstructive sleep apnea has been identified and we have  worked on it persistently. Unfortunately she has not tolerated CPAP or Provent nasal valves, and she has not been able to lose weight. Her dentist is interested in trying to make an oral appliance but apparently has limited experience with this. I have offered to send her to someone who has done a lot more of this kind of therapy. Restless legs have been identified in the past but does not seem to be a major clinical problem. Allergic rhinitis and conjunctivitis are managed symptomatically. Most recently she had Pataday.  02/21/11- [name] followed here for allergic rhinitis/ conjunctivitis, and for insomnia with obstructive sleep apnea, complicated by history of migraine, hypertension, GERD. Since last here in August she has had no more paresthesias. She has given up on CPAP and Provent. nasal valves. Insomnia remains her primary sleep complaint. We have discussed this repeatedly and tried a number of approaches.  Basic sleep hygiene is good. Fortunately workup for abnormal breast exam turned out benign. In the last 2-3 weeks she feels fall seasonal allergies bothering her. Eyes itch, water. Ears itch. She has a sore inside her right nostril from blowing her nose a lot. Clear mucus discharge. Denies chest tightness or wheeze.  05/02/11- [name] followed here for allergic rhinitis/ conjunctivitis, and for insomnia with obstructive sleep apnea, complicated by history of migraine, hypertension, GERD. Using clonazepam when needed for her insomnia. Failed to tolerate CPAP with multiple efforts, and the Provent nasal valves. Unable to lose weight. Lives alone with no reports of snoring or apnea. Did not get a dental oral device. Eyes have watered some, with little itch, sneeze or nasal congestion. Denies wheeze or cough.   07/04/11- [name] followed here for allergic rhinitis/ conjunctivitis, and for insomnia with obstructive sleep apnea/ failed CPAP, complicated by history of migraine, hypertension, GERD. Insomnia  remains her primary sleep complaint. She is alone with no reports of snoring, witnessed apnea, or sleep disturbance by limb movements. We have discussed available treatments again. Seasonal rhinitis complaints are not yet the problem. We have reviewed symptomatic therapy is available if needed later in the spring. She is anticipating surgery for breast lump but does not know details yet. We do not anticipate any respiratory concerns that would affect that surgery.  09/05/11- [name] followed here for allergic rhinitis/ conjunctivitis, and for insomnia with obstructive sleep apnea/ failed CPAP, complicated by history of migraine, hypertension, GERD. Breast lump was a benign lipoma but she is not sure was adequately excised. Intermittent antihistamine. Never really sleeps well or without interruption. Pattern has not changed.  11/07/11- [name] followed here for allergic rhinitis/ conjunctivitis, and for insomnia with obstructive sleep apnea/ failed CPAP, complicated by history of migraine, hypertension, GERD. Pt still not using cpap and would like something new. We talked again about available treatments including oral appliances, nasal valves. We feel it is time for reassessment and she should qualify for a home unattended sleep study. Her biggest sleep complaint has always been difficulty initiating and maintaining sleep, mostly with difficulty relaxing. I suggested formal referral for a trial at [facility]. She is playing tennis and walking regularly with no acute problems.  01/11/12- 65 yoF never smoker followed here for allergic rhinitis/ conjunctivitis, and for insomnia with obstructive sleep apnea/ failed CPAP, complicated by history of migraine, hypertension, GERD. Her primary concern continues to be insomnia with difficulty initiating and maintaining sleep. She tried a wide Friday of interventions for sleep apnea but none improved her quality of life or ability to sleep well at  night. 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 This creates additional stress, as do significant health problems in her family. We have discussed the impact on her sleep quality. She expresses intent to follow through with the cognitive behavioral therapy referral we had made. Allergy symptoms are generally controlled with OTC antihistamines now spring was the worst time of year.  03/12/12-  73 yoF never smoker followed here for allergic rhinitis/ conjunctivitis, and for insomnia with obstructive sleep apnea/ failed CPAP, complicated by history of migraine, hypertension, GERD. Still not sleeping well and would like to discuss her sleep med. Primary complaint is difficulty initiating and maintaining sleep. We have repeatedly reviewed sleep hygiene. Several medications tried as noted in record. She describes 2 hour sleep latency with Xanax 0.5 mg. Sleep soundly after she finally falls asleep. Lives alone. Stressful, rather political  job with [employer] of [address].  04/21/12- 65 yoF never smoker followed here for allergic rhinitis/ conjunctivitis, and for insomnia with obstructive sleep apnea/ failed CPAP, complicated by history of migraine, hypertension, GERD. ACUTE VISIT: started last week with GI upset, sore throat on Wenesday/Thursday, lost voice, cough-productive, pressure/pain in Right ear. Denies any fever but has had chills. Little loose phlegm, cough is mostly dry.. Taking supplemental vitamin D. Strep smear negative at her primary physician's office. Some chilling but no fever. GI now okay.  05/12/12  66 yoF never smoker followed here for allergic rhinitis/ conjunctivitis, and for insomnia with obstructive sleep apnea/ failed CPAP and Provent, complicated by history of migraine, hypertension, GERD. FOLLOWS FOR: occasional dry cough; denies any wheezing or SOB.  She started a Z-Pak yesterday we'll recent rhonchi does without fever. Did have flu  shot. She asks Diflucan available because of antibiotic/yeast. We discussed insomnia management again. She is alone and not aware of snoring. I mentioned a book I had found which might help.  Review of Systems-see HPI Constitutional:   No-   weight loss, night sweats, fevers, chills, fatigue, lassitude. + insomnia HEENT:   No-  headaches, difficulty swallowing, tooth/dental problems, +sore throat,       No sneezing, itching, +ears itch, +nasal congestion, post nasal drip,  CV:  No-   chest pain, orthopnea, PND, swelling in lower extremities, anasarca, dizziness, palpitations Resp: No-   shortness of breath with exertion or at rest.              +  productive cough,  + non-productive cough,  No-  coughing up of blood.              No-   change in color of mucus.  No- wheezing.   Skin: No-   rash or lesions. GI:  No-   heartburn, indigestion, abdominal pain, nausea, vomiting,  GU: MS:  No-   joint pain or swelling.   Neuro- :  Psych:  No- change in mood or affect. Some- depression or anxiety.  No memory loss.   Physical Exam  General- Alert, Oriented, Affect-appropriate, very pleasant, Distress- none acute, overweight Skin- rash-none, lesions- none, excoriation- none Lymphadenopathy- none Head- atraumatic            Eyes- Gross vision intact, PERRLA, conjunctivae clear secretions            Ears- Hearing, canals normal            Nose- , no -Septal dev, mucus, polyps, erosion, perforation. No turbinate edema or mucus bridging.            Throat- Mallampati III , mucosa clear-not read , drainage- none, tonsils- atrophic  Hoarse Neck- flexible , trachea midline, no stridor , thyroid nl, carotid no bruit Chest - symmetrical excursion , unlabored           Heart/CV- RRR , no murmur , no gallop  , no rub, nl s1 s2                           - JVD- none , edema- none, stasis changes- none, varices- none           Lung- clear to P&A, wheeze- none, +cough  , dullness-none, rub- none            Chest wall-  Abd- Br/ Gen/ Rectal- Not done, not indicated Extrem- cyanosis- none, clubbing, none, atrophy- none, strength-  nl Neuro- grossly intact to observation

## 2012-05-13 ENCOUNTER — Ambulatory Visit: Payer: Medicare Other | Admitting: Internal Medicine

## 2012-05-23 NOTE — Assessment & Plan Note (Signed)
Now taking Z-Pak. We discussed fluids and Mucinex. Diflucan in case needed for yeast.

## 2012-05-23 NOTE — Assessment & Plan Note (Signed)
Plan-suggest she look at a book-"I can't sleep" by [employer] from [employer].

## 2012-06-27 ENCOUNTER — Other Ambulatory Visit: Payer: Self-pay | Admitting: Gynecology

## 2012-07-23 ENCOUNTER — Ambulatory Visit: Payer: Medicare Other | Admitting: Internal Medicine

## 2012-08-07 ENCOUNTER — Telehealth: Payer: Self-pay | Admitting: Internal Medicine

## 2012-08-07 MED ORDER — AZITHROMYCIN 250 MG PO TABS: ORAL_TABLET | ORAL | Status: DC

## 2012-08-07 NOTE — Telephone Encounter (Signed)
Acute tracheobronchitis, sore throat. Asks Z pak. Supportive measures. Called per request to [phone]

## 2012-08-12 ENCOUNTER — Ambulatory Visit: Payer: Medicare Other | Admitting: Internal Medicine

## 2012-09-16 ENCOUNTER — Other Ambulatory Visit: Payer: Self-pay | Admitting: Dermatology

## 2012-09-19 ENCOUNTER — Ambulatory Visit: Payer: Medicare Other | Admitting: Internal Medicine

## 2012-09-25 ENCOUNTER — Encounter: Payer: Self-pay | Admitting: Internal Medicine

## 2012-09-25 ENCOUNTER — Ambulatory Visit (INDEPENDENT_AMBULATORY_CARE_PROVIDER_SITE_OTHER): Payer: Medicare Other | Admitting: Internal Medicine

## 2012-09-25 VITALS — BP 124/80 | HR 85 | Ht 62.0 in | Wt 172.8 lb

## 2012-09-25 DIAGNOSIS — J302 Other seasonal allergic rhinitis: Secondary | ICD-10-CM

## 2012-09-25 DIAGNOSIS — J309 Allergic rhinitis, unspecified: Secondary | ICD-10-CM

## 2012-09-25 DIAGNOSIS — G4700 Insomnia, unspecified: Secondary | ICD-10-CM

## 2012-09-25 DIAGNOSIS — G4730 Sleep apnea, unspecified: Secondary | ICD-10-CM

## 2012-09-25 MED ORDER — CLONAZEPAM 0.5 MG PO TABS: ORAL_TABLET | ORAL | Status: DC

## 2012-09-25 NOTE — Patient Instructions (Addendum)
Script for clonazepam- try up to 4 tabs, taken 1/2 hr before sleep, if needed  Please call as needed

## 2012-09-25 NOTE — Progress Notes (Signed)
Patient ID: Teresa Molina, female    DOB: December 18, 1945, 67 y.o.   MRN: [id number]  HPI - [name] followed here for allergic rhinitis/ conjunctivitis, and for insomnia with obstructive sleep apnea.  She has not tolerated CPAP well and has been educated on alternatives. Currently she has a trial set of Provent nasal valves and is still very tentative, but trying. Her tendency is toward "busy-brain" insomnia with difficulty initiating sleep. She is also interested in trying a Silent Night oral appliance offered by her dentist.  She has not been able to lose weight.  For seasonal allergic irritation of eyes, watering and itching, she asks sample Pataday.  10/11/10- OSA, insomnia, allergic rhinitis Provent didn't work out well and she still felt she couldn't sleep throught the night. She is going to look into an oral appliance with her dentist.  Weight loss and sleep hygiene goals reviewed. Spring associated nasal congestion and watery eyes are getting better.  12/13/10- 67yoF followed here for allergic rhinitis/ conjunctivitis, and for insomnia with obstructive sleep apnea, complicated by history of migraine, hypertension, GERD. She follows with Dr. Rex Kras for blood pressure management. He is evaluating recent description of aura with scheduled carotid Dopplers. Her primary sleep complaint remains difficulty initiating and maintaining sleep/insomnia. Obstructive sleep apnea has been identified and we have worked on it persistently. Unfortunately she has not tolerated CPAP or Provent nasal valves, and she has not been able to lose weight. Her dentist is interested in trying to make an oral appliance but apparently has limited experience with this. I have offered to send her to someone who has done a lot more of this kind of therapy. Restless legs have been identified in the past but does not seem to be a major clinical problem. Allergic rhinitis and conjunctivitis are managed symptomatically. Most recently she  had Pataday.  02/21/11- 67yoF followed here for allergic rhinitis/ conjunctivitis, and for insomnia with obstructive sleep apnea, complicated by history of migraine, hypertension, GERD. Since last here in August she has had no more paresthesias. She has given up on CPAP and Provent. nasal valves. Insomnia remains her primary sleep complaint. We have discussed this repeatedly and tried a number of approaches. Basic sleep hygiene is good. Fortunately workup for abnormal breast exam turned out benign. In the last 2-3 weeks she feels fall seasonal allergies bothering her. Eyes itch, water. Ears itch. She has a sore inside her right nostril from blowing her nose a lot. Clear mucus discharge. Denies chest tightness or wheeze.  05/02/11- 67yoF followed here for allergic rhinitis/ conjunctivitis, and for insomnia with obstructive sleep apnea, complicated by history of migraine, hypertension, GERD. Using clonazepam when needed for her insomnia. Failed to tolerate CPAP with multiple efforts, and the Provent nasal valves. Unable to lose weight. Lives alone with no reports of snoring or apnea. Did not get a dental oral device. Eyes have watered some, with little itch, sneeze or nasal congestion. Denies wheeze or cough.   07/04/11- 67yoF followed here for allergic rhinitis/ conjunctivitis, and for insomnia with obstructive sleep apnea/ failed CPAP, complicated by history of migraine, hypertension, GERD. Insomnia remains her primary sleep complaint. She is alone with no reports of snoring, witnessed apnea, or sleep disturbance by limb movements. We have discussed available treatments again. Seasonal rhinitis complaints are not yet the problem. We have reviewed symptomatic therapy is available if needed later in the spring. She is anticipating surgery for breast lump but does not know details yet. We do not anticipate  any respiratory concerns that would affect that surgery.  09/05/11- 67yoF followed here for  allergic rhinitis/ conjunctivitis, and for insomnia with obstructive sleep apnea/ failed CPAP, complicated by history of migraine, hypertension, GERD. Breast lump was a benign lipoma but she is not sure was adequately excised. Intermittent antihistamine. Never really sleeps well or without interruption. Pattern has not changed.  11/07/11- 67yoF followed here for allergic rhinitis/ conjunctivitis, and for insomnia with obstructive sleep apnea/ failed CPAP, complicated by history of migraine, hypertension, GERD. Pt still not using cpap and would like something new. We talked again about available treatments including oral appliances, nasal valves. We feel it is time for reassessment and she should qualify for a home unattended sleep study. Her biggest sleep complaint has always been difficulty initiating and maintaining sleep, mostly with difficulty relaxing. I suggested formal referral for a trial at [facility]. She is playing tennis and walking regularly with no acute problems.  01/11/12- 67 yoF never smoker followed here for allergic rhinitis/ conjunctivitis, and for insomnia with obstructive sleep apnea/ failed CPAP, complicated by history of migraine, hypertension, GERD. Her primary concern continues to be insomnia with difficulty initiating and maintaining sleep. She tried a wide Friday of interventions for sleep apnea but none improved her quality of life or ability to sleep well at night. 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 This creates additional stress, as do significant health problems in her family. We have discussed the impact on her sleep quality. She expresses intent to follow through with the cognitive behavioral therapy referral we had made. Allergy symptoms are generally controlled with OTC antihistamines now spring was the worst time of year.  03/12/12-  55 yoF never smoker followed here for allergic rhinitis/  conjunctivitis, and for insomnia with obstructive sleep apnea/ failed CPAP, complicated by history of migraine, hypertension, GERD. Still not sleeping well and would like to discuss her sleep med. Primary complaint is difficulty initiating and maintaining sleep. We have repeatedly reviewed sleep hygiene. Several medications tried as noted in record. She describes 2 hour sleep latency with Xanax 0.5 mg. Sleep soundly after she finally falls asleep. Lives alone. Stressful, rather political job with [employer].  Review of Systems-see HPI Constitutional:   No-   weight loss, night sweats, fevers, chills, fatigue, lassitude. + insomnia HEENT:   No-  headaches, difficulty swallowing, tooth/dental problems, sore throat,       No sneezing, itching, ears itch, nasal congestion, post nasal drip,  CV:  No-   chest pain, orthopnea, PND, swelling in lower extremities, anasarca, dizziness, palpitations Resp: No-   shortness of breath with exertion or at rest.              No-   productive cough,  No non-productive cough,  No-  coughing up of blood.              No-   change in color of mucus.  No- wheezing.   Skin: No-   rash or lesions. GI:  No-   heartburn, indigestion, abdominal pain, nausea, vomiting,  GU: MS:  No-   joint pain or swelling.   Neuro- :  Psych:  No- change in mood or affect. Some- depression or anxiety.  No memory loss.   Physical Exam  General- Alert, Oriented, Affect-appropriate, very pleasant, Distress- none acute, overweight Skin- rash-none, lesions- none, excoriation- none Lymphadenopathy- none Head- atraumatic  Eyes- Gross vision intact, PERRLA, conjunctivae clear secretions            Ears- Hearing, canals normal            Nose- , no -Septal dev, mucus, polyps, erosion, perforation. No turbinate edema or mucus bridging.            Throat- Mallampati III , mucosa clear , drainage- none, tonsils- atrophic Neck- flexible , trachea midline, no stridor ,  thyroid nl, carotid no bruit Chest - symmetrical excursion , unlabored           Heart/CV- RRR , no murmur , no gallop  , no rub, nl s1 s2                           - JVD- none , edema- none, stasis changes- none, varices- none           Lung- clear to P&A, wheeze- none, cough- none , dullness-none, rub- none           Chest wall-  Abd- Br/ Gen/ Rectal- Not done, not indicated Extrem- cyanosis- none, clubbing, none, atrophy- none, strength- nl Neuro- grossly intact to observation              Patient ID: Teresa Molina, female    DOB: Apr 18, 1946, 67 y.o.   MRN: [id number]  HPI - 72yoF followed here for allergic rhinitis/ conjunctivitis, and for insomnia with obstructive sleep apnea.  She has not tolerated CPAP well and has been educated on alternatives. Currently she has a trial set of Provent nasal valves and is still very tentative, but trying. Her tendency is toward "busy-brain" insomnia with difficulty initiating sleep. She is also interested in trying a Silent Night oral appliance offered by her dentist.  She has not been able to lose weight.  For seasonal allergic irritation of eyes, watering and itching, she asks sample Pataday.  10/11/10- OSA, insomnia, allergic rhinitis Provent didn't work out well and she still felt she couldn't sleep throught the night. She is going to look into an oral appliance with her dentist.  Weight loss and sleep hygiene goals reviewed. Spring associated nasal congestion and watery eyes are getting better.  12/13/10- 67yoF followed here for allergic rhinitis/ conjunctivitis, and for insomnia with obstructive sleep apnea, complicated by history of migraine, hypertension, GERD. She follows with Dr. Rex Kras for blood pressure management. He is evaluating recent description of aura with scheduled carotid Dopplers. Her primary sleep complaint remains difficulty initiating and maintaining sleep/insomnia. Obstructive sleep apnea has been identified and we have  worked on it persistently. Unfortunately she has not tolerated CPAP or Provent nasal valves, and she has not been able to lose weight. Her dentist is interested in trying to make an oral appliance but apparently has limited experience with this. I have offered to send her to someone who has done a lot more of this kind of therapy. Restless legs have been identified in the past but does not seem to be a major clinical problem. Allergic rhinitis and conjunctivitis are managed symptomatically. Most recently she had Pataday.  02/21/11- 67yoF followed here for allergic rhinitis/ conjunctivitis, and for insomnia with obstructive sleep apnea, complicated by history of migraine, hypertension, GERD. Since last here in August she has had no more paresthesias. She has given up on CPAP and Provent. nasal valves. Insomnia remains her primary sleep complaint. We have discussed this repeatedly and tried a number of approaches.  Basic sleep hygiene is good. Fortunately workup for abnormal breast exam turned out benign. In the last 2-3 weeks she feels fall seasonal allergies bothering her. Eyes itch, water. Ears itch. She has a sore inside her right nostril from blowing her nose a lot. Clear mucus discharge. Denies chest tightness or wheeze.  05/02/11- 67yoF followed here for allergic rhinitis/ conjunctivitis, and for insomnia with obstructive sleep apnea, complicated by history of migraine, hypertension, GERD. Using clonazepam when needed for her insomnia. Failed to tolerate CPAP with multiple efforts, and the Provent nasal valves. Unable to lose weight. Lives alone with no reports of snoring or apnea. Did not get a dental oral device. Eyes have watered some, with little itch, sneeze or nasal congestion. Denies wheeze or cough.   07/04/11- 67yoF followed here for allergic rhinitis/ conjunctivitis, and for insomnia with obstructive sleep apnea/ failed CPAP, complicated by history of migraine, hypertension, GERD. Insomnia  remains her primary sleep complaint. She is alone with no reports of snoring, witnessed apnea, or sleep disturbance by limb movements. We have discussed available treatments again. Seasonal rhinitis complaints are not yet the problem. We have reviewed symptomatic therapy is available if needed later in the spring. She is anticipating surgery for breast lump but does not know details yet. We do not anticipate any respiratory concerns that would affect that surgery.  09/05/11- 67yoF followed here for allergic rhinitis/ conjunctivitis, and for insomnia with obstructive sleep apnea/ failed CPAP, complicated by history of migraine, hypertension, GERD. Breast lump was a benign lipoma but she is not sure was adequately excised. Intermittent antihistamine. Never really sleeps well or without interruption. Pattern has not changed.  11/07/11- 67yoF followed here for allergic rhinitis/ conjunctivitis, and for insomnia with obstructive sleep apnea/ failed CPAP, complicated by history of migraine, hypertension, GERD. Pt still not using cpap and would like something new. We talked again about available treatments including oral appliances, nasal valves. We feel it is time for reassessment and she should qualify for a home unattended sleep study. Her biggest sleep complaint has always been difficulty initiating and maintaining sleep, mostly with difficulty relaxing. I suggested formal referral for a trial at [facility]. She is playing tennis and walking regularly with no acute problems.  01/11/12- 67 yoF never smoker followed here for allergic rhinitis/ conjunctivitis, and for insomnia with obstructive sleep apnea/ failed CPAP, complicated by history of migraine, hypertension, GERD. Her primary concern continues to be insomnia with difficulty initiating and maintaining sleep. She tried a wide Friday of interventions for sleep apnea but none improved her quality of life or ability to sleep well at  night. 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 This creates additional stress, as do significant health problems in her family. We have discussed the impact on her sleep quality. She expresses intent to follow through with the cognitive behavioral therapy referral we had made. Allergy symptoms are generally controlled with OTC antihistamines now spring was the worst time of year.  03/12/12-  73 yoF never smoker followed here for allergic rhinitis/ conjunctivitis, and for insomnia with obstructive sleep apnea/ failed CPAP, complicated by history of migraine, hypertension, GERD. Still not sleeping well and would like to discuss her sleep med. Primary complaint is difficulty initiating and maintaining sleep. We have repeatedly reviewed sleep hygiene. Several medications tried as noted in record. She describes 2 hour sleep latency with Xanax 0.5 mg. Sleep soundly after she finally falls asleep. Lives alone. Stressful, rather political  job with [employer] of [address].  04/21/12- 65 yoF never smoker followed here for allergic rhinitis/ conjunctivitis, and for insomnia with obstructive sleep apnea/ failed CPAP, complicated by history of migraine, hypertension, GERD. ACUTE VISIT: started last week with GI upset, sore throat on Wenesday/Thursday, lost voice, cough-productive, pressure/pain in Right ear. Denies any fever but has had chills. Little loose phlegm, cough is mostly dry.. Taking supplemental vitamin D. Strep smear negative at her primary physician's office. Some chilling but no fever. GI now okay.  05/12/12  66 yoF never smoker followed here for allergic rhinitis/ conjunctivitis, and for insomnia with obstructive sleep apnea/ failed CPAP and Provent, complicated by history of migraine, hypertension, GERD. FOLLOWS FOR: occasional dry cough; denies any wheezing or SOB.  She started a Z-Pak yesterday we'll recent rhonchi does without fever. Did have flu  shot. She asks Diflucan available because of antibiotic/yeast. We discussed insomnia management again. She is alone and not aware of snoring. I mentioned a book I had found which might help.  09/25/12-66 yoF never smoker followed here for allergic rhinitis/ conjunctivitis, and for insomnia with obstructive sleep apnea/ failed CPAP and Provent, complicated by history of migraine, hypertension, GERD. FOLLOWS FOR: watery eyes, slight cough, feels stuffy from pollen Chronic insomnia pattern is not really changed. She has been under some stress recently related to her job for the [employer].  Review of Systems-see HPI Constitutional:   No-   weight loss, night sweats, fevers, chills, fatigue, lassitude. + insomnia HEENT:   No-  headaches, difficulty swallowing, tooth/dental problems, +sore throat,       + sneezing, itching, +ears itch, +nasal congestion, post nasal drip,  CV:  No-   chest pain, orthopnea, PND, swelling in lower extremities, anasarca, dizziness, palpitations Resp: No-   shortness of breath with exertion or at rest.              +  productive cough,  + non-productive cough,  No-  coughing up of blood.              No-   change in color of mucus.  No- wheezing.   Skin: No-   rash or lesions. GI:  No-   heartburn, indigestion, abdominal pain, nausea, vomiting,  GU: MS:  No-   joint pain or swelling.   Neuro- :  Psych:  No- change in mood or affect. Some- depression or anxiety.  No memory loss.   Physical Exam  General- Alert, Oriented, Affect-appropriate, very pleasant, Distress- none acute, overweight Skin- rash-none, lesions- none, excoriation- none Lymphadenopathy- none Head- atraumatic            Eyes- Gross vision intact, PERRLA, conjunctivae clear secretions            Ears- Hearing, canals normal            Nose- , no -Septal dev, mucus, polyps, erosion, perforation. No turbinate edema or mucus bridging.            Throat- Mallampati III , mucosa clear-not read ,  drainage- none, tonsils- atrophic   Neck- flexible , trachea midline, no stridor , thyroid nl, carotid no bruit Chest - symmetrical excursion , unlabored           Heart/CV- RRR , no murmur , no gallop  , no rub, nl s1 s2                           -  JVD- none , edema- none, stasis changes- none, varices- none           Lung- clear to P&A, wheeze- none, no-cough  , dullness-none, rub- none           Chest wall-  Abd- Br/ Gen/ Rectal- Not done, not indicated Extrem- cyanosis- none, clubbing, none, atrophy- none, strength- nl Neuro- grossly intact to observation

## 2012-10-06 NOTE — Assessment & Plan Note (Signed)
Exacerbation of seasonal rhinitis. Discussed antihistamines and available nasal steroids.

## 2012-10-06 NOTE — Assessment & Plan Note (Addendum)
Chronic insomnia component. She has tried a variety of biofeedback/lifestyle adjustments, medications. She would like to retry clonazepam. We discussed this carefully.

## 2012-12-03 ENCOUNTER — Encounter: Payer: Self-pay | Admitting: Internal Medicine

## 2012-12-03 ENCOUNTER — Ambulatory Visit (INDEPENDENT_AMBULATORY_CARE_PROVIDER_SITE_OTHER): Payer: Medicare Other | Admitting: Internal Medicine

## 2012-12-03 VITALS — BP 120/72 | HR 68 | Ht 62.0 in | Wt 176.4 lb

## 2012-12-03 DIAGNOSIS — G4700 Insomnia, unspecified: Secondary | ICD-10-CM

## 2012-12-03 DIAGNOSIS — J309 Allergic rhinitis, unspecified: Secondary | ICD-10-CM

## 2012-12-03 DIAGNOSIS — G2581 Restless legs syndrome: Secondary | ICD-10-CM

## 2012-12-03 DIAGNOSIS — J302 Other seasonal allergic rhinitis: Secondary | ICD-10-CM

## 2012-12-03 NOTE — Progress Notes (Signed)
Patient ID: VEENA STURGESS, female    DOB: December 18, 1945, 67 y.o.   MRN: [id number]  HPI - 67yoF followed here for allergic rhinitis/ conjunctivitis, and for insomnia with obstructive sleep apnea.  She has not tolerated CPAP well and has been educated on alternatives. Currently she has a trial set of Provent nasal valves and is still very tentative, but trying. Her tendency is toward "busy-brain" insomnia with difficulty initiating sleep. She is also interested in trying a Silent Night oral appliance offered by her dentist.  She has not been able to lose weight.  For seasonal allergic irritation of eyes, watering and itching, she asks sample Pataday.  10/11/10- OSA, insomnia, allergic rhinitis Provent didn't work out well and she still felt she couldn't sleep throught the night. She is going to look into an oral appliance with her dentist.  Weight loss and sleep hygiene goals reviewed. Spring associated nasal congestion and watery eyes are getting better.  12/13/10- 67yoF followed here for allergic rhinitis/ conjunctivitis, and for insomnia with obstructive sleep apnea, complicated by history of migraine, hypertension, GERD. She follows with Dr. Rex Kras for blood pressure management. He is evaluating recent description of aura with scheduled carotid Dopplers. Her primary sleep complaint remains difficulty initiating and maintaining sleep/insomnia. Obstructive sleep apnea has been identified and we have worked on it persistently. Unfortunately she has not tolerated CPAP or Provent nasal valves, and she has not been able to lose weight. Her dentist is interested in trying to make an oral appliance but apparently has limited experience with this. I have offered to send her to someone who has done a lot more of this kind of therapy. Restless legs have been identified in the past but does not seem to be a major clinical problem. Allergic rhinitis and conjunctivitis are managed symptomatically. Most recently she  had Pataday.  02/21/11- 67yoF followed here for allergic rhinitis/ conjunctivitis, and for insomnia with obstructive sleep apnea, complicated by history of migraine, hypertension, GERD. Since last here in August she has had no more paresthesias. She has given up on CPAP and Provent. nasal valves. Insomnia remains her primary sleep complaint. We have discussed this repeatedly and tried a number of approaches. Basic sleep hygiene is good. Fortunately workup for abnormal breast exam turned out benign. In the last 2-3 weeks she feels fall seasonal allergies bothering her. Eyes itch, water. Ears itch. She has a sore inside her right nostril from blowing her nose a lot. Clear mucus discharge. Denies chest tightness or wheeze.  05/02/11- 67yoF followed here for allergic rhinitis/ conjunctivitis, and for insomnia with obstructive sleep apnea, complicated by history of migraine, hypertension, GERD. Using clonazepam when needed for her insomnia. Failed to tolerate CPAP with multiple efforts, and the Provent nasal valves. Unable to lose weight. Lives alone with no reports of snoring or apnea. Did not get a dental oral device. Eyes have watered some, with little itch, sneeze or nasal congestion. Denies wheeze or cough.   07/04/11- 67yoF followed here for allergic rhinitis/ conjunctivitis, and for insomnia with obstructive sleep apnea/ failed CPAP, complicated by history of migraine, hypertension, GERD. Insomnia remains her primary sleep complaint. She is alone with no reports of snoring, witnessed apnea, or sleep disturbance by limb movements. We have discussed available treatments again. Seasonal rhinitis complaints are not yet the problem. We have reviewed symptomatic therapy is available if needed later in the spring. She is anticipating surgery for breast lump but does not know details yet. We do not anticipate  any respiratory concerns that would affect that surgery.  09/05/11- 67yoF followed here for  allergic rhinitis/ conjunctivitis, and for insomnia with obstructive sleep apnea/ failed CPAP, complicated by history of migraine, hypertension, GERD. Breast lump was a benign lipoma but she is not sure was adequately excised. Intermittent antihistamine. Never really sleeps well or without interruption. Pattern has not changed.  11/07/11- 67yoF followed here for allergic rhinitis/ conjunctivitis, and for insomnia with obstructive sleep apnea/ failed CPAP, complicated by history of migraine, hypertension, GERD. Pt still not using cpap and would like something new. We talked again about available treatments including oral appliances, nasal valves. We feel it is time for reassessment and she should qualify for a home unattended sleep study. Her biggest sleep complaint has always been difficulty initiating and maintaining sleep, mostly with difficulty relaxing. I suggested formal referral for a trial at [facility]. She is playing tennis and walking regularly with no acute problems.  01/11/12- 67 yoF never smoker followed here for allergic rhinitis/ conjunctivitis, and for insomnia with obstructive sleep apnea/ failed CPAP, complicated by history of migraine, hypertension, GERD. Her primary concern continues to be insomnia with difficulty initiating and maintaining sleep. She tried a wide Friday of interventions for sleep apnea but none improved her quality of life or ability to sleep well at night. 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 This creates additional stress, as do significant health problems in her family. We have discussed the impact on her sleep quality. She expresses intent to follow through with the cognitive behavioral therapy referral we had made. Allergy symptoms are generally controlled with OTC antihistamines now spring was the worst time of year.  03/12/12-  67 yoF never smoker followed here for allergic rhinitis/  conjunctivitis, and for insomnia with obstructive sleep apnea/ failed CPAP, complicated by history of migraine, hypertension, GERD. Still not sleeping well and would like to discuss her sleep med. Primary complaint is difficulty initiating and maintaining sleep. We have repeatedly reviewed sleep hygiene. Several medications tried as noted in record. She describes 2 hour sleep latency with Xanax 0.5 mg. Sleep soundly after she finally falls asleep. Lives alone. Stressful, rather political job with [employer].  Review of Systems-see HPI Constitutional:   No-   weight loss, night sweats, fevers, chills, fatigue, lassitude. + insomnia HEENT:   No-  headaches, difficulty swallowing, tooth/dental problems, sore throat,       No sneezing, itching, ears itch, nasal congestion, post nasal drip,  CV:  No-   chest pain, orthopnea, PND, swelling in lower extremities, anasarca, dizziness, palpitations Resp: No-   shortness of breath with exertion or at rest.              No-   productive cough,  No non-productive cough,  No-  coughing up of blood.              No-   change in color of mucus.  No- wheezing.   Skin: No-   rash or lesions. GI:  No-   heartburn, indigestion, abdominal pain, nausea, vomiting,  GU: MS:  No-   joint pain or swelling.   Neuro- :  Psych:  No- change in mood or affect. Some- depression or anxiety.  No memory loss.   Physical Exam  General- Alert, Oriented, Affect-appropriate, very pleasant, Distress- none acute, overweight Skin- rash-none, lesions- none, excoriation- none Lymphadenopathy- none Head- atraumatic  Eyes- Gross vision intact, PERRLA, conjunctivae clear secretions            Ears- Hearing, canals normal            Nose- , no -Septal dev, mucus, polyps, erosion, perforation. No turbinate edema or mucus bridging.            Throat- Mallampati III , mucosa clear , drainage- none, tonsils- atrophic Neck- flexible , trachea midline, no stridor ,  thyroid nl, carotid no bruit Chest - symmetrical excursion , unlabored           Heart/CV- RRR , no murmur , no gallop  , no rub, nl s1 s2                           - JVD- none , edema- none, stasis changes- none, varices- none           Lung- clear to P&A, wheeze- none, cough- none , dullness-none, rub- none           Chest wall-  Abd- Br/ Gen/ Rectal- Not done, not indicated Extrem- cyanosis- none, clubbing, none, atrophy- none, strength- nl Neuro- grossly intact to observation              Patient ID: Vita Barley, female    DOB: Apr 18, 1946, 67 y.o.   MRN: [id number]  HPI - 72yoF followed here for allergic rhinitis/ conjunctivitis, and for insomnia with obstructive sleep apnea.  She has not tolerated CPAP well and has been educated on alternatives. Currently she has a trial set of Provent nasal valves and is still very tentative, but trying. Her tendency is toward "busy-brain" insomnia with difficulty initiating sleep. She is also interested in trying a Silent Night oral appliance offered by her dentist.  She has not been able to lose weight.  For seasonal allergic irritation of eyes, watering and itching, she asks sample Pataday.  10/11/10- OSA, insomnia, allergic rhinitis Provent didn't work out well and she still felt she couldn't sleep throught the night. She is going to look into an oral appliance with her dentist.  Weight loss and sleep hygiene goals reviewed. Spring associated nasal congestion and watery eyes are getting better.  12/13/10- 67yoF followed here for allergic rhinitis/ conjunctivitis, and for insomnia with obstructive sleep apnea, complicated by history of migraine, hypertension, GERD. She follows with Dr. Rex Kras for blood pressure management. He is evaluating recent description of aura with scheduled carotid Dopplers. Her primary sleep complaint remains difficulty initiating and maintaining sleep/insomnia. Obstructive sleep apnea has been identified and we have  worked on it persistently. Unfortunately she has not tolerated CPAP or Provent nasal valves, and she has not been able to lose weight. Her dentist is interested in trying to make an oral appliance but apparently has limited experience with this. I have offered to send her to someone who has done a lot more of this kind of therapy. Restless legs have been identified in the past but does not seem to be a major clinical problem. Allergic rhinitis and conjunctivitis are managed symptomatically. Most recently she had Pataday.  02/21/11- 67yoF followed here for allergic rhinitis/ conjunctivitis, and for insomnia with obstructive sleep apnea, complicated by history of migraine, hypertension, GERD. Since last here in August she has had no more paresthesias. She has given up on CPAP and Provent. nasal valves. Insomnia remains her primary sleep complaint. We have discussed this repeatedly and tried a number of approaches.  Basic sleep hygiene is good. Fortunately workup for abnormal breast exam turned out benign. In the last 2-3 weeks she feels fall seasonal allergies bothering her. Eyes itch, water. Ears itch. She has a sore inside her right nostril from blowing her nose a lot. Clear mucus discharge. Denies chest tightness or wheeze.  05/02/11- 67yoF followed here for allergic rhinitis/ conjunctivitis, and for insomnia with obstructive sleep apnea, complicated by history of migraine, hypertension, GERD. Using clonazepam when needed for her insomnia. Failed to tolerate CPAP with multiple efforts, and the Provent nasal valves. Unable to lose weight. Lives alone with no reports of snoring or apnea. Did not get a dental oral device. Eyes have watered some, with little itch, sneeze or nasal congestion. Denies wheeze or cough.   07/04/11- 67yoF followed here for allergic rhinitis/ conjunctivitis, and for insomnia with obstructive sleep apnea/ failed CPAP, complicated by history of migraine, hypertension, GERD. Insomnia  remains her primary sleep complaint. She is alone with no reports of snoring, witnessed apnea, or sleep disturbance by limb movements. We have discussed available treatments again. Seasonal rhinitis complaints are not yet the problem. We have reviewed symptomatic therapy is available if needed later in the spring. She is anticipating surgery for breast lump but does not know details yet. We do not anticipate any respiratory concerns that would affect that surgery.  09/05/11- 67yoF followed here for allergic rhinitis/ conjunctivitis, and for insomnia with obstructive sleep apnea/ failed CPAP, complicated by history of migraine, hypertension, GERD. Breast lump was a benign lipoma but she is not sure was adequately excised. Intermittent antihistamine. Never really sleeps well or without interruption. Pattern has not changed.  11/07/11- 67yoF followed here for allergic rhinitis/ conjunctivitis, and for insomnia with obstructive sleep apnea/ failed CPAP, complicated by history of migraine, hypertension, GERD. Pt still not using cpap and would like something new. We talked again about available treatments including oral appliances, nasal valves. We feel it is time for reassessment and she should qualify for a home unattended sleep study. Her biggest sleep complaint has always been difficulty initiating and maintaining sleep, mostly with difficulty relaxing. I suggested formal referral for a trial at [facility]. She is playing tennis and walking regularly with no acute problems.  01/11/12- 67 yoF never smoker followed here for allergic rhinitis/ conjunctivitis, and for insomnia with obstructive sleep apnea/ failed CPAP, complicated by history of migraine, hypertension, GERD. Her primary concern continues to be insomnia with difficulty initiating and maintaining sleep. She tried a wide Friday of interventions for sleep apnea but none improved her quality of life or ability to sleep well at  night. 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 This creates additional stress, as do significant health problems in her family. We have discussed the impact on her sleep quality. She expresses intent to follow through with the cognitive behavioral therapy referral we had made. Allergy symptoms are generally controlled with OTC antihistamines now spring was the worst time of year.  03/12/12-  73 yoF never smoker followed here for allergic rhinitis/ conjunctivitis, and for insomnia with obstructive sleep apnea/ failed CPAP, complicated by history of migraine, hypertension, GERD. Still not sleeping well and would like to discuss her sleep med. Primary complaint is difficulty initiating and maintaining sleep. We have repeatedly reviewed sleep hygiene. Several medications tried as noted in record. She describes 2 hour sleep latency with Xanax 0.5 mg. Sleep soundly after she finally falls asleep. Lives alone. Stressful, rather political  job with [employer] of [address].  04/21/12- 65 yoF never smoker followed here for allergic rhinitis/ conjunctivitis, and for insomnia with obstructive sleep apnea/ failed CPAP, complicated by history of migraine, hypertension, GERD. ACUTE VISIT: started last week with GI upset, sore throat on Wenesday/Thursday, lost voice, cough-productive, pressure/pain in Right ear. Denies any fever but has had chills. Little loose phlegm, cough is mostly dry.. Taking supplemental vitamin D. Strep smear negative at her primary physician's office. Some chilling but no fever. GI now okay.  05/12/12  66 yoF never smoker followed here for allergic rhinitis/ conjunctivitis, and for insomnia with obstructive sleep apnea/ failed CPAP and Provent, complicated by history of migraine, hypertension, GERD. FOLLOWS FOR: occasional dry cough; denies any wheezing or SOB.  She started a Z-Pak yesterday we'll recent rhonchi does without fever. Did have flu  shot. She asks Diflucan available because of antibiotic/yeast. We discussed insomnia management again. She is alone and not aware of snoring. I mentioned a book I had found which might help.  09/25/12-66 yoF never smoker followed here for allergic rhinitis/ conjunctivitis, and for insomnia with obstructive sleep apnea/ failed CPAP and Provent, complicated by history of migraine, hypertension, GERD. FOLLOWS FOR: watery eyes, slight cough, feels stuffy from pollen Chronic insomnia pattern is not really changed. She has been under some stress recently related to her job for the [employer].  12/03/12- 66 yoF never smoker followed here for allergic rhinitis/ conjunctivitis, and for insomnia with obstructive sleep apnea/ failed CPAP and Provent, complicated by history of migraine, hypertension, GERD. FOLLOWS FOR: having stuffiness at times; other wise no cough, SOB, wheezing, or congestion. Chronic insomnia pattern is up and down but persistent. Clonazepam 0.5 mg has help when used occasionally. Sleep apnea status is uncertain but not obtrusive. She lives alone. Treatments all interfered with insomnia more than they helped. Occasional rhinitis, mostly treated OTC  Review of Systems-see HPI Constitutional:   No-   weight loss, night sweats, fevers, chills, fatigue, lassitude. + insomnia HEENT:   No-  headaches, difficulty swallowing, tooth/dental problems, +sore throat,       + sneezing, itching, +ears itch, +nasal congestion, post nasal drip,  CV:  No-   chest pain, orthopnea, PND, swelling in lower extremities, anasarca, dizziness, palpitations Resp: No-   shortness of breath with exertion or at rest.              +  productive cough,  + non-productive cough,  No-  coughing up of blood.              No-   change in color of mucus.  No- wheezing.   Skin: No-   rash or lesions. GI:  No-   heartburn, indigestion, abdominal pain, nausea, vomiting,  GU: MS:  No-   joint pain or swelling.    Neuro- :  Psych:  No- change in mood or affect. Some- depression or anxiety.  No memory loss.   Physical Exam  General- Alert, Oriented, Affect-appropriate, very pleasant, Distress- none acute, overweight Skin- rash-none, lesions- none, excoriation- none Lymphadenopathy- none Head- atraumatic            Eyes- Gross vision intact, PERRLA, conjunctivae clear secretions            Ears- Hearing, canals normal            Nose- +mild turbinate edema,  no -Septal dev, mucus, polyps, erosion, perforation.  Throat- Mallampati III , mucosa clear-not read , drainage- none, tonsils- atrophic   Neck- flexible , trachea midline, no stridor , thyroid nl, carotid no bruit Chest - symmetrical excursion , unlabored           Heart/CV- RRR , no murmur , no gallop  , no rub, nl s1 s2                           - JVD- none , edema- none, stasis changes- none, varices- none           Lung- clear to P&A, wheeze- none, no-cough  , dullness-none, rub- none           Chest wall-  Abd- Br/ Gen/ Rectal- Not done, not indicated Extrem- cyanosis- none, clubbing, none, atrophy- none, strength- nl Neuro- grossly intact to observation

## 2012-12-03 NOTE — Patient Instructions (Addendum)
Try Sustane (sp?) eye drops for dryness  Please call as needed

## 2012-12-18 ENCOUNTER — Encounter: Payer: Self-pay | Admitting: Internal Medicine

## 2012-12-18 NOTE — Assessment & Plan Note (Signed)
Air quality is a bigger problem than pollen levels now. We discussed OTC decongestants

## 2012-12-18 NOTE — Assessment & Plan Note (Signed)
She is not paying much attention, which suggests that leg jerks are not disruptive now

## 2012-12-18 NOTE — Assessment & Plan Note (Signed)
Repeated efforts sleep hygiene, behavioral therapy, medications. She lives alone,with nobody to comment about lifestyle stress.

## 2013-02-04 ENCOUNTER — Ambulatory Visit: Payer: Medicare Other | Admitting: Internal Medicine

## 2013-02-06 ENCOUNTER — Encounter: Payer: Self-pay | Admitting: Gynecology

## 2013-02-06 ENCOUNTER — Encounter: Payer: Self-pay | Admitting: Internal Medicine

## 2013-02-06 ENCOUNTER — Ambulatory Visit (INDEPENDENT_AMBULATORY_CARE_PROVIDER_SITE_OTHER): Payer: Medicare Other | Admitting: Internal Medicine

## 2013-02-06 VITALS — BP 116/74 | HR 63 | Ht 62.0 in | Wt 178.6 lb

## 2013-02-06 DIAGNOSIS — J302 Other seasonal allergic rhinitis: Secondary | ICD-10-CM

## 2013-02-06 DIAGNOSIS — G2581 Restless legs syndrome: Secondary | ICD-10-CM

## 2013-02-06 DIAGNOSIS — G4700 Insomnia, unspecified: Secondary | ICD-10-CM

## 2013-02-06 DIAGNOSIS — J309 Allergic rhinitis, unspecified: Secondary | ICD-10-CM

## 2013-02-06 MED ORDER — ALPRAZOLAM 0.5 MG PO TABS: 0.5000 mg | ORAL_TABLET | Freq: Every evening | ORAL | Status: DC | PRN

## 2013-02-06 MED ORDER — AMITRIPTYLINE HCL 50 MG PO TABS: 50.0000 mg | ORAL_TABLET | Freq: Every day | ORAL | Status: DC

## 2013-02-06 NOTE — Patient Instructions (Addendum)
Flu vax  Script Xanax 1 at bedtime       Try these for sleep             Amitriptyline 1 at bedtime

## 2013-02-06 NOTE — Progress Notes (Signed)
Patient ID: Teresa Molina, female    DOB: December 18, 1945, 67 y.o.   MRN: [id number]  HPI - [name] followed here for allergic rhinitis/ conjunctivitis, and for insomnia with obstructive sleep apnea.  She has not tolerated CPAP well and has been educated on alternatives. Currently she has a trial set of Provent nasal valves and is still very tentative, but trying. Her tendency is toward "busy-brain" insomnia with difficulty initiating sleep. She is also interested in trying a Silent Night oral appliance offered by her dentist.  She has not been able to lose weight.  For seasonal allergic irritation of eyes, watering and itching, she asks sample Pataday.  10/11/10- OSA, insomnia, allergic rhinitis Provent didn't work out well and she still felt she couldn't sleep throught the night. She is going to look into an oral appliance with her dentist.  Weight loss and sleep hygiene goals reviewed. Spring associated nasal congestion and watery eyes are getting better.  12/13/10- [name] followed here for allergic rhinitis/ conjunctivitis, and for insomnia with obstructive sleep apnea, complicated by history of migraine, hypertension, GERD. She follows with Dr. Rex Kras for blood pressure management. He is evaluating recent description of aura with scheduled carotid Dopplers. Her primary sleep complaint remains difficulty initiating and maintaining sleep/insomnia. Obstructive sleep apnea has been identified and we have worked on it persistently. Unfortunately she has not tolerated CPAP or Provent nasal valves, and she has not been able to lose weight. Her dentist is interested in trying to make an oral appliance but apparently has limited experience with this. I have offered to send her to someone who has done a lot more of this kind of therapy. Restless legs have been identified in the past but does not seem to be a major clinical problem. Allergic rhinitis and conjunctivitis are managed symptomatically. Most recently she  had Pataday.  02/21/11- [name] followed here for allergic rhinitis/ conjunctivitis, and for insomnia with obstructive sleep apnea, complicated by history of migraine, hypertension, GERD. Since last here in August she has had no more paresthesias. She has given up on CPAP and Provent. nasal valves. Insomnia remains her primary sleep complaint. We have discussed this repeatedly and tried a number of approaches. Basic sleep hygiene is good. Fortunately workup for abnormal breast exam turned out benign. In the last 2-3 weeks she feels fall seasonal allergies bothering her. Eyes itch, water. Ears itch. She has a sore inside her right nostril from blowing her nose a lot. Clear mucus discharge. Denies chest tightness or wheeze.  05/02/11- [name] followed here for allergic rhinitis/ conjunctivitis, and for insomnia with obstructive sleep apnea, complicated by history of migraine, hypertension, GERD. Using clonazepam when needed for her insomnia. Failed to tolerate CPAP with multiple efforts, and the Provent nasal valves. Unable to lose weight. Lives alone with no reports of snoring or apnea. Did not get a dental oral device. Eyes have watered some, with little itch, sneeze or nasal congestion. Denies wheeze or cough.   07/04/11- [name] followed here for allergic rhinitis/ conjunctivitis, and for insomnia with obstructive sleep apnea/ failed CPAP, complicated by history of migraine, hypertension, GERD. Insomnia remains her primary sleep complaint. She is alone with no reports of snoring, witnessed apnea, or sleep disturbance by limb movements. We have discussed available treatments again. Seasonal rhinitis complaints are not yet the problem. We have reviewed symptomatic therapy is available if needed later in the spring. She is anticipating surgery for breast lump but does not know details yet. We do not anticipate  any respiratory concerns that would affect that surgery.  09/05/11- [name] followed here for  allergic rhinitis/ conjunctivitis, and for insomnia with obstructive sleep apnea/ failed CPAP, complicated by history of migraine, hypertension, GERD. Breast lump was a benign lipoma but she is not sure was adequately excised. Intermittent antihistamine. Never really sleeps well or without interruption. Pattern has not changed.  11/07/11- [name] followed here for allergic rhinitis/ conjunctivitis, and for insomnia with obstructive sleep apnea/ failed CPAP, complicated by history of migraine, hypertension, GERD. Pt still not using cpap and would like something new. We talked again about available treatments including oral appliances, nasal valves. We feel it is time for reassessment and she should qualify for a home unattended sleep study. Her biggest sleep complaint has always been difficulty initiating and maintaining sleep, mostly with difficulty relaxing. I suggested formal referral for a trial at [facility]. She is playing tennis and walking regularly with no acute problems.  01/11/12- 65 yoF never smoker followed here for allergic rhinitis/ conjunctivitis, and for insomnia with obstructive sleep apnea/ failed CPAP, complicated by history of migraine, hypertension, GERD. Her primary concern continues to be insomnia with difficulty initiating and maintaining sleep. She tried a wide Friday of interventions for sleep apnea but none improved her quality of life or ability to sleep well at night. 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 This creates additional stress, as do significant health problems in her family. We have discussed the impact on her sleep quality. She expresses intent to follow through with the cognitive behavioral therapy referral we had made. Allergy symptoms are generally controlled with OTC antihistamines now spring was the worst time of year.  03/12/12-  55 yoF never smoker followed here for allergic rhinitis/  conjunctivitis, and for insomnia with obstructive sleep apnea/ failed CPAP, complicated by history of migraine, hypertension, GERD. Still not sleeping well and would like to discuss her sleep med. Primary complaint is difficulty initiating and maintaining sleep. We have repeatedly reviewed sleep hygiene. Several medications tried as noted in record. She describes 2 hour sleep latency with Xanax 0.5 mg. Sleep soundly after she finally falls asleep. Lives alone. Stressful, rather political job with [employer].  Review of Systems-see HPI Constitutional:   No-   weight loss, night sweats, fevers, chills, fatigue, lassitude. + insomnia HEENT:   No-  headaches, difficulty swallowing, tooth/dental problems, sore throat,       No sneezing, itching, ears itch, nasal congestion, post nasal drip,  CV:  No-   chest pain, orthopnea, PND, swelling in lower extremities, anasarca, dizziness, palpitations Resp: No-   shortness of breath with exertion or at rest.              No-   productive cough,  No non-productive cough,  No-  coughing up of blood.              No-   change in color of mucus.  No- wheezing.   Skin: No-   rash or lesions. GI:  No-   heartburn, indigestion, abdominal pain, nausea, vomiting,  GU: MS:  No-   joint pain or swelling.   Neuro- :  Psych:  No- change in mood or affect. Some- depression or anxiety.  No memory loss.   Physical Exam  General- Alert, Oriented, Affect-appropriate, very pleasant, Distress- none acute, overweight Skin- rash-none, lesions- none, excoriation- none Lymphadenopathy- none Head- atraumatic  Eyes- Gross vision intact, PERRLA, conjunctivae clear secretions            Ears- Hearing, canals normal            Nose- , no -Septal dev, mucus, polyps, erosion, perforation. No turbinate edema or mucus bridging.            Throat- Mallampati III , mucosa clear , drainage- none, tonsils- atrophic Neck- flexible , trachea midline, no stridor ,  thyroid nl, carotid no bruit Chest - symmetrical excursion , unlabored           Heart/CV- RRR , no murmur , no gallop  , no rub, nl s1 s2                           - JVD- none , edema- none, stasis changes- none, varices- none           Lung- clear to P&A, wheeze- none, cough- none , dullness-none, rub- none           Chest wall-  Abd- Br/ Gen/ Rectal- Not done, not indicated Extrem- cyanosis- none, clubbing, none, atrophy- none, strength- nl Neuro- grossly intact to observation              Patient ID: Vita Barley, female    DOB: Apr 18, 1946, 67 y.o.   MRN: [id number]  HPI - 72yoF followed here for allergic rhinitis/ conjunctivitis, and for insomnia with obstructive sleep apnea.  She has not tolerated CPAP well and has been educated on alternatives. Currently she has a trial set of Provent nasal valves and is still very tentative, but trying. Her tendency is toward "busy-brain" insomnia with difficulty initiating sleep. She is also interested in trying a Silent Night oral appliance offered by her dentist.  She has not been able to lose weight.  For seasonal allergic irritation of eyes, watering and itching, she asks sample Pataday.  10/11/10- OSA, insomnia, allergic rhinitis Provent didn't work out well and she still felt she couldn't sleep throught the night. She is going to look into an oral appliance with her dentist.  Weight loss and sleep hygiene goals reviewed. Spring associated nasal congestion and watery eyes are getting better.  12/13/10- [name] followed here for allergic rhinitis/ conjunctivitis, and for insomnia with obstructive sleep apnea, complicated by history of migraine, hypertension, GERD. She follows with Dr. Rex Kras for blood pressure management. He is evaluating recent description of aura with scheduled carotid Dopplers. Her primary sleep complaint remains difficulty initiating and maintaining sleep/insomnia. Obstructive sleep apnea has been identified and we have  worked on it persistently. Unfortunately she has not tolerated CPAP or Provent nasal valves, and she has not been able to lose weight. Her dentist is interested in trying to make an oral appliance but apparently has limited experience with this. I have offered to send her to someone who has done a lot more of this kind of therapy. Restless legs have been identified in the past but does not seem to be a major clinical problem. Allergic rhinitis and conjunctivitis are managed symptomatically. Most recently she had Pataday.  02/21/11- [name] followed here for allergic rhinitis/ conjunctivitis, and for insomnia with obstructive sleep apnea, complicated by history of migraine, hypertension, GERD. Since last here in August she has had no more paresthesias. She has given up on CPAP and Provent. nasal valves. Insomnia remains her primary sleep complaint. We have discussed this repeatedly and tried a number of approaches.  Basic sleep hygiene is good. Fortunately workup for abnormal breast exam turned out benign. In the last 2-3 weeks she feels fall seasonal allergies bothering her. Eyes itch, water. Ears itch. She has a sore inside her right nostril from blowing her nose a lot. Clear mucus discharge. Denies chest tightness or wheeze.  05/02/11- [name] followed here for allergic rhinitis/ conjunctivitis, and for insomnia with obstructive sleep apnea, complicated by history of migraine, hypertension, GERD. Using clonazepam when needed for her insomnia. Failed to tolerate CPAP with multiple efforts, and the Provent nasal valves. Unable to lose weight. Lives alone with no reports of snoring or apnea. Did not get a dental oral device. Eyes have watered some, with little itch, sneeze or nasal congestion. Denies wheeze or cough.   07/04/11- [name] followed here for allergic rhinitis/ conjunctivitis, and for insomnia with obstructive sleep apnea/ failed CPAP, complicated by history of migraine, hypertension, GERD. Insomnia  remains her primary sleep complaint. She is alone with no reports of snoring, witnessed apnea, or sleep disturbance by limb movements. We have discussed available treatments again. Seasonal rhinitis complaints are not yet the problem. We have reviewed symptomatic therapy is available if needed later in the spring. She is anticipating surgery for breast lump but does not know details yet. We do not anticipate any respiratory concerns that would affect that surgery.  09/05/11- [name] followed here for allergic rhinitis/ conjunctivitis, and for insomnia with obstructive sleep apnea/ failed CPAP, complicated by history of migraine, hypertension, GERD. Breast lump was a benign lipoma but she is not sure was adequately excised. Intermittent antihistamine. Never really sleeps well or without interruption. Pattern has not changed.  11/07/11- [name] followed here for allergic rhinitis/ conjunctivitis, and for insomnia with obstructive sleep apnea/ failed CPAP, complicated by history of migraine, hypertension, GERD. Pt still not using cpap and would like something new. We talked again about available treatments including oral appliances, nasal valves. We feel it is time for reassessment and she should qualify for a home unattended sleep study. Her biggest sleep complaint has always been difficulty initiating and maintaining sleep, mostly with difficulty relaxing. I suggested formal referral for a trial at [facility]. She is playing tennis and walking regularly with no acute problems.  01/11/12- 65 yoF never smoker followed here for allergic rhinitis/ conjunctivitis, and for insomnia with obstructive sleep apnea/ failed CPAP, complicated by history of migraine, hypertension, GERD. Her primary concern continues to be insomnia with difficulty initiating and maintaining sleep. She tried a wide Friday of interventions for sleep apnea but none improved her quality of life or ability to sleep well at  night. 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 This creates additional stress, as do significant health problems in her family. We have discussed the impact on her sleep quality. She expresses intent to follow through with the cognitive behavioral therapy referral we had made. Allergy symptoms are generally controlled with OTC antihistamines now spring was the worst time of year.  03/12/12-  73 yoF never smoker followed here for allergic rhinitis/ conjunctivitis, and for insomnia with obstructive sleep apnea/ failed CPAP, complicated by history of migraine, hypertension, GERD. Still not sleeping well and would like to discuss her sleep med. Primary complaint is difficulty initiating and maintaining sleep. We have repeatedly reviewed sleep hygiene. Several medications tried as noted in record. She describes 2 hour sleep latency with Xanax 0.5 mg. Sleep soundly after she finally falls asleep. Lives alone. Stressful, rather political  job with [employer] of [address].  04/21/12- 65 yoF never smoker followed here for allergic rhinitis/ conjunctivitis, and for insomnia with obstructive sleep apnea/ failed CPAP, complicated by history of migraine, hypertension, GERD. ACUTE VISIT: started last week with GI upset, sore throat on Wenesday/Thursday, lost voice, cough-productive, pressure/pain in Right ear. Denies any fever but has had chills. Little loose phlegm, cough is mostly dry.. Taking supplemental vitamin D. Strep smear negative at her primary physician's office. Some chilling but no fever. GI now okay.  05/12/12  66 yoF never smoker followed here for allergic rhinitis/ conjunctivitis, and for insomnia with obstructive sleep apnea/ failed CPAP and Provent, complicated by history of migraine, hypertension, GERD. FOLLOWS FOR: occasional dry cough; denies any wheezing or SOB.  She started a Z-Pak yesterday we'll recent rhonchi does without fever. Did have flu  shot. She asks Diflucan available because of antibiotic/yeast. We discussed insomnia management again. She is alone and not aware of snoring. I mentioned a book I had found which might help.  09/25/12-66 yoF never smoker followed here for allergic rhinitis/ conjunctivitis, and for insomnia with obstructive sleep apnea/ failed CPAP and Provent, complicated by history of migraine, hypertension, GERD. FOLLOWS FOR: watery eyes, slight cough, feels stuffy from pollen Chronic insomnia pattern is not really changed. She has been under some stress recently related to her job for the [employer].  12/03/12- 66 yoF never smoker followed here for allergic rhinitis/ conjunctivitis, and for insomnia with obstructive sleep apnea/ failed CPAP and Provent, complicated by history of migraine, hypertension, GERD. FOLLOWS FOR: having stuffiness at times; other wise no cough, SOB, wheezing, or congestion. Chronic insomnia pattern is up and down but persistent. Clonazepam 0.5 mg has help when used occasionally. Sleep apnea status is uncertain but not obtrusive. She lives alone. Treatments all interfered with insomnia more than they helped. Occasional rhinitis, mostly treated OTC  02/06/13- 66 yoF never smoker followed here for allergic rhinitis/ conjunctivitis, and for insomnia with obstructive sleep apnea/ failed CPAP and Provent, complicated by history of migraine, hypertension, GERD. FOLLOWS FOR: denies any wheezing, SOB, cough, or congestion. Still having leg jerks and just not sleeping She lives alone and admits she"doesn't like night time and doesn't want to go to sleep". We discussed her experience with meds and she is willing to try Xanax plus Amitriptyline at bedtime.  Review of Systems-see HPI Constitutional:   No-   weight loss, night sweats, fevers, chills, fatigue, lassitude. + insomnia HEENT:   No-  headaches, difficulty swallowing, tooth/dental problems, no-sore throat,       No- sneezing,  itching, no-ears itch,no-nasal congestion, post nasal drip,  CV:  No-   chest pain, orthopnea, PND, swelling in lower extremities, anasarca, dizziness, palpitations Resp: No-   shortness of breath with exertion or at rest.             No- productive cough,  No- non-productive cough,  No-  coughing up of blood.              No-   change in color of mucus.  No- wheezing.   Skin: No-   rash or lesions. GI:  No-   heartburn, indigestion, abdominal pain, nausea, vomiting,  GU: MS:  No-   joint pain or swelling.   Neuro- :  Psych:  No- change in mood or affect. Some- depression or anxiety.  No memory loss.   Physical Exam  General- Alert, Oriented, Affect-appropriate, very pleasant, Distress- none acute, overweight Skin- rash-none,  lesions- none, excoriation- none Lymphadenopathy- none Head- atraumatic            Eyes- Gross vision intact, PERRLA, conjunctivae clear secretions            Ears- Hearing, canals normal            Nose- +mild turbinate edema,  no -Septal dev, mucus, polyps, erosion, perforation.             Throat- Mallampati III , mucosa clear-not read , drainage- none, tonsils- atrophic   Neck- flexible , trachea midline, no stridor , thyroid nl, carotid no bruit Chest - symmetrical excursion , unlabored           Heart/CV- RRR , no murmur , no gallop  , no rub, nl s1 s2                           - JVD- none , edema- none, stasis changes- none, varices- none           Lung- clear to P&A, wheeze- none, no-cough  , dullness-none, rub- none           Chest wall-  Abd- Br/ Gen/ Rectal- Not done, not indicated Extrem- cyanosis- none, clubbing, none, atrophy- none, strength- nl Neuro- grossly intact to observation

## 2013-02-16 NOTE — Assessment & Plan Note (Signed)
She lives alone and is not aware of significant physical sleep disturbance. She never tolerated any treatment tried for sleep apnea. She is frank now , admitting she really doesn't want to sleep at night. I think she would be much more comfortable if she had somebody there, but she is living alone. She is willing to try a combination of xanax for anxiety and amitriptyline for depression to see if sleep is any better. She continues to work for [employer], plays tennis with friends, goes to movies.

## 2013-02-16 NOTE — Assessment & Plan Note (Signed)
Good control so far into this fall season. Antihistamines would be first therapy tried.

## 2013-02-16 NOTE — Assessment & Plan Note (Signed)
She doesn't describe limb jerks is an active reason why she doesn't sleep

## 2013-02-24 ENCOUNTER — Encounter: Payer: Self-pay | Admitting: Gynecology

## 2013-02-24 ENCOUNTER — Ambulatory Visit (INDEPENDENT_AMBULATORY_CARE_PROVIDER_SITE_OTHER): Payer: Medicare Other | Admitting: Gynecology

## 2013-02-24 VITALS — BP 120/78 | Ht 63.0 in | Wt 177.0 lb

## 2013-02-24 DIAGNOSIS — N952 Postmenopausal atrophic vaginitis: Secondary | ICD-10-CM

## 2013-02-24 DIAGNOSIS — N951 Menopausal and female climacteric states: Secondary | ICD-10-CM

## 2013-02-24 NOTE — Progress Notes (Signed)
Teresa Molina June 22, 1945 [id number]        67 y.o.  G2P2002 for followup exam.  Several issues noted below.  Past medical history,surgical history, medications, allergies, family history and social history were all reviewed and documented in the EPIC chart.  ROS:  Performed and pertinent positives and negatives are included in the history, assessment and plan .  Exam: Kim assistant Filed Vitals:   02/24/13 1030  BP: 120/78  Height: 5\' 3"  (1.6 m)  Weight: 177 lb (80.287 kg)   General appearance  Normal Skin grossly normal Head/Neck normal with no cervical or supraclavicular adenopathy thyroid normal Lungs  clear Cardiac RR, without RMG Abdominal  soft, nontender, without masses, organomegaly or hernia Breasts  examined lying and sitting without masses, retractions, discharge or axillary adenopathy. Pelvic  Ext/BUS/vagina  normal with atrophic changes  Adnexa  Without masses or tenderness    Anus and perineum  normal   Rectovaginal  normal sphincter tone without palpated masses or tenderness.    Assessment/Plan:  67 y.o. [id number] female for followup exam.   1. Postmenopausal/atrophic genital changes. The patient has weaned herself off of her Estrace HRT and is doing well. Does have some night sweats but is tolerating them. Prefers to stay off of HRT. Will followup if she has any issues. 2. Mammography 01/2013. Had small cutaneous nodule excised last year which was benign. Continue with annual mammography. SBE monthly reviewed. 3. Pap smear 2012. No Pap smear done today. No history of significant abnormal Pap smears. Options to stop screening altogether as she is over the age of 82 and status post hysterectomy for benign indications reviewed versus with frequent screening interval. We'll readdress on an annual basis. 4. Colonoscopy yesterday. 5. DEXA 06/2011 normal. Plan repeat in 5 year interval. Increase calcium vitamin D reviewed. 6. Health maintenance. No blood work done as this is  all done through her primary physician's office. Followup one year, sooner as needed.  Note: This document was prepared with digital dictation and possible smart phrase technology. Any transcriptional errors that result from this process are unintentional.   Dara Lords MD, 10:55 AM 02/24/2013

## 2013-02-24 NOTE — Patient Instructions (Signed)
Follow up in one year for annual exam 

## 2013-02-25 LAB — URINALYSIS W MICROSCOPIC + REFLEX CULTURE
Bilirubin Urine: NEGATIVE
Glucose, UA: NEGATIVE mg/dL
Hgb urine dipstick: NEGATIVE
Leukocytes, UA: NEGATIVE
Protein, ur: NEGATIVE mg/dL
Urobilinogen, UA: 0.2 mg/dL (ref 0.0–1.0)

## 2013-02-26 ENCOUNTER — Other Ambulatory Visit: Payer: Self-pay | Admitting: Gynecology

## 2013-02-26 MED ORDER — SULFAMETHOXAZOLE-TMP DS 800-160 MG PO TABS: 1.0000 | ORAL_TABLET | Freq: Two times a day (BID) | ORAL | Status: DC

## 2013-02-27 LAB — URINE CULTURE: Colony Count: >=100000

## 2013-04-10 ENCOUNTER — Ambulatory Visit (INDEPENDENT_AMBULATORY_CARE_PROVIDER_SITE_OTHER): Payer: Medicare Other | Admitting: Internal Medicine

## 2013-04-10 ENCOUNTER — Encounter: Payer: Self-pay | Admitting: Internal Medicine

## 2013-04-10 VITALS — BP 118/72 | HR 82 | Ht 62.0 in | Wt 176.0 lb

## 2013-04-10 DIAGNOSIS — J302 Other seasonal allergic rhinitis: Secondary | ICD-10-CM

## 2013-04-10 DIAGNOSIS — J309 Allergic rhinitis, unspecified: Secondary | ICD-10-CM

## 2013-04-10 DIAGNOSIS — G4700 Insomnia, unspecified: Secondary | ICD-10-CM

## 2013-04-10 NOTE — Progress Notes (Signed)
Patient ID: Teresa Molina, female    DOB: December 18, 1945, 67 y.o.   MRN: [id number]  HPI - [name] followed here for allergic rhinitis/ conjunctivitis, and for insomnia with obstructive sleep apnea.  She has not tolerated CPAP well and has been educated on alternatives. Currently she has a trial set of Provent nasal valves and is still very tentative, but trying. Her tendency is toward "busy-brain" insomnia with difficulty initiating sleep. She is also interested in trying a Silent Night oral appliance offered by her dentist.  She has not been able to lose weight.  For seasonal allergic irritation of eyes, watering and itching, she asks sample Pataday.  10/11/10- OSA, insomnia, allergic rhinitis Provent didn't work out well and she still felt she couldn't sleep throught the night. She is going to look into an oral appliance with her dentist.  Weight loss and sleep hygiene goals reviewed. Spring associated nasal congestion and watery eyes are getting better.  12/13/10- [name] followed here for allergic rhinitis/ conjunctivitis, and for insomnia with obstructive sleep apnea, complicated by history of migraine, hypertension, GERD. She follows with Dr. Rex Kras for blood pressure management. He is evaluating recent description of aura with scheduled carotid Dopplers. Her primary sleep complaint remains difficulty initiating and maintaining sleep/insomnia. Obstructive sleep apnea has been identified and we have worked on it persistently. Unfortunately she has not tolerated CPAP or Provent nasal valves, and she has not been able to lose weight. Her dentist is interested in trying to make an oral appliance but apparently has limited experience with this. I have offered to send her to someone who has done a lot more of this kind of therapy. Restless legs have been identified in the past but does not seem to be a major clinical problem. Allergic rhinitis and conjunctivitis are managed symptomatically. Most recently she  had Pataday.  02/21/11- [name] followed here for allergic rhinitis/ conjunctivitis, and for insomnia with obstructive sleep apnea, complicated by history of migraine, hypertension, GERD. Since last here in August she has had no more paresthesias. She has given up on CPAP and Provent. nasal valves. Insomnia remains her primary sleep complaint. We have discussed this repeatedly and tried a number of approaches. Basic sleep hygiene is good. Fortunately workup for abnormal breast exam turned out benign. In the last 2-3 weeks she feels fall seasonal allergies bothering her. Eyes itch, water. Ears itch. She has a sore inside her right nostril from blowing her nose a lot. Clear mucus discharge. Denies chest tightness or wheeze.  05/02/11- [name] followed here for allergic rhinitis/ conjunctivitis, and for insomnia with obstructive sleep apnea, complicated by history of migraine, hypertension, GERD. Using clonazepam when needed for her insomnia. Failed to tolerate CPAP with multiple efforts, and the Provent nasal valves. Unable to lose weight. Lives alone with no reports of snoring or apnea. Did not get a dental oral device. Eyes have watered some, with little itch, sneeze or nasal congestion. Denies wheeze or cough.   07/04/11- [name] followed here for allergic rhinitis/ conjunctivitis, and for insomnia with obstructive sleep apnea/ failed CPAP, complicated by history of migraine, hypertension, GERD. Insomnia remains her primary sleep complaint. She is alone with no reports of snoring, witnessed apnea, or sleep disturbance by limb movements. We have discussed available treatments again. Seasonal rhinitis complaints are not yet the problem. We have reviewed symptomatic therapy is available if needed later in the spring. She is anticipating surgery for breast lump but does not know details yet. We do not anticipate  any respiratory concerns that would affect that surgery.  09/05/11- [name] followed here for  allergic rhinitis/ conjunctivitis, and for insomnia with obstructive sleep apnea/ failed CPAP, complicated by history of migraine, hypertension, GERD. Breast lump was a benign lipoma but she is not sure was adequately excised. Intermittent antihistamine. Never really sleeps well or without interruption. Pattern has not changed.  11/07/11- [name] followed here for allergic rhinitis/ conjunctivitis, and for insomnia with obstructive sleep apnea/ failed CPAP, complicated by history of migraine, hypertension, GERD. Pt still not using cpap and would like something new. We talked again about available treatments including oral appliances, nasal valves. We feel it is time for reassessment and she should qualify for a home unattended sleep study. Her biggest sleep complaint has always been difficulty initiating and maintaining sleep, mostly with difficulty relaxing. I suggested formal referral for a trial at [facility]. She is playing tennis and walking regularly with no acute problems.  01/11/12- 65 yoF never smoker followed here for allergic rhinitis/ conjunctivitis, and for insomnia with obstructive sleep apnea/ failed CPAP, complicated by history of migraine, hypertension, GERD. Her primary concern continues to be insomnia with difficulty initiating and maintaining sleep. She tried a wide Friday of interventions for sleep apnea but none improved her quality of life or ability to sleep well at night. 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 This creates additional stress, as do significant health problems in her family. We have discussed the impact on her sleep quality. She expresses intent to follow through with the cognitive behavioral therapy referral we had made. Allergy symptoms are generally controlled with OTC antihistamines now spring was the worst time of year.  03/12/12-  55 yoF never smoker followed here for allergic rhinitis/  conjunctivitis, and for insomnia with obstructive sleep apnea/ failed CPAP, complicated by history of migraine, hypertension, GERD. Still not sleeping well and would like to discuss her sleep med. Primary complaint is difficulty initiating and maintaining sleep. We have repeatedly reviewed sleep hygiene. Several medications tried as noted in record. She describes 2 hour sleep latency with Xanax 0.5 mg. Sleep soundly after she finally falls asleep. Lives alone. Stressful, rather political job with [employer].  Review of Systems-see HPI Constitutional:   No-   weight loss, night sweats, fevers, chills, fatigue, lassitude. + insomnia HEENT:   No-  headaches, difficulty swallowing, tooth/dental problems, sore throat,       No sneezing, itching, ears itch, nasal congestion, post nasal drip,  CV:  No-   chest pain, orthopnea, PND, swelling in lower extremities, anasarca, dizziness, palpitations Resp: No-   shortness of breath with exertion or at rest.              No-   productive cough,  No non-productive cough,  No-  coughing up of blood.              No-   change in color of mucus.  No- wheezing.   Skin: No-   rash or lesions. GI:  No-   heartburn, indigestion, abdominal pain, nausea, vomiting,  GU: MS:  No-   joint pain or swelling.   Neuro- :  Psych:  No- change in mood or affect. Some- depression or anxiety.  No memory loss.   Physical Exam  General- Alert, Oriented, Affect-appropriate, very pleasant, Distress- none acute, overweight Skin- rash-none, lesions- none, excoriation- none Lymphadenopathy- none Head- atraumatic  Eyes- Gross vision intact, PERRLA, conjunctivae clear secretions            Ears- Hearing, canals normal            Nose- , no -Septal dev, mucus, polyps, erosion, perforation. No turbinate edema or mucus bridging.            Throat- Mallampati III , mucosa clear , drainage- none, tonsils- atrophic Neck- flexible , trachea midline, no stridor ,  thyroid nl, carotid no bruit Chest - symmetrical excursion , unlabored           Heart/CV- RRR , no murmur , no gallop  , no rub, nl s1 s2                           - JVD- none , edema- none, stasis changes- none, varices- none           Lung- clear to P&A, wheeze- none, cough- none , dullness-none, rub- none           Chest wall-  Abd- Br/ Gen/ Rectal- Not done, not indicated Extrem- cyanosis- none, clubbing, none, atrophy- none, strength- nl Neuro- grossly intact to observation              Patient ID: Teresa Molina, female    DOB: Apr 18, 1946, 67 y.o.   MRN: [id number]  HPI - 72yoF followed here for allergic rhinitis/ conjunctivitis, and for insomnia with obstructive sleep apnea.  She has not tolerated CPAP well and has been educated on alternatives. Currently she has a trial set of Provent nasal valves and is still very tentative, but trying. Her tendency is toward "busy-brain" insomnia with difficulty initiating sleep. She is also interested in trying a Silent Night oral appliance offered by her dentist.  She has not been able to lose weight.  For seasonal allergic irritation of eyes, watering and itching, she asks sample Pataday.  10/11/10- OSA, insomnia, allergic rhinitis Provent didn't work out well and she still felt she couldn't sleep throught the night. She is going to look into an oral appliance with her dentist.  Weight loss and sleep hygiene goals reviewed. Spring associated nasal congestion and watery eyes are getting better.  12/13/10- [name] followed here for allergic rhinitis/ conjunctivitis, and for insomnia with obstructive sleep apnea, complicated by history of migraine, hypertension, GERD. She follows with Dr. Rex Kras for blood pressure management. He is evaluating recent description of aura with scheduled carotid Dopplers. Her primary sleep complaint remains difficulty initiating and maintaining sleep/insomnia. Obstructive sleep apnea has been identified and we have  worked on it persistently. Unfortunately she has not tolerated CPAP or Provent nasal valves, and she has not been able to lose weight. Her dentist is interested in trying to make an oral appliance but apparently has limited experience with this. I have offered to send her to someone who has done a lot more of this kind of therapy. Restless legs have been identified in the past but does not seem to be a major clinical problem. Allergic rhinitis and conjunctivitis are managed symptomatically. Most recently she had Pataday.  02/21/11- [name] followed here for allergic rhinitis/ conjunctivitis, and for insomnia with obstructive sleep apnea, complicated by history of migraine, hypertension, GERD. Since last here in August she has had no more paresthesias. She has given up on CPAP and Provent. nasal valves. Insomnia remains her primary sleep complaint. We have discussed this repeatedly and tried a number of approaches.  Basic sleep hygiene is good. Fortunately workup for abnormal breast exam turned out benign. In the last 2-3 weeks she feels fall seasonal allergies bothering her. Eyes itch, water. Ears itch. She has a sore inside her right nostril from blowing her nose a lot. Clear mucus discharge. Denies chest tightness or wheeze.  05/02/11- [name] followed here for allergic rhinitis/ conjunctivitis, and for insomnia with obstructive sleep apnea, complicated by history of migraine, hypertension, GERD. Using clonazepam when needed for her insomnia. Failed to tolerate CPAP with multiple efforts, and the Provent nasal valves. Unable to lose weight. Lives alone with no reports of snoring or apnea. Did not get a dental oral device. Eyes have watered some, with little itch, sneeze or nasal congestion. Denies wheeze or cough.   07/04/11- [name] followed here for allergic rhinitis/ conjunctivitis, and for insomnia with obstructive sleep apnea/ failed CPAP, complicated by history of migraine, hypertension, GERD. Insomnia  remains her primary sleep complaint. She is alone with no reports of snoring, witnessed apnea, or sleep disturbance by limb movements. We have discussed available treatments again. Seasonal rhinitis complaints are not yet the problem. We have reviewed symptomatic therapy is available if needed later in the spring. She is anticipating surgery for breast lump but does not know details yet. We do not anticipate any respiratory concerns that would affect that surgery.  09/05/11- [name] followed here for allergic rhinitis/ conjunctivitis, and for insomnia with obstructive sleep apnea/ failed CPAP, complicated by history of migraine, hypertension, GERD. Breast lump was a benign lipoma but she is not sure was adequately excised. Intermittent antihistamine. Never really sleeps well or without interruption. Pattern has not changed.  11/07/11- [name] followed here for allergic rhinitis/ conjunctivitis, and for insomnia with obstructive sleep apnea/ failed CPAP, complicated by history of migraine, hypertension, GERD. Pt still not using cpap and would like something new. We talked again about available treatments including oral appliances, nasal valves. We feel it is time for reassessment and she should qualify for a home unattended sleep study. Her biggest sleep complaint has always been difficulty initiating and maintaining sleep, mostly with difficulty relaxing. I suggested formal referral for a trial at [facility] Therapy. She is playing tennis and walking regularly with no acute problems.  01/11/12- 65 yoF never smoker followed here for allergic rhinitis/ conjunctivitis, and for insomnia with obstructive sleep apnea/ failed CPAP, complicated by history of migraine, hypertension, GERD. Her primary concern continues to be insomnia with difficulty initiating and maintaining sleep. She tried a wide Friday of interventions for sleep apnea but none improved her quality of life or ability to sleep well at  night. 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 This creates additional stress, as do significant health problems in her family. We have discussed the impact on her sleep quality. She expresses intent to follow through with the cognitive behavioral therapy referral we had made. Allergy symptoms are generally controlled with OTC antihistamines now spring was the worst time of year.  03/12/12-  73 yoF never smoker followed here for allergic rhinitis/ conjunctivitis, and for insomnia with obstructive sleep apnea/ failed CPAP, complicated by history of migraine, hypertension, GERD. Still not sleeping well and would like to discuss her sleep med. Primary complaint is difficulty initiating and maintaining sleep. We have repeatedly reviewed sleep hygiene. Several medications tried as noted in record. She describes 2 hour sleep latency with Xanax 0.5 mg. Sleep soundly after she finally falls asleep. Lives alone. Stressful, rather political  job with [employer] of [address].  04/21/12- 65 yoF never smoker followed here for allergic rhinitis/ conjunctivitis, and for insomnia with obstructive sleep apnea/ failed CPAP, complicated by history of migraine, hypertension, GERD. ACUTE VISIT: started last week with GI upset, sore throat on Wenesday/Thursday, lost voice, cough-productive, pressure/pain in Right ear. Denies any fever but has had chills. Little loose phlegm, cough is mostly dry.. Taking supplemental vitamin D. Strep smear negative at her primary physician's office. Some chilling but no fever. GI now okay.  05/12/12  66 yoF never smoker followed here for allergic rhinitis/ conjunctivitis, and for insomnia with obstructive sleep apnea/ failed CPAP and Provent, complicated by history of migraine, hypertension, GERD. FOLLOWS FOR: occasional dry cough; denies any wheezing or SOB.  She started a Z-Pak yesterday we'll recent rhonchi does without fever. Did have flu  shot. She asks Diflucan available because of antibiotic/yeast. We discussed insomnia management again. She is alone and not aware of snoring. I mentioned a book I had found which might help.  09/25/12-66 yoF never smoker followed here for allergic rhinitis/ conjunctivitis, and for insomnia with obstructive sleep apnea/ failed CPAP and Provent, complicated by history of migraine, hypertension, GERD. FOLLOWS FOR: watery eyes, slight cough, feels stuffy from pollen Chronic insomnia pattern is not really changed. She has been under some stress recently related to her job for the [employer].  12/03/12- 66 yoF never smoker followed here for allergic rhinitis/ conjunctivitis, and for insomnia with obstructive sleep apnea/ failed CPAP and Provent, complicated by history of migraine, hypertension, GERD. FOLLOWS FOR: having stuffiness at times; other wise no cough, SOB, wheezing, or congestion. Chronic insomnia pattern is up and down but persistent. Clonazepam 0.5 mg has help when used occasionally. Sleep apnea status is uncertain but not obtrusive. She lives alone. Treatments all interfered with insomnia more than they helped. Occasional rhinitis, mostly treated OTC  02/06/13- 66 yoF never smoker followed here for allergic rhinitis/ conjunctivitis, and for insomnia with obstructive sleep apnea/ failed CPAP and Provent, complicated by history of migraine, hypertension, GERD. FOLLOWS FOR: denies any wheezing, SOB, cough, or congestion. Still having leg jerks and just not sleeping She lives alone and admits she"doesn't like night time and doesn't want to go to sleep". We discussed her experience with meds and she is willing to try Xanax plus Amitriptyline at bedtime.  04/10/13- 66 yoF never smoker followed here for allergic rhinitis/ conjunctivitis, and for insomnia with obstructive sleep apnea/ failed CPAP and Provent, complicated by history of migraine, hypertension, GERD FOLLOWS FOR:  Breathing doing  well.  Still having trouble sleeping ,4 hours per night.  Not using CPAP. She did not follow through with the cognitive behavioral therapy or oral appliance suggestions. We discussed medications and her lifestyle. She lives alone. . Review of Systems-see HPI Constitutional:   No-   weight loss, night sweats, fevers, chills, fatigue, lassitude. + insomnia HEENT:   No-  headaches, difficulty swallowing, tooth/dental problems, no-sore throat,       No- sneezing, itching, no-ears itch,no-nasal congestion, post nasal drip,  CV:  No-   chest pain, orthopnea, PND, swelling in lower extremities, anasarca, dizziness, palpitations Resp: No-   shortness of breath with exertion or at rest.             No- productive cough,  No- non-productive cough,  No-  coughing up of blood.              No-   change in color of mucus.  No- wheezing.   Skin: No-   rash or lesions. GI:  No-   heartburn, indigestion, abdominal pain, nausea, vomiting,  GU: MS:  No-   joint pain or swelling.   Neuro- :  Psych:  No- change in mood or affect. Some- depression or anxiety.  No memory loss.   Physical Exam  General- Alert, Oriented, Affect-appropriate, very pleasant, Distress- none acute, overweight Skin- rash-none, lesions- none, excoriation- none Lymphadenopathy- none Head- atraumatic            Eyes- Gross vision intact, PERRLA, conjunctivae clear secretions            Ears- Hearing, canals normal            Nose- +mild turbinate edema,  no -Septal dev, mucus, polyps, erosion, perforation.             Throat- Mallampati III , mucosa clear-not read , drainage- none, tonsils- atrophic   Neck- flexible , trachea midline, no stridor , thyroid nl, carotid no bruit Chest - symmetrical excursion , unlabored           Heart/CV- RRR , no murmur , no gallop  , no rub, nl s1 s2                           - JVD- none , edema- none, stasis changes- none, varices- none           Lung- clear to P&A, wheeze- none, no-cough  ,  dullness-none, rub- none           Chest wall-  Abd- Br/ Gen/ Rectal- Not done, not indicated Extrem- cyanosis- none, clubbing, none, atrophy- none, strength- nl Neuro- grossly intact to observation

## 2013-04-10 NOTE — Patient Instructions (Signed)
We can refill the clonazepam as needed, since it seems to help the most.  We can always look again into trying an oral appliance for the sleep apnea, with Dr Myrtis Ser or a second opinion from someone else.

## 2013-04-29 NOTE — Assessment & Plan Note (Signed)
Not a significant problem at this visit, outside of pollen season.

## 2013-04-29 NOTE — Assessment & Plan Note (Signed)
Plan-We again discussed clonazepam in comparison with other meds she has tried. Sleep hygiene.

## 2013-04-29 NOTE — Assessment & Plan Note (Signed)
We again discussed oral appliance as an option and she may decide she would be willing to look into it, without commitment today.

## 2013-06-10 ENCOUNTER — Encounter: Payer: Self-pay | Admitting: Internal Medicine

## 2013-06-10 ENCOUNTER — Ambulatory Visit (INDEPENDENT_AMBULATORY_CARE_PROVIDER_SITE_OTHER): Payer: Medicare Other | Admitting: Internal Medicine

## 2013-06-10 VITALS — BP 122/70 | HR 69 | Ht 62.0 in | Wt 180.8 lb

## 2013-06-10 DIAGNOSIS — J309 Allergic rhinitis, unspecified: Secondary | ICD-10-CM

## 2013-06-10 DIAGNOSIS — J3089 Other allergic rhinitis: Principal | ICD-10-CM

## 2013-06-10 DIAGNOSIS — J302 Other seasonal allergic rhinitis: Secondary | ICD-10-CM

## 2013-06-10 DIAGNOSIS — G4700 Insomnia, unspecified: Secondary | ICD-10-CM

## 2013-06-10 MED ORDER — IPRATROPIUM BROMIDE 0.03 % NA SOLN: NASAL | Status: DC

## 2013-06-10 NOTE — Progress Notes (Signed)
Patient ID: Teresa Molina, female    DOB: December 18, 1945, 68 y.o.   MRN: [id number]  HPI - 68yoF followed here for allergic rhinitis/ conjunctivitis, and for insomnia with obstructive sleep apnea.  She has not tolerated CPAP well and has been educated on alternatives. Currently she has a trial set of Provent nasal valves and is still very tentative, but trying. Her tendency is toward "busy-brain" insomnia with difficulty initiating sleep. She is also interested in trying a Silent Night oral appliance offered by her dentist.  She has not been able to lose weight.  For seasonal allergic irritation of eyes, watering and itching, she asks sample Pataday.  10/11/10- OSA, insomnia, allergic rhinitis Provent didn't work out well and she still felt she couldn't sleep throught the night. She is going to look into an oral appliance with her dentist.  Weight loss and sleep hygiene goals reviewed. Spring associated nasal congestion and watery eyes are getting better.  12/13/10- 68yoF followed here for allergic rhinitis/ conjunctivitis, and for insomnia with obstructive sleep apnea, complicated by history of migraine, hypertension, GERD. She follows with Dr. Rex Kras for blood pressure management. He is evaluating recent description of aura with scheduled carotid Dopplers. Her primary sleep complaint remains difficulty initiating and maintaining sleep/insomnia. Obstructive sleep apnea has been identified and we have worked on it persistently. Unfortunately she has not tolerated CPAP or Provent nasal valves, and she has not been able to lose weight. Her dentist is interested in trying to make an oral appliance but apparently has limited experience with this. I have offered to send her to someone who has done a lot more of this kind of therapy. Restless legs have been identified in the past but does not seem to be a major clinical problem. Allergic rhinitis and conjunctivitis are managed symptomatically. Most recently she  had Pataday.  02/21/11- 68yoF followed here for allergic rhinitis/ conjunctivitis, and for insomnia with obstructive sleep apnea, complicated by history of migraine, hypertension, GERD. Since last here in August she has had no more paresthesias. She has given up on CPAP and Provent. nasal valves. Insomnia remains her primary sleep complaint. We have discussed this repeatedly and tried a number of approaches. Basic sleep hygiene is good. Fortunately workup for abnormal breast exam turned out benign. In the last 2-3 weeks she feels fall seasonal allergies bothering her. Eyes itch, water. Ears itch. She has a sore inside her right nostril from blowing her nose a lot. Clear mucus discharge. Denies chest tightness or wheeze.  05/02/11- 68yoF followed here for allergic rhinitis/ conjunctivitis, and for insomnia with obstructive sleep apnea, complicated by history of migraine, hypertension, GERD. Using clonazepam when needed for her insomnia. Failed to tolerate CPAP with multiple efforts, and the Provent nasal valves. Unable to lose weight. Lives alone with no reports of snoring or apnea. Did not get a dental oral device. Eyes have watered some, with little itch, sneeze or nasal congestion. Denies wheeze or cough.   07/04/11- 68yoF followed here for allergic rhinitis/ conjunctivitis, and for insomnia with obstructive sleep apnea/ failed CPAP, complicated by history of migraine, hypertension, GERD. Insomnia remains her primary sleep complaint. She is alone with no reports of snoring, witnessed apnea, or sleep disturbance by limb movements. We have discussed available treatments again. Seasonal rhinitis complaints are not yet the problem. We have reviewed symptomatic therapy is available if needed later in the spring. She is anticipating surgery for breast lump but does not know details yet. We do not anticipate  any respiratory concerns that would affect that surgery.  09/05/11- 68yoF followed here for  allergic rhinitis/ conjunctivitis, and for insomnia with obstructive sleep apnea/ failed CPAP, complicated by history of migraine, hypertension, GERD. Breast lump was a benign lipoma but she is not sure was adequately excised. Intermittent antihistamine. Never really sleeps well or without interruption. Pattern has not changed.  11/07/11- 68yoF followed here for allergic rhinitis/ conjunctivitis, and for insomnia with obstructive sleep apnea/ failed CPAP, complicated by history of migraine, hypertension, GERD. Pt still not using cpap and would like something new. We talked again about available treatments including oral appliances, nasal valves. We feel it is time for reassessment and she should qualify for a home unattended sleep study. Her biggest sleep complaint has always been difficulty initiating and maintaining sleep, mostly with difficulty relaxing. I suggested formal referral for a trial at [facility]. She is playing tennis and walking regularly with no acute problems.  01/11/12- 68 yoF never smoker followed here for allergic rhinitis/ conjunctivitis, and for insomnia with obstructive sleep apnea/ failed CPAP, complicated by history of migraine, hypertension, GERD. Her primary concern continues to be insomnia with difficulty initiating and maintaining sleep. She tried a wide Friday of interventions for sleep apnea but none improved her quality of life or ability to sleep well at night. 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 This creates additional stress, as do significant health problems in her family. We have discussed the impact on her sleep quality. She expresses intent to follow through with the cognitive behavioral therapy referral we had made. Allergy symptoms are generally controlled with OTC antihistamines now spring was the worst time of year.  03/12/12-  68 yoF never smoker followed here for allergic rhinitis/  conjunctivitis, and for insomnia with obstructive sleep apnea/ failed CPAP, complicated by history of migraine, hypertension, GERD. Still not sleeping well and would like to discuss her sleep med. Primary complaint is difficulty initiating and maintaining sleep. We have repeatedly reviewed sleep hygiene. Several medications tried as noted in record. She describes 2 hour sleep latency with Xanax 0.5 mg. Sleep soundly after she finally falls asleep. Lives alone. Stressful, rather political job with [employer].  Review of Systems-see HPI Constitutional:   No-   weight loss, night sweats, fevers, chills, fatigue, lassitude. + insomnia HEENT:   No-  headaches, difficulty swallowing, tooth/dental problems, sore throat,       No sneezing, itching, ears itch, nasal congestion, post nasal drip,  CV:  No-   chest pain, orthopnea, PND, swelling in lower extremities, anasarca, dizziness, palpitations Resp: No-   shortness of breath with exertion or at rest.              No-   productive cough,  No non-productive cough,  No-  coughing up of blood.              No-   change in color of mucus.  No- wheezing.   Skin: No-   rash or lesions. GI:  No-   heartburn, indigestion, abdominal pain, nausea, vomiting,  GU: MS:  No-   joint pain or swelling.   Neuro- :  Psych:  No- change in mood or affect. Some- depression or anxiety.  No memory loss.   Physical Exam  General- Alert, Oriented, Affect-appropriate, very pleasant, Distress- none acute, overweight Skin- rash-none, lesions- none, excoriation- none Lymphadenopathy- none Head- atraumatic  Eyes- Gross vision intact, PERRLA, conjunctivae clear secretions            Ears- Hearing, canals normal            Nose- , no -Septal dev, mucus, polyps, erosion, perforation. No turbinate edema or mucus bridging.            Throat- Mallampati III , mucosa clear , drainage- none, tonsils- atrophic Neck- flexible , trachea midline, no stridor ,  thyroid nl, carotid no bruit Chest - symmetrical excursion , unlabored           Heart/CV- RRR , no murmur , no gallop  , no rub, nl s1 s2                           - JVD- none , edema- none, stasis changes- none, varices- none           Lung- clear to P&A, wheeze- none, cough- none , dullness-none, rub- none           Chest wall-  Abd- Br/ Gen/ Rectal- Not done, not indicated Extrem- cyanosis- none, clubbing, none, atrophy- none, strength- nl Neuro- grossly intact to observation              Patient ID: Vita Barley, female    DOB: Apr 18, 1946, 68 y.o.   MRN: [id number]  HPI - 72yoF followed here for allergic rhinitis/ conjunctivitis, and for insomnia with obstructive sleep apnea.  She has not tolerated CPAP well and has been educated on alternatives. Currently she has a trial set of Provent nasal valves and is still very tentative, but trying. Her tendency is toward "busy-brain" insomnia with difficulty initiating sleep. She is also interested in trying a Silent Night oral appliance offered by her dentist.  She has not been able to lose weight.  For seasonal allergic irritation of eyes, watering and itching, she asks sample Pataday.  10/11/10- OSA, insomnia, allergic rhinitis Provent didn't work out well and she still felt she couldn't sleep throught the night. She is going to look into an oral appliance with her dentist.  Weight loss and sleep hygiene goals reviewed. Spring associated nasal congestion and watery eyes are getting better.  12/13/10- 68yoF followed here for allergic rhinitis/ conjunctivitis, and for insomnia with obstructive sleep apnea, complicated by history of migraine, hypertension, GERD. She follows with Dr. Rex Kras for blood pressure management. He is evaluating recent description of aura with scheduled carotid Dopplers. Her primary sleep complaint remains difficulty initiating and maintaining sleep/insomnia. Obstructive sleep apnea has been identified and we have  worked on it persistently. Unfortunately she has not tolerated CPAP or Provent nasal valves, and she has not been able to lose weight. Her dentist is interested in trying to make an oral appliance but apparently has limited experience with this. I have offered to send her to someone who has done a lot more of this kind of therapy. Restless legs have been identified in the past but does not seem to be a major clinical problem. Allergic rhinitis and conjunctivitis are managed symptomatically. Most recently she had Pataday.  02/21/11- 68yoF followed here for allergic rhinitis/ conjunctivitis, and for insomnia with obstructive sleep apnea, complicated by history of migraine, hypertension, GERD. Since last here in August she has had no more paresthesias. She has given up on CPAP and Provent. nasal valves. Insomnia remains her primary sleep complaint. We have discussed this repeatedly and tried a number of approaches.  Basic sleep hygiene is good. Fortunately workup for abnormal breast exam turned out benign. In the last 2-3 weeks she feels fall seasonal allergies bothering her. Eyes itch, water. Ears itch. She has a sore inside her right nostril from blowing her nose a lot. Clear mucus discharge. Denies chest tightness or wheeze.  05/02/11- 68yoF followed here for allergic rhinitis/ conjunctivitis, and for insomnia with obstructive sleep apnea, complicated by history of migraine, hypertension, GERD. Using clonazepam when needed for her insomnia. Failed to tolerate CPAP with multiple efforts, and the Provent nasal valves. Unable to lose weight. Lives alone with no reports of snoring or apnea. Did not get a dental oral device. Eyes have watered some, with little itch, sneeze or nasal congestion. Denies wheeze or cough.   07/04/11- 68yoF followed here for allergic rhinitis/ conjunctivitis, and for insomnia with obstructive sleep apnea/ failed CPAP, complicated by history of migraine, hypertension, GERD. Insomnia  remains her primary sleep complaint. She is alone with no reports of snoring, witnessed apnea, or sleep disturbance by limb movements. We have discussed available treatments again. Seasonal rhinitis complaints are not yet the problem. We have reviewed symptomatic therapy is available if needed later in the spring. She is anticipating surgery for breast lump but does not know details yet. We do not anticipate any respiratory concerns that would affect that surgery.  09/05/11- 68yoF followed here for allergic rhinitis/ conjunctivitis, and for insomnia with obstructive sleep apnea/ failed CPAP, complicated by history of migraine, hypertension, GERD. Breast lump was a benign lipoma but she is not sure was adequately excised. Intermittent antihistamine. Never really sleeps well or without interruption. Pattern has not changed.  11/07/11- 68yoF followed here for allergic rhinitis/ conjunctivitis, and for insomnia with obstructive sleep apnea/ failed CPAP, complicated by history of migraine, hypertension, GERD. Pt still not using cpap and would like something new. We talked again about available treatments including oral appliances, nasal valves. We feel it is time for reassessment and she should qualify for a home unattended sleep study. Her biggest sleep complaint has always been difficulty initiating and maintaining sleep, mostly with difficulty relaxing. I suggested formal referral for a trial at [facility]. She is playing tennis and walking regularly with no acute problems.  01/11/12- 68 yoF never smoker followed here for allergic rhinitis/ conjunctivitis, and for insomnia with obstructive sleep apnea/ failed CPAP, complicated by history of migraine, hypertension, GERD. Her primary concern continues to be insomnia with difficulty initiating and maintaining sleep. She tried a wide Friday of interventions for sleep apnea but none improved her quality of life or ability to sleep well at  night. 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 This creates additional stress, as do significant health problems in her family. We have discussed the impact on her sleep quality. She expresses intent to follow through with the cognitive behavioral therapy referral we had made. Allergy symptoms are generally controlled with OTC antihistamines now spring was the worst time of year.  03/12/12-  73 yoF never smoker followed here for allergic rhinitis/ conjunctivitis, and for insomnia with obstructive sleep apnea/ failed CPAP, complicated by history of migraine, hypertension, GERD. Still not sleeping well and would like to discuss her sleep med. Primary complaint is difficulty initiating and maintaining sleep. We have repeatedly reviewed sleep hygiene. Several medications tried as noted in record. She describes 2 hour sleep latency with Xanax 0.5 mg. Sleep soundly after she finally falls asleep. Lives alone. Stressful, rather political  job with [employer].  04/21/12- 10 yoF never smoker followed here for allergic rhinitis/ conjunctivitis, and for insomnia with obstructive sleep apnea/ failed CPAP, complicated by history of migraine, hypertension, GERD. ACUTE VISIT: started last week with GI upset, sore throat on Wenesday/Thursday, lost voice, cough-productive, pressure/pain in Right ear. Denies any fever but has had chills. Little loose phlegm, cough is mostly dry.. Taking supplemental vitamin D. Strep smear negative at her primary physician's office. Some chilling but no fever. GI now okay.  05/12/12  66 yoF never smoker followed here for allergic rhinitis/ conjunctivitis, and for insomnia with obstructive sleep apnea/ failed CPAP and Provent, complicated by history of migraine, hypertension, GERD. FOLLOWS FOR: occasional dry cough; denies any wheezing or SOB.  She started a Z-Pak yesterday we'll recent rhonchi does without fever. Did have flu  shot. She asks Diflucan available because of antibiotic/yeast. We discussed insomnia management again. She is alone and not aware of snoring. I mentioned a book I had found which might help.  09/25/12-66 yoF never smoker followed here for allergic rhinitis/ conjunctivitis, and for insomnia with obstructive sleep apnea/ failed CPAP and Provent, complicated by history of migraine, hypertension, GERD. FOLLOWS FOR: watery eyes, slight cough, feels stuffy from pollen Chronic insomnia pattern is not really changed. She has been under some stress recently related to her job for the [employer]  12/03/12- 66 yoF never smoker followed here for allergic rhinitis/ conjunctivitis, and for insomnia with obstructive sleep apnea/ failed CPAP and Provent, complicated by history of migraine, hypertension, GERD. FOLLOWS FOR: having stuffiness at times; other wise no cough, SOB, wheezing, or congestion. Chronic insomnia pattern is up and down but persistent. Clonazepam 0.5 mg has help when used occasionally. Sleep apnea status is uncertain but not obtrusive. She lives alone. Treatments all interfered with insomnia more than they helped. Occasional rhinitis, mostly treated OTC  02/06/13- 66 yoF never smoker followed here for allergic rhinitis/ conjunctivitis, and for insomnia with obstructive sleep apnea/ failed CPAP and Provent, complicated by history of migraine, hypertension, GERD. FOLLOWS FOR: denies any wheezing, SOB, cough, or congestion. Still having leg jerks and just not sleeping She lives alone and admits she"doesn't like night time and doesn't want to go to sleep". We discussed her experience with meds and she is willing to try Xanax plus Amitriptyline at bedtime.  04/10/13- 66 yoF never smoker followed here for allergic rhinitis/ conjunctivitis, and for insomnia with obstructive sleep apnea/ failed CPAP and Provent, complicated by history of migraine, hypertension, GERD FOLLOWS FOR:  Breathing doing  well.  Still having trouble sleeping ,4 hours per night.  Not using CPAP. She did not follow through with the cognitive behavioral therapy or oral appliance suggestions. We discussed medications and her lifestyle. She lives alone.  06/10/13- 66 yoF never smoker followed here for allergic rhinitis/ conjunctivitis, and for insomnia with obstructive sleep apnea/ failed CPAP and Provent, complicated by history of migraine, hypertension, GERD FOLLOWS FOR: sleeps for about 4.5 hours each night; still not using CPAP -can not stand it. She has tried CPAP and BiPAP, been educated on oral appliances and the importance of maintaining normal body weight. Her primary sleep complaint is insomnia and sleep apnea interventions have made that worse. She feels her sleep status is stable and she lives with it. Educated again on sleep hygiene. Bothered recently by watery rhinorrhea. Discussed antihistamines. Occasional soreness inside nostrils from wiping her nose, using Neosporin.  Review of Systems-see HPI Constitutional:   No-  weight loss, night sweats, fevers, chills, fatigue, lassitude. + insomnia HEENT:   No-  headaches, difficulty swallowing, tooth/dental problems, no-sore throat,       No- sneezing, itching, no-ears itch,no-nasal congestion, post nasal drip,  CV:  No-   chest pain, orthopnea, PND, swelling in lower extremities, anasarca, dizziness, palpitations Resp: No-   shortness of breath with exertion or at rest.             No- productive cough,  No- non-productive cough,  No-  coughing up of blood.              No-   change in color of mucus.  No- wheezing.   Skin: No-   rash or lesions. GI:  No-   heartburn, indigestion, abdominal pain, nausea, vomiting,  GU: MS:  No-   joint pain or swelling.   Neuro- :  Psych:  No- change in mood or affect. Some- depression or anxiety.  No memory loss.   Physical Exam  General- Alert, Oriented, Affect-appropriate, very pleasant, Distress- none acute,  overweight Skin- rash-none, lesions- none, excoriation- none Lymphadenopathy- none Head- atraumatic            Eyes- Gross vision intact, PERRLA, conjunctivae clear secretions            Ears- Hearing, canals normal            Nose- +mild turbinate edema, No- sores,  no -Septal dev, mucus, polyps, erosion, perforation.             Throat- Mallampati III , mucosa clear-not read , drainage- none, tonsils- atrophic   Neck- flexible , trachea midline, no stridor , thyroid nl, carotid no bruit Chest - symmetrical excursion , unlabored           Heart/CV- RRR , no murmur , no gallop  , no rub, nl s1 s2                           - JVD- none , edema- none, stasis changes- none, varices- none           Lung- clear to P&A, wheeze- none, no-cough  , dullness-none, rub- none           Chest wall-  Abd- Br/ Gen/ Rectal- Not done, not indicated Extrem- cyanosis- none, clubbing, none, atrophy- none, strength- nl Neuro- grossly intact to observation

## 2013-06-10 NOTE — Patient Instructions (Signed)
Script for ipratropium nasal spray for watery nose as needed  Try the saline nasal gel instead of Neosporin

## 2013-07-05 NOTE — Assessment & Plan Note (Signed)
Chronic and difficult for her, but stable

## 2013-07-05 NOTE — Assessment & Plan Note (Signed)
Watery rhinorrhea at this time of year may be vasomotor rather than allergic Plan -consider ipratropium 0.03% nasal spray -try saline nasal gel instead of Neosporin

## 2013-07-27 ENCOUNTER — Other Ambulatory Visit: Payer: Self-pay | Admitting: *Deleted

## 2013-07-27 MED ORDER — EZETIMIBE-SIMVASTATIN 10-20 MG PO TABS
1.0000 | ORAL_TABLET | Freq: Every day | ORAL | Status: DC
Start: 2013-07-27 — End: 2015-11-07

## 2013-07-27 MED ORDER — TRIAMTERENE-HCTZ 37.5-25 MG PO TABS: 1.0000 | ORAL_TABLET | Freq: Every day | ORAL | Status: DC

## 2013-07-27 MED ORDER — LABETALOL HCL 200 MG PO TABS: 200.0000 mg | ORAL_TABLET | Freq: Two times a day (BID) | ORAL | Status: AC

## 2013-07-27 NOTE — Telephone Encounter (Signed)
Rx was sent to pharmacy electronically. Patient needs appmt - last OV 07/2012

## 2013-08-12 ENCOUNTER — Encounter: Payer: Self-pay | Admitting: Internal Medicine

## 2013-08-12 ENCOUNTER — Ambulatory Visit (INDEPENDENT_AMBULATORY_CARE_PROVIDER_SITE_OTHER): Payer: Medicare Other | Admitting: Internal Medicine

## 2013-08-12 VITALS — BP 134/74 | HR 76 | Ht 62.0 in | Wt 181.0 lb

## 2013-08-12 DIAGNOSIS — I10 Essential (primary) hypertension: Secondary | ICD-10-CM

## 2013-08-12 DIAGNOSIS — G4730 Sleep apnea, unspecified: Principal | ICD-10-CM

## 2013-08-12 DIAGNOSIS — J302 Other seasonal allergic rhinitis: Secondary | ICD-10-CM

## 2013-08-12 DIAGNOSIS — J309 Allergic rhinitis, unspecified: Secondary | ICD-10-CM

## 2013-08-12 DIAGNOSIS — G4700 Insomnia, unspecified: Secondary | ICD-10-CM

## 2013-08-12 DIAGNOSIS — J3089 Other allergic rhinitis: Secondary | ICD-10-CM

## 2013-08-12 MED ORDER — PROTRIPTYLINE HCL 5 MG PO TABS: ORAL_TABLET | ORAL | Status: DC

## 2013-08-12 NOTE — Patient Instructions (Signed)
Script sent for Vivactil/ protriptyline to try for the sleep apnea.

## 2013-08-12 NOTE — Progress Notes (Signed)
Patient ID: Teresa Molina, female    DOB: December 18, 1945, 68 y.o.   MRN: [id number]  HPI - [name] followed here for allergic rhinitis/ conjunctivitis, and for insomnia with obstructive sleep apnea.  She has not tolerated CPAP well and has been educated on alternatives. Currently she has a trial set of Provent nasal valves and is still very tentative, but trying. Her tendency is toward "busy-brain" insomnia with difficulty initiating sleep. She is also interested in trying a Silent Night oral appliance offered by her dentist.  She has not been able to lose weight.  For seasonal allergic irritation of eyes, watering and itching, she asks sample Pataday.  10/11/10- OSA, insomnia, allergic rhinitis Provent didn't work out well and she still felt she couldn't sleep throught the night. She is going to look into an oral appliance with her dentist.  Weight loss and sleep hygiene goals reviewed. Spring associated nasal congestion and watery eyes are getting better.  12/13/10- [name] followed here for allergic rhinitis/ conjunctivitis, and for insomnia with obstructive sleep apnea, complicated by history of migraine, hypertension, GERD. She follows with Dr. Rex Kras for blood pressure management. He is evaluating recent description of aura with scheduled carotid Dopplers. Her primary sleep complaint remains difficulty initiating and maintaining sleep/insomnia. Obstructive sleep apnea has been identified and we have worked on it persistently. Unfortunately she has not tolerated CPAP or Provent nasal valves, and she has not been able to lose weight. Her dentist is interested in trying to make an oral appliance but apparently has limited experience with this. I have offered to send her to someone who has done a lot more of this kind of therapy. Restless legs have been identified in the past but does not seem to be a major clinical problem. Allergic rhinitis and conjunctivitis are managed symptomatically. Most recently she  had Pataday.  02/21/11- [name] followed here for allergic rhinitis/ conjunctivitis, and for insomnia with obstructive sleep apnea, complicated by history of migraine, hypertension, GERD. Since last here in August she has had no more paresthesias. She has given up on CPAP and Provent. nasal valves. Insomnia remains her primary sleep complaint. We have discussed this repeatedly and tried a number of approaches. Basic sleep hygiene is good. Fortunately workup for abnormal breast exam turned out benign. In the last 2-3 weeks she feels fall seasonal allergies bothering her. Eyes itch, water. Ears itch. She has a sore inside her right nostril from blowing her nose a lot. Clear mucus discharge. Denies chest tightness or wheeze.  05/02/11- [name] followed here for allergic rhinitis/ conjunctivitis, and for insomnia with obstructive sleep apnea, complicated by history of migraine, hypertension, GERD. Using clonazepam when needed for her insomnia. Failed to tolerate CPAP with multiple efforts, and the Provent nasal valves. Unable to lose weight. Lives alone with no reports of snoring or apnea. Did not get a dental oral device. Eyes have watered some, with little itch, sneeze or nasal congestion. Denies wheeze or cough.   07/04/11- [name] followed here for allergic rhinitis/ conjunctivitis, and for insomnia with obstructive sleep apnea/ failed CPAP, complicated by history of migraine, hypertension, GERD. Insomnia remains her primary sleep complaint. She is alone with no reports of snoring, witnessed apnea, or sleep disturbance by limb movements. We have discussed available treatments again. Seasonal rhinitis complaints are not yet the problem. We have reviewed symptomatic therapy is available if needed later in the spring. She is anticipating surgery for breast lump but does not know details yet. We do not anticipate  any respiratory concerns that would affect that surgery.  09/05/11- [name] followed here for  allergic rhinitis/ conjunctivitis, and for insomnia with obstructive sleep apnea/ failed CPAP, complicated by history of migraine, hypertension, GERD. Breast lump was a benign lipoma but she is not sure was adequately excised. Intermittent antihistamine. Never really sleeps well or without interruption. Pattern has not changed.  11/07/11- [name] followed here for allergic rhinitis/ conjunctivitis, and for insomnia with obstructive sleep apnea/ failed CPAP, complicated by history of migraine, hypertension, GERD. Pt still not using cpap and would like something new. We talked again about available treatments including oral appliances, nasal valves. We feel it is time for reassessment and she should qualify for a home unattended sleep study. Her biggest sleep complaint has always been difficulty initiating and maintaining sleep, mostly with difficulty relaxing. I suggested formal referral for a trial at [facility]. She is playing tennis and walking regularly with no acute problems.  01/11/12- 65 yoF never smoker followed here for allergic rhinitis/ conjunctivitis, and for insomnia with obstructive sleep apnea/ failed CPAP, complicated by history of migraine, hypertension, GERD. Her primary concern continues to be insomnia with difficulty initiating and maintaining sleep. She tried a wide Friday of interventions for sleep apnea but none improved her quality of life or ability to sleep well at night. 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 This creates additional stress, as do significant health problems in her family. We have discussed the impact on her sleep quality. She expresses intent to follow through with the cognitive behavioral therapy referral we had made. Allergy symptoms are generally controlled with OTC antihistamines now spring was the worst time of year.  03/12/12-  30 yoF never smoker followed here for allergic rhinitis/  conjunctivitis, and for insomnia with obstructive sleep apnea/ failed CPAP, complicated by history of migraine, hypertension, GERD. Still not sleeping well and would like to discuss her sleep med. Primary complaint is difficulty initiating and maintaining sleep. We have repeatedly reviewed sleep hygiene. Several medications tried as noted in record. She describes 2 hour sleep latency with Xanax 0.5 mg. Sleep soundly after she finally falls asleep. Lives alone. Stressful, rather political job with [employer].  08/12/13- 67 yoF never smoker followed here for allergic rhinitis/ conjunctivitis, and for insomnia with obstructive sleep apnea/ failed CPAP, complicated by history of migraine, hypertension, GERD FOLLOWS FOR:  Still does not wear cpap.  C/o sinus congestion, sneezing.       Review of Systems-see HPI Constitutional:   No-   weight loss, night sweats, fevers, chills, fatigue, lassitude. + insomnia HEENT:   No-  headaches, difficulty swallowing, tooth/dental problems, sore throat,       No sneezing, itching, ears itch, nasal congestion, post nasal drip,  CV:  No-   chest pain, orthopnea, PND, swelling in lower extremities, anasarca, dizziness, palpitations Resp: No-   shortness of breath with exertion or at rest.              No-   productive cough,  No non-productive cough,  No-  coughing up of blood.              No-   change in color of mucus.  No- wheezing.   Skin: No-   rash or lesions. GI:  No-   heartburn, indigestion, abdominal pain, nausea, vomiting,  GU: MS:  No-   joint pain or swelling.   Neuro- :  Psych:  No-  change in mood or affect. Some- depression or anxiety.  No memory loss.   Physical Exam  General- Alert, Oriented, Affect-appropriate, very pleasant, Distress- none acute, overweight Skin- rash-none, lesions- none, excoriation- none Lymphadenopathy- none Head- atraumatic            Eyes- Gross vision intact, PERRLA, conjunctivae clear secretions             Ears- Hearing, canals normal            Nose- , no -Septal dev, mucus, polyps, erosion, perforation. No turbinate edema or mucus bridging.            Throat- Mallampati III , mucosa clear , drainage- none, tonsils- atrophic Neck- flexible , trachea midline, no stridor , thyroid nl, carotid no bruit Chest - symmetrical excursion , unlabored           Heart/CV- RRR , no murmur , no gallop  , no rub, nl s1 s2                           - JVD- none , edema- none, stasis changes- none, varices- none           Lung- clear to P&A, wheeze- none, cough- none , dullness-none, rub- none           Chest wall-  Abd- Br/ Gen/ Rectal- Not done, not indicated Extrem- cyanosis- none, clubbing, none, atrophy- none, strength- nl Neuro- grossly intact to observation              Patient ID: Teresa Molina, female    DOB: Jan 12, 1946, 68 y.o.   MRN: [id number]  HPI - 16yoF followed here for allergic rhinitis/ conjunctivitis, and for insomnia with obstructive sleep apnea.  She has not tolerated CPAP well and has been educated on alternatives. Currently she has a trial set of Provent nasal valves and is still very tentative, but trying. Her tendency is toward "busy-brain" insomnia with difficulty initiating sleep. She is also interested in trying a Silent Night oral appliance offered by her dentist.  She has not been able to lose weight.  For seasonal allergic irritation of eyes, watering and itching, she asks sample Pataday.  10/11/10- OSA, insomnia, allergic rhinitis Provent didn't work out well and she still felt she couldn't sleep throught the night. She is going to look into an oral appliance with her dentist.  Weight loss and sleep hygiene goals reviewed. Spring associated nasal congestion and watery eyes are getting better.  12/13/10- [name] followed here for allergic rhinitis/ conjunctivitis, and for insomnia with obstructive sleep apnea, complicated by history of migraine, hypertension, GERD. She  follows with Dr. Rex Kras for blood pressure management. He is evaluating recent description of aura with scheduled carotid Dopplers. Her primary sleep complaint remains difficulty initiating and maintaining sleep/insomnia. Obstructive sleep apnea has been identified and we have worked on it persistently. Unfortunately she has not tolerated CPAP or Provent nasal valves, and she has not been able to lose weight. Her dentist is interested in trying to make an oral appliance but apparently has limited experience with this. I have offered to send her to someone who has done a lot more of this kind of therapy. Restless legs have been identified in the past but does not seem to be a major clinical problem. Allergic rhinitis and conjunctivitis are managed symptomatically. Most recently she had Pataday.  02/21/11- [name] followed here for allergic rhinitis/ conjunctivitis, and for insomnia  with obstructive sleep apnea, complicated by history of migraine, hypertension, GERD. Since last here in August she has had no more paresthesias. She has given up on CPAP and Provent. nasal valves. Insomnia remains her primary sleep complaint. We have discussed this repeatedly and tried a number of approaches. Basic sleep hygiene is good. Fortunately workup for abnormal breast exam turned out benign. In the last 2-3 weeks she feels fall seasonal allergies bothering her. Eyes itch, water. Ears itch. She has a sore inside her right nostril from blowing her nose a lot. Clear mucus discharge. Denies chest tightness or wheeze.  05/02/11- [name] followed here for allergic rhinitis/ conjunctivitis, and for insomnia with obstructive sleep apnea, complicated by history of migraine, hypertension, GERD. Using clonazepam when needed for her insomnia. Failed to tolerate CPAP with multiple efforts, and the Provent nasal valves. Unable to lose weight. Lives alone with no reports of snoring or apnea. Did not get a dental oral device. Eyes have  watered some, with little itch, sneeze or nasal congestion. Denies wheeze or cough.   07/04/11- [name] followed here for allergic rhinitis/ conjunctivitis, and for insomnia with obstructive sleep apnea/ failed CPAP, complicated by history of migraine, hypertension, GERD. Insomnia remains her primary sleep complaint. She is alone with no reports of snoring, witnessed apnea, or sleep disturbance by limb movements. We have discussed available treatments again. Seasonal rhinitis complaints are not yet the problem. We have reviewed symptomatic therapy is available if needed later in the spring. She is anticipating surgery for breast lump but does not know details yet. We do not anticipate any respiratory concerns that would affect that surgery.  09/05/11- [name] followed here for allergic rhinitis/ conjunctivitis, and for insomnia with obstructive sleep apnea/ failed CPAP, complicated by history of migraine, hypertension, GERD. Breast lump was a benign lipoma but she is not sure was adequately excised. Intermittent antihistamine. Never really sleeps well or without interruption. Pattern has not changed.  11/07/11- [name] followed here for allergic rhinitis/ conjunctivitis, and for insomnia with obstructive sleep apnea/ failed CPAP, complicated by history of migraine, hypertension, GERD. Pt still not using cpap and would like something new. We talked again about available treatments including oral appliances, nasal valves. We feel it is time for reassessment and she should qualify for a home unattended sleep study. Her biggest sleep complaint has always been difficulty initiating and maintaining sleep, mostly with difficulty relaxing. I suggested formal referral for a trial at [facility]. She is playing tennis and walking regularly with no acute problems.  01/11/12- 65 yoF never smoker followed here for allergic rhinitis/ conjunctivitis, and for insomnia with obstructive sleep apnea/ failed  CPAP, complicated by history of migraine, hypertension, GERD. Her primary concern continues to be insomnia with difficulty initiating and maintaining sleep. She tried a wide Friday of interventions for sleep apnea but none improved her quality of life or ability to sleep well at night. 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 This creates additional stress, as do significant health problems in her family. We have discussed the impact on her sleep quality. She expresses intent to follow through with the cognitive behavioral therapy referral we had made. Allergy symptoms are generally controlled with OTC antihistamines now spring was the worst time of year.  03/12/12-  9 yoF never smoker followed here for allergic rhinitis/ conjunctivitis, and for insomnia with obstructive sleep apnea/ failed CPAP, complicated by history of migraine, hypertension, GERD. Still not sleeping well and would  like to discuss her sleep med. Primary complaint is difficulty initiating and maintaining sleep. We have repeatedly reviewed sleep hygiene. Several medications tried as noted in record. She describes 2 hour sleep latency with Xanax 0.5 mg. Sleep soundly after she finally falls asleep. Lives alone. Stressful, rather political job with [employer].  04/21/12- 51 yoF never smoker followed here for allergic rhinitis/ conjunctivitis, and for insomnia with obstructive sleep apnea/ failed CPAP, complicated by history of migraine, hypertension, GERD. ACUTE VISIT: started last week with GI upset, sore throat on Wenesday/Thursday, lost voice, cough-productive, pressure/pain in Right ear. Denies any fever but has had chills. Little loose phlegm, cough is mostly dry.. Taking supplemental vitamin D. Strep smear negative at her primary physician's office. Some chilling but no fever. GI now okay.  05/12/12  66 yoF never smoker followed here for allergic rhinitis/ conjunctivitis, and  for insomnia with obstructive sleep apnea/ failed CPAP and Provent, complicated by history of migraine, hypertension, GERD. FOLLOWS FOR: occasional dry cough; denies any wheezing or SOB.  She started a Z-Pak yesterday we'll recent rhonchi does without fever. Did have flu shot. She asks Diflucan available because of antibiotic/yeast. We discussed insomnia management again. She is alone and not aware of snoring. I mentioned a book I had found which might help.  09/25/12-66 yoF never smoker followed here for allergic rhinitis/ conjunctivitis, and for insomnia with obstructive sleep apnea/ failed CPAP and Provent, complicated by history of migraine, hypertension, GERD. FOLLOWS FOR: watery eyes, slight cough, feels stuffy from pollen Chronic insomnia pattern is not really changed. She has been under some stress recently related to her job for the [employer]  12/03/12- 66 yoF never smoker followed here for allergic rhinitis/ conjunctivitis, and for insomnia with obstructive sleep apnea/ failed CPAP and Provent, complicated by history of migraine, hypertension, GERD. FOLLOWS FOR: having stuffiness at times; other wise no cough, SOB, wheezing, or congestion. Chronic insomnia pattern is up and down but persistent. Clonazepam 0.5 mg has help when used occasionally. Sleep apnea status is uncertain but not obtrusive. She lives alone. Treatments all interfered with insomnia more than they helped. Occasional rhinitis, mostly treated OTC  02/06/13- 66 yoF never smoker followed here for allergic rhinitis/ conjunctivitis, and for insomnia with obstructive sleep apnea/ failed CPAP and Provent, complicated by history of migraine, hypertension, GERD. FOLLOWS FOR: denies any wheezing, SOB, cough, or congestion. Still having leg jerks and just not sleeping She lives alone and admits she"doesn't like night time and doesn't want to go to sleep". We discussed her experience with meds and she is willing to try Xanax  plus Amitriptyline at bedtime.  04/10/13- 66 yoF never smoker followed here for allergic rhinitis/ conjunctivitis, and for insomnia with obstructive sleep apnea/ failed CPAP and Provent, complicated by history of migraine, hypertension, GERD FOLLOWS FOR:  Breathing doing well.  Still having trouble sleeping ,4 hours per night.  Not using CPAP. She did not follow through with the cognitive behavioral therapy or oral appliance suggestions. We discussed medications and her lifestyle. She lives alone.  06/10/13- 66 yoF never smoker followed here for allergic rhinitis/ conjunctivitis, and for insomnia with obstructive sleep apnea/ failed CPAP and Provent, complicated by history of migraine, hypertension, GERD FOLLOWS FOR: sleeps for about 4.5 hours each night; still not using CPAP -can not stand it. She has tried CPAP and BiPAP, been educated on oral appliances and the importance of maintaining normal body weight. Her primary sleep complaint is insomnia and sleep apnea  interventions have made that worse. She feels her sleep status is stable and she lives with it. Educated again on sleep hygiene. Bothered recently by watery rhinorrhea. Discussed antihistamines. Occasional soreness inside nostrils from wiping her nose, using Neosporin.  Review of Systems-see HPI Constitutional:   No-   weight loss, night sweats, fevers, chills, fatigue, lassitude. + insomnia HEENT:   No-  headaches, difficulty swallowing, tooth/dental problems, no-sore throat,       No- sneezing, itching, no-ears itch,no-nasal congestion, post nasal drip,  CV:  No-   chest pain, orthopnea, PND, swelling in lower extremities, anasarca, dizziness, palpitations Resp: No-   shortness of breath with exertion or at rest.             No- productive cough,  No- non-productive cough,  No-  coughing up of blood.              No-   change in color of mucus.  No- wheezing.   Skin: No-   rash or lesions. GI:  No-   heartburn, indigestion, abdominal  pain, nausea, vomiting,  GU: MS:  No-   joint pain or swelling.   Neuro- :  Psych:  No- change in mood or affect. Some- depression or anxiety.  No memory loss.   Physical Exam  General- Alert, Oriented, Affect-appropriate, very pleasant, Distress- none acute, overweight Skin- rash-none, lesions- none, excoriation- none Lymphadenopathy- none Head- atraumatic            Eyes- Gross vision intact, PERRLA, conjunctivae clear secretions            Ears- Hearing, canals normal            Nose- +mild turbinate edema, No- sores,  no -Septal dev, mucus, polyps, erosion, perforation.             Throat- Mallampati III , mucosa clear-not read , drainage- none, tonsils- atrophic   Neck- flexible , trachea midline, no stridor , thyroid nl, carotid no bruit Chest - symmetrical excursion , unlabored           Heart/CV- RRR , no murmur , no gallop  , no rub, nl s1 s2                           - JVD- none , edema- none, stasis changes- none, varices- none           Lung- clear to P&A, wheeze- none, no-cough  , dullness-none, rub- none           Chest wall-  Abd- Br/ Gen/ Rectal- Not done, not indicated Extrem- cyanosis- none, clubbing, none, atrophy- none, strength- nl Neuro- grossly intact to observation              Patient ID: Teresa Molina, female    DOB: 1946/03/09, 68 y.o.   MRN: [id number]  HPI - 5yoF followed here for allergic rhinitis/ conjunctivitis, and for insomnia with obstructive sleep apnea.  She has not tolerated CPAP well and has been educated on alternatives. Currently she has a trial set of Provent nasal valves and is still very tentative, but trying. Her tendency is toward "busy-brain" insomnia with difficulty initiating sleep. She is also interested in trying a Silent Night oral appliance offered by her dentist.  She has not been able to lose weight.  For seasonal allergic irritation of eyes, watering and itching, she asks sample Pataday.  10/11/10- OSA, insomnia,  allergic rhinitis Provent  didn't work out well and she still felt she couldn't sleep throught the night. She is going to look into an oral appliance with her dentist.  Weight loss and sleep hygiene goals reviewed. Spring associated nasal congestion and watery eyes are getting better.  12/13/10- [name] followed here for allergic rhinitis/ conjunctivitis, and for insomnia with obstructive sleep apnea, complicated by history of migraine, hypertension, GERD. She follows with Dr. Rex Kras for blood pressure management. He is evaluating recent description of aura with scheduled carotid Dopplers. Her primary sleep complaint remains difficulty initiating and maintaining sleep/insomnia. Obstructive sleep apnea has been identified and we have worked on it persistently. Unfortunately she has not tolerated CPAP or Provent nasal valves, and she has not been able to lose weight. Her dentist is interested in trying to make an oral appliance but apparently has limited experience with this. I have offered to send her to someone who has done a lot more of this kind of therapy. Restless legs have been identified in the past but does not seem to be a major clinical problem. Allergic rhinitis and conjunctivitis are managed symptomatically. Most recently she had Pataday.  02/21/11- [name] followed here for allergic rhinitis/ conjunctivitis, and for insomnia with obstructive sleep apnea, complicated by history of migraine, hypertension, GERD. Since last here in August she has had no more paresthesias. She has given up on CPAP and Provent. nasal valves. Insomnia remains her primary sleep complaint. We have discussed this repeatedly and tried a number of approaches. Basic sleep hygiene is good. Fortunately workup for abnormal breast exam turned out benign. In the last 2-3 weeks she feels fall seasonal allergies bothering her. Eyes itch, water. Ears itch. She has a sore inside her right nostril from blowing her nose a lot. Clear  mucus discharge. Denies chest tightness or wheeze.  05/02/11- [name] followed here for allergic rhinitis/ conjunctivitis, and for insomnia with obstructive sleep apnea, complicated by history of migraine, hypertension, GERD. Using clonazepam when needed for her insomnia. Failed to tolerate CPAP with multiple efforts, and the Provent nasal valves. Unable to lose weight. Lives alone with no reports of snoring or apnea. Did not get a dental oral device. Eyes have watered some, with little itch, sneeze or nasal congestion. Denies wheeze or cough.   07/04/11- [name] followed here for allergic rhinitis/ conjunctivitis, and for insomnia with obstructive sleep apnea/ failed CPAP, complicated by history of migraine, hypertension, GERD. Insomnia remains her primary sleep complaint. She is alone with no reports of snoring, witnessed apnea, or sleep disturbance by limb movements. We have discussed available treatments again. Seasonal rhinitis complaints are not yet the problem. We have reviewed symptomatic therapy is available if needed later in the spring. She is anticipating surgery for breast lump but does not know details yet. We do not anticipate any respiratory concerns that would affect that surgery.  09/05/11- [name] followed here for allergic rhinitis/ conjunctivitis, and for insomnia with obstructive sleep apnea/ failed CPAP, complicated by history of migraine, hypertension, GERD. Breast lump was a benign lipoma but she is not sure was adequately excised. Intermittent antihistamine. Never really sleeps well or without interruption. Pattern has not changed.  11/07/11- [name] followed here for allergic rhinitis/ conjunctivitis, and for insomnia with obstructive sleep apnea/ failed CPAP, complicated by history of migraine, hypertension, GERD. Pt still not using cpap and would like something new. We talked again about available treatments including oral appliances, nasal valves. We feel it is time for  reassessment and she should qualify for a  home unattended sleep study. Her biggest sleep complaint has always been difficulty initiating and maintaining sleep, mostly with difficulty relaxing. I suggested formal referral for a trial at [facility]. She is playing tennis and walking regularly with no acute problems.  01/11/12- 65 yoF never smoker followed here for allergic rhinitis/ conjunctivitis, and for insomnia with obstructive sleep apnea/ failed CPAP, complicated by history of migraine, hypertension, GERD. Her primary concern continues to be insomnia with difficulty initiating and maintaining sleep. She tried a wide Friday of interventions for sleep apnea but none improved her quality of life or ability to sleep well at night. 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 This creates additional stress, as do significant health problems in her family. We have discussed the impact on her sleep quality. She expresses intent to follow through with the cognitive behavioral therapy referral we had made. Allergy symptoms are generally controlled with OTC antihistamines now spring was the worst time of year.  03/12/12-  74 yoF never smoker followed here for allergic rhinitis/ conjunctivitis, and for insomnia with obstructive sleep apnea/ failed CPAP, complicated by history of migraine, hypertension, GERD. Still not sleeping well and would like to discuss her sleep med. Primary complaint is difficulty initiating and maintaining sleep. We have repeatedly reviewed sleep hygiene. Several medications tried as noted in record. She describes 2 hour sleep latency with Xanax 0.5 mg. Sleep soundly after she finally falls asleep. Lives alone. Stressful, rather political job with [employer].  Review of Systems-see HPI Constitutional:   No-   weight loss, night sweats, fevers, chills, fatigue, lassitude. + insomnia HEENT:   No-  headaches, difficulty  swallowing, tooth/dental problems, sore throat,       No sneezing, itching, ears itch, nasal congestion, post nasal drip,  CV:  No-   chest pain, orthopnea, PND, swelling in lower extremities, anasarca, dizziness, palpitations Resp: No-   shortness of breath with exertion or at rest.              No-   productive cough,  No non-productive cough,  No-  coughing up of blood.              No-   change in color of mucus.  No- wheezing.   Skin: No-   rash or lesions. GI:  No-   heartburn, indigestion, abdominal pain, nausea, vomiting,  GU: MS:  No-   joint pain or swelling.   Neuro- :  Psych:  No- change in mood or affect. Some- depression or anxiety.  No memory loss.   Physical Exam  General- Alert, Oriented, Affect-appropriate, very pleasant, Distress- none acute, overweight Skin- rash-none, lesions- none, excoriation- none Lymphadenopathy- none Head- atraumatic            Eyes- Gross vision intact, PERRLA, conjunctivae clear secretions            Ears- Hearing, canals normal            Nose- , no -Septal dev, mucus, polyps, erosion, perforation. No turbinate edema or mucus bridging.            Throat- Mallampati III , mucosa clear , drainage- none, tonsils- atrophic Neck- flexible , trachea midline, no stridor , thyroid nl, carotid no bruit Chest - symmetrical excursion , unlabored           Heart/CV- RRR , no murmur , no gallop  , no rub, nl s1 s2                           -  JVD- none , edema- none, stasis changes- none, varices- none           Lung- clear to P&A, wheeze- none, cough- none , dullness-none, rub- none           Chest wall-  Abd- Br/ Gen/ Rectal- Not done, not indicated Extrem- cyanosis- none, clubbing, none, atrophy- none, strength- nl Neuro- grossly intact to observation              Patient ID: Teresa Molina, female    DOB: 1945/11/18, 68 y.o.   MRN: [id number]  HPI - 86yoF followed here for allergic rhinitis/ conjunctivitis, and for insomnia with  obstructive sleep apnea.  She has not tolerated CPAP well and has been educated on alternatives. Currently she has a trial set of Provent nasal valves and is still very tentative, but trying. Her tendency is toward "busy-brain" insomnia with difficulty initiating sleep. She is also interested in trying a Silent Night oral appliance offered by her dentist.  She has not been able to lose weight.  For seasonal allergic irritation of eyes, watering and itching, she asks sample Pataday.  10/11/10- OSA, insomnia, allergic rhinitis Provent didn't work out well and she still felt she couldn't sleep throught the night. She is going to look into an oral appliance with her dentist.  Weight loss and sleep hygiene goals reviewed. Spring associated nasal congestion and watery eyes are getting better.  12/13/10- [name] followed here for allergic rhinitis/ conjunctivitis, and for insomnia with obstructive sleep apnea, complicated by history of migraine, hypertension, GERD. She follows with Dr. Rex Kras for blood pressure management. He is evaluating recent description of aura with scheduled carotid Dopplers. Her primary sleep complaint remains difficulty initiating and maintaining sleep/insomnia. Obstructive sleep apnea has been identified and we have worked on it persistently. Unfortunately she has not tolerated CPAP or Provent nasal valves, and she has not been able to lose weight. Her dentist is interested in trying to make an oral appliance but apparently has limited experience with this. I have offered to send her to someone who has done a lot more of this kind of therapy. Restless legs have been identified in the past but does not seem to be a major clinical problem. Allergic rhinitis and conjunctivitis are managed symptomatically. Most recently she had Pataday.  02/21/11- [name] followed here for allergic rhinitis/ conjunctivitis, and for insomnia with obstructive sleep apnea, complicated by history of migraine,  hypertension, GERD. Since last here in August she has had no more paresthesias. She has given up on CPAP and Provent. nasal valves. Insomnia remains her primary sleep complaint. We have discussed this repeatedly and tried a number of approaches. Basic sleep hygiene is good. Fortunately workup for abnormal breast exam turned out benign. In the last 2-3 weeks she feels fall seasonal allergies bothering her. Eyes itch, water. Ears itch. She has a sore inside her right nostril from blowing her nose a lot. Clear mucus discharge. Denies chest tightness or wheeze.  05/02/11- [name] followed here for allergic rhinitis/ conjunctivitis, and for insomnia with obstructive sleep apnea, complicated by history of migraine, hypertension, GERD. Using clonazepam when needed for her insomnia. Failed to tolerate CPAP with multiple efforts, and the Provent nasal valves. Unable to lose weight. Lives alone with no reports of snoring or apnea. Did not get a dental oral device. Eyes have watered some, with little itch, sneeze or nasal congestion. Denies wheeze or cough.   07/04/11- [name] followed here for allergic rhinitis/ conjunctivitis, and for  insomnia with obstructive sleep apnea/ failed CPAP, complicated by history of migraine, hypertension, GERD. Insomnia remains her primary sleep complaint. She is alone with no reports of snoring, witnessed apnea, or sleep disturbance by limb movements. We have discussed available treatments again. Seasonal rhinitis complaints are not yet the problem. We have reviewed symptomatic therapy is available if needed later in the spring. She is anticipating surgery for breast lump but does not know details yet. We do not anticipate any respiratory concerns that would affect that surgery.  09/05/11- [name] followed here for allergic rhinitis/ conjunctivitis, and for insomnia with obstructive sleep apnea/ failed CPAP, complicated by history of migraine, hypertension, GERD. Breast lump was a  benign lipoma but she is not sure was adequately excised. Intermittent antihistamine. Never really sleeps well or without interruption. Pattern has not changed.  11/07/11- [name] followed here for allergic rhinitis/ conjunctivitis, and for insomnia with obstructive sleep apnea/ failed CPAP, complicated by history of migraine, hypertension, GERD. Pt still not using cpap and would like something new. We talked again about available treatments including oral appliances, nasal valves. We feel it is time for reassessment and she should qualify for a home unattended sleep study. Her biggest sleep complaint has always been difficulty initiating and maintaining sleep, mostly with difficulty relaxing. I suggested formal referral for a trial at [facility]. She is playing tennis and walking regularly with no acute problems.  01/11/12- 65 yoF never smoker followed here for allergic rhinitis/ conjunctivitis, and for insomnia with obstructive sleep apnea/ failed CPAP, complicated by history of migraine, hypertension, GERD. Her primary concern continues to be insomnia with difficulty initiating and maintaining sleep. She tried a wide Friday of interventions for sleep apnea but none improved her quality of life or ability to sleep well at night. 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 This creates additional stress, as do significant health problems in her family. We have discussed the impact on her sleep quality. She expresses intent to follow through with the cognitive behavioral therapy referral we had made. Allergy symptoms are generally controlled with OTC antihistamines now spring was the worst time of year.  03/12/12-  69 yoF never smoker followed here for allergic rhinitis/ conjunctivitis, and for insomnia with obstructive sleep apnea/ failed CPAP, complicated by history of migraine, hypertension, GERD. Still not sleeping well and would like to discuss  her sleep med. Primary complaint is difficulty initiating and maintaining sleep. We have repeatedly reviewed sleep hygiene. Several medications tried as noted in record. She describes 2 hour sleep latency with Xanax 0.5 mg. Sleep soundly after she finally falls asleep. Lives alone. Stressful, rather political job with [employer].  04/21/12- 51 yoF never smoker followed here for allergic rhinitis/ conjunctivitis, and for insomnia with obstructive sleep apnea/ failed CPAP, complicated by history of migraine, hypertension, GERD. ACUTE VISIT: started last week with GI upset, sore throat on Wenesday/Thursday, lost voice, cough-productive, pressure/pain in Right ear. Denies any fever but has had chills. Little loose phlegm, cough is mostly dry.. Taking supplemental vitamin D. Strep smear negative at her primary physician's office. Some chilling but no fever. GI now okay.  05/12/12  66 yoF never smoker followed here for allergic rhinitis/ conjunctivitis, and for insomnia with obstructive sleep apnea/ failed CPAP and Provent, complicated by history of migraine, hypertension, GERD. FOLLOWS FOR: occasional dry cough; denies any wheezing or SOB.  She started a Z-Pak yesterday we'll recent rhonchi does without fever. Did have  flu shot. She asks Diflucan available because of antibiotic/yeast. We discussed insomnia management again. She is alone and not aware of snoring. I mentioned a book I had found which might help.  09/25/12-66 yoF never smoker followed here for allergic rhinitis/ conjunctivitis, and for insomnia with obstructive sleep apnea/ failed CPAP and Provent, complicated by history of migraine, hypertension, GERD. FOLLOWS FOR: watery eyes, slight cough, feels stuffy from pollen Chronic insomnia pattern is not really changed. She has been under some stress recently related to her job for the [employer]  12/03/12- 66 yoF never smoker followed here for allergic rhinitis/  conjunctivitis, and for insomnia with obstructive sleep apnea/ failed CPAP and Provent, complicated by history of migraine, hypertension, GERD. FOLLOWS FOR: having stuffiness at times; other wise no cough, SOB, wheezing, or congestion. Chronic insomnia pattern is up and down but persistent. Clonazepam 0.5 mg has help when used occasionally. Sleep apnea status is uncertain but not obtrusive. She lives alone. Treatments all interfered with insomnia more than they helped. Occasional rhinitis, mostly treated OTC  02/06/13- 66 yoF never smoker followed here for allergic rhinitis/ conjunctivitis, and for insomnia with obstructive sleep apnea/ failed CPAP and Provent, complicated by history of migraine, hypertension, GERD. FOLLOWS FOR: denies any wheezing, SOB, cough, or congestion. Still having leg jerks and just not sleeping She lives alone and admits she"doesn't like night time and doesn't want to go to sleep". We discussed her experience with meds and she is willing to try Xanax plus Amitriptyline at bedtime.  04/10/13- 66 yoF never smoker followed here for allergic rhinitis/ conjunctivitis, and for insomnia with obstructive sleep apnea/ failed CPAP and Provent, complicated by history of migraine, hypertension, GERD FOLLOWS FOR:  Breathing doing well.  Still having trouble sleeping ,4 hours per night.  Not using CPAP. She did not follow through with the cognitive behavioral therapy or oral appliance suggestions. We discussed medications and her lifestyle. She lives alone.  06/10/13- 66 yoF never smoker followed here for allergic rhinitis/ conjunctivitis, and for insomnia with obstructive sleep apnea/ failed CPAP and Provent, complicated by history of migraine, hypertension, GERD FOLLOWS FOR: sleeps for about 4.5 hours each night; still not using CPAP -can not stand it. She has tried CPAP and BiPAP, been educated on oral appliances and the importance of maintaining normal body weight. Her primary sleep  complaint is insomnia and sleep apnea interventions have made that worse. She feels her sleep status is stable and she lives with it. Educated again on sleep hygiene. Bothered recently by watery rhinorrhea. Discussed antihistamines. Occasional soreness inside nostrils from wiping her nose, using Neosporin.  09/10/13- 67 yoF never smoker followed here for allergic rhinitis/ conjunctivitis, and for insomnia with obstructive sleep apnea/ failed CPAP and Provent, complicated by history of migraine, hypertension, GERD Minor seasonal stuffiness, no infection or chest discomfort. Antihistamines if needed. Sleep- bedtime 1:30-6:30 or 7:00AM. Admits tired. Discussed sleep hygiene.   Review of Systems-see HPI Constitutional:   No-   weight loss, night sweats, fevers, chills, fatigue, lassitude. + insomnia HEENT:   No-  headaches, difficulty swallowing, tooth/dental problems, no-sore throat,       No- sneezing, itching, no-ears itch,+nasal congestion, post nasal drip,  CV:  No-   chest pain, orthopnea, PND, swelling in lower extremities, anasarca, dizziness, palpitations Resp: No-   shortness of breath with exertion or at rest.             No- productive cough,  No- non-productive cough,  No-  coughing  up of blood.              No-   change in color of mucus.  No- wheezing.   Skin: No-   rash or lesions. GI:  No-   heartburn, indigestion, abdominal pain, nausea, vomiting,  GU: MS:  No-   joint pain or swelling.   Neuro- :  Psych:  No- change in mood or affect. Some- depression or anxiety.  No memory loss.   Physical Exam  General- Alert, Oriented, Affect-appropriate, very pleasant, Distress- none acute, overweight Skin- rash-none, lesions- none, excoriation- none Lymphadenopathy- none Head- atraumatic            Eyes- Gross vision intact, PERRLA, conjunctivae clear secretions            Ears- Hearing, canals normal            Nose- +mild turbinate edema, No- sores,  no -Septal dev, mucus, polyps,  erosion, perforation.             Throat- Mallampati III , mucosa clear-not read , drainage- none, tonsils- atrophic   Neck- flexible , trachea midline, no stridor , thyroid nl, carotid no bruit Chest - symmetrical excursion , unlabored           Heart/CV- RRR , no murmur , no gallop  , no rub, nl s1 s2                           - JVD- none , edema- none, stasis changes- none, varices- none           Lung- clear to P&A, wheeze- none, no-cough  , dullness-none, rub- none           Chest wall-  Abd- Br/ Gen/ Rectal- Not done, not indicated Extrem- cyanosis- none, clubbing, none, atrophy- none, strength- nl Neuro- grossly intact to observation

## 2013-09-10 NOTE — Assessment & Plan Note (Signed)
Managed by cardiology 

## 2013-09-10 NOTE — Assessment & Plan Note (Signed)
Mild rhinitis in grass pollen season. Discussed otc antihistamines vs nasal steroids.

## 2013-09-10 NOTE — Assessment & Plan Note (Signed)
She might be a candidate for trial of Belsomra

## 2013-10-01 ENCOUNTER — Other Ambulatory Visit: Payer: Self-pay | Admitting: Dermatology

## 2013-10-23 ENCOUNTER — Ambulatory Visit: Payer: Medicare Other | Admitting: Internal Medicine

## 2013-11-19 ENCOUNTER — Ambulatory Visit: Payer: Medicare Other | Admitting: Internal Medicine

## 2013-12-31 ENCOUNTER — Encounter (INDEPENDENT_AMBULATORY_CARE_PROVIDER_SITE_OTHER): Payer: Self-pay

## 2013-12-31 ENCOUNTER — Encounter: Payer: Self-pay | Admitting: Internal Medicine

## 2013-12-31 ENCOUNTER — Ambulatory Visit (INDEPENDENT_AMBULATORY_CARE_PROVIDER_SITE_OTHER): Payer: Medicare Other | Admitting: Internal Medicine

## 2013-12-31 VITALS — BP 114/78 | HR 74 | Ht 62.0 in | Wt 179.4 lb

## 2013-12-31 DIAGNOSIS — G4700 Insomnia, unspecified: Secondary | ICD-10-CM

## 2013-12-31 NOTE — Patient Instructions (Signed)
Claritin or Allegra for allergic noses if needed  Neosporin oint can be good on a Q-tip for sores in the nose  Look up Belsomra for insomnia- see if it would be of interest

## 2013-12-31 NOTE — Progress Notes (Signed)
Patient ID: VEENA STURGESS, female    DOB: December 18, 1945, 68 y.o.   MRN: [id number]  HPI - [name] followed here for allergic rhinitis/ conjunctivitis, and for insomnia with obstructive sleep apnea.  She has not tolerated CPAP well and has been educated on alternatives. Currently she has a trial set of Provent nasal valves and is still very tentative, but trying. Her tendency is toward "busy-brain" insomnia with difficulty initiating sleep. She is also interested in trying a Silent Night oral appliance offered by her dentist.  She has not been able to lose weight.  For seasonal allergic irritation of eyes, watering and itching, she asks sample Pataday.  10/11/10- OSA, insomnia, allergic rhinitis Provent didn't work out well and she still felt she couldn't sleep throught the night. She is going to look into an oral appliance with her dentist.  Weight loss and sleep hygiene goals reviewed. Spring associated nasal congestion and watery eyes are getting better.  12/13/10- [name] followed here for allergic rhinitis/ conjunctivitis, and for insomnia with obstructive sleep apnea, complicated by history of migraine, hypertension, GERD. She follows with Dr. Rex Kras for blood pressure management. He is evaluating recent description of aura with scheduled carotid Dopplers. Her primary sleep complaint remains difficulty initiating and maintaining sleep/insomnia. Obstructive sleep apnea has been identified and we have worked on it persistently. Unfortunately she has not tolerated CPAP or Provent nasal valves, and she has not been able to lose weight. Her dentist is interested in trying to make an oral appliance but apparently has limited experience with this. I have offered to send her to someone who has done a lot more of this kind of therapy. Restless legs have been identified in the past but does not seem to be a major clinical problem. Allergic rhinitis and conjunctivitis are managed symptomatically. Most recently she  had Pataday.  02/21/11- [name] followed here for allergic rhinitis/ conjunctivitis, and for insomnia with obstructive sleep apnea, complicated by history of migraine, hypertension, GERD. Since last here in August she has had no more paresthesias. She has given up on CPAP and Provent. nasal valves. Insomnia remains her primary sleep complaint. We have discussed this repeatedly and tried a number of approaches. Basic sleep hygiene is good. Fortunately workup for abnormal breast exam turned out benign. In the last 2-3 weeks she feels fall seasonal allergies bothering her. Eyes itch, water. Ears itch. She has a sore inside her right nostril from blowing her nose a lot. Clear mucus discharge. Denies chest tightness or wheeze.  05/02/11- [name] followed here for allergic rhinitis/ conjunctivitis, and for insomnia with obstructive sleep apnea, complicated by history of migraine, hypertension, GERD. Using clonazepam when needed for her insomnia. Failed to tolerate CPAP with multiple efforts, and the Provent nasal valves. Unable to lose weight. Lives alone with no reports of snoring or apnea. Did not get a dental oral device. Eyes have watered some, with little itch, sneeze or nasal congestion. Denies wheeze or cough.   07/04/11- [name] followed here for allergic rhinitis/ conjunctivitis, and for insomnia with obstructive sleep apnea/ failed CPAP, complicated by history of migraine, hypertension, GERD. Insomnia remains her primary sleep complaint. She is alone with no reports of snoring, witnessed apnea, or sleep disturbance by limb movements. We have discussed available treatments again. Seasonal rhinitis complaints are not yet the problem. We have reviewed symptomatic therapy is available if needed later in the spring. She is anticipating surgery for breast lump but does not know details yet. We do not anticipate  any respiratory concerns that would affect that surgery.  09/05/11- [name] followed here for  allergic rhinitis/ conjunctivitis, and for insomnia with obstructive sleep apnea/ failed CPAP, complicated by history of migraine, hypertension, GERD. Breast lump was a benign lipoma but she is not sure was adequately excised. Intermittent antihistamine. Never really sleeps well or without interruption. Pattern has not changed.  11/07/11- [name] followed here for allergic rhinitis/ conjunctivitis, and for insomnia with obstructive sleep apnea/ failed CPAP, complicated by history of migraine, hypertension, GERD. Pt still not using cpap and would like something new. We talked again about available treatments including oral appliances, nasal valves. We feel it is time for reassessment and she should qualify for a home unattended sleep study. Her biggest sleep complaint has always been difficulty initiating and maintaining sleep, mostly with difficulty relaxing. I suggested formal referral for a trial at [facility]. She is playing tennis and walking regularly with no acute problems.  01/11/12- 65 yoF never smoker followed here for allergic rhinitis/ conjunctivitis, and for insomnia with obstructive sleep apnea/ failed CPAP, complicated by history of migraine, hypertension, GERD. Her primary concern continues to be insomnia with difficulty initiating and maintaining sleep. She tried a wide Friday of interventions for sleep apnea but none improved her quality of life or ability to sleep well at night. 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 This creates additional stress, as do significant health problems in her family. We have discussed the impact on her sleep quality. She expresses intent to follow through with the cognitive behavioral therapy referral we had made. Allergy symptoms are generally controlled with OTC antihistamines now spring was the worst time of year.  03/12/12-  30 yoF never smoker followed here for allergic rhinitis/  conjunctivitis, and for insomnia with obstructive sleep apnea/ failed CPAP, complicated by history of migraine, hypertension, GERD. Still not sleeping well and would like to discuss her sleep med. Primary complaint is difficulty initiating and maintaining sleep. We have repeatedly reviewed sleep hygiene. Several medications tried as noted in record. She describes 2 hour sleep latency with Xanax 0.5 mg. Sleep soundly after she finally falls asleep. Lives alone. Stressful, rather political job with [employer].  08/12/13- 67 yoF never smoker followed here for allergic rhinitis/ conjunctivitis, and for insomnia with obstructive sleep apnea/ failed CPAP, complicated by history of migraine, hypertension, GERD FOLLOWS FOR:  Still does not wear cpap.  C/o sinus congestion, sneezing.       Review of Systems-see HPI Constitutional:   No-   weight loss, night sweats, fevers, chills, fatigue, lassitude. + insomnia HEENT:   No-  headaches, difficulty swallowing, tooth/dental problems, sore throat,       No sneezing, itching, ears itch, nasal congestion, post nasal drip,  CV:  No-   chest pain, orthopnea, PND, swelling in lower extremities, anasarca, dizziness, palpitations Resp: No-   shortness of breath with exertion or at rest.              No-   productive cough,  No non-productive cough,  No-  coughing up of blood.              No-   change in color of mucus.  No- wheezing.   Skin: No-   rash or lesions. GI:  No-   heartburn, indigestion, abdominal pain, nausea, vomiting,  GU: MS:  No-   joint pain or swelling.   Neuro- :  Psych:  No-  change in mood or affect. Some- depression or anxiety.  No memory loss.   Physical Exam  General- Alert, Oriented, Affect-appropriate, very pleasant, Distress- none acute, overweight Skin- rash-none, lesions- none, excoriation- none Lymphadenopathy- none Head- atraumatic            Eyes- Gross vision intact, PERRLA, conjunctivae clear secretions             Ears- Hearing, canals normal            Nose- , no -Septal dev, mucus, polyps, erosion, perforation. No turbinate edema or mucus bridging.            Throat- Mallampati III , mucosa clear , drainage- none, tonsils- atrophic Neck- flexible , trachea midline, no stridor , thyroid nl, carotid no bruit Chest - symmetrical excursion , unlabored           Heart/CV- RRR , no murmur , no gallop  , no rub, nl s1 s2                           - JVD- none , edema- none, stasis changes- none, varices- none           Lung- clear to P&A, wheeze- none, cough- none , dullness-none, rub- none           Chest wall-  Abd- Br/ Gen/ Rectal- Not done, not indicated Extrem- cyanosis- none, clubbing, none, atrophy- none, strength- nl Neuro- grossly intact to observation              Patient ID: Vita Barley, female    DOB: Jan 12, 1946, 68 y.o.   MRN: [id number]  HPI - 16yoF followed here for allergic rhinitis/ conjunctivitis, and for insomnia with obstructive sleep apnea.  She has not tolerated CPAP well and has been educated on alternatives. Currently she has a trial set of Provent nasal valves and is still very tentative, but trying. Her tendency is toward "busy-brain" insomnia with difficulty initiating sleep. She is also interested in trying a Silent Night oral appliance offered by her dentist.  She has not been able to lose weight.  For seasonal allergic irritation of eyes, watering and itching, she asks sample Pataday.  10/11/10- OSA, insomnia, allergic rhinitis Provent didn't work out well and she still felt she couldn't sleep throught the night. She is going to look into an oral appliance with her dentist.  Weight loss and sleep hygiene goals reviewed. Spring associated nasal congestion and watery eyes are getting better.  12/13/10- [name] followed here for allergic rhinitis/ conjunctivitis, and for insomnia with obstructive sleep apnea, complicated by history of migraine, hypertension, GERD. She  follows with Dr. Rex Kras for blood pressure management. He is evaluating recent description of aura with scheduled carotid Dopplers. Her primary sleep complaint remains difficulty initiating and maintaining sleep/insomnia. Obstructive sleep apnea has been identified and we have worked on it persistently. Unfortunately she has not tolerated CPAP or Provent nasal valves, and she has not been able to lose weight. Her dentist is interested in trying to make an oral appliance but apparently has limited experience with this. I have offered to send her to someone who has done a lot more of this kind of therapy. Restless legs have been identified in the past but does not seem to be a major clinical problem. Allergic rhinitis and conjunctivitis are managed symptomatically. Most recently she had Pataday.  02/21/11- [name] followed here for allergic rhinitis/ conjunctivitis, and for insomnia  with obstructive sleep apnea, complicated by history of migraine, hypertension, GERD. Since last here in August she has had no more paresthesias. She has given up on CPAP and Provent. nasal valves. Insomnia remains her primary sleep complaint. We have discussed this repeatedly and tried a number of approaches. Basic sleep hygiene is good. Fortunately workup for abnormal breast exam turned out benign. In the last 2-3 weeks she feels fall seasonal allergies bothering her. Eyes itch, water. Ears itch. She has a sore inside her right nostril from blowing her nose a lot. Clear mucus discharge. Denies chest tightness or wheeze.  05/02/11- [name] followed here for allergic rhinitis/ conjunctivitis, and for insomnia with obstructive sleep apnea, complicated by history of migraine, hypertension, GERD. Using clonazepam when needed for her insomnia. Failed to tolerate CPAP with multiple efforts, and the Provent nasal valves. Unable to lose weight. Lives alone with no reports of snoring or apnea. Did not get a dental oral device. Eyes have  watered some, with little itch, sneeze or nasal congestion. Denies wheeze or cough.   07/04/11- [name] followed here for allergic rhinitis/ conjunctivitis, and for insomnia with obstructive sleep apnea/ failed CPAP, complicated by history of migraine, hypertension, GERD. Insomnia remains her primary sleep complaint. She is alone with no reports of snoring, witnessed apnea, or sleep disturbance by limb movements. We have discussed available treatments again. Seasonal rhinitis complaints are not yet the problem. We have reviewed symptomatic therapy is available if needed later in the spring. She is anticipating surgery for breast lump but does not know details yet. We do not anticipate any respiratory concerns that would affect that surgery.  09/05/11- [name] followed here for allergic rhinitis/ conjunctivitis, and for insomnia with obstructive sleep apnea/ failed CPAP, complicated by history of migraine, hypertension, GERD. Breast lump was a benign lipoma but she is not sure was adequately excised. Intermittent antihistamine. Never really sleeps well or without interruption. Pattern has not changed.  11/07/11- [name] followed here for allergic rhinitis/ conjunctivitis, and for insomnia with obstructive sleep apnea/ failed CPAP, complicated by history of migraine, hypertension, GERD. Pt still not using cpap and would like something new. We talked again about available treatments including oral appliances, nasal valves. We feel it is time for reassessment and she should qualify for a home unattended sleep study. Her biggest sleep complaint has always been difficulty initiating and maintaining sleep, mostly with difficulty relaxing. I suggested formal referral for a trial at [facility]. She is playing tennis and walking regularly with no acute problems.  01/11/12- 65 yoF never smoker followed here for allergic rhinitis/ conjunctivitis, and for insomnia with obstructive sleep apnea/ failed  CPAP, complicated by history of migraine, hypertension, GERD. Her primary concern continues to be insomnia with difficulty initiating and maintaining sleep. She tried a wide Friday of interventions for sleep apnea but none improved her quality of life or ability to sleep well at night. 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 This creates additional stress, as do significant health problems in her family. We have discussed the impact on her sleep quality. She expresses intent to follow through with the cognitive behavioral therapy referral we had made. Allergy symptoms are generally controlled with OTC antihistamines now spring was the worst time of year.  03/12/12-  9 yoF never smoker followed here for allergic rhinitis/ conjunctivitis, and for insomnia with obstructive sleep apnea/ failed CPAP, complicated by history of migraine, hypertension, GERD. Still not sleeping well and would  like to discuss her sleep med. Primary complaint is difficulty initiating and maintaining sleep. We have repeatedly reviewed sleep hygiene. Several medications tried as noted in record. She describes 2 hour sleep latency with Xanax 0.5 mg. Sleep soundly after she finally falls asleep. Lives alone. Stressful, rather political job with [employer].  04/21/12- 51 yoF never smoker followed here for allergic rhinitis/ conjunctivitis, and for insomnia with obstructive sleep apnea/ failed CPAP, complicated by history of migraine, hypertension, GERD. ACUTE VISIT: started last week with GI upset, sore throat on Wenesday/Thursday, lost voice, cough-productive, pressure/pain in Right ear. Denies any fever but has had chills. Little loose phlegm, cough is mostly dry.. Taking supplemental vitamin D. Strep smear negative at her primary physician's office. Some chilling but no fever. GI now okay.  05/12/12  66 yoF never smoker followed here for allergic rhinitis/ conjunctivitis, and  for insomnia with obstructive sleep apnea/ failed CPAP and Provent, complicated by history of migraine, hypertension, GERD. FOLLOWS FOR: occasional dry cough; denies any wheezing or SOB.  She started a Z-Pak yesterday we'll recent rhonchi does without fever. Did have flu shot. She asks Diflucan available because of antibiotic/yeast. We discussed insomnia management again. She is alone and not aware of snoring. I mentioned a book I had found which might help.  09/25/12-66 yoF never smoker followed here for allergic rhinitis/ conjunctivitis, and for insomnia with obstructive sleep apnea/ failed CPAP and Provent, complicated by history of migraine, hypertension, GERD. FOLLOWS FOR: watery eyes, slight cough, feels stuffy from pollen Chronic insomnia pattern is not really changed. She has been under some stress recently related to her job for the [employer]  12/03/12- 66 yoF never smoker followed here for allergic rhinitis/ conjunctivitis, and for insomnia with obstructive sleep apnea/ failed CPAP and Provent, complicated by history of migraine, hypertension, GERD. FOLLOWS FOR: having stuffiness at times; other wise no cough, SOB, wheezing, or congestion. Chronic insomnia pattern is up and down but persistent. Clonazepam 0.5 mg has help when used occasionally. Sleep apnea status is uncertain but not obtrusive. She lives alone. Treatments all interfered with insomnia more than they helped. Occasional rhinitis, mostly treated OTC  02/06/13- 66 yoF never smoker followed here for allergic rhinitis/ conjunctivitis, and for insomnia with obstructive sleep apnea/ failed CPAP and Provent, complicated by history of migraine, hypertension, GERD. FOLLOWS FOR: denies any wheezing, SOB, cough, or congestion. Still having leg jerks and just not sleeping She lives alone and admits she"doesn't like night time and doesn't want to go to sleep". We discussed her experience with meds and she is willing to try Xanax  plus Amitriptyline at bedtime.  04/10/13- 66 yoF never smoker followed here for allergic rhinitis/ conjunctivitis, and for insomnia with obstructive sleep apnea/ failed CPAP and Provent, complicated by history of migraine, hypertension, GERD FOLLOWS FOR:  Breathing doing well.  Still having trouble sleeping ,4 hours per night.  Not using CPAP. She did not follow through with the cognitive behavioral therapy or oral appliance suggestions. We discussed medications and her lifestyle. She lives alone.  06/10/13- 66 yoF never smoker followed here for allergic rhinitis/ conjunctivitis, and for insomnia with obstructive sleep apnea/ failed CPAP and Provent, complicated by history of migraine, hypertension, GERD FOLLOWS FOR: sleeps for about 4.5 hours each night; still not using CPAP -can not stand it. She has tried CPAP and BiPAP, been educated on oral appliances and the importance of maintaining normal body weight. Her primary sleep complaint is insomnia and sleep apnea  interventions have made that worse. She feels her sleep status is stable and she lives with it. Educated again on sleep hygiene. Bothered recently by watery rhinorrhea. Discussed antihistamines. Occasional soreness inside nostrils from wiping her nose, using Neosporin.  Review of Systems-see HPI Constitutional:   No-   weight loss, night sweats, fevers, chills, fatigue, lassitude. + insomnia HEENT:   No-  headaches, difficulty swallowing, tooth/dental problems, no-sore throat,       No- sneezing, itching, no-ears itch,no-nasal congestion, post nasal drip,  CV:  No-   chest pain, orthopnea, PND, swelling in lower extremities, anasarca, dizziness, palpitations Resp: No-   shortness of breath with exertion or at rest.             No- productive cough,  No- non-productive cough,  No-  coughing up of blood.              No-   change in color of mucus.  No- wheezing.   Skin: No-   rash or lesions. GI:  No-   heartburn, indigestion, abdominal  pain, nausea, vomiting,  GU: MS:  No-   joint pain or swelling.   Neuro- :  Psych:  No- change in mood or affect. Some- depression or anxiety.  No memory loss.   Physical Exam  General- Alert, Oriented, Affect-appropriate, very pleasant, Distress- none acute, overweight Skin- rash-none, lesions- none, excoriation- none Lymphadenopathy- none Head- atraumatic            Eyes- Gross vision intact, PERRLA, conjunctivae clear secretions            Ears- Hearing, canals normal            Nose- +mild turbinate edema, No- sores,  no -Septal dev, mucus, polyps, erosion, perforation.             Throat- Mallampati III , mucosa clear-not read , drainage- none, tonsils- atrophic   Neck- flexible , trachea midline, no stridor , thyroid nl, carotid no bruit Chest - symmetrical excursion , unlabored           Heart/CV- RRR , no murmur , no gallop  , no rub, nl s1 s2                           - JVD- none , edema- none, stasis changes- none, varices- none           Lung- clear to P&A, wheeze- none, no-cough  , dullness-none, rub- none           Chest wall-  Abd- Br/ Gen/ Rectal- Not done, not indicated Extrem- cyanosis- none, clubbing, none, atrophy- none, strength- nl Neuro- grossly intact to observation              Patient ID: Vita Barley, female    DOB: 1946/03/09, 68 y.o.   MRN: [id number]  HPI - 5yoF followed here for allergic rhinitis/ conjunctivitis, and for insomnia with obstructive sleep apnea.  She has not tolerated CPAP well and has been educated on alternatives. Currently she has a trial set of Provent nasal valves and is still very tentative, but trying. Her tendency is toward "busy-brain" insomnia with difficulty initiating sleep. She is also interested in trying a Silent Night oral appliance offered by her dentist.  She has not been able to lose weight.  For seasonal allergic irritation of eyes, watering and itching, she asks sample Pataday.  10/11/10- OSA, insomnia,  allergic rhinitis Provent  didn't work out well and she still felt she couldn't sleep throught the night. She is going to look into an oral appliance with her dentist.  Weight loss and sleep hygiene goals reviewed. Spring associated nasal congestion and watery eyes are getting better.  12/13/10- [name] followed here for allergic rhinitis/ conjunctivitis, and for insomnia with obstructive sleep apnea, complicated by history of migraine, hypertension, GERD. She follows with Dr. Rex Kras for blood pressure management. He is evaluating recent description of aura with scheduled carotid Dopplers. Her primary sleep complaint remains difficulty initiating and maintaining sleep/insomnia. Obstructive sleep apnea has been identified and we have worked on it persistently. Unfortunately she has not tolerated CPAP or Provent nasal valves, and she has not been able to lose weight. Her dentist is interested in trying to make an oral appliance but apparently has limited experience with this. I have offered to send her to someone who has done a lot more of this kind of therapy. Restless legs have been identified in the past but does not seem to be a major clinical problem. Allergic rhinitis and conjunctivitis are managed symptomatically. Most recently she had Pataday.  02/21/11- [name] followed here for allergic rhinitis/ conjunctivitis, and for insomnia with obstructive sleep apnea, complicated by history of migraine, hypertension, GERD. Since last here in August she has had no more paresthesias. She has given up on CPAP and Provent. nasal valves. Insomnia remains her primary sleep complaint. We have discussed this repeatedly and tried a number of approaches. Basic sleep hygiene is good. Fortunately workup for abnormal breast exam turned out benign. In the last 2-3 weeks she feels fall seasonal allergies bothering her. Eyes itch, water. Ears itch. She has a sore inside her right nostril from blowing her nose a lot. Clear  mucus discharge. Denies chest tightness or wheeze.  05/02/11- [name] followed here for allergic rhinitis/ conjunctivitis, and for insomnia with obstructive sleep apnea, complicated by history of migraine, hypertension, GERD. Using clonazepam when needed for her insomnia. Failed to tolerate CPAP with multiple efforts, and the Provent nasal valves. Unable to lose weight. Lives alone with no reports of snoring or apnea. Did not get a dental oral device. Eyes have watered some, with little itch, sneeze or nasal congestion. Denies wheeze or cough.   07/04/11- [name] followed here for allergic rhinitis/ conjunctivitis, and for insomnia with obstructive sleep apnea/ failed CPAP, complicated by history of migraine, hypertension, GERD. Insomnia remains her primary sleep complaint. She is alone with no reports of snoring, witnessed apnea, or sleep disturbance by limb movements. We have discussed available treatments again. Seasonal rhinitis complaints are not yet the problem. We have reviewed symptomatic therapy is available if needed later in the spring. She is anticipating surgery for breast lump but does not know details yet. We do not anticipate any respiratory concerns that would affect that surgery.  09/05/11- [name] followed here for allergic rhinitis/ conjunctivitis, and for insomnia with obstructive sleep apnea/ failed CPAP, complicated by history of migraine, hypertension, GERD. Breast lump was a benign lipoma but she is not sure was adequately excised. Intermittent antihistamine. Never really sleeps well or without interruption. Pattern has not changed.  11/07/11- [name] followed here for allergic rhinitis/ conjunctivitis, and for insomnia with obstructive sleep apnea/ failed CPAP, complicated by history of migraine, hypertension, GERD. Pt still not using cpap and would like something new. We talked again about available treatments including oral appliances, nasal valves. We feel it is time for  reassessment and she should qualify for a  home unattended sleep study. Her biggest sleep complaint has always been difficulty initiating and maintaining sleep, mostly with difficulty relaxing. I suggested formal referral for a trial at [facility]. She is playing tennis and walking regularly with no acute problems.  01/11/12- 65 yoF never smoker followed here for allergic rhinitis/ conjunctivitis, and for insomnia with obstructive sleep apnea/ failed CPAP, complicated by history of migraine, hypertension, GERD. Her primary concern continues to be insomnia with difficulty initiating and maintaining sleep. She tried a wide Friday of interventions for sleep apnea but none improved her quality of life or ability to sleep well at night. 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 This creates additional stress, as do significant health problems in her family. We have discussed the impact on her sleep quality. She expresses intent to follow through with the cognitive behavioral therapy referral we had made. Allergy symptoms are generally controlled with OTC antihistamines now spring was the worst time of year.  03/12/12-  74 yoF never smoker followed here for allergic rhinitis/ conjunctivitis, and for insomnia with obstructive sleep apnea/ failed CPAP, complicated by history of migraine, hypertension, GERD. Still not sleeping well and would like to discuss her sleep med. Primary complaint is difficulty initiating and maintaining sleep. We have repeatedly reviewed sleep hygiene. Several medications tried as noted in record. She describes 2 hour sleep latency with Xanax 0.5 mg. Sleep soundly after she finally falls asleep. Lives alone. Stressful, rather political job with [employer].  Review of Systems-see HPI Constitutional:   No-   weight loss, night sweats, fevers, chills, fatigue, lassitude. + insomnia HEENT:   No-  headaches, difficulty  swallowing, tooth/dental problems, sore throat,       No sneezing, itching, ears itch, nasal congestion, post nasal drip,  CV:  No-   chest pain, orthopnea, PND, swelling in lower extremities, anasarca, dizziness, palpitations Resp: No-   shortness of breath with exertion or at rest.              No-   productive cough,  No non-productive cough,  No-  coughing up of blood.              No-   change in color of mucus.  No- wheezing.   Skin: No-   rash or lesions. GI:  No-   heartburn, indigestion, abdominal pain, nausea, vomiting,  GU: MS:  No-   joint pain or swelling.   Neuro- :  Psych:  No- change in mood or affect. Some- depression or anxiety.  No memory loss.   Physical Exam  General- Alert, Oriented, Affect-appropriate, very pleasant, Distress- none acute, overweight Skin- rash-none, lesions- none, excoriation- none Lymphadenopathy- none Head- atraumatic            Eyes- Gross vision intact, PERRLA, conjunctivae clear secretions            Ears- Hearing, canals normal            Nose- , no -Septal dev, mucus, polyps, erosion, perforation. No turbinate edema or mucus bridging.            Throat- Mallampati III , mucosa clear , drainage- none, tonsils- atrophic Neck- flexible , trachea midline, no stridor , thyroid nl, carotid no bruit Chest - symmetrical excursion , unlabored           Heart/CV- RRR , no murmur , no gallop  , no rub, nl s1 s2                           -  JVD- none , edema- none, stasis changes- none, varices- none           Lung- clear to P&A, wheeze- none, cough- none , dullness-none, rub- none           Chest wall-  Abd- Br/ Gen/ Rectal- Not done, not indicated Extrem- cyanosis- none, clubbing, none, atrophy- none, strength- nl Neuro- grossly intact to observation              Patient ID: Vita Barley, female    DOB: 1945/11/18, 68 y.o.   MRN: [id number]  HPI - 86yoF followed here for allergic rhinitis/ conjunctivitis, and for insomnia with  obstructive sleep apnea.  She has not tolerated CPAP well and has been educated on alternatives. Currently she has a trial set of Provent nasal valves and is still very tentative, but trying. Her tendency is toward "busy-brain" insomnia with difficulty initiating sleep. She is also interested in trying a Silent Night oral appliance offered by her dentist.  She has not been able to lose weight.  For seasonal allergic irritation of eyes, watering and itching, she asks sample Pataday.  10/11/10- OSA, insomnia, allergic rhinitis Provent didn't work out well and she still felt she couldn't sleep throught the night. She is going to look into an oral appliance with her dentist.  Weight loss and sleep hygiene goals reviewed. Spring associated nasal congestion and watery eyes are getting better.  12/13/10- [name] followed here for allergic rhinitis/ conjunctivitis, and for insomnia with obstructive sleep apnea, complicated by history of migraine, hypertension, GERD. She follows with Dr. Rex Kras for blood pressure management. He is evaluating recent description of aura with scheduled carotid Dopplers. Her primary sleep complaint remains difficulty initiating and maintaining sleep/insomnia. Obstructive sleep apnea has been identified and we have worked on it persistently. Unfortunately she has not tolerated CPAP or Provent nasal valves, and she has not been able to lose weight. Her dentist is interested in trying to make an oral appliance but apparently has limited experience with this. I have offered to send her to someone who has done a lot more of this kind of therapy. Restless legs have been identified in the past but does not seem to be a major clinical problem. Allergic rhinitis and conjunctivitis are managed symptomatically. Most recently she had Pataday.  02/21/11- [name] followed here for allergic rhinitis/ conjunctivitis, and for insomnia with obstructive sleep apnea, complicated by history of migraine,  hypertension, GERD. Since last here in August she has had no more paresthesias. She has given up on CPAP and Provent. nasal valves. Insomnia remains her primary sleep complaint. We have discussed this repeatedly and tried a number of approaches. Basic sleep hygiene is good. Fortunately workup for abnormal breast exam turned out benign. In the last 2-3 weeks she feels fall seasonal allergies bothering her. Eyes itch, water. Ears itch. She has a sore inside her right nostril from blowing her nose a lot. Clear mucus discharge. Denies chest tightness or wheeze.  05/02/11- [name] followed here for allergic rhinitis/ conjunctivitis, and for insomnia with obstructive sleep apnea, complicated by history of migraine, hypertension, GERD. Using clonazepam when needed for her insomnia. Failed to tolerate CPAP with multiple efforts, and the Provent nasal valves. Unable to lose weight. Lives alone with no reports of snoring or apnea. Did not get a dental oral device. Eyes have watered some, with little itch, sneeze or nasal congestion. Denies wheeze or cough.   07/04/11- [name] followed here for allergic rhinitis/ conjunctivitis, and for  insomnia with obstructive sleep apnea/ failed CPAP, complicated by history of migraine, hypertension, GERD. Insomnia remains her primary sleep complaint. She is alone with no reports of snoring, witnessed apnea, or sleep disturbance by limb movements. We have discussed available treatments again. Seasonal rhinitis complaints are not yet the problem. We have reviewed symptomatic therapy is available if needed later in the spring. She is anticipating surgery for breast lump but does not know details yet. We do not anticipate any respiratory concerns that would affect that surgery.  09/05/11- [name] followed here for allergic rhinitis/ conjunctivitis, and for insomnia with obstructive sleep apnea/ failed CPAP, complicated by history of migraine, hypertension, GERD. Breast lump was a  benign lipoma but she is not sure was adequately excised. Intermittent antihistamine. Never really sleeps well or without interruption. Pattern has not changed.  11/07/11- [name] followed here for allergic rhinitis/ conjunctivitis, and for insomnia with obstructive sleep apnea/ failed CPAP, complicated by history of migraine, hypertension, GERD. Pt still not using cpap and would like something new. We talked again about available treatments including oral appliances, nasal valves. We feel it is time for reassessment and she should qualify for a home unattended sleep study. Her biggest sleep complaint has always been difficulty initiating and maintaining sleep, mostly with difficulty relaxing. I suggested formal referral for a trial at [facility]. She is playing tennis and walking regularly with no acute problems.  01/11/12- 65 yoF never smoker followed here for allergic rhinitis/ conjunctivitis, and for insomnia with obstructive sleep apnea/ failed CPAP, complicated by history of migraine, hypertension, GERD. Her primary concern continues to be insomnia with difficulty initiating and maintaining sleep. She tried a wide Friday of interventions for sleep apnea but none improved her quality of life or ability to sleep well at night. 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 This creates additional stress, as do significant health problems in her family. We have discussed the impact on her sleep quality. She expresses intent to follow through with the cognitive behavioral therapy referral we had made. Allergy symptoms are generally controlled with OTC antihistamines now spring was the worst time of year.  03/12/12-  69 yoF never smoker followed here for allergic rhinitis/ conjunctivitis, and for insomnia with obstructive sleep apnea/ failed CPAP, complicated by history of migraine, hypertension, GERD. Still not sleeping well and would like to discuss  her sleep med. Primary complaint is difficulty initiating and maintaining sleep. We have repeatedly reviewed sleep hygiene. Several medications tried as noted in record. She describes 2 hour sleep latency with Xanax 0.5 mg. Sleep soundly after she finally falls asleep. Lives alone. Stressful, rather political job with [employer].  04/21/12- 51 yoF never smoker followed here for allergic rhinitis/ conjunctivitis, and for insomnia with obstructive sleep apnea/ failed CPAP, complicated by history of migraine, hypertension, GERD. ACUTE VISIT: started last week with GI upset, sore throat on Wenesday/Thursday, lost voice, cough-productive, pressure/pain in Right ear. Denies any fever but has had chills. Little loose phlegm, cough is mostly dry.. Taking supplemental vitamin D. Strep smear negative at her primary physician's office. Some chilling but no fever. GI now okay.  05/12/12  66 yoF never smoker followed here for allergic rhinitis/ conjunctivitis, and for insomnia with obstructive sleep apnea/ failed CPAP and Provent, complicated by history of migraine, hypertension, GERD. FOLLOWS FOR: occasional dry cough; denies any wheezing or SOB.  She started a Z-Pak yesterday we'll recent rhonchi does without fever. Did have  flu shot. She asks Diflucan available because of antibiotic/yeast. We discussed insomnia management again. She is alone and not aware of snoring. I mentioned a book I had found which might help.  09/25/12-66 yoF never smoker followed here for allergic rhinitis/ conjunctivitis, and for insomnia with obstructive sleep apnea/ failed CPAP and Provent, complicated by history of migraine, hypertension, GERD. FOLLOWS FOR: watery eyes, slight cough, feels stuffy from pollen Chronic insomnia pattern is not really changed. She has been under some stress recently related to her job for the [employer]  12/03/12- 66 yoF never smoker followed here for allergic rhinitis/  conjunctivitis, and for insomnia with obstructive sleep apnea/ failed CPAP and Provent, complicated by history of migraine, hypertension, GERD. FOLLOWS FOR: having stuffiness at times; other wise no cough, SOB, wheezing, or congestion. Chronic insomnia pattern is up and down but persistent. Clonazepam 0.5 mg has help when used occasionally. Sleep apnea status is uncertain but not obtrusive. She lives alone. Treatments all interfered with insomnia more than they helped. Occasional rhinitis, mostly treated OTC  02/06/13- 66 yoF never smoker followed here for allergic rhinitis/ conjunctivitis, and for insomnia with obstructive sleep apnea/ failed CPAP and Provent, complicated by history of migraine, hypertension, GERD. FOLLOWS FOR: denies any wheezing, SOB, cough, or congestion. Still having leg jerks and just not sleeping She lives alone and admits she"doesn't like night time and doesn't want to go to sleep". We discussed her experience with meds and she is willing to try Xanax plus Amitriptyline at bedtime.  04/10/13- 66 yoF never smoker followed here for allergic rhinitis/ conjunctivitis, and for insomnia with obstructive sleep apnea/ failed CPAP and Provent, complicated by history of migraine, hypertension, GERD FOLLOWS FOR:  Breathing doing well.  Still having trouble sleeping ,4 hours per night.  Not using CPAP. She did not follow through with the cognitive behavioral therapy or oral appliance suggestions. We discussed medications and her lifestyle. She lives alone.  06/10/13- 66 yoF never smoker followed here for allergic rhinitis/ conjunctivitis, and for insomnia with obstructive sleep apnea/ failed CPAP and Provent, complicated by history of migraine, hypertension, GERD FOLLOWS FOR: sleeps for about 4.5 hours each night; still not using CPAP -can not stand it. She has tried CPAP and BiPAP, been educated on oral appliances and the importance of maintaining normal body weight. Her primary sleep  complaint is insomnia and sleep apnea interventions have made that worse. She feels her sleep status is stable and she lives with it. Educated again on sleep hygiene. Bothered recently by watery rhinorrhea. Discussed antihistamines. Occasional soreness inside nostrils from wiping her nose, using Neosporin.  09/10/13- 67 yoF never smoker followed here for allergic rhinitis/ conjunctivitis, and for insomnia with obstructive sleep apnea/ failed CPAP and Provent, complicated by history of migraine, hypertension, GERD Minor seasonal stuffiness, no infection or chest discomfort. Antihistamines if needed. Sleep- bedtime 1:30-6:30 or 7:00AM. Admits tired. Discussed sleep hygiene.   12/31/13   Review of Systems-see HPI Constitutional:   No-   weight loss, night sweats, fevers, chills, fatigue, lassitude. + insomnia HEENT:   No-  headaches, difficulty swallowing, tooth/dental problems, no-sore throat,       No- sneezing, itching, no-ears itch,+nasal congestion, post nasal drip,  CV:  No-   chest pain, orthopnea, PND, swelling in lower extremities, anasarca, dizziness, palpitations Resp: No-   shortness of breath with exertion or at rest.             No- productive cough,  No- non-productive cough,  No-  coughing up of blood.              No-   change in color of mucus.  No- wheezing.   Skin: No-   rash or lesions. GI:  No-   heartburn, indigestion, abdominal pain, nausea, vomiting,  GU: MS:  No-   joint pain or swelling.   Neuro- :  Psych:  No- change in mood or affect. Some- depression or anxiety.  No memory loss.   Physical Exam  General- Alert, Oriented, Affect-appropriate, very pleasant, Distress- none acute, overweight Skin- rash-none, lesions- none, excoriation- none Lymphadenopathy- none Head- atraumatic            Eyes- Gross vision intact, PERRLA, conjunctivae clear secretions            Ears- Hearing, canals normal            Nose- +mild turbinate edema, No- sores,  no -Septal dev,  mucus, polyps, erosion, perforation.             Throat- Mallampati III , mucosa clear-not read , drainage- none, tonsils- atrophic   Neck- flexible , trachea midline, no stridor , thyroid nl, carotid no bruit Chest - symmetrical excursion , unlabored           Heart/CV- RRR , no murmur , no gallop  , no rub, nl s1 s2                           - JVD- none , edema- none, stasis changes- none, varices- none           Lung- clear to P&A, wheeze- none, no-cough  , dullness-none, rub- none           Chest wall-  Abd- Br/ Gen/ Rectal- Not done, not indicated Extrem- cyanosis- none, clubbing, none, atrophy- none, strength- nl Neuro- grossly intact to observation

## 2014-02-18 ENCOUNTER — Encounter: Payer: Self-pay | Admitting: Gynecology

## 2014-02-25 ENCOUNTER — Encounter: Payer: Medicare Other | Admitting: Gynecology

## 2014-03-03 ENCOUNTER — Encounter: Payer: Self-pay | Admitting: Internal Medicine

## 2014-03-03 ENCOUNTER — Ambulatory Visit (INDEPENDENT_AMBULATORY_CARE_PROVIDER_SITE_OTHER): Payer: Medicare Other | Admitting: Internal Medicine

## 2014-03-03 ENCOUNTER — Encounter (INDEPENDENT_AMBULATORY_CARE_PROVIDER_SITE_OTHER): Payer: Self-pay

## 2014-03-03 VITALS — BP 128/70 | HR 79 | Temp 98.2°F | Ht 62.0 in | Wt 181.2 lb

## 2014-03-03 DIAGNOSIS — Z23 Encounter for immunization: Secondary | ICD-10-CM

## 2014-03-03 MED ORDER — AZITHROMYCIN 250 MG PO TABS: ORAL_TABLET | ORAL | Status: DC

## 2014-03-03 NOTE — Patient Instructions (Signed)
Script sent for Z pak  Flu vax

## 2014-03-03 NOTE — Progress Notes (Signed)
Patient ID: Teresa Molina, female    DOB: December 18, 1945, 68 y.o.   MRN: [id number]  HPI - [name] followed here for allergic rhinitis/ conjunctivitis, and for insomnia with obstructive sleep apnea.  She has not tolerated CPAP well and has been educated on alternatives. Currently she has a trial set of Provent nasal valves and is still very tentative, but trying. Her tendency is toward "busy-brain" insomnia with difficulty initiating sleep. She is also interested in trying a Silent Night oral appliance offered by her dentist.  She has not been able to lose weight.  For seasonal allergic irritation of eyes, watering and itching, she asks sample Pataday.  10/11/10- OSA, insomnia, allergic rhinitis Provent didn't work out well and she still felt she couldn't sleep throught the night. She is going to look into an oral appliance with her dentist.  Weight loss and sleep hygiene goals reviewed. Spring associated nasal congestion and watery eyes are getting better.  12/13/10- [name] followed here for allergic rhinitis/ conjunctivitis, and for insomnia with obstructive sleep apnea, complicated by history of migraine, hypertension, GERD. She follows with Dr. Rex Kras for blood pressure management. He is evaluating recent description of aura with scheduled carotid Dopplers. Her primary sleep complaint remains difficulty initiating and maintaining sleep/insomnia. Obstructive sleep apnea has been identified and we have worked on it persistently. Unfortunately she has not tolerated CPAP or Provent nasal valves, and she has not been able to lose weight. Her dentist is interested in trying to make an oral appliance but apparently has limited experience with this. I have offered to send her to someone who has done a lot more of this kind of therapy. Restless legs have been identified in the past but does not seem to be a major clinical problem. Allergic rhinitis and conjunctivitis are managed symptomatically. Most recently she  had Pataday.  02/21/11- [name] followed here for allergic rhinitis/ conjunctivitis, and for insomnia with obstructive sleep apnea, complicated by history of migraine, hypertension, GERD. Since last here in August she has had no more paresthesias. She has given up on CPAP and Provent. nasal valves. Insomnia remains her primary sleep complaint. We have discussed this repeatedly and tried a number of approaches. Basic sleep hygiene is good. Fortunately workup for abnormal breast exam turned out benign. In the last 2-3 weeks she feels fall seasonal allergies bothering her. Eyes itch, water. Ears itch. She has a sore inside her right nostril from blowing her nose a lot. Clear mucus discharge. Denies chest tightness or wheeze.  05/02/11- [name] followed here for allergic rhinitis/ conjunctivitis, and for insomnia with obstructive sleep apnea, complicated by history of migraine, hypertension, GERD. Using clonazepam when needed for her insomnia. Failed to tolerate CPAP with multiple efforts, and the Provent nasal valves. Unable to lose weight. Lives alone with no reports of snoring or apnea. Did not get a dental oral device. Eyes have watered some, with little itch, sneeze or nasal congestion. Denies wheeze or cough.   07/04/11- [name] followed here for allergic rhinitis/ conjunctivitis, and for insomnia with obstructive sleep apnea/ failed CPAP, complicated by history of migraine, hypertension, GERD. Insomnia remains her primary sleep complaint. She is alone with no reports of snoring, witnessed apnea, or sleep disturbance by limb movements. We have discussed available treatments again. Seasonal rhinitis complaints are not yet the problem. We have reviewed symptomatic therapy is available if needed later in the spring. She is anticipating surgery for breast lump but does not know details yet. We do not anticipate  any respiratory concerns that would affect that surgery.  09/05/11- [name] followed here for  allergic rhinitis/ conjunctivitis, and for insomnia with obstructive sleep apnea/ failed CPAP, complicated by history of migraine, hypertension, GERD. Breast lump was a benign lipoma but she is not sure was adequately excised. Intermittent antihistamine. Never really sleeps well or without interruption. Pattern has not changed.  11/07/11- [name] followed here for allergic rhinitis/ conjunctivitis, and for insomnia with obstructive sleep apnea/ failed CPAP, complicated by history of migraine, hypertension, GERD. Pt still not using cpap and would like something new. We talked again about available treatments including oral appliances, nasal valves. We feel it is time for reassessment and she should qualify for a home unattended sleep study. Her biggest sleep complaint has always been difficulty initiating and maintaining sleep, mostly with difficulty relaxing. I suggested formal referral for a trial at [facility]. She is playing tennis and walking regularly with no acute problems.  01/11/12- 68 yoF never smoker followed here for allergic rhinitis/ conjunctivitis, and for insomnia with obstructive sleep apnea/ failed CPAP, complicated by history of migraine, hypertension, GERD. Her primary concern continues to be insomnia with difficulty initiating and maintaining sleep. She tried a wide Friday of interventions for sleep apnea but none improved her quality of life or ability to sleep well at night. 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 This creates additional stress, as do significant health problems in her family. We have discussed the impact on her sleep quality. She expresses intent to follow through with the cognitive behavioral therapy referral we had made. Allergy symptoms are generally controlled with OTC antihistamines now spring was the worst time of year.  03/12/12-  68 yoF never smoker followed here for allergic rhinitis/  conjunctivitis, and for insomnia with obstructive sleep apnea/ failed CPAP, complicated by history of migraine, hypertension, GERD. Still not sleeping well and would like to discuss her sleep med. Primary complaint is difficulty initiating and maintaining sleep. We have repeatedly reviewed sleep hygiene. Several medications tried as noted in record. She describes 2 hour sleep latency with Xanax 0.5 mg. Sleep soundly after she finally falls asleep. Lives alone. Stressful, rather political job with [employer].  08/12/13- 67 yoF never smoker followed here for allergic rhinitis/ conjunctivitis, and for insomnia with obstructive sleep apnea/ failed CPAP, complicated by history of migraine, hypertension, GERD FOLLOWS FOR:  Still does not wear cpap.  C/o sinus congestion, sneezing.       Review of Systems-see HPI Constitutional:   No-   weight loss, night sweats, fevers, chills, fatigue, lassitude. + insomnia HEENT:   No-  headaches, difficulty swallowing, tooth/dental problems, sore throat,       No sneezing, itching, ears itch, nasal congestion, post nasal drip,  CV:  No-   chest pain, orthopnea, PND, swelling in lower extremities, anasarca, dizziness, palpitations Resp: No-   shortness of breath with exertion or at rest.              No-   productive cough,  No non-productive cough,  No-  coughing up of blood.              No-   change in color of mucus.  No- wheezing.   Skin: No-   rash or lesions. GI:  No-   heartburn, indigestion, abdominal pain, nausea, vomiting,  GU: MS:  No-   joint pain or swelling.   Neuro- :  Psych:  No-  change in mood or affect. Some- depression or anxiety.  No memory loss.   Physical Exam  General- Alert, Oriented, Affect-appropriate, very pleasant, Distress- none acute, overweight Skin- rash-none, lesions- none, excoriation- none Lymphadenopathy- none Head- atraumatic            Eyes- Gross vision intact, PERRLA, conjunctivae clear secretions             Ears- Hearing, canals normal            Nose- , no -Septal dev, mucus, polyps, erosion, perforation. No turbinate edema or mucus bridging.            Throat- Mallampati III , mucosa clear , drainage- none, tonsils- atrophic Neck- flexible , trachea midline, no stridor , thyroid nl, carotid no bruit Chest - symmetrical excursion , unlabored           Heart/CV- RRR , no murmur , no gallop  , no rub, nl s1 s2                           - JVD- none , edema- none, stasis changes- none, varices- none           Lung- clear to P&A, wheeze- none, cough- none , dullness-none, rub- none           Chest wall-  Abd- Br/ Gen/ Rectal- Not done, not indicated Extrem- cyanosis- none, clubbing, none, atrophy- none, strength- nl Neuro- grossly intact to observation              Patient ID: Teresa Molina, female    DOB: Jan 12, 1946, 68 y.o.   MRN: [id number]  HPI - 16yoF followed here for allergic rhinitis/ conjunctivitis, and for insomnia with obstructive sleep apnea.  She has not tolerated CPAP well and has been educated on alternatives. Currently she has a trial set of Provent nasal valves and is still very tentative, but trying. Her tendency is toward "busy-brain" insomnia with difficulty initiating sleep. She is also interested in trying a Silent Night oral appliance offered by her dentist.  She has not been able to lose weight.  For seasonal allergic irritation of eyes, watering and itching, she asks sample Pataday.  10/11/10- OSA, insomnia, allergic rhinitis Provent didn't work out well and she still felt she couldn't sleep throught the night. She is going to look into an oral appliance with her dentist.  Weight loss and sleep hygiene goals reviewed. Spring associated nasal congestion and watery eyes are getting better.  12/13/10- [name] followed here for allergic rhinitis/ conjunctivitis, and for insomnia with obstructive sleep apnea, complicated by history of migraine, hypertension, GERD. She  follows with Dr. Rex Kras for blood pressure management. He is evaluating recent description of aura with scheduled carotid Dopplers. Her primary sleep complaint remains difficulty initiating and maintaining sleep/insomnia. Obstructive sleep apnea has been identified and we have worked on it persistently. Unfortunately she has not tolerated CPAP or Provent nasal valves, and she has not been able to lose weight. Her dentist is interested in trying to make an oral appliance but apparently has limited experience with this. I have offered to send her to someone who has done a lot more of this kind of therapy. Restless legs have been identified in the past but does not seem to be a major clinical problem. Allergic rhinitis and conjunctivitis are managed symptomatically. Most recently she had Pataday.  02/21/11- [name] followed here for allergic rhinitis/ conjunctivitis, and for insomnia  with obstructive sleep apnea, complicated by history of migraine, hypertension, GERD. Since last here in August she has had no more paresthesias. She has given up on CPAP and Provent. nasal valves. Insomnia remains her primary sleep complaint. We have discussed this repeatedly and tried a number of approaches. Basic sleep hygiene is good. Fortunately workup for abnormal breast exam turned out benign. In the last 2-3 weeks she feels fall seasonal allergies bothering her. Eyes itch, water. Ears itch. She has a sore inside her right nostril from blowing her nose a lot. Clear mucus discharge. Denies chest tightness or wheeze.  05/02/11- [name] followed here for allergic rhinitis/ conjunctivitis, and for insomnia with obstructive sleep apnea, complicated by history of migraine, hypertension, GERD. Using clonazepam when needed for her insomnia. Failed to tolerate CPAP with multiple efforts, and the Provent nasal valves. Unable to lose weight. Lives alone with no reports of snoring or apnea. Did not get a dental oral device. Eyes have  watered some, with little itch, sneeze or nasal congestion. Denies wheeze or cough.   07/04/11- [name] followed here for allergic rhinitis/ conjunctivitis, and for insomnia with obstructive sleep apnea/ failed CPAP, complicated by history of migraine, hypertension, GERD. Insomnia remains her primary sleep complaint. She is alone with no reports of snoring, witnessed apnea, or sleep disturbance by limb movements. We have discussed available treatments again. Seasonal rhinitis complaints are not yet the problem. We have reviewed symptomatic therapy is available if needed later in the spring. She is anticipating surgery for breast lump but does not know details yet. We do not anticipate any respiratory concerns that would affect that surgery.  09/05/11- [name] followed here for allergic rhinitis/ conjunctivitis, and for insomnia with obstructive sleep apnea/ failed CPAP, complicated by history of migraine, hypertension, GERD. Breast lump was a benign lipoma but she is not sure was adequately excised. Intermittent antihistamine. Never really sleeps well or without interruption. Pattern has not changed.  11/07/11- [name] followed here for allergic rhinitis/ conjunctivitis, and for insomnia with obstructive sleep apnea/ failed CPAP, complicated by history of migraine, hypertension, GERD. Pt still not using cpap and would like something new. We talked again about available treatments including oral appliances, nasal valves. We feel it is time for reassessment and she should qualify for a home unattended sleep study. Her biggest sleep complaint has always been difficulty initiating and maintaining sleep, mostly with difficulty relaxing. I suggested formal referral for a trial at [facility]. She is playing tennis and walking regularly with no acute problems.  01/11/12- 68 yoF never smoker followed here for allergic rhinitis/ conjunctivitis, and for insomnia with obstructive sleep apnea/ failed  CPAP, complicated by history of migraine, hypertension, GERD. Her primary concern continues to be insomnia with difficulty initiating and maintaining sleep. She tried a wide Friday of interventions for sleep apnea but none improved her quality of life or ability to sleep well at night. 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 This creates additional stress, as do significant health problems in her family. We have discussed the impact on her sleep quality. She expresses intent to follow through with the cognitive behavioral therapy referral we had made. Allergy symptoms are generally controlled with OTC antihistamines now spring was the worst time of year.  03/12/12-  9 yoF never smoker followed here for allergic rhinitis/ conjunctivitis, and for insomnia with obstructive sleep apnea/ failed CPAP, complicated by history of migraine, hypertension, GERD. Still not sleeping well and would  like to discuss her sleep med. Primary complaint is difficulty initiating and maintaining sleep. We have repeatedly reviewed sleep hygiene. Several medications tried as noted in record. She describes 2 hour sleep latency with Xanax 0.5 mg. Sleep soundly after she finally falls asleep. Lives alone. Stressful, rather political job with [employer].  04/21/12- 51 yoF never smoker followed here for allergic rhinitis/ conjunctivitis, and for insomnia with obstructive sleep apnea/ failed CPAP, complicated by history of migraine, hypertension, GERD. ACUTE VISIT: started last week with GI upset, sore throat on Wenesday/Thursday, lost voice, cough-productive, pressure/pain in Right ear. Denies any fever but has had chills. Little loose phlegm, cough is mostly dry.. Taking supplemental vitamin D. Strep smear negative at her primary physician's office. Some chilling but no fever. GI now okay.  05/12/12  66 yoF never smoker followed here for allergic rhinitis/ conjunctivitis, and  for insomnia with obstructive sleep apnea/ failed CPAP and Provent, complicated by history of migraine, hypertension, GERD. FOLLOWS FOR: occasional dry cough; denies any wheezing or SOB.  She started a Z-Pak yesterday we'll recent rhonchi does without fever. Did have flu shot. She asks Diflucan available because of antibiotic/yeast. We discussed insomnia management again. She is alone and not aware of snoring. I mentioned a book I had found which might help.  09/25/12-66 yoF never smoker followed here for allergic rhinitis/ conjunctivitis, and for insomnia with obstructive sleep apnea/ failed CPAP and Provent, complicated by history of migraine, hypertension, GERD. FOLLOWS FOR: watery eyes, slight cough, feels stuffy from pollen Chronic insomnia pattern is not really changed. She has been under some stress recently related to her job for the [employer]  12/03/12- 66 yoF never smoker followed here for allergic rhinitis/ conjunctivitis, and for insomnia with obstructive sleep apnea/ failed CPAP and Provent, complicated by history of migraine, hypertension, GERD. FOLLOWS FOR: having stuffiness at times; other wise no cough, SOB, wheezing, or congestion. Chronic insomnia pattern is up and down but persistent. Clonazepam 0.5 mg has help when used occasionally. Sleep apnea status is uncertain but not obtrusive. She lives alone. Treatments all interfered with insomnia more than they helped. Occasional rhinitis, mostly treated OTC  02/06/13- 66 yoF never smoker followed here for allergic rhinitis/ conjunctivitis, and for insomnia with obstructive sleep apnea/ failed CPAP and Provent, complicated by history of migraine, hypertension, GERD. FOLLOWS FOR: denies any wheezing, SOB, cough, or congestion. Still having leg jerks and just not sleeping She lives alone and admits she"doesn't like night time and doesn't want to go to sleep". We discussed her experience with meds and she is willing to try Xanax  plus Amitriptyline at bedtime.  04/10/13- 66 yoF never smoker followed here for allergic rhinitis/ conjunctivitis, and for insomnia with obstructive sleep apnea/ failed CPAP and Provent, complicated by history of migraine, hypertension, GERD FOLLOWS FOR:  Breathing doing well.  Still having trouble sleeping ,4 hours per night.  Not using CPAP. She did not follow through with the cognitive behavioral therapy or oral appliance suggestions. We discussed medications and her lifestyle. She lives alone.  06/10/13- 66 yoF never smoker followed here for allergic rhinitis/ conjunctivitis, and for insomnia with obstructive sleep apnea/ failed CPAP and Provent, complicated by history of migraine, hypertension, GERD FOLLOWS FOR: sleeps for about 4.5 hours each night; still not using CPAP -can not stand it. She has tried CPAP and BiPAP, been educated on oral appliances and the importance of maintaining normal body weight. Her primary sleep complaint is insomnia and sleep apnea  interventions have made that worse. She feels her sleep status is stable and she lives with it. Educated again on sleep hygiene. Bothered recently by watery rhinorrhea. Discussed antihistamines. Occasional soreness inside nostrils from wiping her nose, using Neosporin.  Review of Systems-see HPI Constitutional:   No-   weight loss, night sweats, fevers, chills, fatigue, lassitude. + insomnia HEENT:   No-  headaches, difficulty swallowing, tooth/dental problems, no-sore throat,       No- sneezing, itching, no-ears itch,no-nasal congestion, post nasal drip,  CV:  No-   chest pain, orthopnea, PND, swelling in lower extremities, anasarca, dizziness, palpitations Resp: No-   shortness of breath with exertion or at rest.             No- productive cough,  No- non-productive cough,  No-  coughing up of blood.              No-   change in color of mucus.  No- wheezing.   Skin: No-   rash or lesions. GI:  No-   heartburn, indigestion, abdominal  pain, nausea, vomiting,  GU: MS:  No-   joint pain or swelling.   Neuro- :  Psych:  No- change in mood or affect. Some- depression or anxiety.  No memory loss.   Physical Exam  General- Alert, Oriented, Affect-appropriate, very pleasant, Distress- none acute, overweight Skin- rash-none, lesions- none, excoriation- none Lymphadenopathy- none Head- atraumatic            Eyes- Gross vision intact, PERRLA, conjunctivae clear secretions            Ears- Hearing, canals normal            Nose- +mild turbinate edema, No- sores,  no -Septal dev, mucus, polyps, erosion, perforation.             Throat- Mallampati III , mucosa clear-not read , drainage- none, tonsils- atrophic   Neck- flexible , trachea midline, no stridor , thyroid nl, carotid no bruit Chest - symmetrical excursion , unlabored           Heart/CV- RRR , no murmur , no gallop  , no rub, nl s1 s2                           - JVD- none , edema- none, stasis changes- none, varices- none           Lung- clear to P&A, wheeze- none, no-cough  , dullness-none, rub- none           Chest wall-  Abd- Br/ Gen/ Rectal- Not done, not indicated Extrem- cyanosis- none, clubbing, none, atrophy- none, strength- nl Neuro- grossly intact to observation              Patient ID: Teresa Molina, female    DOB: 1946/03/09, 68 y.o.   MRN: [id number]  HPI - 5yoF followed here for allergic rhinitis/ conjunctivitis, and for insomnia with obstructive sleep apnea.  She has not tolerated CPAP well and has been educated on alternatives. Currently she has a trial set of Provent nasal valves and is still very tentative, but trying. Her tendency is toward "busy-brain" insomnia with difficulty initiating sleep. She is also interested in trying a Silent Night oral appliance offered by her dentist.  She has not been able to lose weight.  For seasonal allergic irritation of eyes, watering and itching, she asks sample Pataday.  10/11/10- OSA, insomnia,  allergic rhinitis Provent  didn't work out well and she still felt she couldn't sleep throught the night. She is going to look into an oral appliance with her dentist.  Weight loss and sleep hygiene goals reviewed. Spring associated nasal congestion and watery eyes are getting better.  12/13/10- [name] followed here for allergic rhinitis/ conjunctivitis, and for insomnia with obstructive sleep apnea, complicated by history of migraine, hypertension, GERD. She follows with Dr. Rex Kras for blood pressure management. He is evaluating recent description of aura with scheduled carotid Dopplers. Her primary sleep complaint remains difficulty initiating and maintaining sleep/insomnia. Obstructive sleep apnea has been identified and we have worked on it persistently. Unfortunately she has not tolerated CPAP or Provent nasal valves, and she has not been able to lose weight. Her dentist is interested in trying to make an oral appliance but apparently has limited experience with this. I have offered to send her to someone who has done a lot more of this kind of therapy. Restless legs have been identified in the past but does not seem to be a major clinical problem. Allergic rhinitis and conjunctivitis are managed symptomatically. Most recently she had Pataday.  02/21/11- [name] followed here for allergic rhinitis/ conjunctivitis, and for insomnia with obstructive sleep apnea, complicated by history of migraine, hypertension, GERD. Since last here in August she has had no more paresthesias. She has given up on CPAP and Provent. nasal valves. Insomnia remains her primary sleep complaint. We have discussed this repeatedly and tried a number of approaches. Basic sleep hygiene is good. Fortunately workup for abnormal breast exam turned out benign. In the last 2-3 weeks she feels fall seasonal allergies bothering her. Eyes itch, water. Ears itch. She has a sore inside her right nostril from blowing her nose a lot. Clear  mucus discharge. Denies chest tightness or wheeze.  05/02/11- [name] followed here for allergic rhinitis/ conjunctivitis, and for insomnia with obstructive sleep apnea, complicated by history of migraine, hypertension, GERD. Using clonazepam when needed for her insomnia. Failed to tolerate CPAP with multiple efforts, and the Provent nasal valves. Unable to lose weight. Lives alone with no reports of snoring or apnea. Did not get a dental oral device. Eyes have watered some, with little itch, sneeze or nasal congestion. Denies wheeze or cough.   07/04/11- [name] followed here for allergic rhinitis/ conjunctivitis, and for insomnia with obstructive sleep apnea/ failed CPAP, complicated by history of migraine, hypertension, GERD. Insomnia remains her primary sleep complaint. She is alone with no reports of snoring, witnessed apnea, or sleep disturbance by limb movements. We have discussed available treatments again. Seasonal rhinitis complaints are not yet the problem. We have reviewed symptomatic therapy is available if needed later in the spring. She is anticipating surgery for breast lump but does not know details yet. We do not anticipate any respiratory concerns that would affect that surgery.  09/05/11- [name] followed here for allergic rhinitis/ conjunctivitis, and for insomnia with obstructive sleep apnea/ failed CPAP, complicated by history of migraine, hypertension, GERD. Breast lump was a benign lipoma but she is not sure was adequately excised. Intermittent antihistamine. Never really sleeps well or without interruption. Pattern has not changed.  11/07/11- [name] followed here for allergic rhinitis/ conjunctivitis, and for insomnia with obstructive sleep apnea/ failed CPAP, complicated by history of migraine, hypertension, GERD. Pt still not using cpap and would like something new. We talked again about available treatments including oral appliances, nasal valves. We feel it is time for  reassessment and she should qualify for a  home unattended sleep study. Her biggest sleep complaint has always been difficulty initiating and maintaining sleep, mostly with difficulty relaxing. I suggested formal referral for a trial at [facility]. She is playing tennis and walking regularly with no acute problems.  01/11/12- 68 yoF never smoker followed here for allergic rhinitis/ conjunctivitis, and for insomnia with obstructive sleep apnea/ failed CPAP, complicated by history of migraine, hypertension, GERD. Her primary concern continues to be insomnia with difficulty initiating and maintaining sleep. She tried a wide Friday of interventions for sleep apnea but none improved her quality of life or ability to sleep well at night. 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 This creates additional stress, as do significant health problems in her family. We have discussed the impact on her sleep quality. She expresses intent to follow through with the cognitive behavioral therapy referral we had made. Allergy symptoms are generally controlled with OTC antihistamines now spring was the worst time of year.  03/12/12-  74 yoF never smoker followed here for allergic rhinitis/ conjunctivitis, and for insomnia with obstructive sleep apnea/ failed CPAP, complicated by history of migraine, hypertension, GERD. Still not sleeping well and would like to discuss her sleep med. Primary complaint is difficulty initiating and maintaining sleep. We have repeatedly reviewed sleep hygiene. Several medications tried as noted in record. She describes 2 hour sleep latency with Xanax 0.5 mg. Sleep soundly after she finally falls asleep. Lives alone. Stressful, rather political job with [employer].  Review of Systems-see HPI Constitutional:   No-   weight loss, night sweats, fevers, chills, fatigue, lassitude. + insomnia HEENT:   No-  headaches, difficulty  swallowing, tooth/dental problems, sore throat,       No sneezing, itching, ears itch, nasal congestion, post nasal drip,  CV:  No-   chest pain, orthopnea, PND, swelling in lower extremities, anasarca, dizziness, palpitations Resp: No-   shortness of breath with exertion or at rest.              No-   productive cough,  No non-productive cough,  No-  coughing up of blood.              No-   change in color of mucus.  No- wheezing.   Skin: No-   rash or lesions. GI:  No-   heartburn, indigestion, abdominal pain, nausea, vomiting,  GU: MS:  No-   joint pain or swelling.   Neuro- :  Psych:  No- change in mood or affect. Some- depression or anxiety.  No memory loss.   Physical Exam  General- Alert, Oriented, Affect-appropriate, very pleasant, Distress- none acute, overweight Skin- rash-none, lesions- none, excoriation- none Lymphadenopathy- none Head- atraumatic            Eyes- Gross vision intact, PERRLA, conjunctivae clear secretions            Ears- Hearing, canals normal            Nose- , no -Septal dev, mucus, polyps, erosion, perforation. No turbinate edema or mucus bridging.            Throat- Mallampati III , mucosa clear , drainage- none, tonsils- atrophic Neck- flexible , trachea midline, no stridor , thyroid nl, carotid no bruit Chest - symmetrical excursion , unlabored           Heart/CV- RRR , no murmur , no gallop  , no rub, nl s1 s2                           -  JVD- none , edema- none, stasis changes- none, varices- none           Lung- clear to P&A, wheeze- none, cough- none , dullness-none, rub- none           Chest wall-  Abd- Br/ Gen/ Rectal- Not done, not indicated Extrem- cyanosis- none, clubbing, none, atrophy- none, strength- nl Neuro- grossly intact to observation              Patient ID: Teresa Molina, female    DOB: 1945/11/18, 68 y.o.   MRN: [id number]  HPI - 86yoF followed here for allergic rhinitis/ conjunctivitis, and for insomnia with  obstructive sleep apnea.  She has not tolerated CPAP well and has been educated on alternatives. Currently she has a trial set of Provent nasal valves and is still very tentative, but trying. Her tendency is toward "busy-brain" insomnia with difficulty initiating sleep. She is also interested in trying a Silent Night oral appliance offered by her dentist.  She has not been able to lose weight.  For seasonal allergic irritation of eyes, watering and itching, she asks sample Pataday.  10/11/10- OSA, insomnia, allergic rhinitis Provent didn't work out well and she still felt she couldn't sleep throught the night. She is going to look into an oral appliance with her dentist.  Weight loss and sleep hygiene goals reviewed. Spring associated nasal congestion and watery eyes are getting better.  12/13/10- [name] followed here for allergic rhinitis/ conjunctivitis, and for insomnia with obstructive sleep apnea, complicated by history of migraine, hypertension, GERD. She follows with Dr. Rex Kras for blood pressure management. He is evaluating recent description of aura with scheduled carotid Dopplers. Her primary sleep complaint remains difficulty initiating and maintaining sleep/insomnia. Obstructive sleep apnea has been identified and we have worked on it persistently. Unfortunately she has not tolerated CPAP or Provent nasal valves, and she has not been able to lose weight. Her dentist is interested in trying to make an oral appliance but apparently has limited experience with this. I have offered to send her to someone who has done a lot more of this kind of therapy. Restless legs have been identified in the past but does not seem to be a major clinical problem. Allergic rhinitis and conjunctivitis are managed symptomatically. Most recently she had Pataday.  02/21/11- [name] followed here for allergic rhinitis/ conjunctivitis, and for insomnia with obstructive sleep apnea, complicated by history of migraine,  hypertension, GERD. Since last here in August she has had no more paresthesias. She has given up on CPAP and Provent. nasal valves. Insomnia remains her primary sleep complaint. We have discussed this repeatedly and tried a number of approaches. Basic sleep hygiene is good. Fortunately workup for abnormal breast exam turned out benign. In the last 2-3 weeks she feels fall seasonal allergies bothering her. Eyes itch, water. Ears itch. She has a sore inside her right nostril from blowing her nose a lot. Clear mucus discharge. Denies chest tightness or wheeze.  05/02/11- [name] followed here for allergic rhinitis/ conjunctivitis, and for insomnia with obstructive sleep apnea, complicated by history of migraine, hypertension, GERD. Using clonazepam when needed for her insomnia. Failed to tolerate CPAP with multiple efforts, and the Provent nasal valves. Unable to lose weight. Lives alone with no reports of snoring or apnea. Did not get a dental oral device. Eyes have watered some, with little itch, sneeze or nasal congestion. Denies wheeze or cough.   07/04/11- [name] followed here for allergic rhinitis/ conjunctivitis, and for  insomnia with obstructive sleep apnea/ failed CPAP, complicated by history of migraine, hypertension, GERD. Insomnia remains her primary sleep complaint. She is alone with no reports of snoring, witnessed apnea, or sleep disturbance by limb movements. We have discussed available treatments again. Seasonal rhinitis complaints are not yet the problem. We have reviewed symptomatic therapy is available if needed later in the spring. She is anticipating surgery for breast lump but does not know details yet. We do not anticipate any respiratory concerns that would affect that surgery.  09/05/11- [name] followed here for allergic rhinitis/ conjunctivitis, and for insomnia with obstructive sleep apnea/ failed CPAP, complicated by history of migraine, hypertension, GERD. Breast lump was a  benign lipoma but she is not sure was adequately excised. Intermittent antihistamine. Never really sleeps well or without interruption. Pattern has not changed.  11/07/11- [name] followed here for allergic rhinitis/ conjunctivitis, and for insomnia with obstructive sleep apnea/ failed CPAP, complicated by history of migraine, hypertension, GERD. Pt still not using cpap and would like something new. We talked again about available treatments including oral appliances, nasal valves. We feel it is time for reassessment and she should qualify for a home unattended sleep study. Her biggest sleep complaint has always been difficulty initiating and maintaining sleep, mostly with difficulty relaxing. I suggested formal referral for a trial at [facility]. She is playing tennis and walking regularly with no acute problems.  01/11/12- 68 yoF never smoker followed here for allergic rhinitis/ conjunctivitis, and for insomnia with obstructive sleep apnea/ failed CPAP, complicated by history of migraine, hypertension, GERD. Her primary concern continues to be insomnia with difficulty initiating and maintaining sleep. She tried a wide Friday of interventions for sleep apnea but none improved her quality of life or ability to sleep well at night. 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 This creates additional stress, as do significant health problems in her family. We have discussed the impact on her sleep quality. She expresses intent to follow through with the cognitive behavioral therapy referral we had made. Allergy symptoms are generally controlled with OTC antihistamines now spring was the worst time of year.  03/12/12-  69 yoF never smoker followed here for allergic rhinitis/ conjunctivitis, and for insomnia with obstructive sleep apnea/ failed CPAP, complicated by history of migraine, hypertension, GERD. Still not sleeping well and would like to discuss  her sleep med. Primary complaint is difficulty initiating and maintaining sleep. We have repeatedly reviewed sleep hygiene. Several medications tried as noted in record. She describes 2 hour sleep latency with Xanax 0.5 mg. Sleep soundly after she finally falls asleep. Lives alone. Stressful, rather political job with [employer].  04/21/12- 51 yoF never smoker followed here for allergic rhinitis/ conjunctivitis, and for insomnia with obstructive sleep apnea/ failed CPAP, complicated by history of migraine, hypertension, GERD. ACUTE VISIT: started last week with GI upset, sore throat on Wenesday/Thursday, lost voice, cough-productive, pressure/pain in Right ear. Denies any fever but has had chills. Little loose phlegm, cough is mostly dry.. Taking supplemental vitamin D. Strep smear negative at her primary physician's office. Some chilling but no fever. GI now okay.  05/12/12  66 yoF never smoker followed here for allergic rhinitis/ conjunctivitis, and for insomnia with obstructive sleep apnea/ failed CPAP and Provent, complicated by history of migraine, hypertension, GERD. FOLLOWS FOR: occasional dry cough; denies any wheezing or SOB.  She started a Z-Pak yesterday we'll recent rhonchi does without fever. Did have  flu shot. She asks Diflucan available because of antibiotic/yeast. We discussed insomnia management again. She is alone and not aware of snoring. I mentioned a book I had found which might help.  09/25/12-66 yoF never smoker followed here for allergic rhinitis/ conjunctivitis, and for insomnia with obstructive sleep apnea/ failed CPAP and Provent, complicated by history of migraine, hypertension, GERD. FOLLOWS FOR: watery eyes, slight cough, feels stuffy from pollen Chronic insomnia pattern is not really changed. She has been under some stress recently related to her job for the [employer]  12/03/12- 66 yoF never smoker followed here for allergic rhinitis/  conjunctivitis, and for insomnia with obstructive sleep apnea/ failed CPAP and Provent, complicated by history of migraine, hypertension, GERD. FOLLOWS FOR: having stuffiness at times; other wise no cough, SOB, wheezing, or congestion. Chronic insomnia pattern is up and down but persistent. Clonazepam 0.5 mg has help when used occasionally. Sleep apnea status is uncertain but not obtrusive. She lives alone. Treatments all interfered with insomnia more than they helped. Occasional rhinitis, mostly treated OTC  02/06/13- 66 yoF never smoker followed here for allergic rhinitis/ conjunctivitis, and for insomnia with obstructive sleep apnea/ failed CPAP and Provent, complicated by history of migraine, hypertension, GERD. FOLLOWS FOR: denies any wheezing, SOB, cough, or congestion. Still having leg jerks and just not sleeping She lives alone and admits she"doesn't like night time and doesn't want to go to sleep". We discussed her experience with meds and she is willing to try Xanax plus Amitriptyline at bedtime.  04/10/13- 66 yoF never smoker followed here for allergic rhinitis/ conjunctivitis, and for insomnia with obstructive sleep apnea/ failed CPAP and Provent, complicated by history of migraine, hypertension, GERD FOLLOWS FOR:  Breathing doing well.  Still having trouble sleeping ,4 hours per night.  Not using CPAP. She did not follow through with the cognitive behavioral therapy or oral appliance suggestions. We discussed medications and her lifestyle. She lives alone.  06/10/13- 66 yoF never smoker followed here for allergic rhinitis/ conjunctivitis, and for insomnia with obstructive sleep apnea/ failed CPAP and Provent, complicated by history of migraine, hypertension, GERD FOLLOWS FOR: sleeps for about 4.5 hours each night; still not using CPAP -can not stand it. She has tried CPAP and BiPAP, been educated on oral appliances and the importance of maintaining normal body weight. Her primary sleep  complaint is insomnia and sleep apnea interventions have made that worse. She feels her sleep status is stable and she lives with it. Educated again on sleep hygiene. Bothered recently by watery rhinorrhea. Discussed antihistamines. Occasional soreness inside nostrils from wiping her nose, using Neosporin.  09/10/13- 67 yoF never smoker followed here for allergic rhinitis/ conjunctivitis, and for insomnia with obstructive sleep apnea/ failed CPAP and Provent, complicated by history of migraine, hypertension, GERD Minor seasonal stuffiness, no infection or chest discomfort. Antihistamines if needed. Sleep- bedtime 1:30-6:30 or 7:00AM. Admits tired. Discussed sleep hygiene.   12/31/13- 67 yoF never smoker followed here for allergic rhinitis/ conjunctivitis, and for insomnia with obstructive sleep apnea/ failed CPAP and Provent, complicated by history of migraine, hypertension, GERD   03/03/14-  67 yoF never smoker followed here for allergic rhinitis/ conjunctivitis, and for insomnia with obstructive sleep apnea/ failed CPAP and Provent, complicated by history of migraine, hypertension, GERD FOLLOWS FOR: Patient still not wearing CPAP. She has cough for past 2 weeks. She is having greenish phlegm production sometime.     Review of Systems-see HPI Constitutional:   No-   weight loss, night  sweats, fevers, chills, fatigue, lassitude. + insomnia HEENT:   No-  headaches, difficulty swallowing, tooth/dental problems, no-sore throat,       No- sneezing, itching, no-ears itch,+nasal congestion, post nasal drip,  CV:  No-   chest pain, orthopnea, PND, swelling in lower extremities, anasarca, dizziness, palpitations Resp: No-   shortness of breath with exertion or at rest.             No- productive cough,  No- non-productive cough,  No-  coughing up of blood.              No-   change in color of mucus.  No- wheezing.   Skin: No-   rash or lesions. GI:  No-   heartburn, indigestion, abdominal pain,  nausea, vomiting,  GU: MS:  No-   joint pain or swelling.   Neuro- :  Psych:  No- change in mood or affect. Some- depression or anxiety.  No memory loss.   Physical Exam  General- Alert, Oriented, Affect-appropriate, very pleasant, Distress- none acute, overweight Skin- rash-none, lesions- none, excoriation- none Lymphadenopathy- none Head- atraumatic            Eyes- Gross vision intact, PERRLA, conjunctivae clear secretions            Ears- Hearing, canals normal            Nose- +mild turbinate edema, No- sores,  no -Septal dev, mucus, polyps, erosion, perforation.             Throat- Mallampati III , mucosa clear-not read , drainage- none, tonsils- atrophic   Neck- flexible , trachea midline, no stridor , thyroid nl, carotid no bruit Chest - symmetrical excursion , unlabored           Heart/CV- RRR , no murmur , no gallop  , no rub, nl s1 s2                           - JVD- none , edema- none, stasis changes- none, varices- none           Lung- clear to P&A, wheeze- none, no-cough  , dullness-none, rub- none           Chest wall-  Abd- Br/ Gen/ Rectal- Not done, not indicated Extrem- cyanosis- none, clubbing, none, atrophy- none, strength- nl Neuro- grossly intact to observation

## 2014-03-15 ENCOUNTER — Encounter: Payer: Self-pay | Admitting: Internal Medicine

## 2014-04-05 ENCOUNTER — Encounter: Payer: Self-pay | Admitting: Gynecology

## 2014-04-05 ENCOUNTER — Encounter: Payer: Medicare Other | Admitting: Gynecology

## 2014-04-05 ENCOUNTER — Ambulatory Visit (INDEPENDENT_AMBULATORY_CARE_PROVIDER_SITE_OTHER): Payer: Medicare Other | Admitting: Gynecology

## 2014-04-05 ENCOUNTER — Other Ambulatory Visit (HOSPITAL_COMMUNITY)
Admission: RE | Admit: 2014-04-05 | Discharge: 2014-04-05 | Disposition: A | Discharge status: Home / Self Care | Payer: Medicare Other | Source: Ambulatory Visit | Attending: Gynecology | Admitting: Gynecology

## 2014-04-05 VITALS — BP 118/80 | Ht 63.0 in | Wt 176.0 lb

## 2014-04-05 DIAGNOSIS — N952 Postmenopausal atrophic vaginitis: Secondary | ICD-10-CM

## 2014-04-05 DIAGNOSIS — N951 Menopausal and female climacteric states: Secondary | ICD-10-CM

## 2014-04-05 DIAGNOSIS — Z124 Encounter for screening for malignant neoplasm of cervix: Secondary | ICD-10-CM | POA: Diagnosis present

## 2014-04-05 NOTE — Patient Instructions (Signed)
You may obtain a copy of any labs that were done today by logging onto MyChart as outlined in the instructions provided with your AVS (after visit summary). The office will not call with normal lab results but certainly if there are any significant abnormalities then we will contact you.   Health Maintenance, Female A healthy lifestyle and preventative care can promote health and wellness.  Maintain regular health, dental, and eye exams.  Eat a healthy diet. Foods like vegetables, fruits, whole grains, low-fat dairy products, and lean protein foods contain the nutrients you need without too many calories. Decrease your intake of foods high in solid fats, added sugars, and salt. Get information about a proper diet from your caregiver, if necessary.  Regular physical exercise is one of the most important things you can do for your health. Most adults should get at least 150 minutes of moderate-intensity exercise (any activity that increases your heart rate and causes you to sweat) each week. In addition, most adults need muscle-strengthening exercises on 2 or more days a week.   Maintain a healthy weight. The body mass index (BMI) is a screening tool to identify possible weight problems. It provides an estimate of body fat based on height and weight. Your caregiver can help determine your BMI, and can help you achieve or maintain a healthy weight. For adults 20 years and older:  A BMI below 18.5 is considered underweight.  A BMI of 18.5 to 24.9 is normal.  A BMI of 25 to 29.9 is considered overweight.  A BMI of 30 and above is considered obese.  Maintain normal blood lipids and cholesterol by exercising and minimizing your intake of saturated fat. Eat a balanced diet with plenty of fruits and vegetables. Blood tests for lipids and cholesterol should begin at age 61 and be repeated every 5 years. If your lipid or cholesterol levels are high, you are over 50, or you are a high risk for heart  disease, you may need your cholesterol levels checked more frequently.Ongoing high lipid and cholesterol levels should be treated with medicines if diet and exercise are not effective.  If you smoke, find out from your caregiver how to quit. If you do not use tobacco, do not start.  Lung cancer screening is recommended for adults aged 33 80 years who are at high risk for developing lung cancer because of a history of smoking. Yearly low-dose computed tomography (CT) is recommended for people who have at least a 30-pack-year history of smoking and are a current smoker or have quit within the past 15 years. A pack year of smoking is smoking an average of 1 pack of cigarettes a day for 1 year (for example: 1 pack a day for 30 years or 2 packs a day for 15 years). Yearly screening should continue until the smoker has stopped smoking for at least 15 years. Yearly screening should also be stopped for people who develop a health problem that would prevent them from having lung cancer treatment.  If you are pregnant, do not drink alcohol. If you are breastfeeding, be very cautious about drinking alcohol. If you are not pregnant and choose to drink alcohol, do not exceed 1 drink per day. One drink is considered to be 12 ounces (355 mL) of beer, 5 ounces (148 mL) of wine, or 1.5 ounces (44 mL) of liquor.  Avoid use of street drugs. Do not share needles with anyone. Ask for help if you need support or instructions about stopping  the use of drugs.  High blood pressure causes heart disease and increases the risk of stroke. Blood pressure should be checked at least every 1 to 2 years. Ongoing high blood pressure should be treated with medicines, if weight loss and exercise are not effective.  If you are 59 to 68 years old, ask your caregiver if you should take aspirin to prevent strokes.  Diabetes screening involves taking a blood sample to check your fasting blood sugar level. This should be done once every 3  years, after age 91, if you are within normal weight and without risk factors for diabetes. Testing should be considered at a younger age or be carried out more frequently if you are overweight and have at least 1 risk factor for diabetes.  Breast cancer screening is essential preventative care for women. You should practice "breast self-awareness." This means understanding the normal appearance and feel of your breasts and may include breast self-examination. Any changes detected, no matter how small, should be reported to a caregiver. Women in their 66s and 30s should have a clinical breast exam (CBE) by a caregiver as part of a regular health exam every 1 to 3 years. After age 101, women should have a CBE every year. Starting at age 100, women should consider having a mammogram (breast X-ray) every year. Women who have a family history of breast cancer should talk to their caregiver about genetic screening. Women at a high risk of breast cancer should talk to their caregiver about having an MRI and a mammogram every year.  Breast cancer gene (BRCA)-related cancer risk assessment is recommended for women who have family members with BRCA-related cancers. BRCA-related cancers include breast, ovarian, tubal, and peritoneal cancers. Having family members with these cancers may be associated with an increased risk for harmful changes (mutations) in the breast cancer genes BRCA1 and BRCA2. Results of the assessment will determine the need for genetic counseling and BRCA1 and BRCA2 testing.  The Pap test is a screening test for cervical cancer. Women should have a Pap test starting at age 57. Between ages 25 and 35, Pap tests should be repeated every 2 years. Beginning at age 37, you should have a Pap test every 3 years as long as the past 3 Pap tests have been normal. If you had a hysterectomy for a problem that was not cancer or a condition that could lead to cancer, then you no longer need Pap tests. If you are  between ages 50 and 76, and you have had normal Pap tests going back 10 years, you no longer need Pap tests. If you have had past treatment for cervical cancer or a condition that could lead to cancer, you need Pap tests and screening for cancer for at least 20 years after your treatment. If Pap tests have been discontinued, risk factors (such as a new sexual partner) need to be reassessed to determine if screening should be resumed. Some women have medical problems that increase the chance of getting cervical cancer. In these cases, your caregiver may recommend more frequent screening and Pap tests.  The human papillomavirus (HPV) test is an additional test that may be used for cervical cancer screening. The HPV test looks for the virus that can cause the cell changes on the cervix. The cells collected during the Pap test can be tested for HPV. The HPV test could be used to screen women aged 44 years and older, and should be used in women of any age  who have unclear Pap test results. After the age of 55, women should have HPV testing at the same frequency as a Pap test.  Colorectal cancer can be detected and often prevented. Most routine colorectal cancer screening begins at the age of 44 and continues through age 20. However, your caregiver may recommend screening at an earlier age if you have risk factors for colon cancer. On a yearly basis, your caregiver may provide home test kits to check for hidden blood in the stool. Use of a small camera at the end of a tube, to directly examine the colon (sigmoidoscopy or colonoscopy), can detect the earliest forms of colorectal cancer. Talk to your caregiver about this at age 86, when routine screening begins. Direct examination of the colon should be repeated every 5 to 10 years through age 13, unless early forms of pre-cancerous polyps or small growths are found.  Hepatitis C blood testing is recommended for all people born from [birth year] through 1965 and any  individual with known risks for hepatitis C.  Practice safe sex. Use condoms and avoid high-risk sexual practices to reduce the spread of sexually transmitted infections (STIs). Sexually active women aged 36 and younger should be checked for Chlamydia, which is a common sexually transmitted infection. Older women with new or multiple partners should also be tested for Chlamydia. Testing for other STIs is recommended if you are sexually active and at increased risk.  Osteoporosis is a disease in which the bones lose minerals and strength with aging. This can result in serious bone fractures. The risk of osteoporosis can be identified using a bone density scan. Women ages 20 and over and women at risk for fractures or osteoporosis should discuss screening with their caregivers. Ask your caregiver whether you should be taking a calcium supplement or vitamin D to reduce the rate of osteoporosis.  Menopause can be associated with physical symptoms and risks. Hormone replacement therapy is available to decrease symptoms and risks. You should talk to your caregiver about whether hormone replacement therapy is right for you.  Use sunscreen. Apply sunscreen liberally and repeatedly throughout the day. You should seek shade when your shadow is shorter than you. Protect yourself by wearing long sleeves, pants, a wide-brimmed hat, and sunglasses year round, whenever you are outdoors.  Notify your caregiver of new moles or changes in moles, especially if there is a change in shape or color. Also notify your caregiver if a mole is larger than the size of a pencil eraser.  Stay current with your immunizations. Document Released: 11/13/2010 Document Revised: 08/25/2012 Document Reviewed: 11/13/2010 [facility] Patient Information 2014 [employer].

## 2014-04-05 NOTE — Addendum Note (Signed)
Addended by: Burnett Kanaris on: 04/05/2014 04:04 PM   Modules accepted: Orders, SmartSet

## 2014-04-05 NOTE — Progress Notes (Signed)
SHAKIAH WESTER 01-Jul-1945 [id number]        68 y.o.  G2P2002 follow up exam.  Several issues noted below.  Past medical history,surgical history, problem list, medications, allergies, family history and social history were all reviewed and documented as reviewed in the EPIC chart.  ROS:  12 system ROS performed with pertinent positives and negatives included in the history, assessment and plan.   Additional significant findings :  none   Exam: Leanne Lovely Vitals:   04/05/14 1526  BP: 118/80  Height: 5\' 3"  (1.6 m)  Weight: 176 lb (79.833 kg)   General appearance:  Normal affect, orientation and appearance. Skin: Grossly normal HEENT: Without gross lesions.  No cervical or supraclavicular adenopathy. Thyroid normal.  Lungs:  Clear without wheezing, rales or rhonchi Cardiac: RR, without RMG Abdominal:  Soft, nontender, without masses, guarding, rebound, organomegaly or hernia Breasts:  Examined lying and sitting without masses, retractions, discharge or axillary adenopathy. Pelvic:  Ext/BUS/vagina with generalized atrophic changes. Pap of cuff done  Adnexa  Without masses or tenderness    Anus and perineum  Normal   Rectovaginal  Normal sphincter tone without palpated masses or tenderness.    Assessment/Plan:  68 y.o. [id number] female for follow up exam.   1. Postmenopausal/atrophic genital changes/menopausal symptoms. status post TAH in 1978.  Patient is off of her HRT and overall doing well. She does have some hot flashes and night sweats but they are manageable and she does not want to reinitiate treatment.  Continue to monitor. Follow up if this becomes an issue. 2. Pap smear 2012. Pap smear of vaginal cuff today. No history of significant abnormal Pap smears. Status post hysterectomy for benign indications. Over the age of 50. Options to stop screening reviewed. Will readdress on annual basis. 3. DEXA 2013 normal. Repeat at age 53 which will be a 5 year interval.  Increased calcium vitamin D reviewed. 4. Mammography 02/2014. Continue with annual mammography. SBE monthly reviewed. 5. Colonoscopy 2014. Repeat at their recommended interval. 6. Health maintenance. No routine blood work done as this is done at her primary physician's office. Follow up 1 year, sooner as needed     [name] MD, 3:45 PM 04/05/2014

## 2014-04-06 LAB — URINALYSIS W MICROSCOPIC + REFLEX CULTURE
Bilirubin Urine: NEGATIVE
CRYSTALS: NONE SEEN
Casts: NONE SEEN
GLUCOSE, UA: NEGATIVE mg/dL
Hgb urine dipstick: NEGATIVE
Ketones, ur: NEGATIVE mg/dL
Leukocytes, UA: NEGATIVE
Nitrite: NEGATIVE
PH: 7 (ref 5.0–8.0)
Protein, ur: NEGATIVE mg/dL
SPECIFIC GRAVITY, URINE: 1.017 (ref 1.005–1.030)
Urobilinogen, UA: 1 mg/dL (ref 0.0–1.0)

## 2014-04-09 LAB — CYTOLOGY - PAP

## 2014-05-06 ENCOUNTER — Encounter: Payer: Self-pay | Admitting: Internal Medicine

## 2014-05-06 ENCOUNTER — Ambulatory Visit (INDEPENDENT_AMBULATORY_CARE_PROVIDER_SITE_OTHER): Payer: Medicare Other | Admitting: Internal Medicine

## 2014-05-06 VITALS — BP 120/76 | HR 71 | Ht 62.0 in | Wt 177.6 lb

## 2014-05-06 DIAGNOSIS — J3089 Other allergic rhinitis: Secondary | ICD-10-CM

## 2014-05-06 DIAGNOSIS — J209 Acute bronchitis, unspecified: Secondary | ICD-10-CM

## 2014-05-06 DIAGNOSIS — J309 Allergic rhinitis, unspecified: Secondary | ICD-10-CM

## 2014-05-06 DIAGNOSIS — G4700 Insomnia, unspecified: Secondary | ICD-10-CM

## 2014-05-06 DIAGNOSIS — G4730 Sleep apnea, unspecified: Secondary | ICD-10-CM

## 2014-05-06 DIAGNOSIS — J302 Other seasonal allergic rhinitis: Secondary | ICD-10-CM

## 2014-05-06 NOTE — Patient Instructions (Signed)
Please call as needed 

## 2014-05-06 NOTE — Progress Notes (Signed)
Patient ID: Teresa Molina, female    DOB: December 18, 1945, 68 y.o.   MRN: [id number]  HPI - 68yoF followed here for allergic rhinitis/ conjunctivitis, and for insomnia with obstructive sleep apnea.  She has not tolerated CPAP well and has been educated on alternatives. Currently she has a trial set of Provent nasal valves and is still very tentative, but trying. Her tendency is toward "busy-brain" insomnia with difficulty initiating sleep. She is also interested in trying a Silent Night oral appliance offered by her dentist.  She has not been able to lose weight.  For seasonal allergic irritation of eyes, watering and itching, she asks sample Pataday.  10/11/10- OSA, insomnia, allergic rhinitis Provent didn't work out well and she still felt she couldn't sleep throught the night. She is going to look into an oral appliance with her dentist.  Weight loss and sleep hygiene goals reviewed. Spring associated nasal congestion and watery eyes are getting better.  12/13/10- 68yoF followed here for allergic rhinitis/ conjunctivitis, and for insomnia with obstructive sleep apnea, complicated by history of migraine, hypertension, GERD. She follows with Dr. Rex Kras for blood pressure management. He is evaluating recent description of aura with scheduled carotid Dopplers. Her primary sleep complaint remains difficulty initiating and maintaining sleep/insomnia. Obstructive sleep apnea has been identified and we have worked on it persistently. Unfortunately she has not tolerated CPAP or Provent nasal valves, and she has not been able to lose weight. Her dentist is interested in trying to make an oral appliance but apparently has limited experience with this. I have offered to send her to someone who has done a lot more of this kind of therapy. Restless legs have been identified in the past but does not seem to be a major clinical problem. Allergic rhinitis and conjunctivitis are managed symptomatically. Most recently she  had Pataday.  02/21/11- 68yoF followed here for allergic rhinitis/ conjunctivitis, and for insomnia with obstructive sleep apnea, complicated by history of migraine, hypertension, GERD. Since last here in August she has had no more paresthesias. She has given up on CPAP and Provent. nasal valves. Insomnia remains her primary sleep complaint. We have discussed this repeatedly and tried a number of approaches. Basic sleep hygiene is good. Fortunately workup for abnormal breast exam turned out benign. In the last 2-3 weeks she feels fall seasonal allergies bothering her. Eyes itch, water. Ears itch. She has a sore inside her right nostril from blowing her nose a lot. Clear mucus discharge. Denies chest tightness or wheeze.  05/02/11- 68yoF followed here for allergic rhinitis/ conjunctivitis, and for insomnia with obstructive sleep apnea, complicated by history of migraine, hypertension, GERD. Using clonazepam when needed for her insomnia. Failed to tolerate CPAP with multiple efforts, and the Provent nasal valves. Unable to lose weight. Lives alone with no reports of snoring or apnea. Did not get a dental oral device. Eyes have watered some, with little itch, sneeze or nasal congestion. Denies wheeze or cough.   07/04/11- 68yoF followed here for allergic rhinitis/ conjunctivitis, and for insomnia with obstructive sleep apnea/ failed CPAP, complicated by history of migraine, hypertension, GERD. Insomnia remains her primary sleep complaint. She is alone with no reports of snoring, witnessed apnea, or sleep disturbance by limb movements. We have discussed available treatments again. Seasonal rhinitis complaints are not yet the problem. We have reviewed symptomatic therapy is available if needed later in the spring. She is anticipating surgery for breast lump but does not know details yet. We do not anticipate  any respiratory concerns that would affect that surgery.  09/05/11- 68yoF followed here for  allergic rhinitis/ conjunctivitis, and for insomnia with obstructive sleep apnea/ failed CPAP, complicated by history of migraine, hypertension, GERD. Breast lump was a benign lipoma but she is not sure was adequately excised. Intermittent antihistamine. Never really sleeps well or without interruption. Pattern has not changed.  11/07/11- 68yoF followed here for allergic rhinitis/ conjunctivitis, and for insomnia with obstructive sleep apnea/ failed CPAP, complicated by history of migraine, hypertension, GERD. Pt still not using cpap and would like something new. We talked again about available treatments including oral appliances, nasal valves. We feel it is time for reassessment and she should qualify for a home unattended sleep study. Her biggest sleep complaint has always been difficulty initiating and maintaining sleep, mostly with difficulty relaxing. I suggested formal referral for a trial at [facility]. She is playing tennis and walking regularly with no acute problems.  01/11/12- 68 yoF never smoker followed here for allergic rhinitis/ conjunctivitis, and for insomnia with obstructive sleep apnea/ failed CPAP, complicated by history of migraine, hypertension, GERD. Her primary concern continues to be insomnia with difficulty initiating and maintaining sleep. She tried a wide Friday of interventions for sleep apnea but none improved her quality of life or ability to sleep well at night. 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 This creates additional stress, as do significant health problems in her family. We have discussed the impact on her sleep quality. She expresses intent to follow through with the cognitive behavioral therapy referral we had made. Allergy symptoms are generally controlled with OTC antihistamines now spring was the worst time of year.  03/12/12-  68 yoF never smoker followed here for allergic rhinitis/  conjunctivitis, and for insomnia with obstructive sleep apnea/ failed CPAP, complicated by history of migraine, hypertension, GERD. Still not sleeping well and would like to discuss her sleep med. Primary complaint is difficulty initiating and maintaining sleep. We have repeatedly reviewed sleep hygiene. Several medications tried as noted in record. She describes 2 hour sleep latency with Xanax 0.5 mg. Sleep soundly after she finally falls asleep. Lives alone. Stressful, rather political job with [employer].  08/12/13- 67 yoF never smoker followed here for allergic rhinitis/ conjunctivitis, and for insomnia with obstructive sleep apnea/ failed CPAP, complicated by history of migraine, hypertension, GERD FOLLOWS FOR:  Still does not wear cpap.  C/o sinus congestion, sneezing.       Review of Systems-see HPI Constitutional:   No-   weight loss, night sweats, fevers, chills, fatigue, lassitude. + insomnia HEENT:   No-  headaches, difficulty swallowing, tooth/dental problems, sore throat,       No sneezing, itching, ears itch, nasal congestion, post nasal drip,  CV:  No-   chest pain, orthopnea, PND, swelling in lower extremities, anasarca, dizziness, palpitations Resp: No-   shortness of breath with exertion or at rest.              No-   productive cough,  No non-productive cough,  No-  coughing up of blood.              No-   change in color of mucus.  No- wheezing.   Skin: No-   rash or lesions. GI:  No-   heartburn, indigestion, abdominal pain, nausea, vomiting,  GU: MS:  No-   joint pain or swelling.   Neuro- :  Psych:  No-  change in mood or affect. Some- depression or anxiety.  No memory loss.   Physical Exam  General- Alert, Oriented, Affect-appropriate, very pleasant, Distress- none acute, overweight Skin- rash-none, lesions- none, excoriation- none Lymphadenopathy- none Head- atraumatic            Eyes- Gross vision intact, PERRLA, conjunctivae clear secretions             Ears- Hearing, canals normal            Nose- , no -Septal dev, mucus, polyps, erosion, perforation. No turbinate edema or mucus bridging.            Throat- Mallampati III , mucosa clear , drainage- none, tonsils- atrophic Neck- flexible , trachea midline, no stridor , thyroid nl, carotid no bruit Chest - symmetrical excursion , unlabored           Heart/CV- RRR , no murmur , no gallop  , no rub, nl s1 s2                           - JVD- none , edema- none, stasis changes- none, varices- none           Lung- clear to P&A, wheeze- none, cough- none , dullness-none, rub- none           Chest wall-  Abd- Br/ Gen/ Rectal- Not done, not indicated Extrem- cyanosis- none, clubbing, none, atrophy- none, strength- nl Neuro- grossly intact to observation              Patient ID: Teresa Molina, female    DOB: Jan 12, 1946, 68 y.o.   MRN: [id number]  HPI - 16yoF followed here for allergic rhinitis/ conjunctivitis, and for insomnia with obstructive sleep apnea.  She has not tolerated CPAP well and has been educated on alternatives. Currently she has a trial set of Provent nasal valves and is still very tentative, but trying. Her tendency is toward "busy-brain" insomnia with difficulty initiating sleep. She is also interested in trying a Silent Night oral appliance offered by her dentist.  She has not been able to lose weight.  For seasonal allergic irritation of eyes, watering and itching, she asks sample Pataday.  10/11/10- OSA, insomnia, allergic rhinitis Provent didn't work out well and she still felt she couldn't sleep throught the night. She is going to look into an oral appliance with her dentist.  Weight loss and sleep hygiene goals reviewed. Spring associated nasal congestion and watery eyes are getting better.  12/13/10- 68yoF followed here for allergic rhinitis/ conjunctivitis, and for insomnia with obstructive sleep apnea, complicated by history of migraine, hypertension, GERD. She  follows with Dr. Rex Kras for blood pressure management. He is evaluating recent description of aura with scheduled carotid Dopplers. Her primary sleep complaint remains difficulty initiating and maintaining sleep/insomnia. Obstructive sleep apnea has been identified and we have worked on it persistently. Unfortunately she has not tolerated CPAP or Provent nasal valves, and she has not been able to lose weight. Her dentist is interested in trying to make an oral appliance but apparently has limited experience with this. I have offered to send her to someone who has done a lot more of this kind of therapy. Restless legs have been identified in the past but does not seem to be a major clinical problem. Allergic rhinitis and conjunctivitis are managed symptomatically. Most recently she had Pataday.  02/21/11- 68yoF followed here for allergic rhinitis/ conjunctivitis, and for insomnia  with obstructive sleep apnea, complicated by history of migraine, hypertension, GERD. Since last here in August she has had no more paresthesias. She has given up on CPAP and Provent. nasal valves. Insomnia remains her primary sleep complaint. We have discussed this repeatedly and tried a number of approaches. Basic sleep hygiene is good. Fortunately workup for abnormal breast exam turned out benign. In the last 2-3 weeks she feels fall seasonal allergies bothering her. Eyes itch, water. Ears itch. She has a sore inside her right nostril from blowing her nose a lot. Clear mucus discharge. Denies chest tightness or wheeze.  05/02/11- 68yoF followed here for allergic rhinitis/ conjunctivitis, and for insomnia with obstructive sleep apnea, complicated by history of migraine, hypertension, GERD. Using clonazepam when needed for her insomnia. Failed to tolerate CPAP with multiple efforts, and the Provent nasal valves. Unable to lose weight. Lives alone with no reports of snoring or apnea. Did not get a dental oral device. Eyes have  watered some, with little itch, sneeze or nasal congestion. Denies wheeze or cough.   07/04/11- 68yoF followed here for allergic rhinitis/ conjunctivitis, and for insomnia with obstructive sleep apnea/ failed CPAP, complicated by history of migraine, hypertension, GERD. Insomnia remains her primary sleep complaint. She is alone with no reports of snoring, witnessed apnea, or sleep disturbance by limb movements. We have discussed available treatments again. Seasonal rhinitis complaints are not yet the problem. We have reviewed symptomatic therapy is available if needed later in the spring. She is anticipating surgery for breast lump but does not know details yet. We do not anticipate any respiratory concerns that would affect that surgery.  09/05/11- 68yoF followed here for allergic rhinitis/ conjunctivitis, and for insomnia with obstructive sleep apnea/ failed CPAP, complicated by history of migraine, hypertension, GERD. Breast lump was a benign lipoma but she is not sure was adequately excised. Intermittent antihistamine. Never really sleeps well or without interruption. Pattern has not changed.  11/07/11- 68yoF followed here for allergic rhinitis/ conjunctivitis, and for insomnia with obstructive sleep apnea/ failed CPAP, complicated by history of migraine, hypertension, GERD. Pt still not using cpap and would like something new. We talked again about available treatments including oral appliances, nasal valves. We feel it is time for reassessment and she should qualify for a home unattended sleep study. Her biggest sleep complaint has always been difficulty initiating and maintaining sleep, mostly with difficulty relaxing. I suggested formal referral for a trial at [facility]. She is playing tennis and walking regularly with no acute problems.  01/11/12- 68 yoF never smoker followed here for allergic rhinitis/ conjunctivitis, and for insomnia with obstructive sleep apnea/ failed  CPAP, complicated by history of migraine, hypertension, GERD. Her primary concern continues to be insomnia with difficulty initiating and maintaining sleep. She tried a wide Friday of interventions for sleep apnea but none improved her quality of life or ability to sleep well at night. 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 This creates additional stress, as do significant health problems in her family. We have discussed the impact on her sleep quality. She expresses intent to follow through with the cognitive behavioral therapy referral we had made. Allergy symptoms are generally controlled with OTC antihistamines now spring was the worst time of year.  03/12/12-  9 yoF never smoker followed here for allergic rhinitis/ conjunctivitis, and for insomnia with obstructive sleep apnea/ failed CPAP, complicated by history of migraine, hypertension, GERD. Still not sleeping well and would  like to discuss her sleep med. Primary complaint is difficulty initiating and maintaining sleep. We have repeatedly reviewed sleep hygiene. Several medications tried as noted in record. She describes 2 hour sleep latency with Xanax 0.5 mg. Sleep soundly after she finally falls asleep. Lives alone. Stressful, rather political job with [employer].  04/21/12- 51 yoF never smoker followed here for allergic rhinitis/ conjunctivitis, and for insomnia with obstructive sleep apnea/ failed CPAP, complicated by history of migraine, hypertension, GERD. ACUTE VISIT: started last week with GI upset, sore throat on Wenesday/Thursday, lost voice, cough-productive, pressure/pain in Right ear. Denies any fever but has had chills. Little loose phlegm, cough is mostly dry.. Taking supplemental vitamin D. Strep smear negative at her primary physician's office. Some chilling but no fever. GI now okay.  05/12/12  66 yoF never smoker followed here for allergic rhinitis/ conjunctivitis, and  for insomnia with obstructive sleep apnea/ failed CPAP and Provent, complicated by history of migraine, hypertension, GERD. FOLLOWS FOR: occasional dry cough; denies any wheezing or SOB.  She started a Z-Pak yesterday we'll recent rhonchi does without fever. Did have flu shot. She asks Diflucan available because of antibiotic/yeast. We discussed insomnia management again. She is alone and not aware of snoring. I mentioned a book I had found which might help.  09/25/12-66 yoF never smoker followed here for allergic rhinitis/ conjunctivitis, and for insomnia with obstructive sleep apnea/ failed CPAP and Provent, complicated by history of migraine, hypertension, GERD. FOLLOWS FOR: watery eyes, slight cough, feels stuffy from pollen Chronic insomnia pattern is not really changed. She has been under some stress recently related to her job for the [employer]  12/03/12- 66 yoF never smoker followed here for allergic rhinitis/ conjunctivitis, and for insomnia with obstructive sleep apnea/ failed CPAP and Provent, complicated by history of migraine, hypertension, GERD. FOLLOWS FOR: having stuffiness at times; other wise no cough, SOB, wheezing, or congestion. Chronic insomnia pattern is up and down but persistent. Clonazepam 0.5 mg has help when used occasionally. Sleep apnea status is uncertain but not obtrusive. She lives alone. Treatments all interfered with insomnia more than they helped. Occasional rhinitis, mostly treated OTC  02/06/13- 66 yoF never smoker followed here for allergic rhinitis/ conjunctivitis, and for insomnia with obstructive sleep apnea/ failed CPAP and Provent, complicated by history of migraine, hypertension, GERD. FOLLOWS FOR: denies any wheezing, SOB, cough, or congestion. Still having leg jerks and just not sleeping She lives alone and admits she"doesn't like night time and doesn't want to go to sleep". We discussed her experience with meds and she is willing to try Xanax  plus Amitriptyline at bedtime.  04/10/13- 66 yoF never smoker followed here for allergic rhinitis/ conjunctivitis, and for insomnia with obstructive sleep apnea/ failed CPAP and Provent, complicated by history of migraine, hypertension, GERD FOLLOWS FOR:  Breathing doing well.  Still having trouble sleeping ,4 hours per night.  Not using CPAP. She did not follow through with the cognitive behavioral therapy or oral appliance suggestions. We discussed medications and her lifestyle. She lives alone.  06/10/13- 66 yoF never smoker followed here for allergic rhinitis/ conjunctivitis, and for insomnia with obstructive sleep apnea/ failed CPAP and Provent, complicated by history of migraine, hypertension, GERD FOLLOWS FOR: sleeps for about 4.5 hours each night; still not using CPAP -can not stand it. She has tried CPAP and BiPAP, been educated on oral appliances and the importance of maintaining normal body weight. Her primary sleep complaint is insomnia and sleep apnea  interventions have made that worse. She feels her sleep status is stable and she lives with it. Educated again on sleep hygiene. Bothered recently by watery rhinorrhea. Discussed antihistamines. Occasional soreness inside nostrils from wiping her nose, using Neosporin.  Review of Systems-see HPI Constitutional:   No-   weight loss, night sweats, fevers, chills, fatigue, lassitude. + insomnia HEENT:   No-  headaches, difficulty swallowing, tooth/dental problems, no-sore throat,       No- sneezing, itching, no-ears itch,no-nasal congestion, post nasal drip,  CV:  No-   chest pain, orthopnea, PND, swelling in lower extremities, anasarca, dizziness, palpitations Resp: No-   shortness of breath with exertion or at rest.             No- productive cough,  No- non-productive cough,  No-  coughing up of blood.              No-   change in color of mucus.  No- wheezing.   Skin: No-   rash or lesions. GI:  No-   heartburn, indigestion, abdominal  pain, nausea, vomiting,  GU: MS:  No-   joint pain or swelling.   Neuro- :  Psych:  No- change in mood or affect. Some- depression or anxiety.  No memory loss.   Physical Exam  General- Alert, Oriented, Affect-appropriate, very pleasant, Distress- none acute, overweight Skin- rash-none, lesions- none, excoriation- none Lymphadenopathy- none Head- atraumatic            Eyes- Gross vision intact, PERRLA, conjunctivae clear secretions            Ears- Hearing, canals normal            Nose- +mild turbinate edema, No- sores,  no -Septal dev, mucus, polyps, erosion, perforation.             Throat- Mallampati III , mucosa clear-not read , drainage- none, tonsils- atrophic   Neck- flexible , trachea midline, no stridor , thyroid nl, carotid no bruit Chest - symmetrical excursion , unlabored           Heart/CV- RRR , no murmur , no gallop  , no rub, nl s1 s2                           - JVD- none , edema- none, stasis changes- none, varices- none           Lung- clear to P&A, wheeze- none, no-cough  , dullness-none, rub- none           Chest wall-  Abd- Br/ Gen/ Rectal- Not done, not indicated Extrem- cyanosis- none, clubbing, none, atrophy- none, strength- nl Neuro- grossly intact to observation              Patient ID: Teresa Molina, female    DOB: 1946/03/09, 68 y.o.   MRN: [id number]  HPI - 5yoF followed here for allergic rhinitis/ conjunctivitis, and for insomnia with obstructive sleep apnea.  She has not tolerated CPAP well and has been educated on alternatives. Currently she has a trial set of Provent nasal valves and is still very tentative, but trying. Her tendency is toward "busy-brain" insomnia with difficulty initiating sleep. She is also interested in trying a Silent Night oral appliance offered by her dentist.  She has not been able to lose weight.  For seasonal allergic irritation of eyes, watering and itching, she asks sample Pataday.  10/11/10- OSA, insomnia,  allergic rhinitis Provent  didn't work out well and she still felt she couldn't sleep throught the night. She is going to look into an oral appliance with her dentist.  Weight loss and sleep hygiene goals reviewed. Spring associated nasal congestion and watery eyes are getting better.  12/13/10- 68yoF followed here for allergic rhinitis/ conjunctivitis, and for insomnia with obstructive sleep apnea, complicated by history of migraine, hypertension, GERD. She follows with Dr. Rex Kras for blood pressure management. He is evaluating recent description of aura with scheduled carotid Dopplers. Her primary sleep complaint remains difficulty initiating and maintaining sleep/insomnia. Obstructive sleep apnea has been identified and we have worked on it persistently. Unfortunately she has not tolerated CPAP or Provent nasal valves, and she has not been able to lose weight. Her dentist is interested in trying to make an oral appliance but apparently has limited experience with this. I have offered to send her to someone who has done a lot more of this kind of therapy. Restless legs have been identified in the past but does not seem to be a major clinical problem. Allergic rhinitis and conjunctivitis are managed symptomatically. Most recently she had Pataday.  02/21/11- 68yoF followed here for allergic rhinitis/ conjunctivitis, and for insomnia with obstructive sleep apnea, complicated by history of migraine, hypertension, GERD. Since last here in August she has had no more paresthesias. She has given up on CPAP and Provent. nasal valves. Insomnia remains her primary sleep complaint. We have discussed this repeatedly and tried a number of approaches. Basic sleep hygiene is good. Fortunately workup for abnormal breast exam turned out benign. In the last 2-3 weeks she feels fall seasonal allergies bothering her. Eyes itch, water. Ears itch. She has a sore inside her right nostril from blowing her nose a lot. Clear  mucus discharge. Denies chest tightness or wheeze.  05/02/11- 68yoF followed here for allergic rhinitis/ conjunctivitis, and for insomnia with obstructive sleep apnea, complicated by history of migraine, hypertension, GERD. Using clonazepam when needed for her insomnia. Failed to tolerate CPAP with multiple efforts, and the Provent nasal valves. Unable to lose weight. Lives alone with no reports of snoring or apnea. Did not get a dental oral device. Eyes have watered some, with little itch, sneeze or nasal congestion. Denies wheeze or cough.   07/04/11- 68yoF followed here for allergic rhinitis/ conjunctivitis, and for insomnia with obstructive sleep apnea/ failed CPAP, complicated by history of migraine, hypertension, GERD. Insomnia remains her primary sleep complaint. She is alone with no reports of snoring, witnessed apnea, or sleep disturbance by limb movements. We have discussed available treatments again. Seasonal rhinitis complaints are not yet the problem. We have reviewed symptomatic therapy is available if needed later in the spring. She is anticipating surgery for breast lump but does not know details yet. We do not anticipate any respiratory concerns that would affect that surgery.  09/05/11- 68yoF followed here for allergic rhinitis/ conjunctivitis, and for insomnia with obstructive sleep apnea/ failed CPAP, complicated by history of migraine, hypertension, GERD. Breast lump was a benign lipoma but she is not sure was adequately excised. Intermittent antihistamine. Never really sleeps well or without interruption. Pattern has not changed.  11/07/11- 68yoF followed here for allergic rhinitis/ conjunctivitis, and for insomnia with obstructive sleep apnea/ failed CPAP, complicated by history of migraine, hypertension, GERD. Pt still not using cpap and would like something new. We talked again about available treatments including oral appliances, nasal valves. We feel it is time for  reassessment and she should qualify for a  home unattended sleep study. Her biggest sleep complaint has always been difficulty initiating and maintaining sleep, mostly with difficulty relaxing. I suggested formal referral for a trial at [facility]. She is playing tennis and walking regularly with no acute problems.  01/11/12- 68 yoF never smoker followed here for allergic rhinitis/ conjunctivitis, and for insomnia with obstructive sleep apnea/ failed CPAP, complicated by history of migraine, hypertension, GERD. Her primary concern continues to be insomnia with difficulty initiating and maintaining sleep. She tried a wide Friday of interventions for sleep apnea but none improved her quality of life or ability to sleep well at night. 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 This creates additional stress, as do significant health problems in her family. We have discussed the impact on her sleep quality. She expresses intent to follow through with the cognitive behavioral therapy referral we had made. Allergy symptoms are generally controlled with OTC antihistamines now spring was the worst time of year.  03/12/12-  74 yoF never smoker followed here for allergic rhinitis/ conjunctivitis, and for insomnia with obstructive sleep apnea/ failed CPAP, complicated by history of migraine, hypertension, GERD. Still not sleeping well and would like to discuss her sleep med. Primary complaint is difficulty initiating and maintaining sleep. We have repeatedly reviewed sleep hygiene. Several medications tried as noted in record. She describes 2 hour sleep latency with Xanax 0.5 mg. Sleep soundly after she finally falls asleep. Lives alone. Stressful, rather political job with [employer].  Review of Systems-see HPI Constitutional:   No-   weight loss, night sweats, fevers, chills, fatigue, lassitude. + insomnia HEENT:   No-  headaches, difficulty  swallowing, tooth/dental problems, sore throat,       No sneezing, itching, ears itch, nasal congestion, post nasal drip,  CV:  No-   chest pain, orthopnea, PND, swelling in lower extremities, anasarca, dizziness, palpitations Resp: No-   shortness of breath with exertion or at rest.              No-   productive cough,  No non-productive cough,  No-  coughing up of blood.              No-   change in color of mucus.  No- wheezing.   Skin: No-   rash or lesions. GI:  No-   heartburn, indigestion, abdominal pain, nausea, vomiting,  GU: MS:  No-   joint pain or swelling.   Neuro- :  Psych:  No- change in mood or affect. Some- depression or anxiety.  No memory loss.   Physical Exam  General- Alert, Oriented, Affect-appropriate, very pleasant, Distress- none acute, overweight Skin- rash-none, lesions- none, excoriation- none Lymphadenopathy- none Head- atraumatic            Eyes- Gross vision intact, PERRLA, conjunctivae clear secretions            Ears- Hearing, canals normal            Nose- , no -Septal dev, mucus, polyps, erosion, perforation. No turbinate edema or mucus bridging.            Throat- Mallampati III , mucosa clear , drainage- none, tonsils- atrophic Neck- flexible , trachea midline, no stridor , thyroid nl, carotid no bruit Chest - symmetrical excursion , unlabored           Heart/CV- RRR , no murmur , no gallop  , no rub, nl s1 s2                           -  JVD- none , edema- none, stasis changes- none, varices- none           Lung- clear to P&A, wheeze- none, cough- none , dullness-none, rub- none           Chest wall-  Abd- Br/ Gen/ Rectal- Not done, not indicated Extrem- cyanosis- none, clubbing, none, atrophy- none, strength- nl Neuro- grossly intact to observation              Patient ID: Teresa Molina, female    DOB: 1945/11/18, 68 y.o.   MRN: [id number]  HPI - 86yoF followed here for allergic rhinitis/ conjunctivitis, and for insomnia with  obstructive sleep apnea.  She has not tolerated CPAP well and has been educated on alternatives. Currently she has a trial set of Provent nasal valves and is still very tentative, but trying. Her tendency is toward "busy-brain" insomnia with difficulty initiating sleep. She is also interested in trying a Silent Night oral appliance offered by her dentist.  She has not been able to lose weight.  For seasonal allergic irritation of eyes, watering and itching, she asks sample Pataday.  10/11/10- OSA, insomnia, allergic rhinitis Provent didn't work out well and she still felt she couldn't sleep throught the night. She is going to look into an oral appliance with her dentist.  Weight loss and sleep hygiene goals reviewed. Spring associated nasal congestion and watery eyes are getting better.  12/13/10- 68yoF followed here for allergic rhinitis/ conjunctivitis, and for insomnia with obstructive sleep apnea, complicated by history of migraine, hypertension, GERD. She follows with Dr. Rex Kras for blood pressure management. He is evaluating recent description of aura with scheduled carotid Dopplers. Her primary sleep complaint remains difficulty initiating and maintaining sleep/insomnia. Obstructive sleep apnea has been identified and we have worked on it persistently. Unfortunately she has not tolerated CPAP or Provent nasal valves, and she has not been able to lose weight. Her dentist is interested in trying to make an oral appliance but apparently has limited experience with this. I have offered to send her to someone who has done a lot more of this kind of therapy. Restless legs have been identified in the past but does not seem to be a major clinical problem. Allergic rhinitis and conjunctivitis are managed symptomatically. Most recently she had Pataday.  02/21/11- 68yoF followed here for allergic rhinitis/ conjunctivitis, and for insomnia with obstructive sleep apnea, complicated by history of migraine,  hypertension, GERD. Since last here in August she has had no more paresthesias. She has given up on CPAP and Provent. nasal valves. Insomnia remains her primary sleep complaint. We have discussed this repeatedly and tried a number of approaches. Basic sleep hygiene is good. Fortunately workup for abnormal breast exam turned out benign. In the last 2-3 weeks she feels fall seasonal allergies bothering her. Eyes itch, water. Ears itch. She has a sore inside her right nostril from blowing her nose a lot. Clear mucus discharge. Denies chest tightness or wheeze.  05/02/11- 68yoF followed here for allergic rhinitis/ conjunctivitis, and for insomnia with obstructive sleep apnea, complicated by history of migraine, hypertension, GERD. Using clonazepam when needed for her insomnia. Failed to tolerate CPAP with multiple efforts, and the Provent nasal valves. Unable to lose weight. Lives alone with no reports of snoring or apnea. Did not get a dental oral device. Eyes have watered some, with little itch, sneeze or nasal congestion. Denies wheeze or cough.   07/04/11- 68yoF followed here for allergic rhinitis/ conjunctivitis, and for  insomnia with obstructive sleep apnea/ failed CPAP, complicated by history of migraine, hypertension, GERD. Insomnia remains her primary sleep complaint. She is alone with no reports of snoring, witnessed apnea, or sleep disturbance by limb movements. We have discussed available treatments again. Seasonal rhinitis complaints are not yet the problem. We have reviewed symptomatic therapy is available if needed later in the spring. She is anticipating surgery for breast lump but does not know details yet. We do not anticipate any respiratory concerns that would affect that surgery.  09/05/11- 68yoF followed here for allergic rhinitis/ conjunctivitis, and for insomnia with obstructive sleep apnea/ failed CPAP, complicated by history of migraine, hypertension, GERD. Breast lump was a  benign lipoma but she is not sure was adequately excised. Intermittent antihistamine. Never really sleeps well or without interruption. Pattern has not changed.  11/07/11- 68yoF followed here for allergic rhinitis/ conjunctivitis, and for insomnia with obstructive sleep apnea/ failed CPAP, complicated by history of migraine, hypertension, GERD. Pt still not using cpap and would like something new. We talked again about available treatments including oral appliances, nasal valves. We feel it is time for reassessment and she should qualify for a home unattended sleep study. Her biggest sleep complaint has always been difficulty initiating and maintaining sleep, mostly with difficulty relaxing. I suggested formal referral for a trial at [facility]. She is playing tennis and walking regularly with no acute problems.  01/11/12- 68 yoF never smoker followed here for allergic rhinitis/ conjunctivitis, and for insomnia with obstructive sleep apnea/ failed CPAP, complicated by history of migraine, hypertension, GERD. Her primary concern continues to be insomnia with difficulty initiating and maintaining sleep. She tried a wide Friday of interventions for sleep apnea but none improved her quality of life or ability to sleep well at night. 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 This creates additional stress, as do significant health problems in her family. We have discussed the impact on her sleep quality. She expresses intent to follow through with the cognitive behavioral therapy referral we had made. Allergy symptoms are generally controlled with OTC antihistamines now spring was the worst time of year.  03/12/12-  69 yoF never smoker followed here for allergic rhinitis/ conjunctivitis, and for insomnia with obstructive sleep apnea/ failed CPAP, complicated by history of migraine, hypertension, GERD. Still not sleeping well and would like to discuss  her sleep med. Primary complaint is difficulty initiating and maintaining sleep. We have repeatedly reviewed sleep hygiene. Several medications tried as noted in record. She describes 2 hour sleep latency with Xanax 0.5 mg. Sleep soundly after she finally falls asleep. Lives alone. Stressful, rather political job with [employer].  04/21/12- 51 yoF never smoker followed here for allergic rhinitis/ conjunctivitis, and for insomnia with obstructive sleep apnea/ failed CPAP, complicated by history of migraine, hypertension, GERD. ACUTE VISIT: started last week with GI upset, sore throat on Wenesday/Thursday, lost voice, cough-productive, pressure/pain in Right ear. Denies any fever but has had chills. Little loose phlegm, cough is mostly dry.. Taking supplemental vitamin D. Strep smear negative at her primary physician's office. Some chilling but no fever. GI now okay.  05/12/12  66 yoF never smoker followed here for allergic rhinitis/ conjunctivitis, and for insomnia with obstructive sleep apnea/ failed CPAP and Provent, complicated by history of migraine, hypertension, GERD. FOLLOWS FOR: occasional dry cough; denies any wheezing or SOB.  She started a Z-Pak yesterday we'll recent rhonchi does without fever. Did have  flu shot. She asks Diflucan available because of antibiotic/yeast. We discussed insomnia management again. She is alone and not aware of snoring. I mentioned a book I had found which might help.  09/25/12-66 yoF never smoker followed here for allergic rhinitis/ conjunctivitis, and for insomnia with obstructive sleep apnea/ failed CPAP and Provent, complicated by history of migraine, hypertension, GERD. FOLLOWS FOR: watery eyes, slight cough, feels stuffy from pollen Chronic insomnia pattern is not really changed. She has been under some stress recently related to her job for the [employer]  12/03/12- 66 yoF never smoker followed here for allergic rhinitis/  conjunctivitis, and for insomnia with obstructive sleep apnea/ failed CPAP and Provent, complicated by history of migraine, hypertension, GERD. FOLLOWS FOR: having stuffiness at times; other wise no cough, SOB, wheezing, or congestion. Chronic insomnia pattern is up and down but persistent. Clonazepam 0.5 mg has help when used occasionally. Sleep apnea status is uncertain but not obtrusive. She lives alone. Treatments all interfered with insomnia more than they helped. Occasional rhinitis, mostly treated OTC  02/06/13- 66 yoF never smoker followed here for allergic rhinitis/ conjunctivitis, and for insomnia with obstructive sleep apnea/ failed CPAP and Provent, complicated by history of migraine, hypertension, GERD. FOLLOWS FOR: denies any wheezing, SOB, cough, or congestion. Still having leg jerks and just not sleeping She lives alone and admits she"doesn't like night time and doesn't want to go to sleep". We discussed her experience with meds and she is willing to try Xanax plus Amitriptyline at bedtime.  04/10/13- 66 yoF never smoker followed here for allergic rhinitis/ conjunctivitis, and for insomnia with obstructive sleep apnea/ failed CPAP and Provent, complicated by history of migraine, hypertension, GERD FOLLOWS FOR:  Breathing doing well.  Still having trouble sleeping ,4 hours per night.  Not using CPAP. She did not follow through with the cognitive behavioral therapy or oral appliance suggestions. We discussed medications and her lifestyle. She lives alone.  06/10/13- 66 yoF never smoker followed here for allergic rhinitis/ conjunctivitis, and for insomnia with obstructive sleep apnea/ failed CPAP and Provent, complicated by history of migraine, hypertension, GERD FOLLOWS FOR: sleeps for about 4.5 hours each night; still not using CPAP -can not stand it. She has tried CPAP and BiPAP, been educated on oral appliances and the importance of maintaining normal body weight. Her primary sleep  complaint is insomnia and sleep apnea interventions have made that worse. She feels her sleep status is stable and she lives with it. Educated again on sleep hygiene. Bothered recently by watery rhinorrhea. Discussed antihistamines. Occasional soreness inside nostrils from wiping her nose, using Neosporin.  09/10/13- 67 yoF never smoker followed here for allergic rhinitis/ conjunctivitis, and for insomnia with obstructive sleep apnea/ failed CPAP and Provent, complicated by history of migraine, hypertension, GERD Minor seasonal stuffiness, no infection or chest discomfort. Antihistamines if needed. Sleep- bedtime 1:30-6:30 or 7:00AM. Admits tired. Discussed sleep hygiene.   12/31/13- 67 yoF never smoker followed here for allergic rhinitis/ conjunctivitis, and for insomnia with obstructive sleep apnea/ failed CPAP and Provent, complicated by history of migraine, hypertension, GERD   03/03/14-  67 yoF never smoker followed here for allergic rhinitis/ conjunctivitis, and for insomnia with obstructive sleep apnea/ failed CPAP and Provent, complicated by history of migraine, hypertension, GERD FOLLOWS FOR: Patient still not wearing CPAP. She has cough for past 2 weeks. She is having greenish phlegm production sometime.     Review of Systems-see HPI Constitutional:   No-   weight loss, night  sweats, fevers, chills, fatigue, lassitude. + insomnia HEENT:   No-  headaches, difficulty swallowing, tooth/dental problems, no-sore throat,       No- sneezing, itching, no-ears itch,+nasal congestion, post nasal drip,  CV:  No-   chest pain, orthopnea, PND, swelling in lower extremities, anasarca, dizziness, palpitations Resp: No-   shortness of breath with exertion or at rest.             No- productive cough,  No- non-productive cough,  No-  coughing up of blood.              No-   change in color of mucus.  No- wheezing.   Skin: No-   rash or lesions. GI:  No-   heartburn, indigestion, abdominal pain,  nausea, vomiting,  GU: MS:  No-   joint pain or swelling.   Neuro- :  Psych:  No- change in mood or affect. Some- depression or anxiety.  No memory loss.   Physical Exam  General- Alert, Oriented, Affect-appropriate, very pleasant, Distress- none acute, overweight Skin- rash-none, lesions- none, excoriation- none Lymphadenopathy- none Head- atraumatic            Eyes- Gross vision intact, PERRLA, conjunctivae clear secretions            Ears- Hearing, canals normal            Nose- +mild turbinate edema, No- sores,  no -Septal dev, mucus, polyps, erosion, perforation.             Throat- Mallampati III , mucosa clear-not read , drainage- none, tonsils- atrophic   Neck- flexible , trachea midline, no stridor , thyroid nl, carotid no bruit Chest - symmetrical excursion , unlabored           Heart/CV- RRR , no murmur , no gallop  , no rub, nl s1 s2                           - JVD- none , edema- none, stasis changes- none, varices- none           Lung- clear to P&A, wheeze- none, no-cough  , dullness-none, rub- none           Chest wall-  Abd- Br/ Gen/ Rectal- Not done, not indicated Extrem- cyanosis- none, clubbing, none, atrophy- none, strength- nl Neuro- grossly intact to observation              Patient ID: Teresa Molina, female    DOB: [birth year], 68 y.o.   MRN: [id number]  HPI - 54yoF followed here for allergic rhinitis/ conjunctivitis, and for insomnia with obstructive sleep apnea.  She has not tolerated CPAP well and has been educated on alternatives. Currently she has a trial set of Provent nasal valves and is still very tentative, but trying. Her tendency is toward "busy-brain" insomnia with difficulty initiating sleep. She is also interested in trying a Silent Night oral appliance offered by her dentist.  She has not been able to lose weight.  For seasonal allergic irritation of eyes, watering and itching, she asks sample Pataday.  10/11/10- OSA, insomnia, allergic  rhinitis Provent didn't work out well and she still felt she couldn't sleep throught the night. She is going to look into an oral appliance with her dentist.  Weight loss and sleep hygiene goals reviewed. Spring associated nasal congestion and watery eyes are getting better.  12/13/10- 68yoF followed here for allergic rhinitis/ conjunctivitis,  and for insomnia with obstructive sleep apnea, complicated by history of migraine, hypertension, GERD. She follows with Dr. Rex Kras for blood pressure management. He is evaluating recent description of aura with scheduled carotid Dopplers. Her primary sleep complaint remains difficulty initiating and maintaining sleep/insomnia. Obstructive sleep apnea has been identified and we have worked on it persistently. Unfortunately she has not tolerated CPAP or Provent nasal valves, and she has not been able to lose weight. Her dentist is interested in trying to make an oral appliance but apparently has limited experience with this. I have offered to send her to someone who has done a lot more of this kind of therapy. Restless legs have been identified in the past but does not seem to be a major clinical problem. Allergic rhinitis and conjunctivitis are managed symptomatically. Most recently she had Pataday.  02/21/11- 68yoF followed here for allergic rhinitis/ conjunctivitis, and for insomnia with obstructive sleep apnea, complicated by history of migraine, hypertension, GERD. Since last here in August she has had no more paresthesias. She has given up on CPAP and Provent. nasal valves. Insomnia remains her primary sleep complaint. We have discussed this repeatedly and tried a number of approaches. Basic sleep hygiene is good. Fortunately workup for abnormal breast exam turned out benign. In the last 2-3 weeks she feels fall seasonal allergies bothering her. Eyes itch, water. Ears itch. She has a sore inside her right nostril from blowing her nose a lot. Clear mucus  discharge. Denies chest tightness or wheeze.  05/02/11- 68yoF followed here for allergic rhinitis/ conjunctivitis, and for insomnia with obstructive sleep apnea, complicated by history of migraine, hypertension, GERD. Using clonazepam when needed for her insomnia. Failed to tolerate CPAP with multiple efforts, and the Provent nasal valves. Unable to lose weight. Lives alone with no reports of snoring or apnea. Did not get a dental oral device. Eyes have watered some, with little itch, sneeze or nasal congestion. Denies wheeze or cough.   07/04/11- 68yoF followed here for allergic rhinitis/ conjunctivitis, and for insomnia with obstructive sleep apnea/ failed CPAP, complicated by history of migraine, hypertension, GERD. Insomnia remains her primary sleep complaint. She is alone with no reports of snoring, witnessed apnea, or sleep disturbance by limb movements. We have discussed available treatments again. Seasonal rhinitis complaints are not yet the problem. We have reviewed symptomatic therapy is available if needed later in the spring. She is anticipating surgery for breast lump but does not know details yet. We do not anticipate any respiratory concerns that would affect that surgery.  09/05/11- 68yoF followed here for allergic rhinitis/ conjunctivitis, and for insomnia with obstructive sleep apnea/ failed CPAP, complicated by history of migraine, hypertension, GERD. Breast lump was a benign lipoma but she is not sure was adequately excised. Intermittent antihistamine. Never really sleeps well or without interruption. Pattern has not changed.  11/07/11- 68yoF followed here for allergic rhinitis/ conjunctivitis, and for insomnia with obstructive sleep apnea/ failed CPAP, complicated by history of migraine, hypertension, GERD. Pt still not using cpap and would like something new. We talked again about available treatments including oral appliances, nasal valves. We feel it is time for reassessment and  she should qualify for a home unattended sleep study. Her biggest sleep complaint has always been difficulty initiating and maintaining sleep, mostly with difficulty relaxing. I suggested formal referral for a trial at [facility] Behavior[facility] Therapy. She is playing tennis and walking regularly with no acute problems.  01/11/12- 38 yoF never smoker followed here for allergic rhinitis/  conjunctivitis, and for insomnia with obstructive sleep apnea/ failed CPAP, complicated by history of migraine, hypertension, GERD. Her primary concern continues to be insomnia with difficulty initiating and maintaining sleep. She tried a wide Friday of interventions for sleep apnea but none improved her quality of life or ability to sleep well at night. 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 This creates additional stress, as do significant health problems in her family. We have discussed the impact on her sleep quality. She expresses intent to follow through with the cognitive behavioral therapy referral we had made. Allergy symptoms are generally controlled with OTC antihistamines now spring was the worst time of year.  03/12/12-  48 yoF never smoker followed here for allergic rhinitis/ conjunctivitis, and for insomnia with obstructive sleep apnea/ failed CPAP, complicated by history of migraine, hypertension, GERD. Still not sleeping well and would like to discuss her sleep med. Primary complaint is difficulty initiating and maintaining sleep. We have repeatedly reviewed sleep hygiene. Several medications tried as noted in record. She describes 2 hour sleep latency with Xanax 0.5 mg. Sleep soundly after she finally falls asleep. Lives alone. Stressful, rather political job with [employer].  08/12/13- 67 yoF never smoker followed here for allergic rhinitis/ conjunctivitis, and for insomnia with obstructive sleep apnea/ failed CPAP, complicated by history of migraine,  hypertension, GERD FOLLOWS FOR:  Still does not wear cpap.  C/o sinus congestion, sneezing.       Review of Systems-see HPI Constitutional:   No-   weight loss, night sweats, fevers, chills, fatigue, lassitude. + insomnia HEENT:   No-  headaches, difficulty swallowing, tooth/dental problems, sore throat,       No sneezing, itching, ears itch, nasal congestion, post nasal drip,  CV:  No-   chest pain, orthopnea, PND, swelling in lower extremities, anasarca, dizziness, palpitations Resp: No-   shortness of breath with exertion or at rest.              No-   productive cough,  No non-productive cough,  No-  coughing up of blood.              No-   change in color of mucus.  No- wheezing.   Skin: No-   rash or lesions. GI:  No-   heartburn, indigestion, abdominal pain, nausea, vomiting,  GU: MS:  No-   joint pain or swelling.   Neuro- :  Psych:  No- change in mood or affect. Some- depression or anxiety.  No memory loss.   Physical Exam  General- Alert, Oriented, Affect-appropriate, very pleasant, Distress- none acute, overweight Skin- rash-none, lesions- none, excoriation- none Lymphadenopathy- none Head- atraumatic            Eyes- Gross vision intact, PERRLA, conjunctivae clear secretions            Ears- Hearing, canals normal            Nose- , no -Septal dev, mucus, polyps, erosion, perforation. No turbinate edema or mucus bridging.            Throat- Mallampati III , mucosa clear , drainage- none, tonsils- atrophic Neck- flexible , trachea midline, no stridor , thyroid nl, carotid no bruit Chest - symmetrical excursion , unlabored           Heart/CV- RRR , no murmur , no gallop  , no rub, nl s1 s2                           -  JVD- none , edema- none, stasis changes- none, varices- none           Lung- clear to P&A, wheeze- none, cough- none , dullness-none, rub- none           Chest wall-  Abd- Br/ Gen/ Rectal- Not done, not indicated Extrem- cyanosis- none, clubbing, none,  atrophy- none, strength- nl Neuro- grossly intact to observation              Patient ID: Teresa Molina, female    DOB: 1945/12/25, 68 y.o.   MRN: [id number]  HPI - 53yoF followed here for allergic rhinitis/ conjunctivitis, and for insomnia with obstructive sleep apnea.  She has not tolerated CPAP well and has been educated on alternatives. Currently she has a trial set of Provent nasal valves and is still very tentative, but trying. Her tendency is toward "busy-brain" insomnia with difficulty initiating sleep. She is also interested in trying a Silent Night oral appliance offered by her dentist.  She has not been able to lose weight.  For seasonal allergic irritation of eyes, watering and itching, she asks sample Pataday.  10/11/10- OSA, insomnia, allergic rhinitis Provent didn't work out well and she still felt she couldn't sleep throught the night. She is going to look into an oral appliance with her dentist.  Weight loss and sleep hygiene goals reviewed. Spring associated nasal congestion and watery eyes are getting better.  12/13/10- 68yoF followed here for allergic rhinitis/ conjunctivitis, and for insomnia with obstructive sleep apnea, complicated by history of migraine, hypertension, GERD. She follows with Dr. Rex Kras for blood pressure management. He is evaluating recent description of aura with scheduled carotid Dopplers. Her primary sleep complaint remains difficulty initiating and maintaining sleep/insomnia. Obstructive sleep apnea has been identified and we have worked on it persistently. Unfortunately she has not tolerated CPAP or Provent nasal valves, and she has not been able to lose weight. Her dentist is interested in trying to make an oral appliance but apparently has limited experience with this. I have offered to send her to someone who has done a lot more of this kind of therapy. Restless legs have been identified in the past but does not seem to be a major clinical  problem. Allergic rhinitis and conjunctivitis are managed symptomatically. Most recently she had Pataday.  02/21/11- 68yoF followed here for allergic rhinitis/ conjunctivitis, and for insomnia with obstructive sleep apnea, complicated by history of migraine, hypertension, GERD. Since last here in August she has had no more paresthesias. She has given up on CPAP and Provent. nasal valves. Insomnia remains her primary sleep complaint. We have discussed this repeatedly and tried a number of approaches. Basic sleep hygiene is good. Fortunately workup for abnormal breast exam turned out benign. In the last 2-3 weeks she feels fall seasonal allergies bothering her. Eyes itch, water. Ears itch. She has a sore inside her right nostril from blowing her nose a lot. Clear mucus discharge. Denies chest tightness or wheeze.  05/02/11- 68yoF followed here for allergic rhinitis/ conjunctivitis, and for insomnia with obstructive sleep apnea, complicated by history of migraine, hypertension, GERD. Using clonazepam when needed for her insomnia. Failed to tolerate CPAP with multiple efforts, and the Provent nasal valves. Unable to lose weight. Lives alone with no reports of snoring or apnea. Did not get a dental oral device. Eyes have watered some, with little itch, sneeze or nasal congestion. Denies wheeze or cough.   07/04/11- 68yoF followed here for allergic rhinitis/ conjunctivitis, and for  insomnia with obstructive sleep apnea/ failed CPAP, complicated by history of migraine, hypertension, GERD. Insomnia remains her primary sleep complaint. She is alone with no reports of snoring, witnessed apnea, or sleep disturbance by limb movements. We have discussed available treatments again. Seasonal rhinitis complaints are not yet the problem. We have reviewed symptomatic therapy is available if needed later in the spring. She is anticipating surgery for breast lump but does not know details yet. We do not anticipate any  respiratory concerns that would affect that surgery.  09/05/11- 68yoF followed here for allergic rhinitis/ conjunctivitis, and for insomnia with obstructive sleep apnea/ failed CPAP, complicated by history of migraine, hypertension, GERD. Breast lump was a benign lipoma but she is not sure was adequately excised. Intermittent antihistamine. Never really sleeps well or without interruption. Pattern has not changed.  11/07/11- 68yoF followed here for allergic rhinitis/ conjunctivitis, and for insomnia with obstructive sleep apnea/ failed CPAP, complicated by history of migraine, hypertension, GERD. Pt still not using cpap and would like something new. We talked again about available treatments including oral appliances, nasal valves. We feel it is time for reassessment and she should qualify for a home unattended sleep study. Her biggest sleep complaint has always been difficulty initiating and maintaining sleep, mostly with difficulty relaxing. I suggested formal referral for a trial at Cognitive Behavioral Therapy. She is playing tennis and walking regularly with no acute problems.  01/11/12- 68 yoF never smoker followed here for allergic rhinitis/ conjunctivitis, and for insomnia with obstructive sleep apnea/ failed CPAP, complicated by history of migraine, hypertension, GERD. Her primary concern continues to be insomnia with difficulty initiating and maintaining sleep. She tried a wide Friday of interventions for sleep apnea but none improved her quality of life or ability to sleep well at night. 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 This creates additional stress, as do significant health problems in her family. We have discussed the impact on her sleep quality. She expresses intent to follow through with the cognitive behavioral therapy referral we had made. Allergy symptoms are generally controlled with OTC antihistamines now spring was the worst time of  year.  03/12/12-  47 yoF never smoker followed here for allergic rhinitis/ conjunctivitis, and for insomnia with obstructive sleep apnea/ failed CPAP, complicated by history of migraine, hypertension, GERD. Still not sleeping well and would like to discuss her sleep med. Primary complaint is difficulty initiating and maintaining sleep. We have repeatedly reviewed sleep hygiene. Several medications tried as noted in record. She describes 2 hour sleep latency with Xanax 0.5 mg. Sleep soundly after she finally falls asleep. Lives alone. Stressful, rather political job with [employer].  04/21/12- 10 yoF never smoker followed here for allergic rhinitis/ conjunctivitis, and for insomnia with obstructive sleep apnea/ failed CPAP, complicated by history of migraine, hypertension, GERD. ACUTE VISIT: started last week with GI upset, sore throat on Wenesday/Thursday, lost voice, cough-productive, pressure/pain in Right ear. Denies any fever but has had chills. Little loose phlegm, cough is mostly dry.. Taking supplemental vitamin D. Strep smear negative at her primary physician's office. Some chilling but no fever. GI now okay.  05/12/12  66 yoF never smoker followed here for allergic rhinitis/ conjunctivitis, and for insomnia with obstructive sleep apnea/ failed CPAP and Provent, complicated by history of migraine, hypertension, GERD. FOLLOWS FOR: occasional dry cough; denies any wheezing or SOB.  She started a Z-Pak yesterday we'll recent rhonchi does without fever. Did have  flu shot. She asks Diflucan available because of antibiotic/yeast. We discussed insomnia management again. She is alone and not aware of snoring. I mentioned a book I had found which might help.  09/25/12-66 yoF never smoker followed here for allergic rhinitis/ conjunctivitis, and for insomnia with obstructive sleep apnea/ failed CPAP and Provent, complicated by history of migraine, hypertension, GERD. FOLLOWS FOR: watery  eyes, slight cough, feels stuffy from pollen Chronic insomnia pattern is not really changed. She has been under some stress recently related to her job for the [employer]  12/03/12- 66 yoF never smoker followed here for allergic rhinitis/ conjunctivitis, and for insomnia with obstructive sleep apnea/ failed CPAP and Provent, complicated by history of migraine, hypertension, GERD. FOLLOWS FOR: having stuffiness at times; other wise no cough, SOB, wheezing, or congestion. Chronic insomnia pattern is up and down but persistent. Clonazepam 0.5 mg has help when used occasionally. Sleep apnea status is uncertain but not obtrusive. She lives alone. Treatments all interfered with insomnia more than they helped. Occasional rhinitis, mostly treated OTC  02/06/13- 66 yoF never smoker followed here for allergic rhinitis/ conjunctivitis, and for insomnia with obstructive sleep apnea/ failed CPAP and Provent, complicated by history of migraine, hypertension, GERD. FOLLOWS FOR: denies any wheezing, SOB, cough, or congestion. Still having leg jerks and just not sleeping She lives alone and admits she"doesn't like night time and doesn't want to go to sleep". We discussed her experience with meds and she is willing to try Xanax plus Amitriptyline at bedtime.  04/10/13- 66 yoF never smoker followed here for allergic rhinitis/ conjunctivitis, and for insomnia with obstructive sleep apnea/ failed CPAP and Provent, complicated by history of migraine, hypertension, GERD FOLLOWS FOR:  Breathing doing well.  Still having trouble sleeping ,4 hours per night.  Not using CPAP. She did not follow through with the cognitive behavioral therapy or oral appliance suggestions. We discussed medications and her lifestyle. She lives alone.  06/10/13- 66 yoF never smoker followed here for allergic rhinitis/ conjunctivitis, and for insomnia with obstructive sleep apnea/ failed CPAP and Provent, complicated by history of migraine,  hypertension, GERD FOLLOWS FOR: sleeps for about 4.5 hours each night; still not using CPAP -can not stand it. She has tried CPAP and BiPAP, been educated on oral appliances and the importance of maintaining normal body weight. Her primary sleep complaint is insomnia and sleep apnea interventions have made that worse. She feels her sleep status is stable and she lives with it. Educated again on sleep hygiene. Bothered recently by watery rhinorrhea. Discussed antihistamines. Occasional soreness inside nostrils from wiping her nose, using Neosporin.  Review of Systems-see HPI Constitutional:   No-   weight loss, night sweats, fevers, chills, fatigue, lassitude. + insomnia HEENT:   No-  headaches, difficulty swallowing, tooth/dental problems, no-sore throat,       No- sneezing, itching, no-ears itch,no-nasal congestion, post nasal drip,  CV:  No-   chest pain, orthopnea, PND, swelling in lower extremities, anasarca, dizziness, palpitations Resp: No-   shortness of breath with exertion or at rest.             No- productive cough,  No- non-productive cough,  No-  coughing up of blood.              No-   change in color of mucus.  No- wheezing.   Skin: No-   rash or lesions. GI:  No-   heartburn, indigestion, abdominal pain, nausea, vomiting,  GU: MS:  No-  joint pain or swelling.   Neuro- :  Psych:  No- change in mood or affect. Some- depression or anxiety.  No memory loss.   Physical Exam  General- Alert, Oriented, Affect-appropriate, very pleasant, Distress- none acute, overweight Skin- rash-none, lesions- none, excoriation- none Lymphadenopathy- none Head- atraumatic            Eyes- Gross vision intact, PERRLA, conjunctivae clear secretions            Ears- Hearing, canals normal            Nose- +mild turbinate edema, No- sores,  no -Septal dev, mucus, polyps, erosion, perforation.             Throat- Mallampati III , mucosa clear-not read , drainage- none, tonsils- atrophic   Neck-  flexible , trachea midline, no stridor , thyroid nl, carotid no bruit Chest - symmetrical excursion , unlabored           Heart/CV- RRR , no murmur , no gallop  , no rub, nl s1 s2                           - JVD- none , edema- none, stasis changes- none, varices- none           Lung- clear to P&A, wheeze- none, no-cough  , dullness-none, rub- none           Chest wall-  Abd- Br/ Gen/ Rectal- Not done, not indicated Extrem- cyanosis- none, clubbing, none, atrophy- none, strength- nl Neuro- grossly intact to observation              Patient ID: Teresa Molina, female    DOB: Apr 08, 1946, 68 y.o.   MRN: [id number]  HPI - 55yoF followed here for allergic rhinitis/ conjunctivitis, and for insomnia with obstructive sleep apnea.  She has not tolerated CPAP well and has been educated on alternatives. Currently she has a trial set of Provent nasal valves and is still very tentative, but trying. Her tendency is toward "busy-brain" insomnia with difficulty initiating sleep. She is also interested in trying a Silent Night oral appliance offered by her dentist.  She has not been able to lose weight.  For seasonal allergic irritation of eyes, watering and itching, she asks sample Pataday.  10/11/10- OSA, insomnia, allergic rhinitis Provent didn't work out well and she still felt she couldn't sleep throught the night. She is going to look into an oral appliance with her dentist.  Weight loss and sleep hygiene goals reviewed. Spring associated nasal congestion and watery eyes are getting better.  12/13/10- 68yoF followed here for allergic rhinitis/ conjunctivitis, and for insomnia with obstructive sleep apnea, complicated by history of migraine, hypertension, GERD. She follows with Dr. Rex Kras for blood pressure management. He is evaluating recent description of aura with scheduled carotid Dopplers. Her primary sleep complaint remains difficulty initiating and maintaining sleep/insomnia. Obstructive sleep  apnea has been identified and we have worked on it persistently. Unfortunately she has not tolerated CPAP or Provent nasal valves, and she has not been able to lose weight. Her dentist is interested in trying to make an oral appliance but apparently has limited experience with this. I have offered to send her to someone who has done a lot more of this kind of therapy. Restless legs have been identified in the past but does not seem to be a major clinical problem. Allergic rhinitis and conjunctivitis are managed symptomatically. Most recently  she had Pataday.  02/21/11- 68yoF followed here for allergic rhinitis/ conjunctivitis, and for insomnia with obstructive sleep apnea, complicated by history of migraine, hypertension, GERD. Since last here in August she has had no more paresthesias. She has given up on CPAP and Provent. nasal valves. Insomnia remains her primary sleep complaint. We have discussed this repeatedly and tried a number of approaches. Basic sleep hygiene is good. Fortunately workup for abnormal breast exam turned out benign. In the last 2-3 weeks she feels fall seasonal allergies bothering her. Eyes itch, water. Ears itch. She has a sore inside her right nostril from blowing her nose a lot. Clear mucus discharge. Denies chest tightness or wheeze.  05/02/11- 68yoF followed here for allergic rhinitis/ conjunctivitis, and for insomnia with obstructive sleep apnea, complicated by history of migraine, hypertension, GERD. Using clonazepam when needed for her insomnia. Failed to tolerate CPAP with multiple efforts, and the Provent nasal valves. Unable to lose weight. Lives alone with no reports of snoring or apnea. Did not get a dental oral device. Eyes have watered some, with little itch, sneeze or nasal congestion. Denies wheeze or cough.   07/04/11- 68yoF followed here for allergic rhinitis/ conjunctivitis, and for insomnia with obstructive sleep apnea/ failed CPAP, complicated by history of  migraine, hypertension, GERD. Insomnia remains her primary sleep complaint. She is alone with no reports of snoring, witnessed apnea, or sleep disturbance by limb movements. We have discussed available treatments again. Seasonal rhinitis complaints are not yet the problem. We have reviewed symptomatic therapy is available if needed later in the spring. She is anticipating surgery for breast lump but does not know details yet. We do not anticipate any respiratory concerns that would affect that surgery.  09/05/11- 68yoF followed here for allergic rhinitis/ conjunctivitis, and for insomnia with obstructive sleep apnea/ failed CPAP, complicated by history of migraine, hypertension, GERD. Breast lump was a benign lipoma but she is not sure was adequately excised. Intermittent antihistamine. Never really sleeps well or without interruption. Pattern has not changed.  11/07/11- 68yoF followed here for allergic rhinitis/ conjunctivitis, and for insomnia with obstructive sleep apnea/ failed CPAP, complicated by history of migraine, hypertension, GERD. Pt still not using cpap and would like something new. We talked again about available treatments including oral appliances, nasal valves. We feel it is time for reassessment and she should qualify for a home unattended sleep study. Her biggest sleep complaint has always been difficulty initiating and maintaining sleep, mostly with difficulty relaxing. I suggested formal referral for a trial at Cognitive Behavioral Therapy. She is playing tennis and walking regularly with no acute problems.  01/11/12- 68 yoF never smoker followed here for allergic rhinitis/ conjunctivitis, and for insomnia with obstructive sleep apnea/ failed CPAP, complicated by history of migraine, hypertension, GERD. Her primary concern continues to be insomnia with difficulty initiating and maintaining sleep. She tried a wide Friday of interventions for sleep apnea but none improved her quality  of life or ability to sleep well at night. 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 This creates additional stress, as do significant health problems in her family. We have discussed the impact on her sleep quality. She expresses intent to follow through with the cognitive behavioral therapy referral we had made. Allergy symptoms are generally controlled with OTC antihistamines now spring was the worst time of year.  03/12/12-  42 yoF never smoker followed here for allergic rhinitis/ conjunctivitis, and for insomnia with obstructive sleep apnea/  failed CPAP, complicated by history of migraine, hypertension, GERD. Still not sleeping well and would like to discuss her sleep med. Primary complaint is difficulty initiating and maintaining sleep. We have repeatedly reviewed sleep hygiene. Several medications tried as noted in record. She describes 2 hour sleep latency with Xanax 0.5 mg. Sleep soundly after she finally falls asleep. Lives alone. Stressful, rather political job with [employer].  Review of Systems-see HPI Constitutional:   No-   weight loss, night sweats, fevers, chills, fatigue, lassitude. + insomnia HEENT:   No-  headaches, difficulty swallowing, tooth/dental problems, sore throat,       No sneezing, itching, ears itch, nasal congestion, post nasal drip,  CV:  No-   chest pain, orthopnea, PND, swelling in lower extremities, anasarca, dizziness, palpitations Resp: No-   shortness of breath with exertion or at rest.              No-   productive cough,  No non-productive cough,  No-  coughing up of blood.              No-   change in color of mucus.  No- wheezing.   Skin: No-   rash or lesions. GI:  No-   heartburn, indigestion, abdominal pain, nausea, vomiting,  GU: MS:  No-   joint pain or swelling.   Neuro- :  Psych:  No- change in mood or affect. Some- depression or anxiety.  No memory loss.   Physical Exam  General- Alert,  Oriented, Affect-appropriate, very pleasant, Distress- none acute, overweight Skin- rash-none, lesions- none, excoriation- none Lymphadenopathy- none Head- atraumatic            Eyes- Gross vision intact, PERRLA, conjunctivae clear secretions            Ears- Hearing, canals normal            Nose- , no -Septal dev, mucus, polyps, erosion, perforation. No turbinate edema or mucus bridging.            Throat- Mallampati III , mucosa clear , drainage- none, tonsils- atrophic Neck- flexible , trachea midline, no stridor , thyroid nl, carotid no bruit Chest - symmetrical excursion , unlabored           Heart/CV- RRR , no murmur , no gallop  , no rub, nl s1 s2                           - JVD- none , edema- none, stasis changes- none, varices- none           Lung- clear to P&A, wheeze- none, cough- none , dullness-none, rub- none           Chest wall-  Abd- Br/ Gen/ Rectal- Not done, not indicated Extrem- cyanosis- none, clubbing, none, atrophy- none, strength- nl Neuro- grossly intact to observation              Patient ID: Teresa Molina, female    DOB: 1946/02/28, 68 y.o.   MRN: [id number]  HPI - 36yoF followed here for allergic rhinitis/ conjunctivitis, and for insomnia with obstructive sleep apnea.  She has not tolerated CPAP well and has been educated on alternatives. Currently she has a trial set of Provent nasal valves and is still very tentative, but trying. Her tendency is toward "busy-brain" insomnia with difficulty initiating sleep. She is also interested in trying a Silent Night oral appliance offered by her  dentist.  She has not been able to lose weight.  For seasonal allergic irritation of eyes, watering and itching, she asks sample Pataday.  10/11/10- OSA, insomnia, allergic rhinitis Provent didn't work out well and she still felt she couldn't sleep throught the night. She is going to look into an oral appliance with her dentist.  Weight loss and sleep hygiene goals  reviewed. Spring associated nasal congestion and watery eyes are getting better.  12/13/10- 68yoF followed here for allergic rhinitis/ conjunctivitis, and for insomnia with obstructive sleep apnea, complicated by history of migraine, hypertension, GERD. She follows with Dr. Rex Kras for blood pressure management. He is evaluating recent description of aura with scheduled carotid Dopplers. Her primary sleep complaint remains difficulty initiating and maintaining sleep/insomnia. Obstructive sleep apnea has been identified and we have worked on it persistently. Unfortunately she has not tolerated CPAP or Provent nasal valves, and she has not been able to lose weight. Her dentist is interested in trying to make an oral appliance but apparently has limited experience with this. I have offered to send her to someone who has done a lot more of this kind of therapy. Restless legs have been identified in the past but does not seem to be a major clinical problem. Allergic rhinitis and conjunctivitis are managed symptomatically. Most recently she had [place].  02/21/11- 68yoF followed here for allergic rhinitis/ conjunctivitis, and for insomnia with obstructive sleep apnea, complicated by history of migraine, hypertension, GERD. Since last here in August she has had no more paresthesias. She has given up on CPAP and Provent. nasal valves. Insomnia remains her primary sleep complaint. We have discussed this repeatedly and tried a number of approaches. Basic sleep hygiene is good. Fortunately workup for abnormal breast exam turned out benign. In the last 2-3 weeks she feels fall seasonal allergies bothering her. Eyes itch, water. Ears itch. She has a sore inside her right nostril from blowing her nose a lot. Clear mucus discharge. Denies chest tightness or wheeze.  05/02/11- 68yoF followed here for allergic rhinitis/ conjunctivitis, and for insomnia with obstructive sleep apnea, complicated by history of migraine,  hypertension, GERD. Using clonazepam when needed for her insomnia. Failed to tolerate CPAP with multiple efforts, and the Provent nasal valves. Unable to lose weight. Lives alone with no reports of snoring or apnea. Did not get a dental oral device. Eyes have watered some, with little itch, sneeze or nasal congestion. Denies wheeze or cough.   07/04/11- 68yoF followed here for allergic rhinitis/ conjunctivitis, and for insomnia with obstructive sleep apnea/ failed CPAP, complicated by history of migraine, hypertension, GERD. Insomnia remains her primary sleep complaint. She is alone with no reports of snoring, witnessed apnea, or sleep disturbance by limb movements. We have discussed available treatments again. Seasonal rhinitis complaints are not yet the problem. We have reviewed symptomatic therapy is available if needed later in the spring. She is anticipating surgery for breast lump but does not know details yet. We do not anticipate any respiratory concerns that would affect that surgery.  09/05/11- 68yoF followed here for allergic rhinitis/ conjunctivitis, and for insomnia with obstructive sleep apnea/ failed CPAP, complicated by history of migraine, hypertension, GERD. Breast lump was a benign lipoma but she is not sure was adequately excised. Intermittent antihistamine. Never really sleeps well or without interruption. Pattern has not changed.  11/07/11- 68yoF followed here for allergic rhinitis/ conjunctivitis, and for insomnia with obstructive sleep apnea/ failed CPAP, complicated by history of migraine, hypertension, GERD. Pt still not  using cpap and would like something new. We talked again about available treatments including oral appliances, nasal valves. We feel it is time for reassessment and she should qualify for a home unattended sleep study. Her biggest sleep complaint has always been difficulty initiating and maintaining sleep, mostly with difficulty relaxing. I suggested formal  referral for a trial at Cognitive Behavioral Therapy. She is playing tennis and walking regularly with no acute problems.  01/11/12- 68 yoF never smoker followed here for allergic rhinitis/ conjunctivitis, and for insomnia with obstructive sleep apnea/ failed CPAP, complicated by history of migraine, hypertension, GERD. Her primary concern continues to be insomnia with difficulty initiating and maintaining sleep. She tried a wide Friday of interventions for sleep apnea but none improved her quality of life or ability to sleep well at night. 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 This creates additional stress, as do significant health problems in her family. We have discussed the impact on her sleep quality. She expresses intent to follow through with the cognitive behavioral therapy referral we had made. Allergy symptoms are generally controlled with OTC antihistamines now spring was the worst time of year.  03/12/12-  42 yoF never smoker followed here for allergic rhinitis/ conjunctivitis, and for insomnia with obstructive sleep apnea/ failed CPAP, complicated by history of migraine, hypertension, GERD. Still not sleeping well and would like to discuss her sleep med. Primary complaint is difficulty initiating and maintaining sleep. We have repeatedly reviewed sleep hygiene. Several medications tried as noted in record. She describes 2 hour sleep latency with Xanax 0.5 mg. Sleep soundly after she finally falls asleep. Lives alone. Stressful, rather political job with [employer].  04/21/12- 50 yoF never smoker followed here for allergic rhinitis/ conjunctivitis, and for insomnia with obstructive sleep apnea/ failed CPAP, complicated by history of migraine, hypertension, GERD. ACUTE VISIT: started last week with GI upset, sore throat on Wenesday/Thursday, lost voice, cough-productive, pressure/pain in Right ear. Denies any fever but has had chills.  Little loose phlegm, cough is mostly dry.. Taking supplemental vitamin D. Strep smear negative at her primary physician's office. Some chilling but no fever. GI now okay.  05/12/12  66 yoF never smoker followed here for allergic rhinitis/ conjunctivitis, and for insomnia with obstructive sleep apnea/ failed CPAP and Provent, complicated by history of migraine, hypertension, GERD. FOLLOWS FOR: occasional dry cough; denies any wheezing or SOB.  She started a Z-Pak yesterday we'll recent rhonchi does without fever. Did have flu shot. She asks Diflucan available because of antibiotic/yeast. We discussed insomnia management again. She is alone and not aware of snoring. I mentioned a book I had found which might help.  09/25/12-66 yoF never smoker followed here for allergic rhinitis/ conjunctivitis, and for insomnia with obstructive sleep apnea/ failed CPAP and Provent, complicated by history of migraine, hypertension, GERD. FOLLOWS FOR: watery eyes, slight cough, feels stuffy from pollen Chronic insomnia pattern is not really changed. She has been under some stress recently related to her job for the [employer]  12/03/12- 66 yoF never smoker followed here for allergic rhinitis/ conjunctivitis, and for insomnia with obstructive sleep apnea/ failed CPAP and Provent, complicated by history of migraine, hypertension, GERD. FOLLOWS FOR: having stuffiness at times; other wise no cough, SOB, wheezing, or congestion. Chronic insomnia pattern is up and down but persistent. Clonazepam 0.5 mg has help when used occasionally. Sleep apnea status is uncertain but not obtrusive. She lives alone. Treatments all  interfered with insomnia more than they helped. Occasional rhinitis, mostly treated OTC  02/06/13- 66 yoF never smoker followed here for allergic rhinitis/ conjunctivitis, and for insomnia with obstructive sleep apnea/ failed CPAP and Provent, complicated by history of migraine, hypertension,  GERD. FOLLOWS FOR: denies any wheezing, SOB, cough, or congestion. Still having leg jerks and just not sleeping She lives alone and admits she"doesn't like night time and doesn't want to go to sleep". We discussed her experience with meds and she is willing to try Xanax plus Amitriptyline at bedtime.  04/10/13- 66 yoF never smoker followed here for allergic rhinitis/ conjunctivitis, and for insomnia with obstructive sleep apnea/ failed CPAP and Provent, complicated by history of migraine, hypertension, GERD FOLLOWS FOR:  Breathing doing well.  Still having trouble sleeping ,4 hours per night.  Not using CPAP. She did not follow through with the cognitive behavioral therapy or oral appliance suggestions. We discussed medications and her lifestyle. She lives alone.  06/10/13- 66 yoF never smoker followed here for allergic rhinitis/ conjunctivitis, and for insomnia with obstructive sleep apnea/ failed CPAP and Provent, complicated by history of migraine, hypertension, GERD FOLLOWS FOR: sleeps for about 4.5 hours each night; still not using CPAP -can not stand it. She has tried CPAP and BiPAP, been educated on oral appliances and the importance of maintaining normal body weight. Her primary sleep complaint is insomnia and sleep apnea interventions have made that worse. She feels her sleep status is stable and she lives with it. Educated again on sleep hygiene. Bothered recently by watery rhinorrhea. Discussed antihistamines. Occasional soreness inside nostrils from wiping her nose, using Neosporin.  09/10/13- 67 yoF never smoker followed here for allergic rhinitis/ conjunctivitis, and for insomnia with obstructive sleep apnea/ failed CPAP and Provent, complicated by history of migraine, hypertension, GERD Minor seasonal stuffiness, no infection or chest discomfort. Antihistamines if needed. Sleep- bedtime 1:30-6:30 or 7:00AM. Admits tired. Discussed sleep hygiene.   12/31/13- 67 yoF never smoker followed  here for allergic rhinitis/ conjunctivitis, and for insomnia with obstructive sleep apnea/ failed CPAP and Provent, complicated by history of migraine, hypertension, GERD   03/03/14-  67 yoF never smoker followed here for allergic rhinitis/ conjunctivitis, and for insomnia with obstructive sleep apnea/ failed CPAP and Provent, complicated by history of migraine, hypertension, GERD FOLLOWS FOR: Patient still not wearing CPAP. She has cough for past 2 weeks. She is having greenish phlegm production sometime.  05/06/14- 67 yoF never smoker followed here for allergic rhinitis/ conjunctivitis, and for insomnia with obstructive sleep apnea/ failed CPAP and Provent, complicated by history of migraine, hypertension, GERD Intervals  with good sleep are still infrequent. Overall pattern has not changed No recent cough or wheeze  Review of Systems-see HPI Constitutional:   No-   weight loss, night sweats, fevers, chills, fatigue, lassitude. + insomnia HEENT:   No-  headaches, difficulty swallowing, tooth/dental problems, no-sore throat,       No- sneezing, itching, no-ears itch,+nasal congestion, post nasal drip,  CV:  No-   chest pain, orthopnea, PND, swelling in lower extremities, anasarca, dizziness, palpitations Resp: No-   shortness of breath with exertion or at rest.             No- productive cough,  No- non-productive cough,  No-  coughing up of blood.              No-   change in color of mucus.  No- wheezing.   Skin: No-   rash or lesions. GI:  No-   heartburn, indigestion, abdominal pain, nausea, vomiting,  GU: MS:  No-   joint pain or swelling.   Neuro- :  Psych:  No- change in mood or affect. Some- depression or anxiety.  No memory loss.   Physical Exam  General- Alert, Oriented, Affect-appropriate, very pleasant, Distress- none acute, overweight Skin- rash-none, lesions- none, excoriation- none Lymphadenopathy- none Head- atraumatic            Eyes- Gross vision intact, PERRLA,  conjunctivae clear secretions            Ears- Hearing, canals normal            Nose- clear , No- sores,  no -Septal dev, mucus, polyps, erosion, perforation.             Throat- Mallampati III , mucosa clear-not read , drainage- none, tonsils- atrophic   Neck- flexible , trachea midline, no stridor , thyroid nl, carotid no bruit Chest - symmetrical excursion , unlabored           Heart/CV- RRR , no murmur , no gallop  , no rub, nl s1 s2                           - JVD- none , edema- none, stasis changes- none, varices- none           Lung- clear to P&A, wheeze- none, no-cough  , dullness-none, rub- none           Chest wall-  Abd- Br/ Gen/ Rectal- Not done, not indicated Extrem- cyanosis- none, clubbing, none, atrophy- none, strength- nl Neuro- grossly intact to observation

## 2014-05-08 NOTE — Assessment & Plan Note (Signed)
She is past fall seasonal problems and nasal congestion is comfortable now

## 2014-05-08 NOTE — Assessment & Plan Note (Signed)
No recent recurrence after she finally cleared last episode

## 2014-05-08 NOTE — Assessment & Plan Note (Signed)
Always delays bedtime and admits that she tries to avoid going to bed until very late. She did not choose to explore an oral appliance

## 2014-06-23 ENCOUNTER — Ambulatory Visit (INDEPENDENT_AMBULATORY_CARE_PROVIDER_SITE_OTHER): Payer: Medicare Other | Admitting: Internal Medicine

## 2014-06-23 VITALS — BP 114/80 | HR 89 | Ht 62.0 in | Wt 173.0 lb

## 2014-06-23 DIAGNOSIS — J309 Allergic rhinitis, unspecified: Secondary | ICD-10-CM

## 2014-06-23 DIAGNOSIS — J209 Acute bronchitis, unspecified: Secondary | ICD-10-CM

## 2014-06-23 DIAGNOSIS — G4700 Insomnia, unspecified: Secondary | ICD-10-CM

## 2014-06-23 DIAGNOSIS — J302 Other seasonal allergic rhinitis: Secondary | ICD-10-CM

## 2014-06-23 DIAGNOSIS — J3089 Other allergic rhinitis: Principal | ICD-10-CM

## 2014-06-23 DIAGNOSIS — G4730 Sleep apnea, unspecified: Secondary | ICD-10-CM

## 2014-06-23 MED ORDER — BENZONATATE 200 MG PO CAPS: 200.0000 mg | ORAL_CAPSULE | Freq: Three times a day (TID) | ORAL | Status: DC | PRN

## 2014-06-23 MED ORDER — HYDROCODONE-HOMATROPINE 5-1.5 MG/5ML PO SYRP: 5.0000 mL | ORAL_SOLUTION | Freq: Four times a day (QID) | ORAL | Status: DC | PRN

## 2014-06-23 MED ORDER — AZITHROMYCIN 250 MG PO TABS: ORAL_TABLET | ORAL | Status: DC

## 2014-06-23 NOTE — Patient Instructions (Addendum)
Script sent for z pak  Scripts printed for benzonatate perles  And cough syrup  Ok to reschedule for 2 months

## 2014-06-23 NOTE — Progress Notes (Signed)
09/25/12-66 yoF never smoker followed here for allergic rhinitis/ conjunctivitis, and for insomnia with obstructive sleep apnea/ failed CPAP and Provent, complicated by history of migraine, hypertension, GERD. FOLLOWS FOR: watery eyes, slight cough, feels stuffy from pollen Chronic insomnia pattern is not really changed. She has been under some stress recently related to her job for the [employer]  12/03/12- 66 yoF never smoker followed here for allergic rhinitis/ conjunctivitis, and for insomnia with obstructive sleep apnea/ failed CPAP and Provent, complicated by history of migraine, hypertension, GERD. FOLLOWS FOR: having stuffiness at times; other wise no cough, SOB, wheezing, or congestion. Chronic insomnia pattern is up and down but persistent. Clonazepam 0.5 mg has help when used occasionally. Sleep apnea status is uncertain but not obtrusive. She lives alone. Treatments all interfered with insomnia more than they helped. Occasional rhinitis, mostly treated OTC  02/06/13- 66 yoF never smoker followed here for allergic rhinitis/ conjunctivitis, and for insomnia with obstructive sleep apnea/ failed CPAP and Provent, complicated by history of migraine, hypertension, GERD. FOLLOWS FOR: denies any wheezing, SOB, cough, or congestion. Still having leg jerks and just not sleeping She lives alone and admits she"doesn't like night time and doesn't want to go to sleep". We discussed her experience with meds and she is willing to try Xanax plus Amitriptyline at bedtime.  04/10/13- 66 yoF never smoker followed here for allergic rhinitis/ conjunctivitis, and for insomnia with obstructive sleep apnea/ failed CPAP and Provent, complicated by history of migraine, hypertension, GERD FOLLOWS FOR:  Breathing doing well.  Still having trouble sleeping ,4 hours per night.  Not using CPAP. She did not follow through with the cognitive behavioral therapy or oral appliance suggestions. We discussed  medications and her lifestyle. She lives alone.  06/10/13- 66 yoF never smoker followed here for allergic rhinitis/ conjunctivitis, and for insomnia with obstructive sleep apnea/ failed CPAP and Provent, complicated by history of migraine, hypertension, GERD FOLLOWS FOR: sleeps for about 4.5 hours each night; still not using CPAP -can not stand it. She has tried CPAP and BiPAP, been educated on oral appliances and the importance of maintaining normal body weight. Her primary sleep complaint is insomnia and sleep apnea interventions have made that worse. She feels her sleep status is stable and she lives with it. Educated again on sleep hygiene. Bothered recently by watery rhinorrhea. Discussed antihistamines. Occasional soreness inside nostrils from wiping her nose, using Neosporin.  09/10/13- 67 yoF never smoker followed here for allergic rhinitis/ conjunctivitis, and for insomnia with obstructive sleep apnea/ failed CPAP and Provent, complicated by history of migraine, hypertension, GERD Minor seasonal stuffiness, no infection or chest discomfort. Antihistamines if needed. Sleep- bedtime 1:30-6:30 or 7:00AM. Admits tired. Discussed sleep hygiene.   12/31/13- 67 yoF never smoker followed here for allergic rhinitis/ conjunctivitis, and for insomnia with obstructive sleep apnea/ failed CPAP and Provent, complicated by history of migraine, hypertension, GERD  03/03/14-  67 yoF never smoker followed here for allergic rhinitis/ conjunctivitis, and for insomnia with obstructive sleep apnea/ failed CPAP and Provent, complicated by history of migraine, hypertension, GERD FOLLOWS FOR: Patient still not wearing CPAP. She has cough for past 2 weeks. She is having greenish phlegm production sometime.  05/06/14- 67 yoF never smoker followed here for allergic rhinitis/ conjunctivitis, and for insomnia with obstructive sleep apnea/ failed CPAP and Provent, complicated by history of migraine, hypertension,  GERD Intervals  with good sleep are still infrequent. Overall pattern has not changed No recent cough or wheeze  2/10//16-  63 yoF never smoker followed here for allergic rhinitis/ conjunctivitis, and for insomnia with obstructive sleep apnea/ failed CPAP and Provent, complicated by history of migraine, hypertension, GERD Acute visit-sick for 5 days with sore throat, malaise, painful dry cough, scant green sputum, hoarseness. Had flu shot. Myalgias. No fever or blood and no swollen glands.  Review of Systems-see HPI Constitutional:   No-   weight loss, night sweats, fevers, chills, fatigue, lassitude. + insomnia HEENT:   No-  headaches, difficulty swallowing, tooth/dental problems, no-sore throat,       No- sneezing, itching, no-ears itch,+nasal congestion, post nasal drip,  CV:  No-   chest pain, orthopnea, PND, swelling in lower extremities, anasarca, dizziness, palpitations Resp: No-   shortness of breath with exertion or at rest.             +productive cough,  No- non-productive cough,  No-  coughing up of blood.              No-   change in color of mucus.  No- wheezing.   Skin: No-   rash or lesions. GI:  No-   heartburn, indigestion, abdominal pain, nausea, vomiting,  GU: MS:  No-   joint pain or swelling.   Neuro- :  Psych:  No- change in mood or affect. Some- depression or anxiety.  No memory loss.   Physical Exam  General- Alert, Oriented, Affect-appropriate, very pleasant, doesn't feel well  overweight Skin- rash-none, lesions- none, excoriation- none Lymphadenopathy- none Head- atraumatic            Eyes- Gross vision intact, PERRLA, conjunctivae clear secretions            Ears- Hearing, canals normal            Nose- clear , No- sores,  no -Septal dev, mucus, polyps, erosion, perforation.             Throat- Mallampati III , mucosa clear-not read , drainage- none, tonsils- atrophic   Neck- flexible , trachea midline, no stridor , thyroid nl, carotid no bruit Chest -  symmetrical excursion , unlabored           Heart/CV- RRR , no murmur , no gallop  , no rub, nl s1 s2                           - JVD- none , edema- none, stasis changes- none, varices- none           Lung- clear to P&A, wheeze- none, no-cough  , dullness-none, rub- none           Chest wall-  Abd- Br/ Gen/ Rectal- Not done, not indicated Extrem- cyanosis- none, clubbing, none, atrophy- none, strength- nl Neuro- grossly intact to observation

## 2014-06-26 ENCOUNTER — Encounter: Payer: Self-pay | Admitting: Internal Medicine

## 2014-06-26 NOTE — Assessment & Plan Note (Signed)
Malaise and acute increase in cough with sore throat and nonspecific tussive chest soreness. Plan-fluids, Hydromet cough syrup, Z-Pak, avoid fatigue and chills

## 2014-06-26 NOTE — Assessment & Plan Note (Signed)
She doesn't feel well enough to complain about her sleep currently. She may be doing slightly better, admitting that the worst of her insomnia comes and goes.

## 2014-06-26 NOTE — Assessment & Plan Note (Signed)
Managed seasonally with antihistamines

## 2014-07-09 ENCOUNTER — Ambulatory Visit (INDEPENDENT_AMBULATORY_CARE_PROVIDER_SITE_OTHER): Payer: Medicare Other | Admitting: Internal Medicine

## 2014-07-09 ENCOUNTER — Encounter: Payer: Self-pay | Admitting: Internal Medicine

## 2014-07-09 VITALS — BP 116/70 | HR 71 | Ht 62.0 in | Wt 177.8 lb

## 2014-07-09 DIAGNOSIS — G4730 Sleep apnea, unspecified: Secondary | ICD-10-CM

## 2014-07-09 DIAGNOSIS — G4700 Insomnia, unspecified: Secondary | ICD-10-CM

## 2014-07-09 DIAGNOSIS — J209 Acute bronchitis, unspecified: Secondary | ICD-10-CM

## 2014-07-09 MED ORDER — AZITHROMYCIN 250 MG PO TABS: ORAL_TABLET | ORAL | Status: DC

## 2014-07-09 NOTE — Patient Instructions (Signed)
Script sent for Zpak  Please call as needed

## 2014-07-09 NOTE — Progress Notes (Signed)
09[birth year]-66 yoF never smoker followed here for allergic rhinitis/ conjunctivitis, and for insomnia with obstructive sleep apnea/ failed CPAP and Provent, complicated by history of migraine, hypertension, GERD. FOLLOWS FOR: watery eyes, slight cough, feels stuffy from pollen Chronic insomnia pattern is not really changed. She has been under some stress recently related to her job for the [employer]  12/03/12- 66 yoF never smoker followed here for allergic rhinitis/ conjunctivitis, and for insomnia with obstructive sleep apnea/ failed CPAP and Provent, complicated by history of migraine, hypertension, GERD. FOLLOWS FOR: having stuffiness at times; other wise no cough, SOB, wheezing, or congestion. Chronic insomnia pattern is up and down but persistent. Clonazepam 0.5 mg has help when used occasionally. Sleep apnea status is uncertain but not obtrusive. She lives alone. Treatments all interfered with insomnia more than they helped. Occasional rhinitis, mostly treated OTC  02/06/13- 66 yoF never smoker followed here for allergic rhinitis/ conjunctivitis, and for insomnia with obstructive sleep apnea/ failed CPAP and Provent, complicated by history of migraine, hypertension, GERD. FOLLOWS FOR: denies any wheezing, SOB, cough, or congestion. Still having leg jerks and just not sleeping She lives alone and admits she"doesn't like night time and doesn't want to go to sleep". We discussed her experience with meds and she is willing to try Xanax plus Amitriptyline at bedtime.  04/10/13- 66 yoF never smoker followed here for allergic rhinitis/ conjunctivitis, and for insomnia with obstructive sleep apnea/ failed CPAP and Provent, complicated by history of migraine, hypertension, GERD FOLLOWS FOR:  Breathing doing well.  Still having trouble sleeping ,4 hours per night.  Not using CPAP. She did not follow through with the cognitive behavioral therapy or oral appliance suggestions. We discussed  medications and her lifestyle. She lives alone.  06/10/13- 66 yoF never smoker followed here for allergic rhinitis/ conjunctivitis, and for insomnia with obstructive sleep apnea/ failed CPAP and Provent, complicated by history of migraine, hypertension, GERD FOLLOWS FOR: sleeps for about 4.5 hours each night; still not using CPAP -can not stand it. She has tried CPAP and BiPAP, been educated on oral appliances and the importance of maintaining normal body weight. Her primary sleep complaint is insomnia and sleep apnea interventions have made that worse. She feels her sleep status is stable and she lives with it. Educated again on sleep hygiene. Bothered recently by watery rhinorrhea. Discussed antihistamines. Occasional soreness inside nostrils from wiping her nose, using Neosporin.  09/10/13- 67 yoF never smoker followed here for allergic rhinitis/ conjunctivitis, and for insomnia with obstructive sleep apnea/ failed CPAP and Provent, complicated by history of migraine, hypertension, GERD Minor seasonal stuffiness, no infection or chest discomfort. Antihistamines if needed. Sleep- bedtime 1:30-6:30 or 7:00AM. Admits tired. Discussed sleep hygiene.   12/31/13- 67 yoF never smoker followed here for allergic rhinitis/ conjunctivitis, and for insomnia with obstructive sleep apnea/ failed CPAP and Provent, complicated by history of migraine, hypertension, GERD  03/03/14-  67 yoF never smoker followed here for allergic rhinitis/ conjunctivitis, and for insomnia with obstructive sleep apnea/ failed CPAP and Provent, complicated by history of migraine, hypertension, GERD FOLLOWS FOR: Patient still not wearing CPAP. She has cough for past 2 weeks. She is having greenish phlegm production sometime.  05/06/14- 67 yoF never smoker followed here for allergic rhinitis/ conjunctivitis, and for insomnia with obstructive sleep apnea/ failed CPAP and Provent, complicated by history of migraine, hypertension,  GERD Intervals  with good sleep are still infrequent. Overall pattern has not changed No recent cough or wheeze  2/10//16-  62 yoF never smoker followed here for allergic rhinitis/ conjunctivitis, and for insomnia with obstructive sleep apnea/ failed CPAP and Provent, complicated by history of migraine, hypertension, GERD Acute visit-sick for 5 days with sore throat, malaise, painful dry cough, scant green sputum, hoarseness. Had flu shot. Myalgias. No fever or blood and no swollen glands.  07/09/14- 68 yoF never smoker followed here for allergic rhinitis/ conjunctivitis, and for insomnia with obstructive sleep apnea/ failed CPAP and Provent, complicated by history of migraine, hypertension, GERD FOLLOWS FOR: Continues to have cough and raspy voice but feels much better. Slowly resolving bronchitis. Z-Pak helped some but she asks about a repeat. Sputum is clear or, no fever.  Review of Systems-see HPI Constitutional:   No-   weight loss, night sweats, fevers, chills, fatigue, lassitude. + insomnia HEENT:   No-  headaches, difficulty swallowing, tooth/dental problems, no-sore throat,       No- sneezing, itching, no-ears itch,+nasal congestion, post nasal drip,  CV:  No-   chest pain, orthopnea, PND, swelling in lower extremities, anasarca, dizziness, palpitations Resp: No-   shortness of breath with exertion or at rest.             +productive cough, + non-productive cough,  No-  coughing up of blood.              No-   change in color of mucus.  No- wheezing.   Skin: No-   rash or lesions. GI:  No-   heartburn, indigestion, abdominal pain, nausea, vomiting,  GU: MS:  No-   joint pain or swelling.   Neuro- :  Psych:  No- change in mood or affect. Some- depression or anxiety.  No memory loss.   Physical Exam  General- Alert, Oriented, Affect-appropriate, very pleasant,   overweight Skin- rash-none, lesions- none, excoriation- none Lymphadenopathy- none Head- atraumatic            Eyes-  Gross vision intact, PERRLA, conjunctivae clear secretions            Ears- Hearing, canals normal            Nose- clear , No- sores,  no -Septal dev, mucus, polyps, erosion, perforation.             Throat- Mallampati III , mucosa clear-not read , drainage- none, tonsils- atrophic   Neck- flexible , trachea midline, no stridor , thyroid nl, carotid no bruit Chest - symmetrical excursion , unlabored           Heart/CV- RRR , no murmur , no gallop  , no rub, nl s1 s2                           - JVD- none , edema- none, stasis changes- none, varices- none           Lung- clear to P&A, wheeze- none, cough+  , dullness-none, rub- none           Chest wall-  Abd- Br/ Gen/ Rectal- Not done, not indicated Extrem- cyanosis- none, clubbing, none, atrophy- none, strength- nl Neuro- grossly intact to observation

## 2014-07-10 NOTE — Assessment & Plan Note (Signed)
Slowly resolving Plan-repeat Z-Pak

## 2014-07-10 NOTE — Assessment & Plan Note (Signed)
She sleep some nights better than others but has not settled on an intervention that consistently helps her. Sleep hygiene education has been done. She has failed efforts to address mild obstructive sleep apnea.

## 2014-09-09 ENCOUNTER — Ambulatory Visit (INDEPENDENT_AMBULATORY_CARE_PROVIDER_SITE_OTHER): Payer: Medicare Other | Admitting: Internal Medicine

## 2014-09-09 ENCOUNTER — Encounter: Payer: Self-pay | Admitting: Internal Medicine

## 2014-09-09 VITALS — BP 110/64 | HR 63 | Ht 62.0 in | Wt 175.0 lb

## 2014-09-09 DIAGNOSIS — J302 Other seasonal allergic rhinitis: Secondary | ICD-10-CM

## 2014-09-09 MED ORDER — SUVOREXANT 10 MG PO TABS: 1.0000 | ORAL_TABLET | Freq: Every day | ORAL | Status: DC

## 2014-09-09 MED ORDER — SUVOREXANT 15 MG PO TABS: 1.0000 | ORAL_TABLET | Freq: Every day | ORAL | Status: DC

## 2014-09-09 NOTE — Patient Instructions (Signed)
Samples Belsomra 10 mg and 15 mg 1 daily at bed time   Suggest adding melatonin 6-12 mg at bedtime

## 2014-09-09 NOTE — Progress Notes (Signed)
[birth year]66 yoF never smoker followed here for allergic rhinitis/ conjunctivitis, and for insomnia with obstructive sleep apnea/ failed CPAP and Provent, complicated by history of migraine, hypertension, GERD. FOLLOWS FOR: watery eyes, slight cough, feels stuffy from pollen Chronic insomnia pattern is not really changed. She has been under some stress recently related to her job for the [employer]  12/03/12- 66 yoF never smoker followed here for allergic rhinitis/ conjunctivitis, and for insomnia with obstructive sleep apnea/ failed CPAP and Provent, complicated by history of migraine, hypertension, GERD. FOLLOWS FOR: having stuffiness at times; other wise no cough, SOB, wheezing, or congestion. Chronic insomnia pattern is up and down but persistent. Clonazepam 0.5 mg has help when used occasionally. Sleep apnea status is uncertain but not obtrusive. She lives alone. Treatments all interfered with insomnia more than they helped. Occasional rhinitis, mostly treated OTC  02/06/13- 66 yoF never smoker followed here for allergic rhinitis/ conjunctivitis, and for insomnia with obstructive sleep apnea/ failed CPAP and Provent, complicated by history of migraine, hypertension, GERD. FOLLOWS FOR: denies any wheezing, SOB, cough, or congestion. Still having leg jerks and just not sleeping She lives alone and admits she"doesn't like night time and doesn't want to go to sleep". We discussed her experience with meds and she is willing to try Xanax plus Amitriptyline at bedtime.  04/10/13- 66 yoF never smoker followed here for allergic rhinitis/ conjunctivitis, and for insomnia with obstructive sleep apnea/ failed CPAP and Provent, complicated by history of migraine, hypertension, GERD FOLLOWS FOR:  Breathing doing well.  Still having trouble sleeping ,4 hours per night.  Not using CPAP. She did not follow through with the cognitive behavioral therapy or oral appliance suggestions. We discussed  medications and her lifestyle. She lives alone.  06/10/13- 66 yoF never smoker followed here for allergic rhinitis/ conjunctivitis, and for insomnia with obstructive sleep apnea/ failed CPAP and Provent, complicated by history of migraine, hypertension, GERD FOLLOWS FOR: sleeps for about 4.5 hours each night; still not using CPAP -can not stand it. She has tried CPAP and BiPAP, been educated on oral appliances and the importance of maintaining normal body weight. Her primary sleep complaint is insomnia and sleep apnea interventions have made that worse. She feels her sleep status is stable and she lives with it. Educated again on sleep hygiene. Bothered recently by watery rhinorrhea. Discussed antihistamines. Occasional soreness inside nostrils from wiping her nose, using Neosporin.  09/10/13- 67 yoF never smoker followed here for allergic rhinitis/ conjunctivitis, and for insomnia with obstructive sleep apnea/ failed CPAP and Provent, complicated by history of migraine, hypertension, GERD Minor seasonal stuffiness, no infection or chest discomfort. Antihistamines if needed. Sleep- bedtime 1:30-6:30 or 7:00AM. Admits tired. Discussed sleep hygiene.   12/31/13- 67 yoF never smoker followed here for allergic rhinitis/ conjunctivitis, and for insomnia with obstructive sleep apnea/ failed CPAP and Provent, complicated by history of migraine, hypertension, GERD  03/03/14-  67 yoF never smoker followed here for allergic rhinitis/ conjunctivitis, and for insomnia with obstructive sleep apnea/ failed CPAP and Provent, complicated by history of migraine, hypertension, GERD FOLLOWS FOR: Patient still not wearing CPAP. She has cough for past 2 weeks. She is having greenish phlegm production sometime.  05/06/14- 67 yoF never smoker followed here for allergic rhinitis/ conjunctivitis, and for insomnia with obstructive sleep apnea/ failed CPAP and Provent, complicated by history of migraine, hypertension,  GERD Intervals  with good sleep are still infrequent. Overall pattern has not changed No recent cough or wheeze  2/10//16-  89 yoF never smoker followed here for allergic rhinitis/ conjunctivitis, and for insomnia with obstructive sleep apnea/ failed CPAP and Provent, complicated by history of migraine, hypertension, GERD Acute visit-sick for 5 days with sore throat, malaise, painful dry cough, scant green sputum, hoarseness. Had flu shot. Myalgias. No fever or blood and no swollen glands.  07/09/14- 68 yoF never smoker followed here for allergic rhinitis/ conjunctivitis, and for insomnia with obstructive sleep apnea/ failed CPAP and Provent, complicated by history of migraine, hypertension, GERD FOLLOWS FOR: Continues to have cough and raspy voice but feels much better. Slowly resolving bronchitis. Z-Pak helped some but she asks about a repeat. Sputum is clear or, no fever.  09/09/14- 68 yoF never smoker followed here for allergic rhinitis/ conjunctivitis, and for insomnia with obstructive sleep apnea/ failed CPAP and Provent, complicated by history of migraine, hypertension, GERD FOLLOWS FOR: Not sleeping well,sleepy.     Review of Systems-see HPI Constitutional:   No-   weight loss, night sweats, fevers, chills, fatigue, lassitude. + insomnia HEENT:   No-  headaches, difficulty swallowing, tooth/dental problems, no-sore throat,       No- sneezing, itching, no-ears itch,+nasal congestion, post nasal drip,  CV:  No-   chest pain, orthopnea, PND, swelling in lower extremities, anasarca, dizziness, palpitations Resp: No-   shortness of breath with exertion or at rest.             +productive cough, + non-productive cough,  No-  coughing up of blood.              No-   change in color of mucus.  No- wheezing.   Skin: No-   rash or lesions. GI:  No-   heartburn, indigestion, abdominal pain, nausea, vomiting,  GU: MS:  No-   joint pain or swelling.   Neuro- :  Psych:  No- change in mood or  affect. Some- depression or anxiety.  No memory loss.   Physical Exam  General- Alert, Oriented, Affect-appropriate, very pleasant,   overweight Skin- rash-none, lesions- none, excoriation- none Lymphadenopathy- none Head- atraumatic            Eyes- Gross vision intact, PERRLA, conjunctivae clear secretions            Ears- Hearing, canals normal            Nose- clear , No- sores,  no -Septal dev, mucus, polyps, erosion, perforation.             Throat- Mallampati III , mucosa clear-not read , drainage- none, tonsils- atrophic   Neck- flexible , trachea midline, no stridor , thyroid nl, carotid no bruit Chest - symmetrical excursion , unlabored           Heart/CV- RRR , no murmur , no gallop  , no rub, nl s1 s2                           - JVD- none , edema- none, stasis changes- none, varices- none           Lung- clear to P&A, wheeze- none, cough+  , dullness-none, rub- none           Chest wall-  Abd- Br/ Gen/ Rectal- Not done, not indicated Extrem- cyanosis- none, clubbing, none, atrophy- none, strength- nl Neuro- grossly intact to observation

## 2014-10-13 ENCOUNTER — Ambulatory Visit: Payer: Medicare Other | Admitting: Internal Medicine

## 2014-11-08 ENCOUNTER — Other Ambulatory Visit: Payer: Self-pay

## 2014-11-29 ENCOUNTER — Encounter: Payer: Self-pay | Admitting: *Deleted

## 2014-12-27 ENCOUNTER — Ambulatory Visit: Payer: Medicare Other | Admitting: Internal Medicine

## 2014-12-29 ENCOUNTER — Ambulatory Visit (INDEPENDENT_AMBULATORY_CARE_PROVIDER_SITE_OTHER): Payer: Medicare Other | Admitting: Internal Medicine

## 2014-12-29 ENCOUNTER — Encounter: Payer: Self-pay | Admitting: Internal Medicine

## 2014-12-29 VITALS — BP 128/76 | HR 74 | Ht 62.0 in | Wt 163.4 lb

## 2014-12-29 DIAGNOSIS — G4730 Sleep apnea, unspecified: Secondary | ICD-10-CM | POA: Diagnosis not present

## 2014-12-29 DIAGNOSIS — G4700 Insomnia, unspecified: Secondary | ICD-10-CM

## 2014-12-29 DIAGNOSIS — J209 Acute bronchitis, unspecified: Secondary | ICD-10-CM

## 2014-12-29 MED ORDER — AZITHROMYCIN 250 MG PO TABS: ORAL_TABLET | ORAL | Status: DC

## 2014-12-29 NOTE — Assessment & Plan Note (Signed)
She has  lost down from 183 pounds to 163 in the last 8 months, which she attributes to diet. She is working with GI/Dr. Oletta Lamas and with her primary physician for general care.

## 2014-12-29 NOTE — Progress Notes (Signed)
[birth year]66 yoF never smoker followed here for allergic rhinitis/ conjunctivitis, and for insomnia with obstructive sleep apnea/ failed CPAP and Provent, complicated by history of migraine, hypertension, GERD. FOLLOWS FOR: watery eyes, slight cough, feels stuffy from pollen Chronic insomnia pattern is not really changed. She has been under some stress recently related to her job for the [employer]  12/03/12- 66 yoF never smoker followed here for allergic rhinitis/ conjunctivitis, and for insomnia with obstructive sleep apnea/ failed CPAP and Provent, complicated by history of migraine, hypertension, GERD. FOLLOWS FOR: having stuffiness at times; other wise no cough, SOB, wheezing, or congestion. Chronic insomnia pattern is up and down but persistent. Clonazepam 0.5 mg has help when used occasionally. Sleep apnea status is uncertain but not obtrusive. She lives alone. Treatments all interfered with insomnia more than they helped. Occasional rhinitis, mostly treated OTC  02/06/13- 66 yoF never smoker followed here for allergic rhinitis/ conjunctivitis, and for insomnia with obstructive sleep apnea/ failed CPAP and Provent, complicated by history of migraine, hypertension, GERD. FOLLOWS FOR: denies any wheezing, SOB, cough, or congestion. Still having leg jerks and just not sleeping She lives alone and admits she"doesn't like night time and doesn't want to go to sleep". We discussed her experience with meds and she is willing to try Xanax plus Amitriptyline at bedtime.  04/10/13- 66 yoF never smoker followed here for allergic rhinitis/ conjunctivitis, and for insomnia with obstructive sleep apnea/ failed CPAP and Provent, complicated by history of migraine, hypertension, GERD FOLLOWS FOR:  Breathing doing well.  Still having trouble sleeping ,4 hours per night.  Not using CPAP. She did not follow through with the cognitive behavioral therapy or oral appliance suggestions. We discussed  medications and her lifestyle. She lives alone.  06/10/13- 66 yoF never smoker followed here for allergic rhinitis/ conjunctivitis, and for insomnia with obstructive sleep apnea/ failed CPAP and Provent, complicated by history of migraine, hypertension, GERD FOLLOWS FOR: sleeps for about 4.5 hours each night; still not using CPAP -can not stand it. She has tried CPAP and BiPAP, been educated on oral appliances and the importance of maintaining normal body weight. Her primary sleep complaint is insomnia and sleep apnea interventions have made that worse. She feels her sleep status is stable and she lives with it. Educated again on sleep hygiene. Bothered recently by watery rhinorrhea. Discussed antihistamines. Occasional soreness inside nostrils from wiping her nose, using Neosporin.  09/10/13- 67 yoF never smoker followed here for allergic rhinitis/ conjunctivitis, and for insomnia with obstructive sleep apnea/ failed CPAP and Provent, complicated by history of migraine, hypertension, GERD Minor seasonal stuffiness, no infection or chest discomfort. Antihistamines if needed. Sleep- bedtime 1:30-6:30 or 7:00AM. Admits tired. Discussed sleep hygiene.   12/31/13- 67 yoF never smoker followed here for allergic rhinitis/ conjunctivitis, and for insomnia with obstructive sleep apnea/ failed CPAP and Provent, complicated by history of migraine, hypertension, GERD  03/03/14-  67 yoF never smoker followed here for allergic rhinitis/ conjunctivitis, and for insomnia with obstructive sleep apnea/ failed CPAP and Provent, complicated by history of migraine, hypertension, GERD FOLLOWS FOR: Patient still not wearing CPAP. She has cough for past 2 weeks. She is having greenish phlegm production sometime.  05/06/14- 67 yoF never smoker followed here for allergic rhinitis/ conjunctivitis, and for insomnia with obstructive sleep apnea/ failed CPAP and Provent, complicated by history of migraine, hypertension,  GERD Intervals  with good sleep are still infrequent. Overall pattern has not changed No recent cough or wheeze  2/10//16-  66 yoF never smoker followed here for allergic rhinitis/ conjunctivitis, and for insomnia with obstructive sleep apnea/ failed CPAP and Provent, complicated by history of migraine, hypertension, GERD, rectal bleeding Acute visit-sick for 5 days with sore throat, malaise, painful dry cough, scant green sputum, hoarseness. Had flu shot. Myalgias. No fever or blood and no swollen glands.  07/09/14- 68 yoF never smoker followed here for allergic rhinitis/ conjunctivitis, and for insomnia with obstructive sleep apnea/ failed CPAP and Provent, complicated by history of migraine, hypertension, GERD FOLLOWS FOR: Continues to have cough and raspy voice but feels much better. Slowly resolving bronchitis. Z-Pak helped some but she asks about a repeat. Sputum is clear or, no fever.  09/09/14- 68 yoF never smoker followed here for allergic rhinitis/ conjunctivitis, and for insomnia with obstructive sleep apnea/ failed CPAP and Provent, complicated by history of migraine, hypertension, GERD FOLLOWS FOR: Not sleeping well,sleepy.  12/29/14- 68 yoF never smoker followed here for allergic rhinitis/ conjunctivitis, and for insomnia with obstructive sleep apnea/ failed CPAP and Provent, complicated by history of migraine, hypertension, GERD FOLLOWS FOR: Pt states she is not feeling well; head hurts, chest hurts, ear pain, sore throat, ? fevers.  Has been working hard for [employer] in this election season and has gotten acute infection. Began as an upper respiratory infection with headache, earache, sore throat then chest congestion. Sputum now turning green, low-grade fever, increased cough. Evaluated by Dr. Charolotte Capuchin for acute rectal bleeding attributed to internal hemorrhoids. Dieting weight down for blood sugar. She has not yet tried Belsomra for sleep, waiting to feel better.  Review  of Systems-see HPI Constitutional:   +  weight loss, night sweats, fevers, chills, fatigue, lassitude. + insomnia HEENT:   +headaches, difficulty swallowing, tooth/dental problems, +sore throat,       No- sneezing, itching, no-ears itch,+nasal congestion, post nasal drip,  CV:  No-   chest pain, orthopnea, PND, swelling in lower extremities, anasarca, dizziness, palpitations Resp: No-   shortness of breath with exertion or at rest.             +productive cough, + non-productive cough,  No-  coughing up of blood.              No-   change in color of mucus.  No- wheezing.   Skin: No-   rash or lesions. GI:  No-   heartburn, indigestion, abdominal pain, nausea, vomiting,  GU: MS:  No-   joint pain or swelling.   Neuro- :  Psych:  No- change in mood or affect. Some- depression or anxiety.  No memory loss.   Physical Exam  General- Alert, Oriented, Affect-appropriate, very pleasant,   overweight Skin- rash-none, lesions- none, excoriation- none Lymphadenopathy- none Head- atraumatic            Eyes- Gross vision intact, PERRLA, conjunctivae clear secretions            Ears- Hearing, canals normal            Nose- clear , No- sores,  no -Septal dev, mucus, polyps, erosion, perforation.             Throat- Mallampati III , mucosa clear-not read , drainage- none, tonsils- atrophic , + hoarse  Neck- flexible , trachea midline, no stridor , thyroid nl, carotid no bruit Chest - symmetrical excursion , unlabored           Heart/CV- RRR , no murmur , no gallop  , no rub, nl  s1 s2                           - JVD- none , edema- none, stasis changes- none, varices- none           Lung- + few rattles, wheeze- none, cough+ light , dullness-none, rub- none           Chest wall-  Abd- Br/ Gen/ Rectal- Not done, not indicated Extrem- cyanosis- none, clubbing, none, atrophy- none, strength- nl Neuro- grossly intact to observation

## 2014-12-29 NOTE — Assessment & Plan Note (Signed)
She does intend to give Belsomra try. She gets over her acute respiratory infection.

## 2014-12-29 NOTE — Patient Instructions (Signed)
Script sent for Z pak  Fluids and rest and comfort care

## 2014-12-29 NOTE — Assessment & Plan Note (Signed)
Acute upper respiratory infection with acute bronchitis Plan-Z-Pak, fluids, supportive care

## 2014-12-31 ENCOUNTER — Telehealth: Payer: Self-pay | Admitting: Internal Medicine

## 2014-12-31 MED ORDER — BENZONATATE 200 MG PO CAPS: 200.0000 mg | ORAL_CAPSULE | Freq: Three times a day (TID) | ORAL | Status: DC | PRN

## 2014-12-31 NOTE — Telephone Encounter (Signed)
Asks cough med for her acute bronchitis. Listed codeine intolerant. Plan- tessalon perles sent to drug store

## 2015-01-11 ENCOUNTER — Encounter: Payer: Self-pay | Admitting: Internal Medicine

## 2015-03-03 ENCOUNTER — Ambulatory Visit (INDEPENDENT_AMBULATORY_CARE_PROVIDER_SITE_OTHER): Payer: Medicare Other | Admitting: Internal Medicine

## 2015-03-03 ENCOUNTER — Encounter: Payer: Self-pay | Admitting: Internal Medicine

## 2015-03-03 VITALS — BP 122/76 | HR 64 | Ht 62.0 in | Wt 160.2 lb

## 2015-03-03 DIAGNOSIS — Z23 Encounter for immunization: Secondary | ICD-10-CM

## 2015-03-03 DIAGNOSIS — G4730 Sleep apnea, unspecified: Secondary | ICD-10-CM | POA: Diagnosis not present

## 2015-03-03 DIAGNOSIS — G4700 Insomnia, unspecified: Secondary | ICD-10-CM

## 2015-03-03 DIAGNOSIS — J309 Allergic rhinitis, unspecified: Secondary | ICD-10-CM

## 2015-03-03 DIAGNOSIS — J3089 Other allergic rhinitis: Secondary | ICD-10-CM

## 2015-03-03 DIAGNOSIS — J302 Other seasonal allergic rhinitis: Secondary | ICD-10-CM

## 2015-03-03 NOTE — Progress Notes (Signed)
09[birth year]-66 yoF never smoker followed here for allergic rhinitis/ conjunctivitis, and for insomnia with obstructive sleep apnea/ failed CPAP and Provent, complicated by history of migraine, hypertension, GERD. FOLLOWS FOR: watery eyes, slight cough, feels stuffy from pollen Chronic insomnia pattern is not really changed. She has been under some stress recently related to her job for the [employer]  12/03/12- 66 yoF never smoker followed here for allergic rhinitis/ conjunctivitis, and for insomnia with obstructive sleep apnea/ failed CPAP and Provent, complicated by history of migraine, hypertension, GERD. FOLLOWS FOR: having stuffiness at times; other wise no cough, SOB, wheezing, or congestion. Chronic insomnia pattern is up and down but persistent. Clonazepam 0.5 mg has help when used occasionally. Sleep apnea status is uncertain but not obtrusive. She lives alone. Treatments all interfered with insomnia more than they helped. Occasional rhinitis, mostly treated OTC  02/06/13- 66 yoF never smoker followed here for allergic rhinitis/ conjunctivitis, and for insomnia with obstructive sleep apnea/ failed CPAP and Provent, complicated by history of migraine, hypertension, GERD. FOLLOWS FOR: denies any wheezing, SOB, cough, or congestion. Still having leg jerks and just not sleeping She lives alone and admits she"doesn't like night time and doesn't want to go to sleep". We discussed her experience with meds and she is willing to try Xanax plus Amitriptyline at bedtime.  04/10/13- 66 yoF never smoker followed here for allergic rhinitis/ conjunctivitis, and for insomnia with obstructive sleep apnea/ failed CPAP and Provent, complicated by history of migraine, hypertension, GERD FOLLOWS FOR:  Breathing doing well.  Still having trouble sleeping ,4 hours per night.  Not using CPAP. She did not follow through with the cognitive behavioral therapy or oral appliance suggestions. We discussed  medications and her lifestyle. She lives alone.  06/10/13- 66 yoF never smoker followed here for allergic rhinitis/ conjunctivitis, and for insomnia with obstructive sleep apnea/ failed CPAP and Provent, complicated by history of migraine, hypertension, GERD FOLLOWS FOR: sleeps for about 4.5 hours each night; still not using CPAP -can not stand it. She has tried CPAP and BiPAP, been educated on oral appliances and the importance of maintaining normal body weight. Her primary sleep complaint is insomnia and sleep apnea interventions have made that worse. She feels her sleep status is stable and she lives with it. Educated again on sleep hygiene. Bothered recently by watery rhinorrhea. Discussed antihistamines. Occasional soreness inside nostrils from wiping her nose, using Neosporin.  09/10/13- 67 yoF never smoker followed here for allergic rhinitis/ conjunctivitis, and for insomnia with obstructive sleep apnea/ failed CPAP and Provent, complicated by history of migraine, hypertension, GERD Minor seasonal stuffiness, no infection or chest discomfort. Antihistamines if needed. Sleep- bedtime 1:30-6:30 or 7:00AM. Admits tired. Discussed sleep hygiene.   12/31/13- 67 yoF never smoker followed here for allergic rhinitis/ conjunctivitis, and for insomnia with obstructive sleep apnea/ failed CPAP and Provent, complicated by history of migraine, hypertension, GERD  03/03/14-  67 yoF never smoker followed here for allergic rhinitis/ conjunctivitis, and for insomnia with obstructive sleep apnea/ failed CPAP and Provent, complicated by history of migraine, hypertension, GERD FOLLOWS FOR: Patient still not wearing CPAP. She has cough for past 2 weeks. She is having greenish phlegm production sometime.  05/06/14- 67 yoF never smoker followed here for allergic rhinitis/ conjunctivitis, and for insomnia with obstructive sleep apnea/ failed CPAP and Provent, complicated by history of migraine, hypertension,  GERD Intervals  with good sleep are still infrequent. Overall pattern has not changed No recent cough or wheeze  2/10//16-  88 yoF never smoker followed here for allergic rhinitis/ conjunctivitis, and for insomnia with obstructive sleep apnea/ failed CPAP and Provent, complicated by history of migraine, hypertension, GERD, rectal bleeding Acute visit-sick for 5 days with sore throat, malaise, painful dry cough, scant green sputum, hoarseness. Had flu shot. Myalgias. No fever or blood and no swollen glands.  07/09/14- 68 yoF never smoker followed here for allergic rhinitis/ conjunctivitis, and for insomnia with obstructive sleep apnea/ failed CPAP and Provent, complicated by history of migraine, hypertension, GERD FOLLOWS FOR: Continues to have cough and raspy voice but feels much better. Slowly resolving bronchitis. Z-Pak helped some but she asks about a repeat. Sputum is clear or, no fever.  09/09/14- 68 yoF never smoker followed here for allergic rhinitis/ conjunctivitis, and for insomnia with obstructive sleep apnea/ failed CPAP and Provent, complicated by history of migraine, hypertension, GERD FOLLOWS FOR: Not sleeping well,sleepy.  12/29/14- 68 yoF never smoker followed here for allergic rhinitis/ conjunctivitis, and for insomnia with obstructive sleep apnea/ failed CPAP and Provent, complicated by history of migraine, hypertension, GERD FOLLOWS FOR: Pt states she is not feeling well; head hurts, chest hurts, ear pain, sore throat, ? fevers.  Has been working hard for [employer] in this election season and has gotten acute infection. Began as an upper respiratory infection with headache, earache, sore throat then chest congestion. Sputum now turning green, low-grade fever, increased cough. Evaluated by Dr. Charolotte Capuchin for acute rectal bleeding attributed to internal hemorrhoids. Dieting weight down for blood sugar. She has not yet tried Belsomra for sleep, waiting to feel  better.  03/03/15- 68 yoF never smoker followed here for allergic rhinitis/ conjunctivitis, and for insomnia with obstructive sleep apnea/ failed CPAP and Provent, complicated by history of migraine, hypertension, GERD FOLLOWS FOR: Pt denies any SOB, wheezing, cough or congestion; continues to have trouble with insomnia-has not given Belsomra a "fair" chance. She is extremely busy now in her job with the [employer]. That stress feeds her insomnia. She hasn't wanted to risk trying a new medication until after the election. No significant nasal congestion or drainage. Occasional antihistamine.  Review of Systems-see HPI Constitutional:   +  weight loss-diet, night sweats, fevers, chills, fatigue, lassitude. + insomnia HEENT:   +headaches, difficulty swallowing, tooth/dental problems, +sore throat,       No- sneezing, itching, no-ears itch,+nasal congestion, post nasal drip,  CV:  No-   chest pain, orthopnea, PND, swelling in lower extremities, anasarca, dizziness, palpitations Resp: No-   shortness of breath with exertion or at rest.           productive cough, non-productive cough,  No-  coughing up of blood.              No-   change in color of mucus.  No- wheezing.   Skin: No-   rash or lesions. GI:  No-   heartburn, indigestion, abdominal pain, nausea, vomiting,  GU: MS:  No-   joint pain or swelling.   Neuro- :  Psych:  No- change in mood or affect. Some- depression or anxiety.  No memory loss.   Physical Exam  General- Alert, Oriented, Affect-appropriate, very pleasant,   Skin- rash-none, lesions- none, excoriation- none Lymphadenopathy- none Head- atraumatic            Eyes- Gross vision intact, PERRLA, conjunctivae clear secretions            Ears- Hearing, canals normal  Nose- clear , No- sores,  no -Septal dev, mucus, polyps, erosion, perforation.             Throat- Mallampati III , mucosa clear-not read , drainage- none, tonsils- atrophic , + hoarse  Neck-  flexible , trachea midline, no stridor , thyroid nl, carotid no bruit Chest - symmetrical excursion , unlabored           Heart/CV- RRR , no murmur , no gallop  , no rub, nl s1 s2                           - JVD- none , edema- none, stasis changes- none, varices- none           Lung- + few rattles, wheeze- none, cough+ light , dullness-none, rub- none           Chest wall-  Abd- Br/ Gen/ Rectal- Not done, not indicated Extrem- cyanosis- none, clubbing, none, atrophy- none, strength- nl Neuro- grossly intact to observation

## 2015-03-03 NOTE — Patient Instructions (Signed)
FLu vax  We will give the Prevnar pneumonia vaccine next visit

## 2015-03-06 NOTE — Assessment & Plan Note (Signed)
Adequate control, especially now at end of ragweed season

## 2015-03-06 NOTE — Assessment & Plan Note (Signed)
Has dieted off about 18 pounds over the last year and a half, reducing tendency to OSA. Never tolerated interventions.

## 2015-03-06 NOTE — Assessment & Plan Note (Signed)
Mostly irregular sleep schedule and stress. She has Belsomra samples to try if she decides to do so. I have offered referral for cognitive behavioral therapy and educated good sleep hygiene.

## 2015-03-26 ENCOUNTER — Telehealth: Payer: Self-pay | Admitting: Internal Medicine

## 2015-03-26 MED ORDER — AZITHROMYCIN 250 MG PO TABS: ORAL_TABLET | ORAL | Status: DC

## 2015-03-26 NOTE — Telephone Encounter (Signed)
Asks Z pak for acute bronchitis- sent to [facility]

## 2015-04-11 ENCOUNTER — Ambulatory Visit (INDEPENDENT_AMBULATORY_CARE_PROVIDER_SITE_OTHER): Payer: Medicare Other | Admitting: Gynecology

## 2015-04-11 ENCOUNTER — Encounter: Payer: Self-pay | Admitting: Gynecology

## 2015-04-11 VITALS — BP 120/70 | Ht 62.5 in | Wt 162.0 lb

## 2015-04-11 DIAGNOSIS — N952 Postmenopausal atrophic vaginitis: Secondary | ICD-10-CM

## 2015-04-11 DIAGNOSIS — E041 Nontoxic single thyroid nodule: Secondary | ICD-10-CM

## 2015-04-11 DIAGNOSIS — Z01419 Encounter for gynecological examination (general) (routine) without abnormal findings: Secondary | ICD-10-CM | POA: Diagnosis not present

## 2015-04-11 NOTE — Progress Notes (Signed)
[employer]ina [birth year] [id number]        68 y.o.  G2P2002  No LMP recorded. Patient has had a hysterectomy. for breast and pelvic exam  Past medical history,surgical history, problem list, medications, allergies, family history and social history were all reviewed and documented as reviewed in the EPIC chart.  ROS:  Performed with pertinent positives and negatives included in the history, assessment and plan.   Additional significant findings :  none   Exam: Programmer, multimedia Vitals:   04/11/15 1039  BP: 120/70  Height: 5' 2.5" (1.588 m)  Weight: 162 lb (73.483 kg)   General appearance:  Normal affect, orientation and appearance. Skin: Grossly normal HEENT: Without gross lesions.  No cervical or supraclavicular adenopathy. Thyroid normal.  Lungs:  Clear without wheezing, rales or rhonchi Cardiac: RR, without RMG Abdominal:  Soft, nontender, without masses, guarding, rebound, organomegaly or hernia Breasts:  Examined lying and sitting without masses, retractions, discharge or axillary adenopathy. Pelvic:  Ext/BUS/vagina with atrophic changes  Adnexa  Without masses or tenderness    Anus and perineum  Normal   Rectovaginal  Normal sphincter tone without palpated masses or tenderness.    Assessment/Plan:  69 y.o. [id number] female for breast and pelvic exam.   1. Postmenopausal/atrophic genital changes. Previous use of HRT but now off doing well without significant hot flushes, night sweats, vaginal dryness. Status post hysterectomy in the past for benign indications. Continue to monitor and report any issues. 2. Pap smear 2015. No Pap smear done today. No history of abnormal Pap smears. Status post hysterectomy for benign indications. Over the age of 34. Options to stop screening reviewed. 3. DEXA 2013 normal. We'll plan repeat at age 57. Increased calcium vitamin D reviewed. 4. Mammography scheduled in December. SBE monthly reviewed. 5. Colonoscopy 2014. Repeat at their  recommended interval. 6. Recent carotid ultrasound found incidental small thyroid solid nodule. Dr. Osborne Casco recommended 6 month follow up. Patient's very uncomfortable with this and had many questions that we reviewed. I recommended she call him and discussed this with him and ask if she could have a repeat ultrasound sooner. I also discussed referral to an endocrinologist. She can follow up with Dr. Osborne Casco in reference to this. 7. Health maintenance. No routine lab work done as she does this at her primary physician's office. Follow up 1 year, sooner as needed.   [name] MD, 11:08 AM 04/11/2015

## 2015-04-11 NOTE — Patient Instructions (Signed)

## 2015-04-15 ENCOUNTER — Telehealth: Payer: Self-pay

## 2015-04-15 NOTE — Telephone Encounter (Signed)
Patient said when she was here last week she told you about thyroid nodule. You told her if PCP would not give her a referral to Dr. Adonis Housekeeper you would do it for her. She said she would really like to have referral if you will do it.  Patient explained that she had not received much cooperation. The nurse had told her it was to soon to do anything as insurance would not cover. She called her ins co and they said that was not true they would cover what she needed.  She asked for referral to Dr. Excell Seltzer and nurse said Dr. Osborne Casco would give her referral but since it had been 30 days since seen she will need to come back in for a visit for referral.  Patient was upset with the way they are treating her.   [place] for referral?  Pt knows TF off today. Will be next week.

## 2015-04-18 ENCOUNTER — Telehealth: Payer: Self-pay | Admitting: *Deleted

## 2015-04-18 DIAGNOSIS — E041 Nontoxic single thyroid nodule: Secondary | ICD-10-CM

## 2015-04-18 NOTE — Telephone Encounter (Signed)
I would recommend an appointment to see an endocrinologist as far as management of the nodule. Dr. Excell Seltzer is a surgeon and this nodule may not need to be taken out but certainly I think an endocrinologist taking a look at it would be okay. I would recommend an appointment with Dr. Cruzita Lederer at [facility]

## 2015-04-18 NOTE — Telephone Encounter (Signed)
Patient advised. I told her Anderson Malta would handle referral. She said to let her know if she needs to go by and get records from Dr. Osborne Casco to take with her to appt.

## 2015-04-18 NOTE — Telephone Encounter (Signed)
Referral placed they will contact pt to schedule. 

## 2015-04-18 NOTE — Telephone Encounter (Signed)
-----   Message from Ramond Craver, [place] sent at 04/18/2015 10:43 AM EST ----- Regarding: referral to endocrinologist See my telephone encounter from 04/15/15 for more details.  Dr. Loetta Rough recommended, "I would recommend an appointment to see an endocrinologist as far as management of the nodule. Dr. Excell Seltzer is a surgeon and this nodule may not need to be taken out but certainly I think an endocrinologist taking a look at it would be okay. I would recommend an appointment with Dr. Cruzita Lederer at [facility]."  Patient said to just let know if she needs to get her records from Dr. Osborne Casco to take with her.

## 2015-04-20 NOTE — Telephone Encounter (Signed)
Appointment on 05/11/15 @ 11:15am with Dr.Gherge

## 2015-04-25 ENCOUNTER — Encounter: Payer: Self-pay | Admitting: Gynecology

## 2015-05-11 ENCOUNTER — Encounter: Payer: Self-pay | Admitting: Internal Medicine

## 2015-05-11 ENCOUNTER — Ambulatory Visit (INDEPENDENT_AMBULATORY_CARE_PROVIDER_SITE_OTHER): Payer: Medicare Other | Admitting: Internal Medicine

## 2015-05-11 VITALS — BP 122/72 | HR 71 | Temp 98.1°F | Wt 163.0 lb

## 2015-05-11 DIAGNOSIS — C73 Malignant neoplasm of thyroid gland: Secondary | ICD-10-CM | POA: Diagnosis not present

## 2015-05-11 DIAGNOSIS — E041 Nontoxic single thyroid nodule: Secondary | ICD-10-CM | POA: Diagnosis not present

## 2015-05-11 NOTE — Progress Notes (Addendum)
Patient ID: [employer], female   DOB: [birth year], 69 y.o.   MRN: [id number]   HPI  Teresa Molina is a 69 y.o.-year-old female, referred by Dr. Phineas Molina, for evaluation for a left thyroid nodule. PCP: Dr. Osborne Molina.  Patient has been found to have an incidental thyroid nodule during a recent carotid ultrasound, on 03/10/2015. The nodule was seen in the left lobe and measured 0.9 x 0.8 x 0.9 centimeters.  She is here for further evaluation.  Pt denies: - feeling nodules in neck - hoarseness - dysphagia - choking - SOB with lying down  I reviewed pt's thyroid tests: 03/07/2015: TSH 3.22, fT4 0.99   Pt denies: - fatigue - heat intolerance/cold intolerance - tremors - palpitations - anxiety/depression - hyperdefecation/constipation - weight loss/weight gain - dry skin - hair loss  No FH of thyroid ds. No FH of thyroid cancer. No h/o radiation tx to head or neck.  No seaweed or kelp. No recent contrast studies. No steroid use. No herbal supplements. No Biotin supplements or Hair, Skin and Nails vitamins.  She recently had bronchitis >> now resolved.    she has a history of HTN, HL, GERD, and sleep apnea.  ROS: Constitutional: no weight gain/loss, no fatigue, no subjective hyperthermia/hypothermia, + poor sleep Eyes: no blurry vision, no xerophthalmia ENT: no sore throat, no nodules palpated in throat, no dysphagia/odynophagia, no hoarseness Cardiovascular: no CP/SOB/palpitations/leg swelling Respiratory: no cough/SOB Gastrointestinal: no N/V/D/C Musculoskeletal: no muscle/joint aches Skin: no rashes Neurological: no tremors/numbness/tingling/dizziness Psychiatric: no depression/anxiety  Past Medical History  Diagnosis Date  . ALLERGIC RHINITIS   . Hypertension   . Insomnia   . OSA on CPAP     NPSG 08-03-09: AHI 17.6; CPAP 12/ AHI 0; PLMA  . Allergic conjunctivitis   . GERD (gastroesophageal reflux disease)   . Esophageal reflux   . Food allergy     angioedema >  strawberries  . Breast cyst     left breast - ultrasound benign 2010   Past Surgical History  Procedure Laterality Date  . Tonsillectomy  1952 - approximate  . Rotator cuff repair  2006  . Total abdominal hysterectomy  1978  . Cholecystectomy  1991  . Breast surgery  2013    Rt breast mass excision   Social History   Social History  . Marital Status: Divorced    Spouse Name: N/A  . Number of Children: 2   Occupational History  . 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 Smoking status: Never Smoker   . Smokeless tobacco: Never Used  . Alcohol Use: 0.0 oz/week    0 Standard drinks or equivalent per week     Comment: RARELY  . Drug Use: No   Current Outpatient Prescriptions on File Prior to Visit  Medication Sig Dispense Refill  . aspirin 81 MG tablet Take by mouth daily.      Marland Kitchen ezetimibe-simvastatin (VYTORIN) 10-20 MG per tablet Take 1 tablet by mouth at bedtime. 30 tablet 0  . labetalol (NORMODYNE) 200 MG tablet Take 1 tablet (200 mg total) by mouth 2 (two) times daily. 60 tablet 0  . RABEprazole (ACIPHEX) 20 MG tablet Take 20 mg by mouth daily.     Marland Kitchen triamterene-hydrochlorothiazide (MAXZIDE-25) 37.5-25 MG per tablet Take 1 tablet by mouth daily. 30 tablet 0  . ibuprofen (ADVIL,MOTRIN) 200 MG tablet Take 200 mg  by mouth as needed. Reported on 05/11/2015     No current facility-administered medications on file prior to visit.   Allergies  Allergen Reactions  . Amlodipine Swelling  . Diovan [Valsartan] Swelling  . Lipitor [Atorvastatin Calcium] Other (See Comments)    Caused muscle paralysis in legs  . Codeine   . Compazine [Prochlorperazine Edisylate]   . Metoclopramide Hcl   . Reglan [Metoclopramide]   . Benadryl [Diphenhydramine Hcl] Palpitations  . Penicillins Other (See Comments)    Yeast infections  . Prochlorperazine Edisylate Other (See Comments)    Facial spasms     Family History  Problem Relation Age of Onset  . Other Father     brain tumor  . COPD Mother    PE: BP 122/72 mmHg  Pulse 71  Temp(Src) 98.1 F (36.7 C)  Wt 163 lb (73.936 kg) Body mass index is 29.32 kg/(m^2). Wt Readings from Last 3 Encounters:  05/11/15 163 lb (73.936 kg)  04/11/15 162 lb (73.483 kg)  03/03/15 160 lb 3.2 oz (72.666 kg)   Constitutional: overweight, in NAD Eyes: PERRLA, EOMI, no exophthalmos ENT: moist mucous membranes, no thyromegaly,  No thyroid nodule palpable, no cervical lymphadenopathy;  Mallampati 3 Cardiovascular: RRR, No MRG Respiratory: CTA B Gastrointestinal: abdomen soft, NT, ND, BS+ Musculoskeletal: no deformities, strength intact in all 4;  Skin: moist, warm, no rashes Neurological: no tremor with outstretched hands, DTR normal in all 4  ASSESSMENT: 1.  Left thyroid  Nodule -  Incidentally found on carotid ultrasound in 02/2059  PLAN: 1.  Left thyroid  nodule - I reviewed the report of her carotid ultrasound along with the patient. I pointed out that the nodule appears small, but that the carotid U/S is not optimal to evaluate thyroid nodules and we would need a dedicated thyroid U/S for better characterization of the nodule. Although the nodule is small, the new ATA guidelines recommend Bx if they nodule displays worrisome features even if <1 cm. We did discuss which features are worrisome:   internal microcalcifications   internal blood flow    not well delimited from the surrounding thyroid tissue    tall more than wide - If the nodule present some of the above features, we may need a biopsy. Patient agrees with this if needed. We discussed about the biopsy technique what she should expect. She was also provided with written instructions about thyroid nodules in general and thyroid biopsy in particular. - Pt does not have a thyroid cancer family history or a personal history of RxTx to head/neck. All these would favor benignity.  - I  explained that this is not cancer, we can continue to follow her on a yearly basis, and check another ultrasound in another year or 2. - she should let me know if she develops neck compression symptoms - We discussed about thyroid cancer in general, the fact that it is usually a benign, indolent cancer, which most likely will not affect her life expectancy. However,  It requires thyroidectomy and careful care afterwards. - I'll see her back in a year, unless the thyroid ultrasound and possible FNA are abnormal  CLINICAL DATA: Thyroid nodule by outside carotid ultrasound.  EXAM: THYROID ULTRASOUND  TECHNIQUE: Ultrasound examination of the thyroid gland and adjacent soft tissues was performed.  COMPARISON: None available  FINDINGS: Right thyroid lobe  Measurements: 4.1 x 1.5 x 1.3 cm. Numerous scattered small hypoechoic cystic nodules throughout the right lobe all measuring 6 mm or less in size.  No significant right thyroid abnormality to warrant biopsy.  Left thyroid lobe  Measurements: 4.0 x 1.0 x 1.3 cm. Scattered tiny small hypoechoic cystic nodules all measuring 3 mm or less in size. Left lower pole solid hypoechoic nodule measures 10 x 8 x 8 mm, nonspecific. There appears to be minor associated microcalcification.  Isthmus  Thickness: 2 mm. No nodules visualized.  Lymphadenopathy  Mildly prominent cervical lymph nodes bilaterally, short axis diameter 6 mm or less.  IMPRESSION: Left lower pole hypoechoic solid 10 mm nodule with the suggestion of tiny microcalcifications. Mildly prominent cervical lymph nodes bilaterally.  Findings meet consensus criteria for biopsy. Ultrasound-guided fine needle aspiration should be considered, as per the consensus statement: Management of Thyroid Nodules Detected at [place]: Society of Radiologists in [place]. Radiology 2005; [id number].   Electronically Signed By: Jerilynn Mages. Shick M.D. On:  05/23/2015 15:20             L nodule is small, but hypoechoic and with possible small calcifications >> this meets criteria for Bx >> will suggest FNA.  Pt agrees with FNA >> ordered. I will addend the results when they become available.   Adequacy Reason Satisfactory For Evaluation. Diagnosis THYROID, LEFT LOBE INFERIOR, FINE NEEDLE ASPIRATION (SPECIMEN 1 OF 1, COLLECTED ON 06/07/2015): POSITIVE FOR PAPILLARY THYROID CARCINOMA ([place] CATEGORY VI). Teresa Niece MD Pathologist, Electronic Signature (Case signed 06/08/2015) Specimen Clinical Information Left lower pole solid hypoechoic nodule measures 10 x 8 x 8 mm, nonspecific, There appears to be minor associated microcalcification Source Thyroid, Fine Needle Aspiration, Left Lobe Inferior, (Specimen 1 of 1, collected on 06/07/15 )  I called and d/w pt >> she agrees with referral to Sx >> will see her back 1-1.5 mo after the thyroidectomy.

## 2015-05-11 NOTE — Patient Instructions (Addendum)
We will call you with the schedule for the thyroid ultrasound.  After I review the images, I will let you know whether we need to biopsy this nodule.   If there is no need for biopsy, I would like to see  you back in 1 year for reevaluation.  Thyroid Nodule A thyroid nodule is an isolatedgrowth of thyroid cells that forms a lump in your thyroid gland. The thyroid gland is a butterfly-shaped gland. It is found in the lower front of your neck. This gland sends chemical messengers (hormones) through your blood to all parts of your body. These hormones are important in regulating your body temperature and helping your body to use energy. Thyroid nodules are common. Most are not cancerous (are benign). You may have one nodule or several nodules.  Different types of thyroid nodules include:  Nodules that grow and fill with fluid (thyroid cysts).  Nodules that produce too much thyroid hormone (hot nodules or hyperthyroid).  Nodules that produce no thyroid hormone (cold nodules or hypothyroid).  Nodules that form from cancer cells (thyroid cancers). CAUSES Usually, the cause of this condition is not known. RISK FACTORS Factors that make this condition more likely to develop include:  Increasing age. Thyroid nodules become more common in people who are older than 69 years of age.  Gender.  Benign thyroid nodules are more common in women.  Cancerous (malignant) thyroid nodules are more common in men.  A family history that includes:  Thyroid nodules.  Pheochromocytoma.  Thyroid carcinoma.  Hyperparathyroidism.  Certain kinds of thyroid diseases, such as Hashimoto thyroiditis.  Lack of iodine.  A history of head and neck radiation, such as from X-rays. SYMPTOMS It is common for this condition to cause no symptoms. If you have symptoms, they may include:  A lump in your lower neck.  Feeling a lump or tickle in your throat.  Pain in your neck, jaw, or ear.  Having trouble  swallowing. Hot nodules may cause symptoms that include:  Weight loss.  Warm, flushed skin.  Feeling hot.  Feeling nervous.  A racing heartbeat. Cold nodules may cause symptoms that include:  Weight gain.  Dry skin.  Brittle hair. This may also occur with hair loss.  Feeling cold.  Fatigue. Thyroid cancer nodules may cause symptoms that include:  Hard nodules that feel stuck to the thyroid gland.  Hoarseness.  Lumps in the glands near your thyroid (lymph nodes). DIAGNOSIS A thyroid nodule may be felt by your health care provider during a physical exam. This condition may also be diagnosed based on your symptoms. You may also have tests, including:  An ultrasound. This may be done to confirm the diagnosis.  A biopsy. This involves taking a sample from the nodule and looking at it under a microscope to see if the nodule is benign.  Blood tests to make sure that your thyroid is working properly.  Imaging tests such as MRI or CT scan may be done if:  Your nodule is large.  Your nodule is blocking your airway.  Cancer is suspected. TREATMENT Treatment depends on the cause and size of your nodule or nodules. If the nodule is benign, treatment may not be necessary. Your health care provider may monitor the nodule to see if it goes away without treatment. If the nodule continues to grow, is cancerous, or does not go away:  It may need to be drained with a needle.  It may need to be removed with surgery. If you  have surgery, part or all of your thyroid gland may need to be removed as well. HOME CARE INSTRUCTIONS  Pay attention to any changes in your nodule.  Take over-the-counter and prescription medicines only as told by your health care provider.  Keep all follow-up visits as told by your health care provider. This is important. SEEK MEDICAL CARE IF:  Your voice changes.  You have trouble swallowing.  You have pain in your neck, ear, or jaw that is getting  worse.  Your nodule gets bigger.  Your nodule starts to make it harder for you to breathe. SEEK IMMEDIATE MEDICAL CARE IF:  You have a sudden fever.  You feel very weak.  Your muscles look like they are shrinking (muscle wasting).  You have mood swings.  You feel very restless.  You feel confused.  You are seeing or hearing things that other people do not see or hear (having hallucinations).  You feel suddenly nauseous or throw up.  You suddenly have diarrhea.  You have chest pain.  There is a loss of consciousness.   This information is not intended to replace advice given to you by your health care provider. Make sure you discuss any questions you have with your health care provider.   Document Released: 03/23/2004 Document Revised: 01/19/2015 Document Reviewed: 08/11/2014 Elsevier Interactive Patient Education 2016 Elsevier Inc[employer]  Thyroid Biopsy The thyroid gland is a butterfly-shaped gland located in the front of the neck. It produces hormones that affect metabolism, growth and development, and body temperature. Thyroid biopsy is a procedure in which small samples of tissue or fluid are removed from the thyroid gland. The samples are then looked at under a microscope to check for abnormalities. This procedure is done to determine the cause of thyroid problems. It may be done to check for infection, cancer, or other thyroid problems. Two methods may be used for a thyroid biopsy. In one method, a thin needle is inserted through the skin and into the thyroid gland. In the other method, an open incision is made through the skin. LET [facility] CARE PROVIDER KNOW ABOUT:   Any allergies you have.  All medicines you are taking, including vitamins, herbs, eye drops, creams, and over-the-counter medicines.  Previous problems you or members of your family have had with the use of anesthetics.  Any blood disorders you have.  Previous surgeries you have had.  Medical  conditions you have. RISKS AND COMPLICATIONS Generally, this is a safe procedure. However, problems can occur and include:  Bleeding from the procedure site.  Infection.  Injury to structures near the thyroid gland. BEFORE THE PROCEDURE   Ask your health care provider about:  Changing or stopping your regular medicines. This is especially important if you are taking diabetes medicines or blood thinners.  Taking medicines such as aspirin and ibuprofen. These medicines can thin your blood. Do not take these medicines before your procedure if your health care provider asks you not to.  Do not eat or drink anything after midnight on the night before the procedure or as directed by your health care provider.  You may have a blood sample taken. PROCEDURE Either of these methods may be used to perform a thyroid biopsy:  Fine needle biopsy. You may be given medicine to help you relax (sedative). You will be asked to lie on your back with your head tipped backward to extend your neck. An area on your neck will be cleaned. A needle will then be inserted  through the skin of your neck. You may be asked to avoid coughing, talking, swallowing, or making sounds during some portions of the procedure. The needle will be withdrawn once the tissue or fluid samples have been removed. Pressure may be applied to your neck to reduce swelling and ensure that bleeding has stopped. The samples will be sent to a lab for examination.  Open biopsy. You will be given medicine to make you sleep (general anesthetic). An incision will be made in your neck. A sample of thyroid tissue will be removed using surgical tools. The tissue sample will be sent for examination. In some cases, the sample may be examined during the biopsy. If that is done and cancer cells are found, some or all of the thyroid gland may be removed. The incision will be closed with stitches. AFTER THE PROCEDURE   Your recovery will be assessed and  monitored.  You may have soreness and tenderness at the site of the biopsy. This should go away after a few days.  If you had an open biopsy, you may have a hoarse voice or sore throat for a couple days.  It is your responsibility to get your test results.   This information is not intended to replace advice given to you by your health care provider. Make sure you discuss any questions you have with your health care provider.   Document Released: 02/25/2007 Document Revised: 05/21/2014 Document Reviewed: 07/23/2013 Elsevier Interactive Patient Education 2016 Elsevier Inc.  Thyroid Biopsy, Care After Refer to this sheet in the next few weeks. These instructions provide you with information on caring for yourself after your procedure. Your health care provider may also give you more specific instructions. Your treatment has been planned according to current medical practices, but problems sometimes occur. Call your health care provider if you have any problems or questions after your procedure.  WHAT TO EXPECT AFTER THE PROCEDURE After your procedure, it is typical to have the following:  You may have soreness and tenderness at the biopsy site for a few days.  You may have a sore throat or a hoarse voice if you had an open biopsy. This should go away after a couple days. HOME CARE INSTRUCTIONS  Take medicines only as directed by your health care provider.  To ease discomfort at the biopsy site:  Keep your head raised on a pillow when you are lying down.  Support the back of your head and neck with both hands as you sit up from a lying position.   If you have a sore throat, try using throat lozenges or gargling with warm salt water.   Keep all follow-up visits as directed by your health care provider. This is important. SEEK MEDICAL CARE IF:  You have a fever. SEEK IMMEDIATE MEDICAL CARE IF:  You have severe bleeding from the biopsy site.   You have difficulty swallowing.    You have drainage, redness, swelling, or pain at the biopsy site.   You have swollen glands (lymph nodes) in your neck.    This information is not intended to replace advice given to you by your health care provider. Make sure you discuss any questions you have with your health care provider.   Document Released: 11/25/2013 Document Reviewed: 11/25/2013 Elsevier Interactive Patient Education [employer].

## 2015-05-23 ENCOUNTER — Ambulatory Visit
Admission: RE | Admit: 2015-05-23 | Discharge: 2015-05-23 | Disposition: A | Discharge status: Home / Self Care | Payer: Medicare HMO | Source: Ambulatory Visit | Attending: Internal Medicine | Admitting: Internal Medicine

## 2015-05-23 DIAGNOSIS — E041 Nontoxic single thyroid nodule: Secondary | ICD-10-CM | POA: Diagnosis not present

## 2015-05-24 ENCOUNTER — Telehealth: Payer: Self-pay | Admitting: Internal Medicine

## 2015-05-24 NOTE — Telephone Encounter (Signed)
Done

## 2015-05-24 NOTE — Telephone Encounter (Signed)
Patient Name: Teresa Molina Gender: Female DOB: October 18, 1945 Age: 69 Y 20 D Return Phone Number: [phone] (Primary) Address: City/State/Zip: [place] Client [facility] Night - Client Client Site [facility] Physician Datto, Lindsay Type Call [place] Name same [place] Phone Number same Relationship To Patient Self Is this call to report lab results? No Call Type General Information Initial Comment Caller States just got email on scheduling a biopsy please go ahead and schedule it General Information Type Message Only Nurse Assessment Guidelines Guideline Title Affirmed Question Affirmed Notes Nurse Date

## 2015-05-24 NOTE — Telephone Encounter (Signed)
Read message below. Pt is ok for [place] to schedule biopsy.

## 2015-06-07 ENCOUNTER — Other Ambulatory Visit (HOSPITAL_COMMUNITY)
Admission: RE | Admit: 2015-06-07 | Discharge: 2015-06-07 | Disposition: A | Discharge status: Home / Self Care | Payer: Medicare HMO | Source: Ambulatory Visit | Attending: Radiology | Admitting: Radiology

## 2015-06-07 ENCOUNTER — Ambulatory Visit
Admission: RE | Admit: 2015-06-07 | Discharge: 2015-06-07 | Disposition: A | Discharge status: Home / Self Care | Payer: Medicare HMO | Source: Ambulatory Visit | Attending: Internal Medicine | Admitting: Internal Medicine

## 2015-06-07 DIAGNOSIS — E041 Nontoxic single thyroid nodule: Secondary | ICD-10-CM | POA: Insufficient documentation

## 2015-06-07 DIAGNOSIS — C73 Malignant neoplasm of thyroid gland: Secondary | ICD-10-CM | POA: Diagnosis not present

## 2015-06-08 ENCOUNTER — Telehealth: Payer: Self-pay | Admitting: Internal Medicine

## 2015-06-08 NOTE — Addendum Note (Signed)
Addended by: Philemon Kingdom on: 06/08/2015 01:27 PM   Modules accepted: Orders

## 2015-06-09 NOTE — Telephone Encounter (Signed)
FYI for [place]- dx'd Thyroid cancer by needle bx. Pending Surgical consult

## 2015-06-10 ENCOUNTER — Encounter: Payer: Self-pay | Admitting: Internal Medicine

## 2015-06-21 ENCOUNTER — Ambulatory Visit: Payer: Self-pay | Admitting: Surgery

## 2015-06-21 DIAGNOSIS — C73 Malignant neoplasm of thyroid gland: Secondary | ICD-10-CM | POA: Diagnosis not present

## 2015-07-05 ENCOUNTER — Encounter: Payer: Self-pay | Admitting: Internal Medicine

## 2015-07-05 ENCOUNTER — Ambulatory Visit (INDEPENDENT_AMBULATORY_CARE_PROVIDER_SITE_OTHER): Payer: Medicare HMO | Admitting: Internal Medicine

## 2015-07-05 VITALS — BP 116/72 | HR 69 | Ht 62.0 in | Wt 164.2 lb

## 2015-07-05 DIAGNOSIS — G4733 Obstructive sleep apnea (adult) (pediatric): Secondary | ICD-10-CM

## 2015-07-05 DIAGNOSIS — G4700 Insomnia, unspecified: Secondary | ICD-10-CM | POA: Diagnosis not present

## 2015-07-05 MED ORDER — CLONAZEPAM 1 MG PO TABS: ORAL_TABLET | ORAL | Status: DC

## 2015-07-05 NOTE — Patient Instructions (Signed)
Script for clonazepam 1/2 or 1 at bedtime for sleep as needed

## 2015-07-05 NOTE — Progress Notes (Signed)
09/25/12-66 yoF never smoker followed here for allergic rhinitis/ conjunctivitis, and for insomnia with obstructive sleep apnea/ failed CPAP and Provent, complicated by history of migraine, hypertension, GERD. FOLLOWS FOR: watery eyes, slight cough, feels stuffy from pollen Chronic insomnia pattern is not really changed. She has been under some stress recently related to her job for the [employer]  12/03/12- 66 yoF never smoker followed here for allergic rhinitis/ conjunctivitis, and for insomnia with obstructive sleep apnea/ failed CPAP and Provent, complicated by history of migraine, hypertension, GERD. FOLLOWS FOR: having stuffiness at times; other wise no cough, SOB, wheezing, or congestion. Chronic insomnia pattern is up and down but persistent. Clonazepam 0.5 mg has help when used occasionally. Sleep apnea status is uncertain but not obtrusive. She lives alone. Treatments all interfered with insomnia more than they helped. Occasional rhinitis, mostly treated OTC  02/06/13- 66 yoF never smoker followed here for allergic rhinitis/ conjunctivitis, and for insomnia with obstructive sleep apnea/ failed CPAP and Provent, complicated by history of migraine, hypertension, GERD. FOLLOWS FOR: denies any wheezing, SOB, cough, or congestion. Still having leg jerks and just not sleeping She lives alone and admits she"doesn't like night time and doesn't want to go to sleep". We discussed her experience with meds and she is willing to try Xanax plus Amitriptyline at bedtime.  04/10/13- 66 yoF never smoker followed here for allergic rhinitis/ conjunctivitis, and for insomnia with obstructive sleep apnea/ failed CPAP and Provent, complicated by history of migraine, hypertension, GERD FOLLOWS FOR:  Breathing doing well.  Still having trouble sleeping ,4 hours per night.  Not using CPAP. She did not follow through with the cognitive behavioral therapy or oral appliance suggestions. We discussed  medications and her lifestyle. She lives alone.  06/10/13- 66 yoF never smoker followed here for allergic rhinitis/ conjunctivitis, and for insomnia with obstructive sleep apnea/ failed CPAP and Provent, complicated by history of migraine, hypertension, GERD FOLLOWS FOR: sleeps for about 4.5 hours each night; still not using CPAP -can not stand it. She has tried CPAP and BiPAP, been educated on oral appliances and the importance of maintaining normal body weight. Her primary sleep complaint is insomnia and sleep apnea interventions have made that worse. She feels her sleep status is stable and she lives with it. Educated again on sleep hygiene. Bothered recently by watery rhinorrhea. Discussed antihistamines. Occasional soreness inside nostrils from wiping her nose, using Neosporin.  09/10/13- 67 yoF never smoker followed here for allergic rhinitis/ conjunctivitis, and for insomnia with obstructive sleep apnea/ failed CPAP and Provent, complicated by history of migraine, hypertension, GERD Minor seasonal stuffiness, no infection or chest discomfort. Antihistamines if needed. Sleep- bedtime 1:30-6:30 or 7:00AM. Admits tired. Discussed sleep hygiene.   12/31/13- 67 yoF never smoker followed here for allergic rhinitis/ conjunctivitis, and for insomnia with obstructive sleep apnea/ failed CPAP and Provent, complicated by history of migraine, hypertension, GERD  03/03/14-  67 yoF never smoker followed here for allergic rhinitis/ conjunctivitis, and for insomnia with obstructive sleep apnea/ failed CPAP and Provent, complicated by history of migraine, hypertension, GERD FOLLOWS FOR: Patient still not wearing CPAP. She has cough for past 2 weeks. She is having greenish phlegm production sometime.  05/06/14- 67 yoF never smoker followed here for allergic rhinitis/ conjunctivitis, and for insomnia with obstructive sleep apnea/ failed CPAP and Provent, complicated by history of migraine, hypertension,  GERD Intervals  with good sleep are still infrequent. Overall pattern has not changed No recent cough or wheeze  2/10//16-  4 yoF never smoker followed here for allergic rhinitis/ conjunctivitis, and for insomnia with obstructive sleep apnea/ failed CPAP and Provent, complicated by history of migraine, hypertension, GERD, rectal bleeding Acute visit-sick for 5 days with sore throat, malaise, painful dry cough, scant green sputum, hoarseness. Had flu shot. Myalgias. No fever or blood and no swollen glands.  07/09/14- 68 yoF never smoker followed here for allergic rhinitis/ conjunctivitis, and for insomnia with obstructive sleep apnea/ failed CPAP and Provent, complicated by history of migraine, hypertension, GERD FOLLOWS FOR: Continues to have cough and raspy voice but feels much better. Slowly resolving bronchitis. Z-Pak helped some but she asks about a repeat. Sputum is clear or, no fever.  09/09/14- 68 yoF never smoker followed here for allergic rhinitis/ conjunctivitis, and for insomnia with obstructive sleep apnea/ failed CPAP and Provent, complicated by history of migraine, hypertension, GERD FOLLOWS FOR: Not sleeping well,sleepy.  12/29/14- 68 yoF never smoker followed here for allergic rhinitis/ conjunctivitis, and for insomnia with obstructive sleep apnea/ failed CPAP and Provent, complicated by history of migraine, hypertension, GERD FOLLOWS FOR: Pt states she is not feeling well; head hurts, chest hurts, ear pain, sore throat, ? fevers.  Has been working hard for [employer] in this election season and has gotten acute infection. Began as an upper respiratory infection with headache, earache, sore throat then chest congestion. Sputum now turning green, low-grade fever, increased cough. Evaluated by Dr. Charolotte Capuchin for acute rectal bleeding attributed to internal hemorrhoids. Dieting weight down for blood sugar. She has not yet tried Belsomra for sleep, waiting to feel  better.  03/03/15- 68 yoF never smoker followed here for allergic rhinitis/ conjunctivitis, and for insomnia with obstructive sleep apnea/ failed CPAP and Provent, complicated by history of migraine, hypertension, GERD FOLLOWS FOR: Pt denies any SOB, wheezing, cough or congestion; continues to have trouble with insomnia-has not given Belsomra a "fair" chance. She is extremely busy now in her job with the [employer]. That stress feeds her insomnia. She hasn't wanted to risk trying a new medication until after the election. No significant nasal congestion or drainage. Occasional antihistamine.  07/05/2015-70 year old female never smoker followed here for allergic rhinitis/conjunctivitis, insomnia, OSA/failed CPAP and Provent nasal valves, complicated by history of migraine, hypertension, GERD, thyroid cancer FOLLOWS FOR: Pt states she is having hard sleeping at night; had alot of other issues going on. Now pending thyroidectomy after recently diagnosed thyroid cancer. Her chronic insomnia problems are consequently worse. We reviewed her previous medication experience and options. She feels she did best in the past with clonazepam.  Review of Systems-see HPI Constitutional:   +  weight loss-diet, night sweats, fevers, chills, fatigue, lassitude. + insomnia HEENT:   +headaches, difficulty swallowing, tooth/dental problems, +sore throat,       No- sneezing, itching, no-ears itch,+nasal congestion, post nasal drip,  CV:  No-   chest pain, orthopnea, PND, swelling in lower extremities, anasarca, dizziness, palpitations Resp: No-   shortness of breath with exertion or at rest.           productive cough, non-productive cough,  No-  coughing up of blood.              No-   change in color of mucus.  No- wheezing.   Skin: No-   rash or lesions. GI:  No-   heartburn, indigestion, abdominal pain, nausea, vomiting,  GU: MS:  No-   joint pain or swelling.   Neuro- :  Psych:  No-  change in mood or  affect. Some- depression or anxiety.  No memory loss.   Physical Exam  General- Alert, Oriented, Affect-appropriate, very pleasant,   Skin- rash-none, lesions- none, excoriation- none Lymphadenopathy- none Head- atraumatic            Eyes- Gross vision intact, PERRLA, conjunctivae clear secretions            Ears- Hearing, canals normal            Nose- clear , No- sores,  no -Septal dev, mucus, polyps, erosion, perforation.             Throat- Mallampati III , mucosa clear-not read , drainage- none, tonsils- atrophic , + hoarse  Neck- flexible , trachea midline, no stridor , thyroid nl, carotid no bruit Chest - symmetrical excursion , unlabored           Heart/CV- RRR , no murmur , no gallop  , no rub, nl s1 s2                           - JVD- none , edema- none, stasis changes- none, varices- none           Lung- + few rattles, wheeze- none, cough+ light , dullness-none, rub- none           Chest wall-  Abd- Br/ Gen/ Rectal- Not done, not indicated Extrem- cyanosis- none, clubbing, none, atrophy- none, strength- nl Neuro- grossly intact to observation

## 2015-07-05 NOTE — Assessment & Plan Note (Signed)
History of moderate obstructive sleep apnea. She did not tolerate CPAP or nasal valves and did not choose an oral appliance. She has lost some weight. Plan-would recommend close monitoring after general anesthesia/surgery with option to place CPAP during sleep at the hospital, set on AutoPap per respiratory therapy if necessary.

## 2015-07-05 NOTE — Assessment & Plan Note (Signed)
High stress time for her magnifying her chronic insomnia problems. Discussed sleep hygiene. Cognitive behavioral therapy has been previously offered. Plan-clonazepam with appropriate discussion

## 2015-07-12 NOTE — Patient Instructions (Signed)
Teresa Molina  07/12/2015   Your procedure is scheduled on: 07/21/2015    Report to [facility] Main  Entrance take [place]  elevators to 3rd floor to  [place] at    [place] AM.  Call this number if you have problems the morning of surgery [phone]   Remember: ONLY 1 PERSON MAY GO WITH YOU TO SHORT STAY TO GET  READY MORNING OF YOUR SURGERY.  Do not eat food or drink liquids :After Midnight.     Take these medicines the morning of surgery with A SIP OF WATER:  Labetalol ( Normodyne), Aciphex                                 You may not have any metal on your body including hair pins and              piercings  Do not wear jewelry, make-up, lotions, powders or perfumes, deodorant             Do not wear nail polish.  Do not shave  48 hours prior to surgery.             Do not bring valuables to the hospital. [place].  Contacts, dentures or bridgework may not be worn into surgery.  Leave suitcase in the car. After surgery it may be brought to your room.       Special Instructions: coughing and deep breathing exercises, leg exercises               Please read over the following fact sheets you were given: _____________________________________________________________________             [facility] - Preparing for Surgery Before surgery, you can play an important role.  Because skin is not sterile, your skin needs to be as free of germs as possible.  You can reduce the number of germs on your skin by washing with CHG (chlorahexidine gluconate) soap before surgery.  CHG is an antiseptic cleaner which kills germs and bonds with the skin to continue killing germs even after washing. Please DO NOT use if you have an allergy to CHG or antibacterial soaps.  If your skin becomes reddened/irritated stop using the CHG and inform your nurse when you arrive at Short Stay. Do not shave (including legs and underarms)  for at least 48 hours prior to the first CHG shower.  You may shave your face/neck. Please follow these instructions carefully:  1.  Shower with CHG Soap the night before surgery and the  morning of Surgery.  2.  If you choose to wash your hair, wash your hair first as usual with your  normal  shampoo.  3.  After you shampoo, rinse your hair and body thoroughly to remove the  shampoo.                           4.  Use CHG as you would any other liquid soap.  You can apply chg directly  to the skin and wash                       Gently  with a scrungie or clean washcloth.  5.  Apply the CHG Soap to your body ONLY FROM THE NECK DOWN.   Do not use on face/ open                           Wound or open sores. Avoid contact with eyes, ears mouth and genitals (private parts).                       Wash face,  Genitals (private parts) with your normal soap.             6.  Wash thoroughly, paying special attention to the area where your surgery  will be performed.  7.  Thoroughly rinse your body with warm water from the neck down.  8.  DO NOT shower/wash with your normal soap after using and rinsing off  the CHG Soap.                9.  Pat yourself dry with a clean towel.            10.  Wear clean pajamas.            11.  Place clean sheets on your bed the night of your first shower and do not  sleep with pets. Day of Surgery : Do not apply any lotions/deodorants the morning of surgery.  Please wear clean clothes to the hospital/surgery center.  FAILURE TO FOLLOW THESE INSTRUCTIONS MAY RESULT IN THE CANCELLATION OF YOUR SURGERY PATIENT SIGNATURE_________________________________  NURSE SIGNATURE__________________________________  ________________________________________________________________________

## 2015-07-13 ENCOUNTER — Encounter (HOSPITAL_COMMUNITY): Payer: Self-pay

## 2015-07-13 ENCOUNTER — Ambulatory Visit (HOSPITAL_COMMUNITY)
Admission: RE | Admit: 2015-07-13 | Discharge: 2015-07-13 | Disposition: A | Discharge status: Home / Self Care | Payer: Medicare HMO | Source: Ambulatory Visit | Attending: Anesthesiology | Admitting: Anesthesiology

## 2015-07-13 ENCOUNTER — Encounter (HOSPITAL_COMMUNITY)
Admission: RE | Admit: 2015-07-13 | Discharge: 2015-07-13 | Disposition: A | Discharge status: Home / Self Care | Payer: Medicare HMO | Source: Ambulatory Visit | Attending: Surgery | Admitting: Surgery

## 2015-07-13 DIAGNOSIS — Z01812 Encounter for preprocedural laboratory examination: Secondary | ICD-10-CM | POA: Insufficient documentation

## 2015-07-13 DIAGNOSIS — Z01818 Encounter for other preprocedural examination: Secondary | ICD-10-CM | POA: Diagnosis not present

## 2015-07-13 HISTORY — DX: Other specified postprocedural states: Z98.890

## 2015-07-13 HISTORY — DX: Nausea with vomiting, unspecified: R11.2

## 2015-07-13 LAB — BASIC METABOLIC PANEL
ANION GAP: 7 (ref 5–15)
BUN: 18 mg/dL (ref 6–20)
CO2: 29 mmol/L (ref 22–32)
Calcium: 9.4 mg/dL (ref 8.9–10.3)
Chloride: 105 mmol/L (ref 101–111)
Creatinine, Ser: 0.88 mg/dL (ref 0.44–1.00)
GFR calc Af Amer: >60 mL/min (ref >60)
GFR calc non Af Amer: >60 mL/min (ref >60)
GLUCOSE: 130 mg/dL — AB (ref 65–99)
POTASSIUM: 4.3 mmol/L (ref 3.5–5.1)
Sodium: 141 mmol/L (ref 135–145)

## 2015-07-13 LAB — CBC
HEMATOCRIT: 43.7 % (ref 36.0–46.0)
HEMOGLOBIN: 14.5 g/dL (ref 12.0–15.0)
MCH: 30.3 pg (ref 26.0–34.0)
MCHC: 33.2 g/dL (ref 30.0–36.0)
MCV: 91.2 fL (ref 78.0–100.0)
Platelets: 130 10*3/uL — ABNORMAL LOW (ref 150–400)
RBC: 4.79 MIL/uL (ref 3.87–5.11)
RDW: 13.5 % (ref 11.5–15.5)
WBC: 5.5 10*3/uL (ref 4.0–10.5)

## 2015-07-13 NOTE — Progress Notes (Signed)
CBC done 07/13/15 faxed via EPIC to Dr Harlow Asa.

## 2015-07-13 NOTE — Progress Notes (Signed)
LOV- 07/05/15-pulm-EPIC

## 2015-07-17 ENCOUNTER — Encounter (HOSPITAL_COMMUNITY): Payer: Self-pay | Admitting: Surgery

## 2015-07-17 DIAGNOSIS — C73 Malignant neoplasm of thyroid gland: Secondary | ICD-10-CM | POA: Diagnosis present

## 2015-07-17 NOTE — H&P (Signed)
General Surgery [facility]  Teresa Molina DOB: [birth year] Divorced / Language: Cleophus Molt / Race: White Female  History of Present Illness Patient words: thyroid cancer.  The patient is a 70 year old female who presents with thyroid cancer.  Patient referred by Dr. Philemon Kingdom with newly diagnosed papillary thyroid carcinoma. Patient had been noted on routine carotid duplex to have thyroid nodules. She subsequently underwent thyroid ultrasound on May 23, 2015 showing a normal-sized thyroid gland containing small nodules bilaterally. The largest nodule was in the mid left lobe measuring 10 mm in greatest dimension with possible microcalcifications. Biopsy was recommended. Fine-needle aspiration biopsy was performed on June 07, 2015. Cytopathology was positive for papillary thyroid carcinoma. Patient is now referred for consideration for thyroidectomy. Patient has had no prior head or neck surgery. She has never been on thyroid medication. There is no family history of thyroid disease and no family history of endocrine neoplasms. Patient denies tremors or palpitations. She has had no history of compressive symptoms.   Other Problems Gastroesophageal Reflux Disease High blood pressure Sleep Apnea Thyroid Cancer  Past Surgical History Appendectomy Breast Biopsy Right. Colon Polyp Removal - Colonoscopy Gallbladder Surgery - Laparoscopic Hysterectomy (not due to cancer) - Partial Shoulder Surgery Right. Tonsillectomy  Diagnostic Studies History Colonoscopy 1-5 years ago Mammogram within last year Pap Smear 1-5 years ago  Allergies Norvasc *CALCIUM CHANNEL BLOCKERS* Swelling. Diovan *ANTIHYPERTENSIVES* Swelling. Lipitor *ANTIHYPERLIPIDEMICS* Codeine Phosphate *ANALGESICS - OPIOID* Compazine *ANTIPSYCHOTICS/ANTIMANIC AGENTS* Reglan *GASTROINTESTINAL AGENTS - MISC.* Benadryl *ANTIHISTAMINES* Penicillins  Medication  History Labetalol HCl (200MG  Tablet, Oral) Active. RABEprazole Sodium (20MG  Tablet DR, Oral) Active. Vytorin (10-20MG  Tablet, Oral) Active. Triamterene-HCTZ (37.5-25MG  Tablet, Oral) Active. Aspirin (81MG  Tablet DR, Oral) Active. Ibuprofen (200MG  Capsule, Oral) Active. Medications Reconciled  Social History Alcohol use Occasional alcohol use. Caffeine use Coffee, Tea. No drug use Tobacco use Never smoker.  Family History Heart disease in female family member before age 79 Respiratory Condition Mother. Seizure disorder Father.  Pregnancy / Birth History Age at menarche 54 years. Gravida 2 Maternal age 48-20 Para 2  Review of Systems  General Not Present- Appetite Loss, Chills, Fatigue, Fever, Night Sweats, Weight Gain and Weight Loss. Skin Not Present- Change in Wart/Mole, Dryness, Hives, Jaundice, New Lesions, Non-Healing Wounds, Rash and Ulcer. HEENT Not Present- Earache, Hearing Loss, Hoarseness, Nose Bleed, Oral Ulcers, Ringing in the Ears, Seasonal Allergies, Sinus Pain, Sore Throat, Visual Disturbances, Wears glasses/contact lenses and Yellow Eyes. Respiratory Not Present- Bloody sputum, Chronic Cough, Difficulty Breathing, Snoring and Wheezing. Breast Not Present- Breast Mass, Breast Pain, Nipple Discharge and Skin Changes. Cardiovascular Not Present- Chest Pain, Difficulty Breathing Lying Down, Leg Cramps, Palpitations, Rapid Heart Rate, Shortness of Breath and Swelling of Extremities. Gastrointestinal Not Present- Abdominal Pain, Bloating, Bloody Stool, Change in Bowel Habits, Chronic diarrhea, Constipation, Difficulty Swallowing, Excessive gas, Gets full quickly at meals, Hemorrhoids, Indigestion, Nausea, Rectal Pain and Vomiting. Female Genitourinary Not Present- Frequency, Nocturia, Painful Urination, Pelvic Pain and Urgency. Musculoskeletal Not Present- Back Pain, Joint Pain, Joint Stiffness, Muscle Pain, Muscle Weakness and Swelling of  Extremities. Neurological Not Present- Decreased Memory, Fainting, Headaches, Numbness, Seizures, Tingling, Tremor, Trouble walking and Weakness. Psychiatric Not Present- Anxiety, Bipolar, Change in Sleep Pattern, Depression, Fearful and Frequent crying. Endocrine Not Present- Cold Intolerance, Excessive Hunger, Hair Changes, Heat Intolerance, Hot flashes and New Diabetes. Hematology Not Present- Easy Bruising, Excessive bleeding, Gland problems, HIV and Persistent Infections.  Vitals Weight: 161.8 lb Height: 63in Body Surface Area: 1.77 m Body  Mass Index: 28.66 kg/m  Temp.: 97.46F(Oral)  Pulse: 71 (Regular)  BP: 122/82 (Sitting, Left Arm, Standard)  Physical Exam  General - appears comfortable, no distress; not diaphorectic  HEENT - normocephalic; sclerae clear, gaze conjugate; mucous membranes moist, dentition good; voice normal  Neck - symmetric on extension; no palpable anterior or posterior cervical adenopathy; no palpable masses in the thyroid bed  Chest - clear bilaterally without rhonchi, rales, or wheeze  Cor - regular rhythm with normal rate; no significant murmur  Ext - non-tender without significant edema or lymphedema  Neuro - grossly intact; no tremor   Assessment & Plan  PAPILLARY THYROID CARCINOMA (C73)  Patient is referred with biopsy-proven papillary thyroid carcinoma arising in the left thyroid lobe. Patient has bilateral thyroid nodules. I have recommended proceeding with total thyroidectomy.  Patient is presented with written literature on thyroid surgery to review at home.  Patient and I discussed total thyroidectomy at length. We discussed the risk and benefits including the potential for recurrent laryngeal nerve injury and injury to parathyroid glands. We discussed the location of the surgical incision. We discussed the hospital stay to be anticipated. We discussed her postoperative recovery and return to activities. We discussed the  potential need for radioactive iodine treatment. We discussed the need for lifelong thyroid hormone replacement. She understands and wishes to proceed with surgery in the near future.  The risks and benefits of the procedure have been discussed at length with the patient. The patient understands the proposed procedure, potential alternative treatments, and the course of recovery to be expected. All of the patient's questions have been answered at this time. The patient wishes to proceed with surgery.  Earnstine Regal, MD, [facility] Office: [phone]

## 2015-07-21 ENCOUNTER — Ambulatory Visit (HOSPITAL_COMMUNITY): Payer: Medicare HMO | Admitting: Certified Registered Nurse Anesthetist

## 2015-07-21 ENCOUNTER — Observation Stay (HOSPITAL_COMMUNITY)
Admission: RE | Admit: 2015-07-21 | Discharge: 2015-07-22 | Disposition: A | Discharge status: Home / Self Care | Payer: Medicare HMO | Source: Ambulatory Visit | Attending: Surgery | Admitting: Surgery

## 2015-07-21 ENCOUNTER — Encounter (HOSPITAL_COMMUNITY): Payer: Self-pay | Admitting: *Deleted

## 2015-07-21 ENCOUNTER — Encounter (HOSPITAL_COMMUNITY)
Admission: RE | Disposition: A | Discharge status: Home / Self Care | Payer: Self-pay | Source: Ambulatory Visit | Attending: Surgery

## 2015-07-21 DIAGNOSIS — G4730 Sleep apnea, unspecified: Secondary | ICD-10-CM | POA: Diagnosis not present

## 2015-07-21 DIAGNOSIS — I10 Essential (primary) hypertension: Secondary | ICD-10-CM | POA: Insufficient documentation

## 2015-07-21 DIAGNOSIS — Z7982 Long term (current) use of aspirin: Secondary | ICD-10-CM | POA: Diagnosis not present

## 2015-07-21 DIAGNOSIS — K219 Gastro-esophageal reflux disease without esophagitis: Secondary | ICD-10-CM | POA: Diagnosis not present

## 2015-07-21 DIAGNOSIS — C73 Malignant neoplasm of thyroid gland: Secondary | ICD-10-CM | POA: Diagnosis not present

## 2015-07-21 DIAGNOSIS — E041 Nontoxic single thyroid nodule: Secondary | ICD-10-CM | POA: Diagnosis present

## 2015-07-21 DIAGNOSIS — Z90710 Acquired absence of both cervix and uterus: Secondary | ICD-10-CM | POA: Diagnosis not present

## 2015-07-21 DIAGNOSIS — Z9049 Acquired absence of other specified parts of digestive tract: Secondary | ICD-10-CM | POA: Diagnosis not present

## 2015-07-21 DIAGNOSIS — Z79899 Other long term (current) drug therapy: Secondary | ICD-10-CM | POA: Insufficient documentation

## 2015-07-21 HISTORY — PX: THYROIDECTOMY: SHX17

## 2015-07-21 SURGERY — THYROIDECTOMY
Anesthesia: General | Site: Neck

## 2015-07-21 MED ORDER — SCOPOLAMINE 1 MG/3DAYS TD PT72
MEDICATED_PATCH | TRANSDERMAL | Status: AC
Start: 2015-07-21 — End: 2015-07-21
  Filled 2015-07-21: qty 1

## 2015-07-21 MED ORDER — CALCIUM CARBONATE 1250 (500 CA) MG PO TABS
2.0000 | ORAL_TABLET | Freq: Three times a day (TID) | ORAL | Status: DC
  Administered 2015-07-21 – 2015-07-22 (×2): 1000 mg via ORAL
  Filled 2015-07-21 (×5): qty 2

## 2015-07-21 MED ORDER — ROCURONIUM BROMIDE 100 MG/10ML IV SOLN
INTRAVENOUS | Status: DC | PRN
  Administered 2015-07-21: 30 mg via INTRAVENOUS

## 2015-07-21 MED ORDER — CIPROFLOXACIN IN D5W 400 MG/200ML IV SOLN
INTRAVENOUS | Status: AC
  Filled 2015-07-21: qty 200

## 2015-07-21 MED ORDER — ACETAMINOPHEN 650 MG RE SUPP: 650.0000 mg | Freq: Four times a day (QID) | RECTAL | Status: DC | PRN

## 2015-07-21 MED ORDER — CIPROFLOXACIN IN D5W 400 MG/200ML IV SOLN
400.0000 mg | INTRAVENOUS | Status: AC
  Administered 2015-07-21: 400 mg via INTRAVENOUS

## 2015-07-21 MED ORDER — SUCCINYLCHOLINE CHLORIDE 20 MG/ML IJ SOLN
INTRAMUSCULAR | Status: DC | PRN
  Administered 2015-07-21: 100 mg via INTRAVENOUS

## 2015-07-21 MED ORDER — FENTANYL CITRATE (PF) 250 MCG/5ML IJ SOLN
INTRAMUSCULAR | Status: AC
  Filled 2015-07-21: qty 5

## 2015-07-21 MED ORDER — PHENYLEPHRINE HCL 10 MG/ML IJ SOLN
INTRAMUSCULAR | Status: AC
  Filled 2015-07-21: qty 1

## 2015-07-21 MED ORDER — ONDANSETRON HCL 4 MG/2ML IJ SOLN
INTRAMUSCULAR | Status: DC | PRN
  Administered 2015-07-21: 4 mg via INTRAVENOUS

## 2015-07-21 MED ORDER — PROPOFOL 10 MG/ML IV BOLUS
INTRAVENOUS | Status: DC | PRN
  Administered 2015-07-21: 160 mg via INTRAVENOUS

## 2015-07-21 MED ORDER — FENTANYL CITRATE (PF) 100 MCG/2ML IJ SOLN
25.0000 ug | INTRAMUSCULAR | Status: DC | PRN
  Administered 2015-07-21 (×2): 25 ug via INTRAVENOUS
  Administered 2015-07-21: 50 ug via INTRAVENOUS
  Administered 2015-07-21 (×2): 25 ug via INTRAVENOUS

## 2015-07-21 MED ORDER — DEXAMETHASONE SODIUM PHOSPHATE 10 MG/ML IJ SOLN
INTRAMUSCULAR | Status: DC | PRN
  Administered 2015-07-21: 10 mg via INTRAVENOUS

## 2015-07-21 MED ORDER — SODIUM CHLORIDE 0.9 % IJ SOLN
INTRAMUSCULAR | Status: AC
  Filled 2015-07-21: qty 10

## 2015-07-21 MED ORDER — ONDANSETRON 4 MG PO TBDP: 4.0000 mg | ORAL_TABLET | Freq: Four times a day (QID) | ORAL | Status: DC | PRN

## 2015-07-21 MED ORDER — ROCURONIUM BROMIDE 100 MG/10ML IV SOLN
INTRAVENOUS | Status: AC
  Filled 2015-07-21: qty 1

## 2015-07-21 MED ORDER — ONDANSETRON HCL 4 MG/2ML IJ SOLN
INTRAMUSCULAR | Status: AC
  Filled 2015-07-21: qty 2

## 2015-07-21 MED ORDER — SUGAMMADEX SODIUM 200 MG/2ML IV SOLN
INTRAVENOUS | Status: AC
  Filled 2015-07-21: qty 2

## 2015-07-21 MED ORDER — EPHEDRINE SULFATE 50 MG/ML IJ SOLN
INTRAMUSCULAR | Status: DC | PRN
  Administered 2015-07-21: 10 mg via INTRAVENOUS

## 2015-07-21 MED ORDER — PHENYLEPHRINE 40 MCG/ML (10ML) SYRINGE FOR IV PUSH (FOR BLOOD PRESSURE SUPPORT)
PREFILLED_SYRINGE | INTRAVENOUS | Status: AC
  Filled 2015-07-21: qty 10

## 2015-07-21 MED ORDER — LACTATED RINGERS IV SOLN
INTRAVENOUS | Status: DC | PRN
  Administered 2015-07-21: 10:00:00 via INTRAVENOUS

## 2015-07-21 MED ORDER — ACETAMINOPHEN 325 MG PO TABS: 650.0000 mg | ORAL_TABLET | Freq: Four times a day (QID) | ORAL | Status: DC | PRN

## 2015-07-21 MED ORDER — PHENYLEPHRINE HCL 10 MG/ML IJ SOLN
INTRAMUSCULAR | Status: DC | PRN
Start: 2015-07-21 — End: 2015-07-21
  Administered 2015-07-21 (×3): 80 ug via INTRAVENOUS

## 2015-07-21 MED ORDER — LIDOCAINE HCL (CARDIAC) 20 MG/ML IV SOLN
INTRAVENOUS | Status: DC | PRN
  Administered 2015-07-21: 60 mg via INTRAVENOUS

## 2015-07-21 MED ORDER — PHENYLEPHRINE HCL 10 MG/ML IJ SOLN
10.0000 mg | INTRAVENOUS | Status: DC | PRN
  Administered 2015-07-21: 50 ug/min via INTRAVENOUS

## 2015-07-21 MED ORDER — LABETALOL HCL 200 MG PO TABS
200.0000 mg | ORAL_TABLET | Freq: Two times a day (BID) | ORAL | Status: DC
  Filled 2015-07-21 (×3): qty 1

## 2015-07-21 MED ORDER — SCOPOLAMINE 1 MG/3DAYS TD PT72
MEDICATED_PATCH | TRANSDERMAL | Status: DC | PRN
  Administered 2015-07-21: 1 via TRANSDERMAL

## 2015-07-21 MED ORDER — ONDANSETRON HCL 4 MG/2ML IJ SOLN
4.0000 mg | Freq: Four times a day (QID) | INTRAMUSCULAR | Status: DC | PRN
  Administered 2015-07-21: 4 mg via INTRAVENOUS
  Filled 2015-07-21: qty 2

## 2015-07-21 MED ORDER — KCL IN DEXTROSE-NACL 20-5-0.45 MEQ/L-%-% IV SOLN
INTRAVENOUS | Status: DC
  Administered 2015-07-21: 50 mL/h via INTRAVENOUS
  Administered 2015-07-22: 01:00:00 via INTRAVENOUS
  Filled 2015-07-21 (×3): qty 1000

## 2015-07-21 MED ORDER — PROPOFOL 10 MG/ML IV BOLUS
INTRAVENOUS | Status: AC
  Filled 2015-07-21: qty 20

## 2015-07-21 MED ORDER — MIDAZOLAM HCL 5 MG/5ML IJ SOLN
INTRAMUSCULAR | Status: DC | PRN
  Administered 2015-07-21: 2 mg via INTRAVENOUS

## 2015-07-21 MED ORDER — HYDROMORPHONE HCL 1 MG/ML IJ SOLN
1.0000 mg | INTRAMUSCULAR | Status: DC | PRN
  Administered 2015-07-21 (×2): 1 mg via INTRAVENOUS
  Filled 2015-07-21 (×2): qty 1

## 2015-07-21 MED ORDER — EPHEDRINE SULFATE 50 MG/ML IJ SOLN
INTRAMUSCULAR | Status: AC
  Filled 2015-07-21: qty 1

## 2015-07-21 MED ORDER — MIDAZOLAM HCL 2 MG/2ML IJ SOLN
INTRAMUSCULAR | Status: AC
  Filled 2015-07-21: qty 2

## 2015-07-21 MED ORDER — 0.9 % SODIUM CHLORIDE (POUR BTL) OPTIME
TOPICAL | Status: DC | PRN
  Administered 2015-07-21: 1000 mL

## 2015-07-21 MED ORDER — TRIAMTERENE-HCTZ 37.5-25 MG PO TABS
1.0000 | ORAL_TABLET | Freq: Every day | ORAL | Status: DC
Start: 2015-07-21 — End: 2015-07-22
  Filled 2015-07-21 (×2): qty 1

## 2015-07-21 MED ORDER — FENTANYL CITRATE (PF) 100 MCG/2ML IJ SOLN
INTRAMUSCULAR | Status: AC
  Filled 2015-07-21: qty 2

## 2015-07-21 MED ORDER — LIDOCAINE HCL (CARDIAC) 20 MG/ML IV SOLN
INTRAVENOUS | Status: AC
  Filled 2015-07-21: qty 5

## 2015-07-21 MED ORDER — HYDROCODONE-ACETAMINOPHEN 5-325 MG PO TABS
1.0000 | ORAL_TABLET | ORAL | Status: DC | PRN
  Administered 2015-07-22 (×2): 1 via ORAL
  Filled 2015-07-21 (×2): qty 1

## 2015-07-21 MED ORDER — DEXAMETHASONE SODIUM PHOSPHATE 10 MG/ML IJ SOLN
INTRAMUSCULAR | Status: AC
  Filled 2015-07-21: qty 1

## 2015-07-21 MED ORDER — SUGAMMADEX SODIUM 200 MG/2ML IV SOLN
INTRAVENOUS | Status: DC | PRN
  Administered 2015-07-21: 200 mg via INTRAVENOUS

## 2015-07-21 MED ORDER — FENTANYL CITRATE (PF) 100 MCG/2ML IJ SOLN
INTRAMUSCULAR | Status: DC | PRN
  Administered 2015-07-21 (×2): 50 ug via INTRAVENOUS

## 2015-07-21 SURGICAL SUPPLY — 38 items
APL SKNCLS STERI-STRIP NONHPOA (GAUZE/BANDAGES/DRESSINGS) ×1
ATTRACTOMAT 16X20 MAGNETIC DRP (DRAPES) ×2 IMPLANT
BENZOIN TINCTURE PRP APPL 2/3 (GAUZE/BANDAGES/DRESSINGS) ×1 IMPLANT
BLADE HEX COATED 2.75 (ELECTRODE) ×2 IMPLANT
BLADE SURG 15 STRL LF DISP TIS (BLADE) ×1 IMPLANT
BLADE SURG 15 STRL SS (BLADE) ×2
CHLORAPREP W/TINT 26ML (MISCELLANEOUS) ×2 IMPLANT
CLIP TI MEDIUM 6 (CLIP) ×5 IMPLANT
CLIP TI WIDE RED SMALL 6 (CLIP) ×5 IMPLANT
COVER SURGICAL LIGHT HANDLE (MISCELLANEOUS) ×2 IMPLANT
DISSECTOR ROUND CHERRY 3/8 STR (MISCELLANEOUS) IMPLANT
DRAPE LAPAROTOMY T 98X78 PEDS (DRAPES) ×2 IMPLANT
DRESSING SURGICEL FIBRLLR 1X2 (HEMOSTASIS) ×1 IMPLANT
DRSG SURGICEL FIBRILLAR 1X2 (HEMOSTASIS) ×2
ELECT PENCIL ROCKER SW 15FT (MISCELLANEOUS) ×2 IMPLANT
ELECT REM PT RETURN 9FT ADLT (ELECTROSURGICAL) ×2
ELECTRODE REM PT RTRN 9FT ADLT (ELECTROSURGICAL) ×1 IMPLANT
GAUZE SPONGE 4X4 12PLY STRL (GAUZE/BANDAGES/DRESSINGS) IMPLANT
GAUZE SPONGE 4X4 16PLY XRAY LF (GAUZE/BANDAGES/DRESSINGS) ×2 IMPLANT
GLOVE SURG ORTHO 8.0 STRL STRW (GLOVE) ×2 IMPLANT
GOWN STRL REUS W/TWL XL LVL3 (GOWN DISPOSABLE) ×5 IMPLANT
ILLUMINATOR WAVEGUIDE N/F (MISCELLANEOUS) ×1 IMPLANT
KIT BASIN OR (CUSTOM PROCEDURE TRAY) ×2 IMPLANT
LIQUID BAND (GAUZE/BANDAGES/DRESSINGS) ×1 IMPLANT
PACK BASIC VI WITH GOWN DISP (CUSTOM PROCEDURE TRAY) ×2 IMPLANT
SHEARS HARMONIC 9CM CVD (BLADE) ×2 IMPLANT
STAPLER VISISTAT 35W (STAPLE) IMPLANT
STRIP CLOSURE SKIN 1/2X4 (GAUZE/BANDAGES/DRESSINGS) ×2 IMPLANT
SUT MNCRL AB 4-0 PS2 18 (SUTURE) ×2 IMPLANT
SUT SILK 2 0 (SUTURE)
SUT SILK 2-0 18XBRD TIE 12 (SUTURE) IMPLANT
SUT SILK 3 0 (SUTURE)
SUT SILK 3-0 18XBRD TIE 12 (SUTURE) IMPLANT
SUT VIC AB 3-0 SH 18 (SUTURE) ×4 IMPLANT
SYR BULB IRRIGATION 50ML (SYRINGE) ×2 IMPLANT
TOWEL OR 17X26 10 PK STRL BLUE (TOWEL DISPOSABLE) ×2 IMPLANT
TOWEL OR NON WOVEN STRL DISP B (DISPOSABLE) ×2 IMPLANT
YANKAUER SUCT BULB TIP 10FT TU (MISCELLANEOUS) ×2 IMPLANT

## 2015-07-21 NOTE — Anesthesia Postprocedure Evaluation (Signed)
Anesthesia Post Note  Patient: Teresa Molina  Procedure(s) Performed: Procedure(s) (LRB): TOTAL THYROIDECTOMY (N/A)  Patient location during evaluation: PACU Anesthesia Type: General Level of consciousness: awake Pain management: pain level controlled Vital Signs Assessment: post-procedure vital signs reviewed and stable Respiratory status: spontaneous breathing Cardiovascular status: stable Anesthetic complications: no    Last Vitals:  Filed Vitals:   07/21/15 1200 07/21/15 1215  BP: 156/74 134/77  Pulse: 69 70  Temp:    Resp: 18 8    Last Pain:  Filed Vitals:   07/21/15 1225  PainSc: 7                  EDWARDS,Keeana Pieratt

## 2015-07-21 NOTE — Interval H&P Note (Signed)
History and Physical Interval Note:  07/21/2015 9:52 AM  Teresa Molina  has presented today for surgery, with the diagnosis of Papillary thyroid carcinoma.   The various methods of treatment have been discussed with the patient and family. After consideration of risks, benefits and other options for treatment, the patient has consented to    Procedure(s): TOTAL THYROIDECTOMY (N/A) as a surgical intervention .    The patient's history has been reviewed, patient examined, no change in status, stable for surgery.  I have reviewed the patient's chart and labs.  Questions were answered to the patient's satisfaction.    Earnstine Regal, MD, [facility] Office: [place]

## 2015-07-21 NOTE — Anesthesia Preprocedure Evaluation (Signed)
Anesthesia Evaluation  Patient identified by MRN, date of birth, ID band Patient awake    Reviewed: Allergy & Precautions, NPO status , Patient's Chart, lab work & pertinent test results  History of Anesthesia Complications (+) PONV  Airway Mallampati: II   Neck ROM: Full    Dental   Pulmonary sleep apnea ,    breath sounds clear to auscultation       Cardiovascular hypertension,  Rhythm:Regular Rate:Normal     Neuro/Psych    GI/Hepatic Neg liver ROS, GERD  ,  Endo/Other  negative endocrine ROS  Renal/GU negative Renal ROS     Musculoskeletal   Abdominal   Peds  Hematology   Anesthesia Other Findings   Reproductive/Obstetrics                             Anesthesia Physical Anesthesia Plan  ASA: III  Anesthesia Plan: General   Post-op Pain Management:    Induction: Intravenous  Airway Management Planned: Oral ETT  Additional Equipment:   Intra-op Plan:   Post-operative Plan: Extubation in OR  Informed Consent: I have reviewed the patients History and Physical, chart, labs and discussed the procedure including the risks, benefits and alternatives for the proposed anesthesia with the patient or authorized representative who has indicated his/her understanding and acceptance.   Dental advisory given  Plan Discussed with:   Anesthesia Plan Comments:         Anesthesia Quick Evaluation

## 2015-07-21 NOTE — Transfer of Care (Signed)
Immediate Anesthesia Transfer of Care Note  Patient: Teresa Molina  Procedure(s) Performed: Procedure(s): TOTAL THYROIDECTOMY (N/A)  Patient Location: PACU  Anesthesia Type:General  Level of Consciousness:  sedated, patient cooperative and responds to stimulation  Airway & Oxygen Therapy:Patient Spontanous Breathing and Patient connected to face mask oxgen  Post-op Assessment:  Report given to PACU RN and Post -op Vital signs reviewed and stable  Post vital signs:  Reviewed and stable  Last Vitals:  Filed Vitals:   07/21/15 0746 07/21/15 1148  BP: 138/74 152/77  Pulse: 75 72  Temp: 36.4 C 36.4 C  Resp: 16 15    Complications: No apparent anesthesia complications

## 2015-07-21 NOTE — Op Note (Signed)
Procedure Note  Pre-operative Diagnosis:  Papillary thyroid carcinoma  Post-operative Diagnosis:  same  Surgeon:  Earnstine Regal, MD, FACS  Assistant:  none   Procedure:  Total thyroidectomy  Anesthesia:  General  Estimated Blood Loss:  minimal  Drains: none         Specimen: thyroid to pathology  Indications:  Patient referred by Dr. Philemon Kingdom with newly diagnosed papillary thyroid carcinoma. Patient had been noted on routine carotid duplex to have thyroid nodules. She subsequently underwent thyroid ultrasound on May 23, 2015 showing a normal-sized thyroid gland containing small nodules bilaterally. The largest nodule was in the mid left lobe measuring 10 mm in greatest dimension with possible microcalcifications. Biopsy was recommended. Fine-needle aspiration biopsy was performed on June 07, 2015. Cytopathology was positive for papillary thyroid carcinoma. Patient is now referred for consideration for thyroidectomy.   Procedure Details: Procedure was done in OR #3 at the [facility].  The patient was brought to the operating room and placed in a supine position on the operating room table.  Following administration of general anesthesia, the patient was positioned and then prepped and draped in the usual aseptic fashion.  After ascertaining that an adequate level of anesthesia had been achieved, a Kocher incision was made with #15 blade.  Dissection was carried through subcutaneous tissues and platysma. Hemostasis was achieved with the electrocautery.  Skin flaps were elevated cephalad and caudad from the thyroid notch to the sternal notch.  The [place] self-retaining retractor was placed for exposure.  Strap muscles were incised in the midline and dissection was begun on the left side.  Strap muscles were reflected laterally.  Left thyroid lobe was small with a dominant hard nodule at the inferior pole.  The inferior parathyroid gland or possibly a lymph node  was intimately apposed to the nodule.  It was resected with the nodule.  The left lobe was gently mobilized with blunt dissection.  Superior pole vessels were dissected out and divided individually between small and medium Ligaclips with the Harmonic scalpel.  The thyroid lobe was rolled anteriorly.  Branches of the inferior thyroid artery were divided between small Ligaclips with the Harmonic scalpel.  Inferior venous tributaries were divided between Ligaclips.  Both the superior and inferior parathyroid glands were identified and preserved on their vascular pedicles.  The recurrent laryngeal nerve was identified and preserved along its course.  The ligament of Gwenlyn Found was released with the electrocautery and the gland was mobilized onto the anterior trachea. Isthmus was mobilized across the midline.  There was very small pyramidal lobe present which was resected with the isthmus.  Dry pack was placed in the left neck.  Next, the right thyroid lobe was gently mobilized with blunt dissection.  Right thyroid lobe was small and normal in appearance.  Superior pole vessels were dissected out and divided between small and medium Ligaclips with the Harmonic scalpel.  Superior parathyroid was identified and preserved.  Inferior venous tributaries were divided between medium Ligaclips with the Harmonic scalpel.  The right thyroid lobe was rolled anteriorly and the branches of the inferior thyroid artery divided between small Ligaclips.  The right recurrent laryngeal nerve was identified and preserved along its course.  The ligament of Gwenlyn Found was released with the electrocautery.  The right thyroid lobe was mobilized onto the anterior trachea and the remainder of the thyroid was dissected off the anterior trachea and the thyroid was completely excised.  A suture was used to mark the left  lobe. The entire thyroid gland was submitted to pathology for review.  The neck was irrigated with warm saline.  Fibrillar was placed  throughout the operative field.  Strap muscles were reapproximated in the midline with interrupted 3-0 Vicryl sutures.  Platysma was closed with interrupted 3-0 Vicryl sutures.  Skin was closed with a running 4-0 Monocryl subcuticular suture.  Wound was washed and dried and benzoin and steri-strips were applied.  Dry gauze dressing was placed.  The patient was awakened from anesthesia and brought to the recovery room.  The patient tolerated the procedure well.   Earnstine Regal, MD, [facility] Office: [phone]

## 2015-07-21 NOTE — Anesthesia Procedure Notes (Signed)
Procedure Name: Intubation Date/Time: 07/21/2015 10:10 AM Performed by: West Pugh Pre-anesthesia Checklist: Patient identified, Emergency Drugs available, Suction available, Patient being monitored and Timeout performed Patient Re-evaluated:Patient Re-evaluated prior to inductionOxygen Delivery Method: Circle system utilized Preoxygenation: Pre-oxygenation with 100% oxygen Intubation Type: IV induction Ventilation: Mask ventilation without difficulty Laryngoscope Size: Glidescope and 4 Grade View: Grade II Tube type: Oral Number of attempts: 4 Airway Equipment and Method: Stylet and Video-laryngoscopy Placement Confirmation: ETT inserted through vocal cords under direct vision,  positive ETCO2,  CO2 detector and breath sounds checked- equal and bilateral Secured at: 21 cm Tube secured with: Tape Dental Injury: Teeth and Oropharynx as per pre-operative assessment  Comments: DL X 2 by CRNA with MAC 3 and unsuccessful, Anesthesiologist DL X 1 attempt without success with miller. Glidescope utilized and successful with one attempt. No dental damage

## 2015-07-22 ENCOUNTER — Encounter (HOSPITAL_COMMUNITY): Payer: Self-pay | Admitting: Surgery

## 2015-07-22 DIAGNOSIS — C73 Malignant neoplasm of thyroid gland: Secondary | ICD-10-CM | POA: Diagnosis not present

## 2015-07-22 LAB — BASIC METABOLIC PANEL
Anion gap: 7 (ref 5–15)
BUN: 19 mg/dL (ref 6–20)
CALCIUM: 7.4 mg/dL — AB (ref 8.9–10.3)
CO2: 26 mmol/L (ref 22–32)
Chloride: 103 mmol/L (ref 101–111)
Creatinine, Ser: 0.89 mg/dL (ref 0.44–1.00)
GFR calc Af Amer: >60 mL/min (ref >60)
Glucose, Bld: 165 mg/dL — ABNORMAL HIGH (ref 65–99)
Potassium: 3.9 mmol/L (ref 3.5–5.1)
Sodium: 142 mmol/L (ref 135–145)

## 2015-07-22 MED ORDER — CALCIUM CARBONATE 1250 (500 CA) MG PO TABS: 2.0000 | ORAL_TABLET | Freq: Three times a day (TID) | ORAL | Status: DC

## 2015-07-22 MED ORDER — SYNTHROID 88 MCG PO TABS: 88.0000 ug | ORAL_TABLET | Freq: Every day | ORAL | Status: DC

## 2015-07-22 MED ORDER — HYDROCODONE-ACETAMINOPHEN 5-325 MG PO TABS: 1.0000 | ORAL_TABLET | ORAL | Status: DC | PRN

## 2015-07-22 MED ORDER — SODIUM CHLORIDE 0.9 % IV SOLN
2.0000 g | INTRAVENOUS | Status: AC
  Administered 2015-07-22: 2 g via INTRAVENOUS
  Filled 2015-07-22: qty 20

## 2015-07-22 NOTE — Discharge Summary (Signed)
Physician Discharge Summary [facility]  Patient ID: Teresa Molina MRN: [id number] DOB/AGE: Jul 14, 1945 70 y.o.  Admit date: 07/21/2015 Discharge date: 07/22/2015  Admission Diagnoses:  Papillary thyroid carcinoma  Discharge Diagnoses:  Principal Problem:   Papillary thyroid carcinoma [facility]) Active Problems:   Thyroid nodule   Discharged Condition: good  Hospital Course: Patient was admitted for observation following thyroid surgery.  Post op course was uncomplicated.  Pain was well controlled.  Tolerated diet.  Post op calcium level on morning following surgery was 7.4 mg/dl.  Patient was administered IV calcium gluconate 2 gm prior to discharge home.  Patient was prepared for discharge home on POD#1.  Consults: None  Treatments: surgery: total thyroidectomy  Discharge Exam: Blood pressure 112/46, pulse 90, temperature 98.8 F (37.1 C), temperature source Oral, resp. rate 12, height 5\' 3"  (1.6 m), weight 73.936 kg (163 lb), SpO2 96 %. HEENT - clear Neck - wound dry and intact; minimal STS; voice normal Chest - clear bilaterally Cor - RRR  Disposition: Home  Discharge Instructions    Apply dressing    Complete by:  As directed   Apply light gauze dressing to wound before discharge home today.     Diet - low sodium heart healthy    Complete by:  As directed      Discharge instructions    Complete by:  As directed   [facility]  THYROID & PARATHYROID SURGERY:  POST-OP INSTRUCTIONS  Always review your discharge instruction sheet from the facility where your surgery was performed.  A prescription for pain medication may be given to you upon discharge.  Take your pain medication as prescribed.  If narcotic pain medicine is not needed, then you may take acetaminophen (Tylenol) or ibuprofen (Advil) as needed.  Take your usually prescribed medications unless otherwise directed.  If you need a refill on your pain medication, please contact  your pharmacy. They will contact our office to request authorization.  Prescriptions will not be processed by our office after 5 pm or on weekends.  Start with a light diet upon arrival home, such as soup and crackers or toast.  Be sure to drink plenty of fluids daily.  Resume your normal diet the day after surgery.  Most patients will experience some swelling and bruising on the chest and neck area.  Ice packs will help.  Swelling and bruising can take several days to resolve.   It is common to experience some constipation after surgery.  Increasing fluid intake and taking a stool softener will usually help or prevent this problem.  A mild laxative (Milk of Magnesia or Miralax) should be taken according to package directions if there has been no bowel movement after 48 hours.  You have steri-strips and a gauze dressing over your incision.  You may remove the gauze bandage on the second day after surgery, and you may shower at that time.  Leave your steri-strips (small skin tapes) in place directly over the incision.  These strips should remain on the skin for 5-7 days and then be removed.  You may get them wet in the shower and pat them dry.  You may resume regular (light) daily activities beginning the next day - such as daily self-care, walking, climbing stairs - gradually increasing activities as tolerated.  You may have sexual intercourse when it is comfortable.  Refrain from any heavy lifting or straining until approved by your doctor.  You may drive when you no  longer are taking prescription pain medication, you can comfortably wear a seatbelt, and you can safely maneuver your car and apply brakes.  You should see your doctor in the office for a follow-up appointment approximately two to three weeks after your surgery.  Make sure that you call for this appointment within a day or two after you arrive home to insure a convenient appointment time.  WHEN TO CALL YOUR DOCTOR: -- Fever greater than  101.5 -- Inability to urinate -- Nausea and/or vomiting - persistent -- Extreme swelling or bruising -- Continued bleeding from incision -- Increased pain, redness, or drainage from the incision -- Difficulty swallowing or breathing -- Muscle cramping or spasms -- Numbness or tingling in hands or around lips  The clinic staff is available to answer your questions during regular business hours.  Please don't hesitate to call and ask to speak to one of the nurses if you have concerns.  Earnstine Regal, MD, [facility] Office: [phone]  Website: [email or web address]     Increase activity slowly    Complete by:  As directed      Remove dressing in 24 hours    Complete by:  As directed             Medication List    TAKE these medications        aspirin 81 MG tablet  Take 81 mg by mouth daily.     calcium carbonate 1250 (500 Ca) MG tablet  Commonly known as:  OS-CAL - dosed in mg of elemental calcium  Take 2 tablets (1,000 mg of elemental calcium total) by mouth 3 (three) times daily with meals.     clonazePAM 1 MG tablet  Commonly known as:  KLONOPIN  1/2 or 1 at bedtime as needed for sleep     ezetimibe-simvastatin 10-20 MG tablet  Commonly known as:  VYTORIN  Take 1 tablet by mouth at bedtime.     HYDROcodone-acetaminophen 5-325 MG tablet  Commonly known as:  NORCO/VICODIN  Take 1-2 tablets by mouth every 4 (four) hours as needed for moderate pain.     ibuprofen 200 MG tablet  Commonly known as:  ADVIL,MOTRIN  Take 200 mg by mouth as needed for moderate pain. Reported on 05/11/2015     labetalol 200 MG tablet  Commonly known as:  NORMODYNE  Take 1 tablet (200 mg total) by mouth 2 (two) times daily.     RABEprazole 20 MG tablet  Commonly known as:  ACIPHEX  Take 20 mg by mouth daily.     SYNTHROID 88 MCG tablet  Generic drug:  levothyroxine  Take 1 tablet (88 mcg total) by mouth daily before  breakfast.     triamterene-hydrochlorothiazide 37.5-25 MG tablet  Commonly known as:  MAXZIDE-25  Take 1 tablet by mouth daily.           Follow-up Information    Follow up with Earnstine Regal, MD. Schedule an appointment as soon as possible for a visit in 3 weeks.   Specialty:  General Surgery   Why:  For wound re-check   Contact information:   [place] [address] [phone]       Earnstine Regal, MD, [facility] Office: [phone]   Signed: Earnstine Regal 07/22/2015, 6:46 AM

## 2015-08-04 DIAGNOSIS — C73 Malignant neoplasm of thyroid gland: Secondary | ICD-10-CM | POA: Diagnosis not present

## 2015-08-08 ENCOUNTER — Encounter: Payer: Self-pay | Admitting: Internal Medicine

## 2015-08-08 ENCOUNTER — Ambulatory Visit (INDEPENDENT_AMBULATORY_CARE_PROVIDER_SITE_OTHER): Payer: Medicare HMO | Admitting: Internal Medicine

## 2015-08-08 VITALS — BP 113/62 | HR 73 | Temp 98.2°F | Resp 12 | Wt 165.0 lb

## 2015-08-08 DIAGNOSIS — C73 Malignant neoplasm of thyroid gland: Secondary | ICD-10-CM | POA: Diagnosis not present

## 2015-08-08 DIAGNOSIS — E890 Postprocedural hypothyroidism: Secondary | ICD-10-CM

## 2015-08-08 NOTE — Progress Notes (Signed)
Patient ID: Teresa Molina, female   DOB: [birth year], 70 y.o.   MRN: [id number]   HPI  Teresa Molina is a 70 y.o.-year-old female, referred by Dr. Audie Box, for evaluation for a left thyroid nodule. PCP: Dr. Wylene Simmer.  Reviewed history: Patient has been found to have an incidental thyroid nodule during a recent carotid ultrasound, on 03/10/2015. The nodule was seen in the left lobe and measured 0.9 x 0.8 x 0.9 centimeters.  At last visit, we checked a thyroid U/S (05/23/2015):  R Left thyroid lobe  Measurements: 4.0 x 1.0 x 1.3 cm. Scattered tiny small hypoechoic cystic nodules all measuring 3 mm or less in size. Left lower pole solid hypoechoic nodule measures 10 x 8 x 8 mm, nonspecific. There appears to be minor associated microcalcification.             L nodule was small, but hypoechoic and with possible small calcifications >> I suggested FNA.  Adequacy Reason Satisfactory For Evaluation. Diagnosis THYROID, LEFT LOBE INFERIOR, FINE NEEDLE ASPIRATION (SPECIMEN 1 OF 1, COLLECTED ON 06/07/2015): POSITIVE FOR PAPILLARY THYROID CARCINOMA ([place] CATEGORY VI). Zandra Abts MD Pathologist, Electronic Signature (Case signed 06/08/2015) Specimen Clinical Information Left lower pole solid hypoechoic nodule measures 10 x 8 x 8 mm, nonspecific, There appears to be minor associated microcalcification Source Thyroid, Fine Needle Aspiration, Left Lobe Inferior, (Specimen 1 of 1, collected on 06/07/15 )  Patient had total thyroidectomy by Dr. Gerrit Friends on 07/21/2015. Final pathology showed: Diagnosis Thyroid, thyroidectomy, total - PAPILLARY CARCINOMA, CLASSIC VARIANT, SPANNING 1.1 CM. - EXTRATHYROIDAL EXTENSION PRESENT. - RESECTION MARGINS ARE NEGATIVE. - NO LYMPHOVASCULAR INVASION. - PARATHYROID TISSUE PRESENT. - SEE ONCOLOGY TABLE. Microscopic Comment THYROID Specimen: Total thyroid. Procedure (including lymph node sampling if applicable): Total thyroidectomy. Specimen  Integrity (intact/fragmented): Intact. Tumor focality: Unifocal. Dominant tumor: Maximum tumor size (cm): 1.1 cm. Tumor laterality: Left. Histologic type (including subtype and/or unique features as applicable): Papillary carcinoma, classic variant. Tumor capsule: Partial. Extrathyroidal extension: Present. Capsular invasion with degree of invasion if present: N/A. Margins: Negative. Lymphatic or vascular invasion: Not identified. Lymph nodes: # examined 0; # positive; 0 TNM code: pT3, pNX Non-neoplastic thyroid: Nodular hyperplasia, benign parathyroid tissue.  She is now on Synthroid 88 mcg: - in am - with water - waits 2h after this to eat b'fast - on calcium carbonate B, L, D - Aciphex usually late in the afternoon  I reviewed pt's thyroid tests: 03/07/2015: TSH 3.22, fT4 0.99   She had a Calcium of 7.4 postop >> started CaCO3 >> this is adjusted by Dr Gerrit Friends, with whom patient has an appointment tomorrow.  Pt denies: - feeling nodules in neck - hoarseness - dysphagia - choking - SOB with lying down After the surgery, does not have pain at the surgical site, and also no dysesthesia. She does have swelling.  Pt mentions: - + Weight gain - + Hot flashes  - no fatigue - No  tremors -  no palpitations -  No anxiety/depression -  no hyperdefecation/constipation  No FH of thyroid ds. No FH of thyroid cancer. No h/o radiation tx to head or neck.  No seaweed or kelp. No recent contrast studies. No steroid use. No herbal supplements. No Biotin supplements or Hair, Skin and Nails vitamins.  She has a history of HTN, HL, GERD, and sleep apnea.  ROS: Constitutional:See history of present illness  Eyes: no blurry vision, no xerophthalmia ENT: no sore throat, no nodules palpated in throat, no dysphagia/odynophagia, no hoarseness  Cardiovascular: no CP/SOB/palpitations/leg swelling Respiratory: no cough/SOB Gastrointestinal: no N/V/D/C Musculoskeletal: no muscle/joint  aches Skin: no rashes Neurological: no tremors/numbness/tingling/dizziness  I reviewed pt's medications, allergies, PMH, social hx, family hx, and changes were documented in the history of present illness. Otherwise, unchanged from my initial visit note.  Past Medical History  Diagnosis Date  . ALLERGIC RHINITIS   . Hypertension   . Insomnia   . Allergic conjunctivitis   . GERD (gastroesophageal reflux disease)   . Esophageal reflux   . Food allergy     angioedema > strawberries  . Breast cyst     left breast - ultrasound benign 2010  . PONV (postoperative nausea and vomiting)   . OSA on CPAP     NPSG 08-03-09: AHI 17.6; CPAP 12/ AHI 0; PLMA- mild does not wear cpap    Past Surgical History  Procedure Laterality Date  . Tonsillectomy  1952 - approximate  . Rotator cuff repair  2006  . Total abdominal hysterectomy  1978  . Cholecystectomy  1991  . Breast surgery  2013    Rt breast mass excision  . Dilation and curettage of uterus    . Thyroidectomy N/A 07/21/2015    Procedure: TOTAL THYROIDECTOMY;  Surgeon: Armandina Gemma, MD;  Location: [facility];  Service: General;  Laterality: N/A;   Social History   Social History  . Marital Status: Divorced    Spouse Name: N/A  . Number of Children: 2   Occupational History  . 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 Smoking status: Never Smoker   . Smokeless tobacco: Never Used  . Alcohol Use: 0.0 oz/week    0 Standard drinks or equivalent per week     Comment: RARELY  . Drug Use: No   Current Outpatient Prescriptions on File Prior to Visit  Medication Sig Dispense Refill  . aspirin 81 MG tablet Take 81 mg by mouth daily.     . calcium carbonate (OS-CAL - DOSED IN MG OF ELEMENTAL CALCIUM) 1250 (500 Ca) MG tablet Take 2 tablets (1,000 mg of elemental calcium total) by mouth 3 (three) times daily with meals. 90 tablet 1  . clonazePAM  (KLONOPIN) 1 MG tablet 1/2 or 1 at bedtime as needed for sleep 30 tablet 5  . ezetimibe-simvastatin (VYTORIN) 10-20 MG per tablet Take 1 tablet by mouth at bedtime. 30 tablet 0  . HYDROcodone-acetaminophen (NORCO/VICODIN) 5-325 MG tablet Take 1-2 tablets by mouth every 4 (four) hours as needed for moderate pain. 20 tablet 0  . ibuprofen (ADVIL,MOTRIN) 200 MG tablet Take 200 mg by mouth as needed for moderate pain. Reported on 05/11/2015    . labetalol (NORMODYNE) 200 MG tablet Take 1 tablet (200 mg total) by mouth 2 (two) times daily. 60 tablet 0  . RABEprazole (ACIPHEX) 20 MG tablet Take 20 mg by mouth daily.     Marland Kitchen SYNTHROID 88 MCG tablet Take 1 tablet (88 mcg total) by mouth daily before breakfast. 30 tablet 3  . triamterene-hydrochlorothiazide (MAXZIDE-25) 37.5-25 MG per tablet Take 1 tablet by mouth daily. 30 tablet 0   No current facility-administered medications on file prior to visit.   Allergies  Allergen Reactions  . Amlodipine Swelling  . Diovan [Valsartan] Swelling  . Lipitor [Atorvastatin Calcium] Other (See Comments)    Caused muscle paralysis in legs  . Codeine Nausea And Vomiting  . Compazine [  Prochlorperazine Edisylate] Other (See Comments)    Facial  muscle spasms   . Reglan [Metoclopramide] Other (See Comments)    Facial muscle spasms  . Benadryl [Diphenhydramine Hcl] Palpitations  . Penicillins Other (See Comments)    Yeast infections   Family History  Problem Relation Age of Onset  . Other Father     brain tumor  . COPD Mother    PE: BP 113/62 mmHg  Pulse 73  Temp(Src) 98.2 F (36.8 C) (Oral)  Resp 12  Wt 165 lb (74.844 kg)  SpO2 96% Body mass index is 29.24 kg/(m^2). Wt Readings from Last 3 Encounters:  08/08/15 165 lb (74.844 kg)  07/21/15 163 lb (73.936 kg)  07/13/15 163 lb (73.936 kg)   Constitutional: overweight, in NAD Eyes: PERRLA, EOMI, no exophthalmos ENT: moist mucous membranes, Thyroid scar appears swollen, but without fluctuance,  erythema, or pain on palpation, no cervical lymphadenopathy;  Cardiovascular: RRR, No MRG Respiratory: CTA B Gastrointestinal: abdomen soft, NT, ND, BS+ Musculoskeletal: no deformities, strength intact in all 4;  Skin: moist, warm, no rashes Neurological: no tremor with outstretched hands, DTR normal in all 4  ASSESSMENT: 1. Papillary thyroid cancer  2. Postsurgical hypothyroidism  PLAN: 1. PTC - Reviewed the surgical pathology along with the patient. She has classical PTC, which is small, 1.1, and unilateral. There is no lymphovascular invasion, however, there has been extrathyroidal extension. Because of this, her tumor is considered intermediate risk, rather than low risk. In this case, I suggested radioactive iodine treatment, which she agrees with. Due to the fact that she is not high risk, I suggested that her RAI treatment be done with Thyrogen. We decided to obtain this approximately 2 months after her surgery. She will give me a call in May of this year to order the treatment. I plan to see her back after her RAI treatment and whole-body scan, at the end of June. I plan to check a thyroglobulin level at that time. - We  again discussed about thyroid cancer in general, the fact that it is usually a benign, indolent cancer, which most likely will not affect her life expectancy, however, if he requires monitoring.   2. Postsurgical hypothyroidism - We discussed about her levothyroxine supplementation. She is currently are Synthroid 88 g daily. - We discussed about the need to take the thyroid hormone every day, with water, at least 30 minutes before breakfast, separated by at least 4 hours from: - acid reflux medications - calcium - iron - multivitamins She is not taking it correctly, however, she is eating breakfast 2 hours after the levothyroxine and taking the first dose of calcium with this meal. I advised her to try to separate the calcium from levothyroxine by at least 4 hours.  I think Dr.Gerkin may reduce her calcium dosing from 3-2 times a day tomorrow and, in this case, she can take the calcium with lunch and dinner. - She will come back in 3 weeks for a TSH and a free T4

## 2015-08-08 NOTE — Patient Instructions (Signed)
Please continue Synthroid 88 mcg daily.  Take the thyroid hormone every day, with water, at least 30 minutes before breakfast, separated by at least 4 hours from: - acid reflux medications - calcium - iron - multivitamins  Please come back for labs in ~3 weeks and for a visit in 3 months.  Please send me a message in May to order the radioactive iodine treatment.

## 2015-08-23 ENCOUNTER — Ambulatory Visit: Payer: Medicare HMO | Admitting: Internal Medicine

## 2015-08-29 ENCOUNTER — Other Ambulatory Visit (INDEPENDENT_AMBULATORY_CARE_PROVIDER_SITE_OTHER): Payer: Medicare HMO

## 2015-08-29 DIAGNOSIS — E890 Postprocedural hypothyroidism: Secondary | ICD-10-CM | POA: Diagnosis not present

## 2015-08-29 LAB — TSH: TSH: 0.98 u[IU]/mL (ref 0.35–4.50)

## 2015-08-29 LAB — T4, FREE: Free T4: 1.28 ng/dL (ref 0.60–1.60)

## 2015-09-06 ENCOUNTER — Encounter: Payer: Self-pay | Admitting: Internal Medicine

## 2015-09-06 ENCOUNTER — Ambulatory Visit (INDEPENDENT_AMBULATORY_CARE_PROVIDER_SITE_OTHER): Payer: Medicare HMO | Admitting: Internal Medicine

## 2015-09-06 VITALS — BP 120/62 | HR 64 | Ht 62.0 in | Wt 166.2 lb

## 2015-09-06 DIAGNOSIS — J302 Other seasonal allergic rhinitis: Secondary | ICD-10-CM

## 2015-09-06 DIAGNOSIS — G4733 Obstructive sleep apnea (adult) (pediatric): Secondary | ICD-10-CM

## 2015-09-06 DIAGNOSIS — G4700 Insomnia, unspecified: Secondary | ICD-10-CM

## 2015-09-06 DIAGNOSIS — J309 Allergic rhinitis, unspecified: Secondary | ICD-10-CM

## 2015-09-06 DIAGNOSIS — J3089 Other allergic rhinitis: Principal | ICD-10-CM

## 2015-09-06 NOTE — Assessment & Plan Note (Signed)
Chronic nonspecific insomnia complicated by stress from work and health problems. She is not currently using sleep med although we have provided several over time. Appropriate sleep habits, cautious use of and daytime caffeine if needed to help stabilize sleep wake rhythm. Melatonin.

## 2015-09-06 NOTE — Assessment & Plan Note (Signed)
Conservative measures only-sleep off flat of back and seek weight loss

## 2015-09-06 NOTE — Assessment & Plan Note (Signed)
Adequately controlled mostly by avoiding outdoor pollens at this time

## 2015-09-06 NOTE — Progress Notes (Signed)
09/25/12-66 yoF never smoker followed here for allergic rhinitis/ conjunctivitis, and for insomnia with obstructive sleep apnea/ failed CPAP and Provent, complicated by history of migraine, hypertension, GERD. FOLLOWS FOR: watery eyes, slight cough, feels stuffy from pollen Chronic insomnia pattern is not really changed. She has been under some stress recently related to her job for the [employer]  12/03/12- 66 yoF never smoker followed here for allergic rhinitis/ conjunctivitis, and for insomnia with obstructive sleep apnea/ failed CPAP and Provent, complicated by history of migraine, hypertension, GERD. FOLLOWS FOR: having stuffiness at times; other wise no cough, SOB, wheezing, or congestion. Chronic insomnia pattern is up and down but persistent. Clonazepam 0.5 mg has help when used occasionally. Sleep apnea status is uncertain but not obtrusive. She lives alone. Treatments all interfered with insomnia more than they helped. Occasional rhinitis, mostly treated OTC  02/06/13- 66 yoF never smoker followed here for allergic rhinitis/ conjunctivitis, and for insomnia with obstructive sleep apnea/ failed CPAP and Provent, complicated by history of migraine, hypertension, GERD. FOLLOWS FOR: denies any wheezing, SOB, cough, or congestion. Still having leg jerks and just not sleeping She lives alone and admits she"doesn't like night time and doesn't want to go to sleep". We discussed her experience with meds and she is willing to try Xanax plus Amitriptyline at bedtime.  04/10/13- 66 yoF never smoker followed here for allergic rhinitis/ conjunctivitis, and for insomnia with obstructive sleep apnea/ failed CPAP and Provent, complicated by history of migraine, hypertension, GERD FOLLOWS FOR:  Breathing doing well.  Still having trouble sleeping ,4 hours per night.  Not using CPAP. She did not follow through with the cognitive behavioral therapy or oral appliance suggestions. We discussed  medications and her lifestyle. She lives alone.  06/10/13- 66 yoF never smoker followed here for allergic rhinitis/ conjunctivitis, and for insomnia with obstructive sleep apnea/ failed CPAP and Provent, complicated by history of migraine, hypertension, GERD FOLLOWS FOR: sleeps for about 4.5 hours each night; still not using CPAP -can not stand it. She has tried CPAP and BiPAP, been educated on oral appliances and the importance of maintaining normal body weight. Her primary sleep complaint is insomnia and sleep apnea interventions have made that worse. She feels her sleep status is stable and she lives with it. Educated again on sleep hygiene. Bothered recently by watery rhinorrhea. Discussed antihistamines. Occasional soreness inside nostrils from wiping her nose, using Neosporin.  09/10/13- 70 yoF never smoker followed here for allergic rhinitis/ conjunctivitis, and for insomnia with obstructive sleep apnea/ failed CPAP and Provent, complicated by history of migraine, hypertension, GERD Minor seasonal stuffiness, no infection or chest discomfort. Antihistamines if needed. Sleep- bedtime 1:30-6:30 or 7:00AM. Admits tired. Discussed sleep hygiene.   12/31/13- 70 yoF never smoker followed here for allergic rhinitis/ conjunctivitis, and for insomnia with obstructive sleep apnea/ failed CPAP and Provent, complicated by history of migraine, hypertension, GERD  03/03/14-  67 yoF never smoker followed here for allergic rhinitis/ conjunctivitis, and for insomnia with obstructive sleep apnea/ failed CPAP and Provent, complicated by history of migraine, hypertension, GERD FOLLOWS FOR: Patient still not wearing CPAP. She has cough for past 2 weeks. She is having greenish phlegm production sometime.  05/06/14- 70 yoF never smoker followed here for allergic rhinitis/ conjunctivitis, and for insomnia with obstructive sleep apnea/ failed CPAP and Provent, complicated by history of migraine, hypertension,  GERD Intervals  with good sleep are still infrequent. Overall pattern has not changed No recent cough or wheeze  2/10//16-  70 yoF never smoker followed here for allergic rhinitis/ conjunctivitis, and for insomnia with obstructive sleep apnea/ failed CPAP and Provent, complicated by history of migraine, hypertension, GERD, rectal bleeding Acute visit-sick for 5 days with sore throat, malaise, painful dry cough, scant green sputum, hoarseness. Had flu shot. Myalgias. No fever or blood and no swollen glands.  07/09/14- 70 yoF never smoker followed here for allergic rhinitis/ conjunctivitis, and for insomnia with obstructive sleep apnea/ failed CPAP and Provent, complicated by history of migraine, hypertension, GERD FOLLOWS FOR: Continues to have cough and raspy voice but feels much better. Slowly resolving bronchitis. Z-Pak helped some but she asks about a repeat. Sputum is clear or, no fever.  09/09/14- 70 yoF never smoker followed here for allergic rhinitis/ conjunctivitis, and for insomnia with obstructive sleep apnea/ failed CPAP and Provent, complicated by history of migraine, hypertension, GERD FOLLOWS FOR: Not sleeping well,sleepy.  12/29/14- 70 yoF never smoker followed here for allergic rhinitis/ conjunctivitis, and for insomnia with obstructive sleep apnea/ failed CPAP and Provent, complicated by history of migraine, hypertension, GERD FOLLOWS FOR: Pt states she is not feeling well; head hurts, chest hurts, ear pain, sore throat, ? fevers.  Has been working hard for [employer] in this election season and has gotten acute infection. Began as an upper respiratory infection with headache, earache, sore throat then chest congestion. Sputum now turning green, low-grade fever, increased cough. Evaluated by Dr. Charolotte Capuchin for acute rectal bleeding attributed to internal hemorrhoids. Dieting weight down for blood sugar. She has not yet tried Belsomra for sleep, waiting to feel  better.  03/03/15- 70 yoF never smoker followed here for allergic rhinitis/ conjunctivitis, and for insomnia with obstructive sleep apnea/ failed CPAP and Provent, complicated by history of migraine, hypertension, GERD FOLLOWS FOR: Pt denies any SOB, wheezing, cough or congestion; continues to have trouble with insomnia-has not given Belsomra a "fair" chance. She is extremely busy now in her job with the [employer]. That stress feeds her insomnia. She hasn't wanted to risk trying a new medication until after the election. No significant nasal congestion or drainage. Occasional antihistamine.  07/05/2015-70 year old female never smoker followed here for allergic rhinitis/conjunctivitis, insomnia, OSA/failed CPAP and Provent nasal valves, complicated by history of migraine, hypertension, GERD, thyroid cancer FOLLOWS FOR: Pt states she is having hard sleeping at night; had alot of other issues going on. Now pending thyroidectomy after recently diagnosed thyroid cancer. Her chronic insomnia problems are consequently worse. We reviewed her previous medication experience and options. She feels she did best in the past with clonazepam.  09/06/2015-70 year old female never smoker followed for allergic rhinitis/conjunctivitis, insomnia, OSA/failed CPAP and Provent nasal valves, complicated by history of migraine, hypertension, GERD, thyroid cancer FOLLOWS [id number] ca surg.(07-21-15).Not using CPAP.Sleep hard to assess-due to adjusting since sx She has never slept well, never comfortable with CPAP, never sought oral appliance or cognitive behavioral therapy. Now additional stress related to cancer diagnosis and changing thyroid hormone status. We talked about basic sleep hygiene, body weight, ways to stabilize nighttime sleep and daytime alertness. Has been mostly indoors and not badly affected by Spring pollens so far.  Review of Systems-see HPI Constitutional:   +  weight loss-diet, night sweats,  fevers, chills, fatigue, lassitude. + insomnia HEENT:   +headaches, difficulty swallowing, tooth/dental problems, +sore throat,       No- sneezing, itching, no-ears itch,+nasal congestion, post nasal drip,  CV:  No-   chest pain, orthopnea, PND, swelling in lower extremities, anasarca, dizziness,  palpitations Resp: No-   shortness of breath with exertion or at rest.           productive cough, non-productive cough,  No-  coughing up of blood.              No-   change in color of mucus.  No- wheezing.   Skin: No-   rash or lesions. GI:  No-   heartburn, indigestion, abdominal pain, nausea, vomiting,  GU: MS:  No-   joint pain or swelling.   Neuro- :  Psych:  No- change in mood or affect. Some- depression or anxiety.  No memory loss.   Physical Exam  General- Alert, Oriented, Affect-appropriate, very pleasant,   Skin- rash-none, lesions- none, excoriation- none Lymphadenopathy- none Head- atraumatic            Eyes- Gross vision intact, PERRLA, conjunctivae clear secretions            Ears- Hearing, canals normal            Nose- clear , No- sores,  no -Septal dev, mucus, polyps, erosion, perforation.             Throat- Mallampati III , mucosa clear-not read , drainage- none, tonsils- atrophic , + hoarse  Neck- flexible , trachea midline, no stridor , thyroid + incision scar barely visible, carotid no bruit Chest - symmetrical excursion , unlabored           Heart/CV- RRR , no murmur , no gallop  , no rub, nl s1 s2                           - JVD- none , edema- none, stasis changes- none, varices- none           Lung- clear, wheeze- none, cough-none, dullness-none, rub- none           Chest wall-  Abd- Br/ Gen/ Rectal- Not done, not indicated Extrem- cyanosis- none, clubbing, none, atrophy- none, strength- nl Neuro- grossly intact to observation

## 2015-09-06 NOTE — Patient Instructions (Signed)
Try an otc caffeine tab like NoDoz  It will be ok to try a sleep med at bedtime if you decide to try.

## 2015-09-08 DIAGNOSIS — L438 Other lichen planus: Secondary | ICD-10-CM | POA: Diagnosis not present

## 2015-10-04 ENCOUNTER — Encounter: Payer: Self-pay | Admitting: Internal Medicine

## 2015-10-05 ENCOUNTER — Other Ambulatory Visit: Payer: Self-pay | Admitting: Internal Medicine

## 2015-10-05 DIAGNOSIS — C73 Malignant neoplasm of thyroid gland: Secondary | ICD-10-CM

## 2015-10-07 DIAGNOSIS — C73 Malignant neoplasm of thyroid gland: Secondary | ICD-10-CM | POA: Diagnosis not present

## 2015-10-07 DIAGNOSIS — E7800 Pure hypercholesterolemia, unspecified: Secondary | ICD-10-CM | POA: Diagnosis not present

## 2015-10-17 ENCOUNTER — Encounter (HOSPITAL_COMMUNITY)
Admission: RE | Admit: 2015-10-17 | Discharge: 2015-10-17 | Disposition: A | Discharge status: Home / Self Care | Payer: Medicare HMO | Source: Ambulatory Visit | Attending: Internal Medicine | Admitting: Internal Medicine

## 2015-10-17 ENCOUNTER — Encounter: Payer: Self-pay | Admitting: Internal Medicine

## 2015-10-17 DIAGNOSIS — C73 Malignant neoplasm of thyroid gland: Secondary | ICD-10-CM | POA: Diagnosis not present

## 2015-10-17 MED ORDER — THYROTROPIN ALFA 1.1 MG IM SOLR
INTRAMUSCULAR | Status: AC
  Filled 2015-10-17: qty 0.9

## 2015-10-17 MED ORDER — THYROTROPIN ALFA 1.1 MG IM SOLR
0.9000 mg | INTRAMUSCULAR | Status: AC
  Administered 2015-10-17: 0.9 mg via INTRAMUSCULAR

## 2015-10-18 ENCOUNTER — Encounter (HOSPITAL_COMMUNITY)
Admission: RE | Admit: 2015-10-18 | Discharge: 2015-10-18 | Disposition: A | Discharge status: Home / Self Care | Payer: Medicare HMO | Source: Ambulatory Visit | Attending: Internal Medicine | Admitting: Internal Medicine

## 2015-10-18 DIAGNOSIS — C73 Malignant neoplasm of thyroid gland: Secondary | ICD-10-CM | POA: Diagnosis not present

## 2015-10-18 MED ORDER — THYROTROPIN ALFA 1.1 MG IM SOLR
0.9000 mg | INTRAMUSCULAR | Status: AC
  Administered 2015-10-18: 0.9 mg via INTRAMUSCULAR

## 2015-10-18 MED ORDER — THYROTROPIN ALFA 1.1 MG IM SOLR
INTRAMUSCULAR | Status: AC
  Filled 2015-10-18: qty 0.9

## 2015-10-19 ENCOUNTER — Encounter (HOSPITAL_COMMUNITY): Payer: Medicare HMO

## 2015-10-19 ENCOUNTER — Encounter (HOSPITAL_COMMUNITY)
Admission: RE | Admit: 2015-10-19 | Discharge: 2015-10-19 | Disposition: A | Discharge status: Home / Self Care | Payer: Medicare HMO | Source: Ambulatory Visit | Attending: Internal Medicine | Admitting: Internal Medicine

## 2015-10-19 ENCOUNTER — Ambulatory Visit: Payer: Medicare HMO | Admitting: Internal Medicine

## 2015-10-19 DIAGNOSIS — C73 Malignant neoplasm of thyroid gland: Secondary | ICD-10-CM | POA: Diagnosis not present

## 2015-10-19 MED ORDER — SODIUM IODIDE I 131 CAPSULE
123.1000 | Freq: Once | INTRAVENOUS | Status: AC | PRN
  Administered 2015-10-19: 123.1 via ORAL

## 2015-10-28 ENCOUNTER — Encounter (HOSPITAL_COMMUNITY)
Admission: RE | Admit: 2015-10-28 | Discharge: 2015-10-28 | Disposition: A | Discharge status: Home / Self Care | Payer: Medicare HMO | Source: Ambulatory Visit | Attending: Internal Medicine | Admitting: Internal Medicine

## 2015-10-28 DIAGNOSIS — E890 Postprocedural hypothyroidism: Secondary | ICD-10-CM | POA: Diagnosis present

## 2015-10-28 DIAGNOSIS — C73 Malignant neoplasm of thyroid gland: Secondary | ICD-10-CM | POA: Diagnosis present

## 2015-11-07 ENCOUNTER — Ambulatory Visit (INDEPENDENT_AMBULATORY_CARE_PROVIDER_SITE_OTHER): Payer: Medicare HMO | Admitting: Internal Medicine

## 2015-11-07 ENCOUNTER — Encounter: Payer: Self-pay | Admitting: Internal Medicine

## 2015-11-07 VITALS — BP 120/84 | HR 72 | Ht 62.5 in | Wt 167.0 lb

## 2015-11-07 DIAGNOSIS — E890 Postprocedural hypothyroidism: Secondary | ICD-10-CM | POA: Diagnosis not present

## 2015-11-07 DIAGNOSIS — C73 Malignant neoplasm of thyroid gland: Secondary | ICD-10-CM | POA: Diagnosis not present

## 2015-11-07 LAB — T4, FREE: FREE T4: 1.18 ng/dL (ref 0.60–1.60)

## 2015-11-07 LAB — CALCIUM: CALCIUM: 8.8 mg/dL (ref 8.4–10.5)

## 2015-11-07 LAB — TSH: TSH: 0.64 u[IU]/mL (ref 0.35–4.50)

## 2015-11-07 NOTE — Patient Instructions (Signed)
Please stop at the lab.  Please continue Synthroid 88 mcg daily.  Take the thyroid hormone every day, with water, at least 30 minutes before breakfast, separated by at least 4 hours from: - acid reflux medications - calcium - iron - multivitamins  Please come back for a follow-up appointment in 6 months. 

## 2015-11-07 NOTE — Progress Notes (Signed)
Patient ID: Teresa Molina, female   DOB: 11/26/1945, 70 y.o.   MRN: [id number]   HPI  Teresa Molina is a 70 y.o.-year-old female, referred by Dr. Audie Box, for evaluation for a left thyroid nodule. Last visit 3 mo ago. PCP: Dr. Wylene Simmer.  Reviewed history: Patient has been found to have an incidental thyroid nodule during a recent carotid ultrasound, on 03/10/2015. The nodule was seen in the left lobe and measured 0.9 x 0.8 x 0.9 centimeters.  At last visit, we checked a thyroid U/S (05/23/2015): Left lower pole solid hypoechoic nodule measures 10 x 8 x 8 mm, nonspecific. There appears to be minor associated microcalcification.    L nodule was small, but hypoechoic and with possible small calcifications >> I suggested FNA.  Adequacy Reason Satisfactory For Evaluation. Diagnosis THYROID, LEFT LOBE INFERIOR, FINE NEEDLE ASPIRATION (SPECIMEN 1 OF 1, COLLECTED ON 06/07/2015): POSITIVE FOR PAPILLARY THYROID CARCINOMA ([place] CATEGORY VI). Zandra Abts MD Pathologist, Electronic Signature (Case signed 06/08/2015) Specimen Clinical Information Left lower pole solid hypoechoic nodule measures 10 x 8 x 8 mm, nonspecific, There appears to be minor associated microcalcification Source Thyroid, Fine Needle Aspiration, Left Lobe Inferior, (Specimen 1 of 1, collected on 06/07/15 )  Patient had total thyroidectomy by Dr. Gerrit Friends on 07/21/2015. Final pathology showed: Diagnosis Thyroid, thyroidectomy, total - PAPILLARY CARCINOMA, CLASSIC VARIANT, SPANNING 1.1 CM. - EXTRATHYROIDAL EXTENSION PRESENT. - RESECTION MARGINS ARE NEGATIVE. - NO LYMPHOVASCULAR INVASION. - PARATHYROID TISSUE PRESENT. - SEE ONCOLOGY TABLE. Microscopic Comment THYROID Specimen: Total thyroid. Procedure (including lymph node sampling if applicable): Total thyroidectomy. Specimen Integrity (intact/fragmented): Intact. Tumor focality: Unifocal. Dominant tumor: Maximum tumor size (cm): 1.1 cm. Tumor laterality:  Left. Histologic type (including subtype and/or unique features as applicable): Papillary carcinoma, classic variant. Tumor capsule: Partial. Extrathyroidal extension: Present. Capsular invasion with degree of invasion if present: N/A. Margins: Negative. Lymphatic or vascular invasion: Not identified. Lymph nodes: # examined 0; # positive; 0 TNM code: pT3, pNX Non-neoplastic thyroid: Nodular hyperplasia, benign parathyroid tissue.  10/19/2015: RAI tx 123 mCi 10/28/2015: post-tx WBS: no mets  She is now on Synthroid 88 mcg: - in am - with water - waits >30 min after this to eat b'fast - Aciphex usually late in the afternoon  I reviewed pt's thyroid tests: Lab Results  Component Value Date   TSH 0.98 08/29/2015   FREET4 1.28 08/29/2015  03/07/2015: TSH 3.22, fT4 0.99   She had a Calcium of 7.4 postop >> started CaCO3.  After the RAI >> metallic taste in mouth and sore throat.  Pt mentions: - + fatigue - no weight gain - + Hot flashes  - no fatigue - No  tremors -  no palpitations -  No anxiety/depression -  no hyperdefecation/constipation  No FH of thyroid ds. No FH of thyroid cancer. No h/o radiation tx to head or neck.  No seaweed or kelp. No recent contrast studies. No steroid use. No herbal supplements. No Biotin supplements or Hair, Skin and Nails vitamins.  She has a history of HTN, HL, GERD, and sleep apnea.  ROS: Constitutional:See history of present illness  Eyes: no blurry vision, no xerophthalmia ENT: no sore throat, no nodules palpated in throat, no dysphagia/odynophagia, no hoarseness Cardiovascular: no CP/SOB/palpitations/leg swelling Respiratory: no cough/SOB Gastrointestinal: no N/V/D/C Musculoskeletal: no muscle/joint aches Skin: no rashes Neurological: no tremors/numbness/tingling/dizziness  I reviewed pt's medications, allergies, PMH, social hx, family hx, and changes were documented in the history of present illness. Otherwise, unchanged  from my  initial visit note.  Past Medical History  Diagnosis Date  . ALLERGIC RHINITIS   . Hypertension   . Insomnia   . Allergic conjunctivitis   . GERD (gastroesophageal reflux disease)   . Esophageal reflux   . Food allergy     angioedema > strawberries  . Breast cyst     left breast - ultrasound benign 2010  . PONV (postoperative nausea and vomiting)   . OSA on CPAP     NPSG 08-03-09: AHI 17.6; CPAP 12/ AHI 0; PLMA- mild does not wear cpap    Past Surgical History  Procedure Laterality Date  . Tonsillectomy  1952 - approximate  . Rotator cuff repair  2006  . Total abdominal hysterectomy  1978  . Cholecystectomy  1991  . Breast surgery  2013    Rt breast mass excision  . Dilation and curettage of uterus    . Thyroidectomy N/A 07/21/2015    Procedure: TOTAL THYROIDECTOMY;  Surgeon: Armandina Gemma, MD;  Location: [facility];  Service: General;  Laterality: N/A;   Social History   Social History  . Marital Status: Divorced    Spouse Name: N/A  . Number of Children: 2   Occupational History  . 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 Smoking status: Never Smoker   . Smokeless tobacco: Never Used  . Alcohol Use: 0.0 oz/week    0 Standard drinks or equivalent per week     Comment: RARELY  . Drug Use: No   Current Outpatient Prescriptions on File Prior to Visit  Medication Sig Dispense Refill  . aspirin 81 MG tablet Take 81 mg by mouth daily.     Marland Kitchen ibuprofen (ADVIL,MOTRIN) 200 MG tablet Take 200 mg by mouth as needed for moderate pain. Reported on 05/11/2015    . labetalol (NORMODYNE) 200 MG tablet Take 1 tablet (200 mg total) by mouth 2 (two) times daily. 60 tablet 0  . SYNTHROID 88 MCG tablet Take 1 tablet (88 mcg total) by mouth daily before breakfast. 30 tablet 3  . triamterene-hydrochlorothiazide (MAXZIDE-25) 37.5-25 MG per tablet Take 1 tablet by mouth daily. 30 tablet 0  .  calcium carbonate (OS-CAL - DOSED IN MG OF ELEMENTAL CALCIUM) 1250 (500 Ca) MG tablet Take 2 tablets (1,000 mg of elemental calcium total) by mouth 3 (three) times daily with meals. (Patient not taking: Reported on 11/07/2015) 90 tablet 1  . clonazePAM (KLONOPIN) 1 MG tablet 1/2 or 1 at bedtime as needed for sleep (Patient not taking: Reported on 11/07/2015) 30 tablet 5  . HYDROcodone-acetaminophen (NORCO/VICODIN) 5-325 MG tablet Take 1-2 tablets by mouth every 4 (four) hours as needed for moderate pain. (Patient not taking: Reported on 11/07/2015) 20 tablet 0  . RABEprazole (ACIPHEX) 20 MG tablet Take 20 mg by mouth daily. Reported on 11/07/2015     No current facility-administered medications on file prior to visit.   Allergies  Allergen Reactions  . Amlodipine Swelling  . Diovan [Valsartan] Swelling  . Lipitor [Atorvastatin Calcium] Other (See Comments)    Caused muscle paralysis in legs  . Codeine Nausea And Vomiting  . Compazine [Prochlorperazine Edisylate] Other (See Comments)    Facial  muscle spasms   . Reglan [Metoclopramide] Other (See Comments)    Facial muscle spasms  . Benadryl [Diphenhydramine Hcl] Palpitations  . Penicillins Other (See Comments)    Yeast infections  Family History  Problem Relation Age of Onset  . Other Father     brain tumor  . COPD Mother    PE: BP 120/84 mmHg  Pulse 72  Ht 5' 2.5" (1.588 m)  Wt 167 lb (75.751 kg)  BMI 30.04 kg/m2  SpO2 97% Body mass index is 30.04 kg/(m^2). Wt Readings from Last 3 Encounters:  11/07/15 167 lb (75.751 kg)  09/06/15 166 lb 3.2 oz (75.388 kg)  08/08/15 165 lb (74.844 kg)   Constitutional: overweight, in NAD Eyes: PERRLA, EOMI, no exophthalmos ENT: moist mucous membranes, Thyroid scar healing, no cervical lymphadenopathy;  Cardiovascular: RRR, No MRG Respiratory: CTA B Gastrointestinal: abdomen soft, NT, ND, BS+ Musculoskeletal: no deformities, strength intact in all 4;  Skin: moist, warm, no  rashes Neurological: no tremor with outstretched hands, DTR normal in all 4  ASSESSMENT: 1. Papillary thyroid cancer  2. Postsurgical hypothyroidism  3. Postoperative hypocalcemia  PLAN: 1. PTC - Reviewed the surgical pathology: classical PTC, which is small, 1.1, and unilateral. There was no lymphovascular invasion, however, there has been extrathyroidal extension. Because of this, her tumor is considered intermediate risk, rather than low risk. >> I suggested radioactive iodine treatment with Thyrogen, which she had this mo (with a rather hefty dose of RAI...). A WBS showed no mets. - We  again discussed about thyroid cancer in general, the fact that it is usually a benign, indolent cancer, which most likely will not affect her life expectancy, however, if he requires monitoring.  - will check Tg + ATA - advised her to use lemon drops to improve dry mouth + metallic taste after RAI  2. Postsurgical hypothyroidism - We discussed about her levothyroxine supplementation. She is currently are Synthroid 88 g daily. - We discussed about the need to take the thyroid hormone every day, with water, at least 30 minutes before breakfast, separated by at least 4 hours from: - acid reflux medications - calcium - iron - multivitamins She is taking it correctly. - will check TFTs today  3. Postop hypocalcemia - will check a calcium level  Office Visit on 11/07/2015  Component Date Value Ref Range Status  . TSH 11/07/2015 0.64  0.35 - 4.50 uIU/mL Final  . Free T4 11/07/2015 1.18  0.60 - 1.60 ng/dL Final  . Thyroglobulin 11/07/2015 0.3* 2.8 - 40.9 ng/mL Final   Comment: Thyroglobulin antibodies (TGAb) interfere with Thyroglobulin (TG) assays; therefore, Thyroglobulin antibody (TGAb) assay should always be performed in conjunction with a Thyroglobulin (TG) assay.   This test was performed using the Beckman Coulter chemiluminescent method.  Values obtained from different assay methods cannot  be used interchangeably.  Thyroglobulin levels, regardless of value, should not be interpreted as absolute evidence of the presence or absence of disease.   . Thyroglobulin Ab 11/07/2015 <1  <2 IU/mL Final  . Calcium 11/07/2015 8.8  8.4 - 10.5 mg/dL Final   Normal calcium and TFTs. Thyroglobulin is very low and the thyroglobulin antibodies are undetectable. Will continue to follow the Tg.

## 2015-11-08 LAB — THYROGLOBULIN LEVEL: THYROGLOBULIN: 0.3 ng/mL — AB (ref 2.8–40.9)

## 2015-11-08 LAB — THYROGLOBULIN ANTIBODY

## 2015-11-17 ENCOUNTER — Other Ambulatory Visit: Payer: Self-pay

## 2015-11-17 MED ORDER — SYNTHROID 88 MCG PO TABS: 88.0000 ug | ORAL_TABLET | Freq: Every day | ORAL | Status: DC

## 2015-11-17 NOTE — Telephone Encounter (Signed)
Synthroid refill sent into patients pharmacy.

## 2015-11-22 DIAGNOSIS — E7800 Pure hypercholesterolemia, unspecified: Secondary | ICD-10-CM | POA: Diagnosis not present

## 2016-01-02 DIAGNOSIS — M25562 Pain in left knee: Secondary | ICD-10-CM | POA: Diagnosis not present

## 2016-01-02 DIAGNOSIS — G720 Drug-induced myopathy: Secondary | ICD-10-CM | POA: Diagnosis not present

## 2016-01-02 DIAGNOSIS — T466X1A Poisoning by antihyperlipidemic and antiarteriosclerotic drugs, accidental (unintentional), initial encounter: Secondary | ICD-10-CM | POA: Diagnosis not present

## 2016-01-02 DIAGNOSIS — C73 Malignant neoplasm of thyroid gland: Secondary | ICD-10-CM | POA: Diagnosis not present

## 2016-01-02 DIAGNOSIS — M25561 Pain in right knee: Secondary | ICD-10-CM | POA: Diagnosis not present

## 2016-01-11 DIAGNOSIS — L720 Epidermal cyst: Secondary | ICD-10-CM | POA: Diagnosis not present

## 2016-01-11 DIAGNOSIS — D2272 Melanocytic nevi of left lower limb, including hip: Secondary | ICD-10-CM | POA: Diagnosis not present

## 2016-01-11 DIAGNOSIS — L814 Other melanin hyperpigmentation: Secondary | ICD-10-CM | POA: Diagnosis not present

## 2016-01-11 DIAGNOSIS — D2261 Melanocytic nevi of right upper limb, including shoulder: Secondary | ICD-10-CM | POA: Diagnosis not present

## 2016-01-11 DIAGNOSIS — D225 Melanocytic nevi of trunk: Secondary | ICD-10-CM | POA: Diagnosis not present

## 2016-01-11 DIAGNOSIS — D2271 Melanocytic nevi of right lower limb, including hip: Secondary | ICD-10-CM | POA: Diagnosis not present

## 2016-01-11 DIAGNOSIS — D045 Carcinoma in situ of skin of trunk: Secondary | ICD-10-CM | POA: Diagnosis not present

## 2016-01-11 DIAGNOSIS — D485 Neoplasm of uncertain behavior of skin: Secondary | ICD-10-CM | POA: Diagnosis not present

## 2016-01-11 DIAGNOSIS — D2239 Melanocytic nevi of other parts of face: Secondary | ICD-10-CM | POA: Diagnosis not present

## 2016-01-11 DIAGNOSIS — D1801 Hemangioma of skin and subcutaneous tissue: Secondary | ICD-10-CM | POA: Diagnosis not present

## 2016-01-19 ENCOUNTER — Ambulatory Visit (INDEPENDENT_AMBULATORY_CARE_PROVIDER_SITE_OTHER): Payer: Medicare HMO | Admitting: Internal Medicine

## 2016-01-19 ENCOUNTER — Encounter: Payer: Self-pay | Admitting: Internal Medicine

## 2016-01-19 VITALS — BP 124/78 | HR 61 | Wt 166.0 lb

## 2016-01-19 DIAGNOSIS — C73 Malignant neoplasm of thyroid gland: Secondary | ICD-10-CM | POA: Diagnosis not present

## 2016-01-19 DIAGNOSIS — E890 Postprocedural hypothyroidism: Secondary | ICD-10-CM

## 2016-01-19 MED ORDER — SYNTHROID 75 MCG PO TABS: 75.0000 ug | ORAL_TABLET | Freq: Every day | ORAL | 2 refills | Status: DC

## 2016-01-19 NOTE — Progress Notes (Signed)
Patient ID: Teresa Molina, female   DOB: [birth year], 70 y.o.   MRN: [id number]   HPI  Teresa Molina is a 70 y.o.-year-old female, referred by Dr. Phineas Real, for evaluation for a left thyroid nodule. Last visit 3 mo ago. PCP: Dr. Shelia Media.  Since last visit, she saw PCP >>  was on Vytorin >> changed to Crestor >> mm/joint aches >> stopped. Blood work (01/02/2016): TSH 0.22. She started Zoloft 2 weeks ago.   Reviewed history: Patient has been found to have an incidental thyroid nodule during a recent carotid ultrasound, on 03/10/2015. The nodule was seen in the left lobe and measured 0.9 x 0.8 x 0.9 centimeters.  At last visit, we checked a thyroid U/S (05/23/2015): Left lower pole solid hypoechoic nodule measures 10 x 8 x 8 mm, nonspecific. There appears to be minor associated microcalcification.    L nodule was small, but hypoechoic and with possible small calcifications >> I suggested FNA.  Adequacy Reason Satisfactory For Evaluation. Diagnosis THYROID, LEFT LOBE INFERIOR, FINE NEEDLE ASPIRATION (SPECIMEN 1 OF 1, COLLECTED ON 06/07/2015): POSITIVE FOR PAPILLARY THYROID CARCINOMA (Teresa Molina). Teresa Niece MD Pathologist, Electronic Signature (Case signed 06/08/2015) Specimen Clinical Information Left lower pole solid hypoechoic nodule measures 10 x 8 x 8 mm, nonspecific, There appears to be minor associated microcalcification Source Thyroid, Fine Needle Aspiration, Left Lobe Inferior, (Specimen 1 of 1, collected on 06/07/15 )  Patient had total thyroidectomy by Dr. Harlow Asa on 07/21/2015. Final pathology showed: Diagnosis Thyroid, thyroidectomy, total - PAPILLARY CARCINOMA, CLASSIC VARIANT, SPANNING 1.1 CM. - EXTRATHYROIDAL EXTENSION PRESENT. - RESECTION MARGINS ARE NEGATIVE. - NO LYMPHOVASCULAR INVASION. - PARATHYROID TISSUE PRESENT. - SEE ONCOLOGY TABLE. Microscopic Comment THYROID Specimen: Total thyroid. Procedure (including lymph node sampling if applicable):  Total thyroidectomy. Specimen Integrity (intact/fragmented): Intact. Tumor focality: Unifocal. Dominant tumor: Maximum tumor size (cm): 1.1 cm. Tumor laterality: Left. Histologic type (including subtype and/or unique features as applicable): Papillary carcinoma, classic variant. Tumor capsule: Partial. Extrathyroidal extension: Present. Capsular invasion with degree of invasion if present: N/A. Margins: Negative. Lymphatic or vascular invasion: Not identified. Lymph nodes: # examined 0; # positive; 0 TNM code: pT3, pNX Non-neoplastic thyroid: Nodular hyperplasia, benign parathyroid tissue.  10/19/2015: RAI tx 123 mCi 10/28/2015: post-tx WBS: no mets  She is now on Synthroid 88 mcg: - in am - with water - waits >30 min after this to eat b'fast - was taking Aciphex late in the afternoon >> stopped   I reviewed pt's thyroid tests: Lab Results  Component Value Date   TSH 0.64 11/07/2015   TSH 0.98 08/29/2015   FREET4 1.18 11/07/2015   FREET4 1.28 08/29/2015  03/07/2015: TSH 3.22, fT4 0.99   She had a Calcium of 7.4 postop >> started CaCO3. Calcium normalized.  Pt mentions: - + fatigue - no weight gain - no hot flashes  - no fatigue - no tremors -  no palpitations -  No anxiety/depression -  no hyperdefecation/constipation  No FH of thyroid ds. No FH of thyroid cancer. No h/o radiation tx to head or neck.  No seaweed or kelp. No recent contrast studies. No steroid use. No herbal supplements. No Biotin supplements or Hair, Skin and Nails vitamins.  She has a history of HTN, HL, GERD, and sleep apnea.  ROS: Constitutional:See history of present illness  Eyes: no blurry vision, no xerophthalmia ENT: no sore throat, no nodules palpated in throat, no dysphagia/odynophagia, no hoarseness Cardiovascular: no CP/SOB/palpitations/leg swelling Respiratory: no cough/SOB Gastrointestinal: no N/V/D/C  Musculoskeletal: no muscle/joint aches Skin: no rashes Neurological: no  tremors/numbness/tingling/dizziness  I reviewed pt's medications, allergies, PMH, social hx, family hx, and changes were documented in the history of present illness. Otherwise, unchanged from my initial visit note.  Past Medical History:  Diagnosis Date  . Allergic conjunctivitis   . ALLERGIC RHINITIS   . Breast cyst    left breast - ultrasound benign 2010  . Esophageal reflux   . Food allergy    angioedema > strawberries  . GERD (gastroesophageal reflux disease)   . Hypertension   . Insomnia   . OSA on CPAP    NPSG 08-03-09: AHI 17.6; CPAP 12/ AHI 0; PLMA- mild does not wear cpap   . PONV (postoperative nausea and vomiting)    Past Surgical History:  Procedure Laterality Date  . BREAST SURGERY  2013   Rt breast mass excision  . CHOLECYSTECTOMY  1991  . DILATION AND CURETTAGE OF UTERUS    . ROTATOR CUFF REPAIR  2006  . THYROIDECTOMY N/A 07/21/2015   Procedure: TOTAL THYROIDECTOMY;  Surgeon: Armandina Gemma, MD;  Location: [facility];  Service: General;  Laterality: N/A;  . TONSILLECTOMY  1952 - approximate  . TOTAL ABDOMINAL HYSTERECTOMY  1978   Social History   Social History  . Marital Status: Divorced    Spouse Name: N/A  . Number of Children: 2   Occupational History  . 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 Smoking status: Never Smoker   . Smokeless tobacco: Never Used  . Alcohol Use: 0.0 oz/week    0 Standard drinks or equivalent per week     Comment: RARELY  . Drug Use: No   Current Outpatient Prescriptions on File Prior to Visit  Medication Sig Dispense Refill  . aspirin 81 MG tablet Take 81 mg by mouth daily.     Marland Kitchen ibuprofen (ADVIL,MOTRIN) 200 MG tablet Take 200 mg by mouth as needed for moderate pain. Reported on 05/11/2015    . labetalol (NORMODYNE) 200 MG tablet Take 1 tablet (200 mg total) by mouth 2 (two) times daily. 60 tablet 0  . SYNTHROID 88 MCG tablet Take 1  tablet (88 mcg total) by mouth daily before breakfast. 30 tablet 3  . triamterene-hydrochlorothiazide (MAXZIDE-25) 37.5-25 MG per tablet Take 1 tablet by mouth daily. 30 tablet 0  . calcium carbonate (OS-CAL - DOSED IN MG OF ELEMENTAL CALCIUM) 1250 (500 Ca) MG tablet Take 2 tablets (1,000 mg of elemental calcium total) by mouth 3 (three) times daily with meals. (Patient not taking: Reported on 11/07/2015) 90 tablet 1  . clonazePAM (KLONOPIN) 1 MG tablet 1/2 or 1 at bedtime as needed for sleep (Patient not taking: Reported on 11/07/2015) 30 tablet 5  . HYDROcodone-acetaminophen (NORCO/VICODIN) 5-325 MG tablet Take 1-2 tablets by mouth every 4 (four) hours as needed for moderate pain. (Patient not taking: Reported on 11/07/2015) 20 tablet 0  . RABEprazole (ACIPHEX) 20 MG tablet Take 20 mg by mouth daily. Reported on 11/07/2015    . rosuvastatin (CRESTOR) 10 MG tablet      No current facility-administered medications on file prior to visit.    Allergies  Allergen Reactions  . Amlodipine Swelling  . Diovan [Valsartan] Swelling  . Lipitor [Atorvastatin Calcium] Other (See Comments)    Caused muscle paralysis in legs  . Codeine Nausea And Vomiting  . Compazine [Prochlorperazine Edisylate]  Other (See Comments)    Facial  muscle spasms   . Crestor [Rosuvastatin] Other (See Comments)    Severe joint pain.  . Reglan [Metoclopramide] Other (See Comments)    Facial muscle spasms  . Benadryl [Diphenhydramine Hcl] Palpitations  . Penicillins Other (See Comments)    Yeast infections   Family History  Problem Relation Age of Onset  . Other Father     brain tumor  . COPD Mother    PE: BP 124/78 (BP Location: Left Arm, Patient Position: Sitting)   Pulse 61   Wt 166 lb (75.3 kg)   SpO2 97%   BMI 29.88 kg/m  Body mass index is 29.88 kg/m. Wt Readings from Last 3 Encounters:  01/19/16 166 lb (75.3 kg)  11/07/15 167 lb (75.8 kg)  09/06/15 166 lb 3.2 oz (75.4 kg)   Constitutional: overweight, in  NAD Eyes: PERRLA, EOMI, no exophthalmos ENT: moist mucous membranes, Thyroid scar healing, no cervical lymphadenopathy;  Cardiovascular: RRR, No MRG Respiratory: CTA B Gastrointestinal: abdomen soft, NT, ND, BS+ Musculoskeletal: no deformities, strength intact in all 4;  Skin: moist, warm, no rashes Neurological: no tremor with outstretched hands, DTR normal in all 4  ASSESSMENT: 1. Papillary thyroid cancer  2. Postsurgical hypothyroidism  3. Postoperative hypocalcemia  PLAN: 1. PTC - Reviewed the surgical pathology: classical PTC, which is small, 1.1, and unilateral. There was no lymphovascular invasion, however, there has been extrathyroidal extension. Because of this, her tumor is considered intermediate risk, rather than low risk. >> I suggested radioactive iodine treatment with Thyrogen >> she had this (with a rather hefty dose of RAI...). A WBS showed no mets. - We  again discussed about thyroid cancer in general, the fact that it is usually a benign, indolent cancer, which most likely will not affect her life expectancy, however, if he requires monitoring.  - reviewed last Tg + ATA >> Tg low, ATA undetectable  2. Postsurgical hypothyroidism - We discussed about her levothyroxine supplementation. She is currently on Synthroid 88 g daily. - We discussed about the need to take the thyroid hormone every day, with water, at least 30 minutes before breakfast, separated by at least 4 hours from: - acid reflux medications - calcium - iron - multivitamins She is taking it correctly. - reviewed latest TFTs >> normal 2.5 mo ago, however, recent TSH by PCP was slightly low >> will decrease LT4 to 75 mcg daily >> have her back for labs in 5 weeks.  3. Postop hypocalcemia - latest calcium level normal  Philemon Kingdom, MD PhD [facility] Endocrinology

## 2016-01-19 NOTE — Patient Instructions (Signed)
Please decrease the Synthroid 75 mcg daily.  Take the thyroid hormone every day, with water, at least 30 minutes before breakfast, separated by at least 4 hours from: - acid reflux medications - calcium - iron - multivitamins  Please come back for labs in 5 weeks.  Please return in 3 months.

## 2016-01-31 DIAGNOSIS — R69 Illness, unspecified: Secondary | ICD-10-CM | POA: Diagnosis not present

## 2016-01-31 DIAGNOSIS — Z23 Encounter for immunization: Secondary | ICD-10-CM | POA: Diagnosis not present

## 2016-01-31 DIAGNOSIS — E039 Hypothyroidism, unspecified: Secondary | ICD-10-CM | POA: Diagnosis not present

## 2016-02-08 DIAGNOSIS — L309 Dermatitis, unspecified: Secondary | ICD-10-CM | POA: Diagnosis not present

## 2016-02-08 DIAGNOSIS — L720 Epidermal cyst: Secondary | ICD-10-CM | POA: Diagnosis not present

## 2016-02-08 DIAGNOSIS — Z85828 Personal history of other malignant neoplasm of skin: Secondary | ICD-10-CM | POA: Diagnosis not present

## 2016-02-23 ENCOUNTER — Other Ambulatory Visit (INDEPENDENT_AMBULATORY_CARE_PROVIDER_SITE_OTHER): Payer: Medicare HMO

## 2016-02-23 DIAGNOSIS — E890 Postprocedural hypothyroidism: Secondary | ICD-10-CM | POA: Diagnosis not present

## 2016-02-23 LAB — TSH: TSH: 0.74 u[IU]/mL (ref 0.35–4.50)

## 2016-02-23 LAB — T4, FREE: Free T4: 0.8 ng/dL (ref 0.60–1.60)

## 2016-03-22 DIAGNOSIS — E118 Type 2 diabetes mellitus with unspecified complications: Secondary | ICD-10-CM | POA: Diagnosis not present

## 2016-03-22 DIAGNOSIS — Z0000 Encounter for general adult medical examination without abnormal findings: Secondary | ICD-10-CM | POA: Diagnosis not present

## 2016-03-22 DIAGNOSIS — E7800 Pure hypercholesterolemia, unspecified: Secondary | ICD-10-CM | POA: Diagnosis not present

## 2016-03-28 DIAGNOSIS — Z6830 Body mass index (BMI) 30.0-30.9, adult: Secondary | ICD-10-CM | POA: Diagnosis not present

## 2016-03-28 DIAGNOSIS — J309 Allergic rhinitis, unspecified: Secondary | ICD-10-CM | POA: Diagnosis not present

## 2016-03-28 DIAGNOSIS — Z23 Encounter for immunization: Secondary | ICD-10-CM | POA: Diagnosis not present

## 2016-03-28 DIAGNOSIS — E559 Vitamin D deficiency, unspecified: Secondary | ICD-10-CM | POA: Diagnosis not present

## 2016-03-28 DIAGNOSIS — Z0000 Encounter for general adult medical examination without abnormal findings: Secondary | ICD-10-CM | POA: Diagnosis not present

## 2016-04-11 ENCOUNTER — Encounter: Payer: Self-pay | Admitting: Gynecology

## 2016-04-11 ENCOUNTER — Ambulatory Visit (INDEPENDENT_AMBULATORY_CARE_PROVIDER_SITE_OTHER): Payer: Medicare HMO | Admitting: Gynecology

## 2016-04-11 VITALS — BP 122/78 | Ht 63.0 in | Wt 166.0 lb

## 2016-04-11 DIAGNOSIS — Z01419 Encounter for gynecological examination (general) (routine) without abnormal findings: Secondary | ICD-10-CM

## 2016-04-11 DIAGNOSIS — N952 Postmenopausal atrophic vaginitis: Secondary | ICD-10-CM

## 2016-04-11 NOTE — Progress Notes (Signed)
    TYMEKA AMER [birth year] [id number]        70 y.o.  G2P2002  for breast and pelvic exam.  Past medical history,surgical history, problem list, medications, allergies, family history and social history were all reviewed and documented as reviewed in the EPIC chart.  ROS:  Performed with pertinent positives and negatives included in the history, assessment and plan.   Additional significant findings :  None   Exam: Caryn Bee assistant Vitals:   04/11/16 1112  BP: 122/78  Weight: 166 lb (75.3 kg)  Height: 5\' 3"  (1.6 m)   Body mass index is 29.41 kg/m.  General appearance:  Normal affect, orientation and appearance. Skin: Grossly normal HEENT: Without gross lesions.  No cervical or supraclavicular adenopathy. Thyroid normal.  Lungs:  Clear without wheezing, rales or rhonchi Cardiac: RR, without RMG Abdominal:  Soft, nontender, without masses, guarding, rebound, organomegaly or hernia Breasts:  Examined lying and sitting without masses, retractions, discharge or axillary adenopathy. Pelvic:  Ext, BUS, Vagina with atrophic changes  Adnexa without masses or tenderness    Anus and perineum normal   Rectovaginal normal sphincter tone without palpated masses or tenderness.    Assessment/Plan:  70 y.o. G34P2002 female for breast and pelvic exam..   1. Postmenopausal/atrophic genital changes. Doing well without significant hot flushes, night sweats or vaginal dryness. Status post hysterectomy in the past for benign indications. Continue monitor report any issues. 2. Pap smear 2015. No Pap smear done today. No history of abnormal Pap smears. Status post hysterectomy for benign indications. Options to stop screening per current screening guidelines reviewed. 3. Mammography scheduled next month. SBE monthly reviewed. 4. DEXA 2013 normal. Will plan repeat at age 59.  Increased calcium vitamin D reviewed. 5. Colonoscopy 2014. Repeat at their recommended interval. 6. Health maintenance.  No routine lab work done as this is done elsewhere. Had thyroid carcinoma removed this past year and is actively being followed by Dr. Cruzita Lederer. Follow up in one year, sooner as needed   [name] MD, 11:44 AM 04/11/2016

## 2016-04-11 NOTE — Patient Instructions (Signed)

## 2016-04-12 DIAGNOSIS — R69 Illness, unspecified: Secondary | ICD-10-CM | POA: Diagnosis not present

## 2016-04-12 DIAGNOSIS — E559 Vitamin D deficiency, unspecified: Secondary | ICD-10-CM | POA: Diagnosis not present

## 2016-04-12 DIAGNOSIS — E890 Postprocedural hypothyroidism: Secondary | ICD-10-CM | POA: Diagnosis not present

## 2016-04-12 DIAGNOSIS — I10 Essential (primary) hypertension: Secondary | ICD-10-CM | POA: Diagnosis not present

## 2016-04-23 DIAGNOSIS — Z1231 Encounter for screening mammogram for malignant neoplasm of breast: Secondary | ICD-10-CM | POA: Diagnosis not present

## 2016-04-24 ENCOUNTER — Other Ambulatory Visit: Payer: Self-pay | Admitting: Internal Medicine

## 2016-04-26 ENCOUNTER — Encounter: Payer: Self-pay | Admitting: Internal Medicine

## 2016-04-26 ENCOUNTER — Ambulatory Visit (INDEPENDENT_AMBULATORY_CARE_PROVIDER_SITE_OTHER): Payer: Medicare HMO | Admitting: Internal Medicine

## 2016-04-26 VITALS — BP 120/74 | HR 63 | Ht 63.0 in | Wt 168.6 lb

## 2016-04-26 DIAGNOSIS — E890 Postprocedural hypothyroidism: Secondary | ICD-10-CM | POA: Diagnosis not present

## 2016-04-26 DIAGNOSIS — C73 Malignant neoplasm of thyroid gland: Secondary | ICD-10-CM

## 2016-04-26 LAB — T4, FREE: FREE T4: 1.13 ng/dL (ref 0.60–1.60)

## 2016-04-26 LAB — TSH: TSH: 1.51 u[IU]/mL (ref 0.35–4.50)

## 2016-04-26 NOTE — Patient Instructions (Signed)
Please continue the Synthroid 75 mcg daily.  Take the thyroid hormone every day, with water, at least 30 minutes before breakfast, separated by at least 4 hours from: - acid reflux medications - calcium - iron - multivitamins  Please stop at the lab.  Please return in 6 months.

## 2016-04-26 NOTE — Progress Notes (Signed)
Patient ID: KENYATA NAPIER, female   DOB: December 31, 1945, 70 y.o.   MRN: [id number]   HPI  CHRISTIANNE ZACHER is a 70 y.o.-year-old female, referred by Dr. Phineas Real, for evaluation for a left thyroid nodule. Last visit 3 mo ago. PCP: Dr. Shelia Media.  She feels more fatigued, but no more heat intolerance after the change in LT4 dose at last visit..   She had a reaction to Zoloft >> tremors >> stopped since last visit.  Reviewed history: Patient has been found to have an incidental thyroid nodule during a recent carotid ultrasound, on 03/10/2015. The nodule was seen in the left lobe and measured 0.9 x 0.8 x 0.9 centimeters.  We checked a thyroid U/S (05/23/2015): Left lower pole solid hypoechoic nodule measures 10 x 8 x 8 mm, nonspecific. There appears to be minor associated microcalcification.    L nodule was small, but hypoechoic and with possible small calcifications >> I suggested FNA.  Adequacy Reason Satisfactory For Evaluation. Diagnosis THYROID, LEFT LOBE INFERIOR, FINE NEEDLE ASPIRATION (SPECIMEN 1 OF 1, COLLECTED ON 06/07/2015): POSITIVE FOR PAPILLARY THYROID CARCINOMA ([place] CATEGORY VI). [name] MD Pathologist, Electronic Signature (Case signed 06/08/2015) Specimen Clinical Information Left lower pole solid hypoechoic nodule measures 10 x 8 x 8 mm, nonspecific, There appears to be minor associated microcalcification Source Thyroid, Fine Needle Aspiration, Left Lobe Inferior, (Specimen 1 of 1, collected on 06/07/15 )  Patient had total thyroidectomy by Dr. Harlow Asa on 07/21/2015. Final pathology showed: Diagnosis Thyroid, thyroidectomy, total - PAPILLARY CARCINOMA, CLASSIC VARIANT, SPANNING 1.1 CM. - EXTRATHYROIDAL EXTENSION PRESENT. - RESECTION MARGINS ARE NEGATIVE. - NO LYMPHOVASCULAR INVASION. - PARATHYROID TISSUE PRESENT. - SEE ONCOLOGY TABLE. Microscopic Comment THYROID Specimen: Total thyroid. Procedure (including lymph node sampling if applicable): Total  thyroidectomy. Specimen Integrity (intact/fragmented): Intact. Tumor focality: Unifocal. Dominant tumor: Maximum tumor size (cm): 1.1 cm. Tumor laterality: Left. Histologic type (including subtype and/or unique features as applicable): Papillary carcinoma, classic variant. Tumor capsule: Partial. Extrathyroidal extension: Present. Capsular invasion with degree of invasion if present: N/A. Margins: Negative. Lymphatic or vascular invasion: Not identified. Lymph nodes: # examined 0; # positive; 0 TNM code: pT3, pNX Non-neoplastic thyroid: Nodular hyperplasia, benign parathyroid tissue.  10/19/2015: RAI tx 123 mCi 10/28/2015: post-tx WBS: no mets  She is now on Synthroid 88 >> 75 mcg (feels more tired): - in am - with water - waits >30 min after this to eat b'fast - off Aciphex   I reviewed pt's thyroid tests: Lab Results  Component Value Date   TSH 0.74 02/23/2016   TSH 0.64 11/07/2015   TSH 0.98 08/29/2015   FREET4 0.80 02/23/2016   FREET4 1.18 11/07/2015   FREET4 1.28 08/29/2015  01/02/2016: TSH 0.22. 03/07/2015: TSH 3.22, fT4 0.99   She had a Calcium of 7.4 postop >> started CaCO3. Calcium normalized.  Pt mentions: - + fatigue - no weight gain - no hot flashes  - no fatigue - no tremors -  no palpitations -  No anxiety/depression -  no hyperdefecation/constipation  No FH of thyroid ds. No FH of thyroid cancer. No h/o radiation tx to head or neck.  No seaweed or kelp. No recent contrast studies. No steroid use. No herbal supplements. No Biotin supplements or Hair, Skin and Nails vitamins.  She has a history of HTN, HL, GERD, and sleep apnea.  ROS: Constitutional:See history of present illness  Eyes: no blurry vision, no xerophthalmia ENT: no sore throat, no nodules palpated in throat, no dysphagia/odynophagia, no hoarseness Cardiovascular:  no CP/SOB/palpitations/leg swelling Respiratory: no cough/SOB Gastrointestinal: no N/V/D/C Musculoskeletal: no  muscle/joint aches Skin: no rashes Neurological: no tremors/numbness/tingling/dizziness  I reviewed pt's medications, allergies, PMH, social hx, family hx, and changes were documented in the history of present illness. Otherwise, unchanged from my initial visit note.  Past Medical History:  Diagnosis Date  . Allergic conjunctivitis   . ALLERGIC RHINITIS   . Breast cyst    left breast - ultrasound benign 2010  . Cancer ([place])    Thyroid  . Esophageal reflux   . Food allergy    angioedema > strawberries  . GERD (gastroesophageal reflux disease)   . Hypertension   . Insomnia   . OSA on CPAP    NPSG 08-03-09: AHI 17.6; CPAP 12/ AHI 0; PLMA- mild does not wear cpap   . PONV (postoperative nausea and vomiting)    Past Surgical History:  Procedure Laterality Date  . BREAST SURGERY  2013   Rt breast mass excision  . CHOLECYSTECTOMY  1991  . DILATION AND CURETTAGE OF UTERUS    . ROTATOR CUFF REPAIR  2006  . squamous cell excised    . THYROIDECTOMY N/A 07/21/2015   Procedure: TOTAL THYROIDECTOMY;  Surgeon: Armandina Gemma, MD;  Location: [facility];  Service: General;  Laterality: N/A;  . TONSILLECTOMY  1952 - approximate  . TOTAL ABDOMINAL HYSTERECTOMY  1978   Social History   Social History  . Marital Status: Divorced    Spouse Name: N/A  . Number of Children: 2   Occupational History  . 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 Smoking status: Never Smoker   . Smokeless tobacco: Never Used  . Alcohol Use: 0.0 oz/week    0 Standard drinks or equivalent per week     Comment: RARELY  . Drug Use: No   Current Outpatient Prescriptions on File Prior to Visit  Medication Sig Dispense Refill  . ibuprofen (ADVIL,MOTRIN) 200 MG tablet Take 200 mg by mouth as needed for moderate pain. Reported on 05/11/2015    . labetalol (NORMODYNE) 200 MG tablet Take 1 tablet (200 mg total) by mouth 2 (two) times  daily. 60 tablet 0  . SYNTHROID 75 MCG tablet TAKE 1 TABLET (75 MCG TOTAL) BY MOUTH DAILY BEFORE BREAKFAST. 45 tablet 2  . triamterene-hydrochlorothiazide (MAXZIDE-25) 37.5-25 MG per tablet Take 1 tablet by mouth daily. 30 tablet 0   No current facility-administered medications on file prior to visit.    Allergies  Allergen Reactions  . Amlodipine Swelling  . Diovan [Valsartan] Swelling  . Lipitor [Atorvastatin Calcium] Other (See Comments)    Caused muscle paralysis in legs  . Codeine Nausea And Vomiting  . Compazine [Prochlorperazine Edisylate] Other (See Comments)    Facial  muscle spasms   . Crestor [Rosuvastatin] Other (See Comments)    Severe joint pain.  . Reglan [Metoclopramide] Other (See Comments)    Facial muscle spasms  . Zoloft [Sertraline Hcl] Other (See Comments)    tremors  . Benadryl [Diphenhydramine Hcl] Palpitations  . Penicillins Other (See Comments)    Yeast infections   Family History  Problem Relation Age of Onset  . Other Father     brain tumor  . COPD Mother    PE: BP 120/74 (BP Location: Left Arm, Patient Position: Sitting, Cuff Size: Normal)   Pulse 63   Ht '5\' 3"'$  (1.6 m)  Wt 168 lb 9.6 oz (76.5 kg)   SpO2 98%   BMI 29.87 kg/m  Body mass index is 29.87 kg/m. Wt Readings from Last 3 Encounters:  04/26/16 168 lb 9.6 oz (76.5 kg)  04/11/16 166 lb (75.3 kg)  01/19/16 166 lb (75.3 kg)   Constitutional: overweight, in NAD Eyes: PERRLA, EOMI, no exophthalmos ENT: moist mucous membranes, Thyroid scar healing, no cervical lymphadenopathy;  Cardiovascular: RRR, No MRG Respiratory: CTA B Gastrointestinal: abdomen soft, NT, ND, BS+ Musculoskeletal: no deformities, strength intact in all 4;  + Dupuytren contraction L hand Skin: moist, warm, no rashes Neurological: no tremor with outstretched hands, DTR normal in all 4  ASSESSMENT: 1. Papillary thyroid cancer  2. Postsurgical hypothyroidism  PLAN: 1. PTC - Reviewed the surgical pathology:  classical PTC, small, 1.1, unilateral. There was no lymphovascular invasion, however, there has been extrathyroidal extension. Because of this, her tumor was considered intermediate risk, rather than low risk. >> had radioactive iodine treatment with Thyrogen (with a high dose of RAI...). A posttx WBS showed no mets. - reviewed last Tg + ATA >> Tg low, ATA undetectable - will recheck these at next visit  2. Postsurgical hypothyroidism - We discussed about her levothyroxine supplementation. She is currently on Synthroid 75 g daily. Has fatigue, depression. Had to come off Zoloft 2/2 tremors. - We discussed about the need to take the thyroid hormone every day, with water, at least 30 minutes before breakfast, separated by at least 4 hours from: - acid reflux medications - calcium - iron - multivitamins She is taking it correctly. - reviewed latest TFTs >> normal 2 mo ago - will recheck today RTC in 6 mo  Office Visit on 04/26/2016  Component Date Value Ref Range Status  . Free T4 04/26/2016 1.13  0.60 - 1.60 ng/dL Final   Comment: Specimens from patients who are undergoing biotin therapy and /or ingesting biotin supplements may contain high levels of biotin.  The higher biotin concentration in these specimens interferes with this Free T4 assay.  Specimens that contain high levels  of biotin may cause false high results for this Free T4 assay.  Please interpret results in light of the total clinical presentation of the patient.    Marland Kitchen TSH 04/26/2016 1.51  0.35 - 4.50 uIU/mL Final   Philemon Kingdom, MD PhD [facility] Endocrinology

## 2016-04-27 ENCOUNTER — Other Ambulatory Visit: Payer: Self-pay | Admitting: Internal Medicine

## 2016-04-27 DIAGNOSIS — E890 Postprocedural hypothyroidism: Secondary | ICD-10-CM

## 2016-04-27 MED ORDER — SYNTHROID 88 MCG PO TABS: 88.0000 ug | ORAL_TABLET | ORAL | 1 refills | Status: DC

## 2016-04-27 MED ORDER — SYNTHROID 75 MCG PO TABS: 75.0000 ug | ORAL_TABLET | ORAL | 2 refills | Status: DC

## 2016-05-08 ENCOUNTER — Ambulatory Visit: Payer: Medicare HMO | Admitting: Internal Medicine

## 2016-05-08 DIAGNOSIS — M720 Palmar fascial fibromatosis [Dupuytren]: Secondary | ICD-10-CM | POA: Diagnosis not present

## 2016-05-09 DIAGNOSIS — C73 Malignant neoplasm of thyroid gland: Secondary | ICD-10-CM | POA: Diagnosis not present

## 2016-05-10 ENCOUNTER — Ambulatory Visit: Payer: Medicare Other | Admitting: Internal Medicine

## 2016-05-11 DIAGNOSIS — L570 Actinic keratosis: Secondary | ICD-10-CM | POA: Diagnosis not present

## 2016-05-11 DIAGNOSIS — E7800 Pure hypercholesterolemia, unspecified: Secondary | ICD-10-CM | POA: Diagnosis not present

## 2016-05-11 DIAGNOSIS — Z85828 Personal history of other malignant neoplasm of skin: Secondary | ICD-10-CM | POA: Diagnosis not present

## 2016-05-11 DIAGNOSIS — M720 Palmar fascial fibromatosis [Dupuytren]: Secondary | ICD-10-CM | POA: Diagnosis not present

## 2016-05-23 DIAGNOSIS — G500 Trigeminal neuralgia: Secondary | ICD-10-CM | POA: Diagnosis not present

## 2016-05-24 ENCOUNTER — Other Ambulatory Visit: Payer: Self-pay | Admitting: Internal Medicine

## 2016-05-24 DIAGNOSIS — G500 Trigeminal neuralgia: Secondary | ICD-10-CM

## 2016-05-27 ENCOUNTER — Ambulatory Visit
Admission: RE | Admit: 2016-05-27 | Discharge: 2016-05-27 | Disposition: A | Discharge status: Home / Self Care | Payer: Medicare HMO | Source: Ambulatory Visit | Attending: Internal Medicine | Admitting: Internal Medicine

## 2016-05-27 DIAGNOSIS — G500 Trigeminal neuralgia: Secondary | ICD-10-CM

## 2016-05-27 DIAGNOSIS — R51 Headache: Secondary | ICD-10-CM | POA: Diagnosis not present

## 2016-06-01 ENCOUNTER — Encounter: Payer: Self-pay | Admitting: Internal Medicine

## 2016-06-01 ENCOUNTER — Other Ambulatory Visit (INDEPENDENT_AMBULATORY_CARE_PROVIDER_SITE_OTHER): Payer: Medicare HMO

## 2016-06-01 DIAGNOSIS — E890 Postprocedural hypothyroidism: Secondary | ICD-10-CM | POA: Diagnosis not present

## 2016-06-01 LAB — T4, FREE: Free T4: 0.92 ng/dL (ref 0.60–1.60)

## 2016-06-01 LAB — TSH: TSH: 1.64 u[IU]/mL (ref 0.35–4.50)

## 2016-06-04 ENCOUNTER — Encounter: Payer: Self-pay | Admitting: Internal Medicine

## 2016-06-27 ENCOUNTER — Ambulatory Visit (INDEPENDENT_AMBULATORY_CARE_PROVIDER_SITE_OTHER): Payer: Medicare HMO | Admitting: Neurology

## 2016-06-27 ENCOUNTER — Encounter: Payer: Self-pay | Admitting: Neurology

## 2016-06-27 VITALS — BP 104/72 | HR 80 | Resp 14 | Ht 63.0 in | Wt 174.0 lb

## 2016-06-27 DIAGNOSIS — Z789 Other specified health status: Secondary | ICD-10-CM

## 2016-06-27 DIAGNOSIS — G4733 Obstructive sleep apnea (adult) (pediatric): Secondary | ICD-10-CM

## 2016-06-27 DIAGNOSIS — G500 Trigeminal neuralgia: Secondary | ICD-10-CM | POA: Diagnosis not present

## 2016-06-27 NOTE — Progress Notes (Addendum)
Provider:  Larey Seat, M D  Referring Provider: Deland Pretty, MD Primary Care Physician:  Horatio Pel, MD  Chief Complaint  Patient presents with  . New Patient (Initial Visit)    Rm 10. Patient is here for evaluation of Trigeminal Neuralgia. She currently pain free.     HPI:  Teresa Molina is a 71 y.o. female  Is seen here as a referral  from Dr. Shelia Media for a suspected Trigeminal neuralgia,  I have pleasure of seeing Mrs. Teresa Molina today as a new patient referred by Dr. Deland Pretty, evaluation of asubsided pain in the right ear. The patient has a history of sleep apnea but could never tolerate CPAP, has been followed by Dr. Baird Lyons. She was diagnosed in winter 2016 thyroid cancer a coincidental finding after a coated Doppler evaluation. She is followed now by Dr. Benjiman Core, the surgeon was Dr. Harlow Asa. She suffers also from hypertension, had a gallbladder removed in [place] hysterectomy in 1977, her gynecologist is Dr. Phineas Real. The patient reports that she had onset of of right facial pain radiating towards the right ear the outside of her right eye and towards the right temple in a period of about 14 days after a surgical gum treatment. She purchased "Sweet oil" as a home remedy which failed to decrease the ear pain.   When she spoke to Dr. Shelia Media ,after 4-5 days,  he diagnosed her with trigeminal neuralgia. Since the pain has subsided now.     Review of Systems: Out of a complete 14 system review, the patient complains of only the following symptoms, and all other reviewed systems are negative. She has apnea, and has been unable to tolerate CPAP - see notes from  Dr. Annamaria Boots:  "sleeps for about 4.5 hours each night; still not using CPAP -can not stand it. She has tried CPAP and BiPAP, been educated on oral appliances and the importance of maintaining normal body weight. Her primary sleep complaint is insomnia and sleep apnea interventions have made that worse.  She feels her sleep status is stable and she lives with it. Educated again on sleep hygiene. Bothered recently by watery rhinorrhea. Discussed antihistamines. Occasional soreness inside nostrils from wiping her nose, using Neosporin. 30 yoF never smoker followed here for allergic rhinitis/ conjunctivitis, and for insomnia with obstructive sleep apnea/ failed CPAP and Provent, complicated by history of migraine, hypertension, GERDMinor seasonal stuffiness, no infection or chest discomfort. Antihistamines if needed.Sleep- bedtime 1:30-6:30 or 7:00AM. Admits tired. Discussed sleep hygiene".     Social History   Social History  . Marital status: Divorced    Spouse name: N/A  . Number of children: 2  . Years of education: college   Occupational History  . 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 Smoking status: Never Smoker  . Smokeless tobacco: Never Used  . Alcohol use 0.0 oz/week     Comment: occasional   . Drug use: No  . Sexual activity: Not Currently     Comment: 1st intercourse 71 yo-More than 5 partners   Other Topics Concern  . Not on file   Social History Narrative   Denies regular caffeine use     Family History  Problem Relation Age of Onset  . Other Father     brain tumor  . COPD Mother     Past Medical History:  Diagnosis Date  .  Allergic conjunctivitis   . ALLERGIC RHINITIS   . Breast cyst    left breast - ultrasound benign 2010  . Cancer ([place])    Thyroid  . Esophageal reflux   . Food allergy    angioedema > strawberries  . GERD (gastroesophageal reflux disease)   . Hypertension   . Insomnia   . OSA on CPAP    NPSG 08-03-09: AHI 17.6; CPAP 12/ AHI 0; PLMA- mild does not wear cpap   . PONV (postoperative nausea and vomiting)     Past Surgical History:  Procedure Laterality Date  . BREAST SURGERY  2013   Rt breast mass excision  . CHOLECYSTECTOMY  1991  . DILATION AND CURETTAGE  OF UTERUS    . ROTATOR CUFF REPAIR  2006  . squamous cell excised    . THYROIDECTOMY N/A 07/21/2015   Procedure: TOTAL THYROIDECTOMY;  Surgeon: Armandina Gemma, MD;  Location: [facility];  Service: General;  Laterality: N/A;  . TONSILLECTOMY  1952 - approximate  . TOTAL ABDOMINAL HYSTERECTOMY  1978    Current Outpatient Prescriptions  Medication Sig Dispense Refill  . Cholecalciferol (VITAMIN D3) 2000 units TABS Take 1 tablet by mouth daily.    Marland Kitchen ibuprofen (ADVIL,MOTRIN) 200 MG tablet Take 200 mg by mouth as needed for moderate pain. Reported on 05/11/2015    . labetalol (NORMODYNE) 200 MG tablet Take 1 tablet (200 mg total) by mouth 2 (two) times daily. 60 tablet 0  . SYNTHROID 75 MCG tablet Take 1 tablet (75 mcg total) by mouth every other day. (Patient taking differently: Take 75 mcg by mouth once a week. ) 45 tablet 2  . SYNTHROID 88 MCG tablet Take 1 tablet (88 mcg total) by mouth every other day. (Patient taking differently: Take 88 mcg by mouth 2 (two) times a week. ) 30 tablet 1  . triamterene-hydrochlorothiazide (MAXZIDE-25) 37.5-25 MG per tablet Take 1 tablet by mouth daily. 30 tablet 0   No current facility-administered medications for this visit.     Allergies as of 06/27/2016 - Review Complete 06/27/2016  Allergen Reaction Noted  . Amlodipine Swelling 12/13/2010  . Diovan [valsartan] Swelling 12/13/2010  . Lipitor [atorvastatin calcium] Other (See Comments) 12/13/2010  . Codeine Nausea And Vomiting   . Compazine [prochlorperazine edisylate] Other (See Comments) 02/24/2013  . Crestor [rosuvastatin] Other (See Comments) 01/19/2016  . Reglan [metoclopramide] Other (See Comments) 02/24/2013  . Zoloft [sertraline hcl] Other (See Comments) 04/26/2016  . Benadryl [diphenhydramine hcl] Palpitations 06/19/2011  . Penicillins Other (See Comments)     Vitals: BP 104/72   Pulse 80   Resp 14   Ht '5\' 3"'$  (1.6 m)   Wt 174 lb (78.9 kg)   BMI 30.82 kg/m  Last Weight:  Wt Readings from  Last 1 Encounters:  06/27/16 174 lb (78.9 kg)   Last Height:   Ht Readings from Last 1 Encounters:  06/27/16 '5\' 3"'$  (1.6 m)    Physical exam:  General: The patient is awake, alert and appears not in acute distress. The patient is well groomed. Head: Normocephalic, atraumatic. Neck is supple. Cardiovascular:  Regular rate and rhythm,without distended neck veins. Respiratory: Lungs are clear to auscultation. Skin:  Without evidence of edema, or rash Trunk: BMI is 31,  elevated and patient  has normal posture.  Neurologic exam : The patient is awake and alert, oriented to place and time.  Memory subjective  described as intact. There is a normal attention span & concentration ability. Speech is fluent  without  dysarthria, dysphonia or aphasia. Mood and affect are appropriate.  Cranial nerves: Pupils are equal and briskly reactive to light. Funduscopic exam without evidence of pallor or edema. Extraocular movements  in vertical and horizontal planes intact and without nystagmus.  Visual fields by finger perimetry are intact. Hearing to finger rub intact.  Facial sensation intact to fine touch. Facial motor strength is symmetric and tongue and uvula move midline. Tongue protrusion into either cheek is normal. Shoulder shrug is normal.   Motor exam:   Normal tone ,muscle bulk and symmetric  strength in all extremities. Sensory:  Fine touch, pinprick and vibration were tested in all extremities. Proprioception was normal. Coordination: Rapid alternating movements in the fingers/hands were normal. Finger-to-nose maneuver  normal without evidence of ataxia, dysmetria or tremor. Gait and station: Patient walks without assistive device and is able unassisted to climb up to the exam table. Strength within normal limits. Stance is stable and normal. Tandem gait is unfragmented. Romberg testing is negative  Deep tendon reflexes: in the  upper and lower extremities are symmetric and intact. Babinski  maneuver response is downgoing.  EXAM: MRI HEAD WITHOUT CONTRAST  TECHNIQUE: Multiplanar, multiecho pulse sequences of the brain and surrounding structures were obtained without intravenous contrast.  COMPARISON:  None.  FINDINGS: Brain: No evidence for acute infarction, hemorrhage, mass lesion, hydrocephalus, or extra-axial fluid. Mild cerebral and cerebellar atrophy. Mild subcortical and periventricular T2 and FLAIR hyperintensities, likely chronic microvascular ischemic change.  Vascular: Normal flow voids.  Skull and upper cervical spine: Normal marrow signal.  Sinuses/Orbits: Negative.  Other: None.  IMPRESSION: Mild atrophy and small vessel disease. No acute intracranial findings.  If concern for dental related cephalgia, CT of the neck with contrast could be performed for further evaluation.   Electronically Signed   By: Staci Righter M.D.   On: 05/27/2016 15:45  Assessment:  After physical and neurologic examination, review of laboratory studies, imaging, neurophysiology testing and pre-existing records, assessment is that of :  Trigeminal nerve involvement of the third branch and second branch of the right face. Neuralgia lasted for several days but subsided. There has been no specific treatment applied. The trigger seems to have been a surgical dental gum treatment. Lower right front jaw.   Plan:  Treatment plan and additional workup : I asked Teresa Molina to return if her symptoms return.  In the meantime, I encouraged her to think about a dental device. Recommended Dr. Katharine Look fuller. Insomnia complaint; she feels able to initiate sleep with delay but she often doesn't stay asleep at all. For much of this is apnea mediated is not clear to me her studies all diagnosed a mild form of obstructive sleep apnea. She does have a crowded lower jaw but no retrognathia. She feels excessively fatigued in daytime now, which is partially attributable to her thyroid  cancer treatment. She has not yet met the sweet spot of her supplement dose.     Teresa Partridge Marqueta Pulley MD 06/27/2016

## 2016-07-03 DIAGNOSIS — M542 Cervicalgia: Secondary | ICD-10-CM | POA: Diagnosis not present

## 2016-07-09 ENCOUNTER — Telehealth: Payer: Self-pay

## 2016-07-09 ENCOUNTER — Telehealth: Payer: Self-pay | Admitting: Internal Medicine

## 2016-07-09 NOTE — Telephone Encounter (Signed)
Called patient and notified of Dr.Gherghe's note. Patient understood had no questions at this time.

## 2016-07-09 NOTE — Telephone Encounter (Signed)
Patient stated she is feeling a tender spot in her neck and a taste come in her mouth like salk. She would like to come in a sooner to see Gherghe Please advise

## 2016-07-09 NOTE — Telephone Encounter (Signed)
Called and notified patient, no questions at this time.

## 2016-07-09 NOTE — Telephone Encounter (Signed)
This actually sounds like a salivary gland problem. I would suggest to see her PCP for it. It would not be related to her previous thyroid history.

## 2016-07-09 NOTE — Telephone Encounter (Signed)
Called patient regarding Dr.Gherghe's note. Patient stated she had seen PCP already, and they did not feel anything on the neck area; but is now tasting salt, patient is getting upset and does not know what to do.  Notified MD of how to proceed.

## 2016-07-09 NOTE — Telephone Encounter (Signed)
This is unfortunately out of the scope of my practice. She may need to see ENT.

## 2016-07-09 NOTE — Telephone Encounter (Signed)
Patient seen PCP, he felt the neck and did not feel anything there; but now she is tasting the salt. The tenderness is almost up under the patient ear. Please advise, patient is not sure what else to do.

## 2016-07-17 DIAGNOSIS — M542 Cervicalgia: Secondary | ICD-10-CM | POA: Diagnosis not present

## 2016-07-20 DIAGNOSIS — K118 Other diseases of salivary glands: Secondary | ICD-10-CM | POA: Diagnosis not present

## 2016-07-20 DIAGNOSIS — C73 Malignant neoplasm of thyroid gland: Secondary | ICD-10-CM | POA: Diagnosis not present

## 2016-07-25 ENCOUNTER — Other Ambulatory Visit (INDEPENDENT_AMBULATORY_CARE_PROVIDER_SITE_OTHER): Payer: Medicare HMO

## 2016-07-25 ENCOUNTER — Other Ambulatory Visit: Payer: Self-pay

## 2016-07-25 DIAGNOSIS — C73 Malignant neoplasm of thyroid gland: Secondary | ICD-10-CM

## 2016-07-25 LAB — T4, FREE: Free T4: 1.02 ng/dL (ref 0.60–1.60)

## 2016-07-25 LAB — TSH: TSH: 2.28 u[IU]/mL (ref 0.35–4.50)

## 2016-10-02 ENCOUNTER — Other Ambulatory Visit: Payer: Self-pay

## 2016-10-02 MED ORDER — SYNTHROID 75 MCG PO TABS: 75.0000 ug | ORAL_TABLET | ORAL | 2 refills | Status: DC

## 2016-10-03 ENCOUNTER — Other Ambulatory Visit: Payer: Self-pay

## 2016-10-03 MED ORDER — SYNTHROID 75 MCG PO TABS: 75.0000 ug | ORAL_TABLET | ORAL | 2 refills | Status: DC

## 2016-10-15 ENCOUNTER — Other Ambulatory Visit: Payer: Self-pay

## 2016-10-15 MED ORDER — SYNTHROID 75 MCG PO TABS: 75.0000 ug | ORAL_TABLET | ORAL | 0 refills | Status: DC

## 2016-10-15 MED ORDER — SYNTHROID 75 MCG PO TABS: 75.0000 ug | ORAL_TABLET | ORAL | 2 refills | Status: DC

## 2016-10-25 ENCOUNTER — Ambulatory Visit (INDEPENDENT_AMBULATORY_CARE_PROVIDER_SITE_OTHER): Payer: Medicare HMO | Admitting: Internal Medicine

## 2016-10-25 ENCOUNTER — Ambulatory Visit
Admission: RE | Admit: 2016-10-25 | Discharge: 2016-10-25 | Disposition: A | Discharge status: Home / Self Care | Payer: Medicare HMO | Source: Ambulatory Visit | Attending: Internal Medicine | Admitting: Internal Medicine

## 2016-10-25 ENCOUNTER — Encounter: Payer: Self-pay | Admitting: Internal Medicine

## 2016-10-25 VITALS — BP 124/80 | HR 66 | Wt 180.0 lb

## 2016-10-25 DIAGNOSIS — E890 Postprocedural hypothyroidism: Secondary | ICD-10-CM | POA: Diagnosis not present

## 2016-10-25 DIAGNOSIS — C73 Malignant neoplasm of thyroid gland: Secondary | ICD-10-CM | POA: Diagnosis not present

## 2016-10-25 LAB — T4, FREE: Free T4: 0.87 ng/dL (ref 0.60–1.60)

## 2016-10-25 LAB — TSH: TSH: 2.29 u[IU]/mL (ref 0.35–4.50)

## 2016-10-25 MED ORDER — SYNTHROID 88 MCG PO TABS: 88.0000 ug | ORAL_TABLET | ORAL | 1 refills | Status: DC

## 2016-10-25 MED ORDER — SYNTHROID 75 MCG PO TABS: ORAL_TABLET | ORAL | Status: DC

## 2016-10-25 NOTE — Patient Instructions (Signed)
Please continue Synthoid at the current dose.  Take the thyroid hormone every day, with water, at least 30 minutes before breakfast, separated by at least 4 hours from: - acid reflux medications - calcium - iron - multivitamins  Please schedule the neck ultrasound.  Please come back for a follow-up appointment in 6 months.

## 2016-10-25 NOTE — Progress Notes (Addendum)
Patient ID: Teresa Molina, female   DOB: [birth year], 71 y.o.   MRN: [id number]   HPI  Teresa Molina is a 71 y.o.-year-old female, initially referred by Dr. Phineas Real, for evaluation for a left thyroid nodule >> dx'd as PTC. Last visit 6 mo ago. PCP: Dr. Shelia Media.  She was dx'ed with a slightly dysfunctional parotid glad by ENT >> possibly 2/2 RAI tx. Shee now has sxs from the other parotid.  Reviewed hx: Patient has been found to have an incidental thyroid nodule during a recent carotid ultrasound, on 03/10/2015. The nodule was seen in the left lobe and measured 0.9 x 0.8 x 0.9 centimeters.  We checked a thyroid U/S (05/23/2015): Left lower pole solid hypoechoic nodule measures 10 x 8 x 8 mm, nonspecific. There appears to be minor associated microcalcification.    L nodule was small, but hypoechoic and with possible small calcifications >> I suggested FNA.  Adequacy Reason Satisfactory For Evaluation. Diagnosis THYROID, LEFT LOBE INFERIOR, FINE NEEDLE ASPIRATION (SPECIMEN 1 OF 1, COLLECTED ON 06/07/2015): POSITIVE FOR PAPILLARY THYROID CARCINOMA ([place] CATEGORY VI). [name] MD Pathologist, Electronic Signature (Case signed 06/08/2015) Specimen Clinical Information Left lower pole solid hypoechoic nodule measures 10 x 8 x 8 mm, nonspecific, There appears to be minor associated microcalcification Source Thyroid, Fine Needle Aspiration, Left Lobe Inferior, (Specimen 1 of 1, collected on 06/07/15 )  Patient had total thyroidectomy by Dr. Harlow Asa on 07/21/2015. Final pathology showed: Diagnosis Thyroid, thyroidectomy, total - PAPILLARY CARCINOMA, CLASSIC VARIANT, SPANNING 1.1 CM. - EXTRATHYROIDAL EXTENSION PRESENT. - RESECTION MARGINS ARE NEGATIVE. - NO LYMPHOVASCULAR INVASION. - PARATHYROID TISSUE PRESENT. - SEE ONCOLOGY TABLE. Microscopic Comment THYROID Specimen: Total thyroid. Procedure (including lymph node sampling if applicable): Total thyroidectomy. Specimen Integrity  (intact/fragmented): Intact. Tumor focality: Unifocal. Dominant tumor: Maximum tumor size (cm): 1.1 cm. Tumor laterality: Left. Histologic type (including subtype and/or unique features as applicable): Papillary carcinoma, classic variant. Tumor capsule: Partial. Extrathyroidal extension: Present. Capsular invasion with degree of invasion if present: N/A. Margins: Negative. Lymphatic or vascular invasion: Not identified. Lymph nodes: # examined 0; # positive; 0 TNM code: pT3, pNX Non-neoplastic thyroid: Nodular hyperplasia, benign parathyroid tissue.  10/19/2015: RAI tx 123 mCi 10/28/2015: post-tx WBS: no mets  Pt is on Synthroid 75 mcg daily 5x a week and 88 mcg 2x a week. - in am - fasting - 1h from b'fast - no Ca, Fe, MVI, PPIs - not on Biotin  I reviewed pt's thyroid tests: Lab Results  Component Value Date   TSH 2.28 07/25/2016   TSH 1.64 06/01/2016   TSH 1.51 04/26/2016   TSH 0.74 02/23/2016   TSH 0.64 11/07/2015   TSH 0.98 08/29/2015   FREET4 1.02 07/25/2016   FREET4 0.92 06/01/2016   FREET4 1.13 04/26/2016   FREET4 0.80 02/23/2016   FREET4 1.18 11/07/2015   FREET4 1.28 08/29/2015  01/02/2016: TSH 0.22. 03/07/2015: TSH 3.22, fT4 0.99   She had a Calcium of 7.4 postop >> started CaCO3 >> calcium normalized.  Pt denies: - feeling nodules in neck - hoarseness - dysphagia - choking - SOB with lying down  No FH of thyroid ds. No FH of thyroid cancer. No FH of thyroid cancer. No h/o radiation tx to head or neck.  No seaweed or kelp. No recent contrast studies. No herbal supplements. No Biotin use. No recent steroids use.   She has a history of HTN, HL, GERD, and sleep apnea.  ROS: Constitutional: + weight gain, + fatigue, no subjective  hyperthermia, no subjective hypothermia Eyes: no blurry vision, no xerophthalmia ENT: no sore throat, no nodules palpated in throat, no dysphagia, no odynophagia, no hoarseness Cardiovascular: no CP/no SOB/no  palpitations/no leg swelling Respiratory: no cough/no SOB/no wheezing Gastrointestinal: no N/no V/no D/no C/no acid reflux Musculoskeletal: no muscle aches/no joint aches Skin: no rashes, no hair loss Neurological: no tremors/no numbness/no tingling/no dizziness  I reviewed pt's medications, allergies, PMH, social hx, family hx, and changes were documented in the history of present illness. Otherwise, unchanged from my initial visit note.  Past Medical History:  Diagnosis Date  . Allergic conjunctivitis   . ALLERGIC RHINITIS   . Breast cyst    left breast - ultrasound benign 2010  . Cancer ([place])    Thyroid  . Esophageal reflux   . Food allergy    angioedema > strawberries  . GERD (gastroesophageal reflux disease)   . Hypertension   . Insomnia   . OSA on CPAP    NPSG 08-03-09: AHI 17.6; CPAP 12/ AHI 0; PLMA- mild does not wear cpap   . PONV (postoperative nausea and vomiting)    Past Surgical History:  Procedure Laterality Date  . BREAST SURGERY  2013   Rt breast mass excision  . CHOLECYSTECTOMY  1991  . DILATION AND CURETTAGE OF UTERUS    . ROTATOR CUFF REPAIR  2006  . squamous cell excised    . THYROIDECTOMY N/A 07/21/2015   Procedure: TOTAL THYROIDECTOMY;  Surgeon: Armandina Gemma, MD;  Location: [facility];  Service: General;  Laterality: N/A;  . TONSILLECTOMY  1952 - approximate  . TOTAL ABDOMINAL HYSTERECTOMY  1978   Social History   Social History  . Marital Status: Divorced    Spouse Name: N/A  . Number of Children: 2   Occupational History  . 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 Smoking status: Never Smoker   . Smokeless tobacco: Never Used  . Alcohol Use: 0.0 oz/week    0 Standard drinks or equivalent per week     Comment: RARELY  . Drug Use: No   Current Outpatient Prescriptions on File Prior to Visit  Medication Sig Dispense Refill  . Cholecalciferol (VITAMIN  D3) 2000 units TABS Take 1 tablet by mouth daily.    Marland Kitchen ibuprofen (ADVIL,MOTRIN) 200 MG tablet Take 200 mg by mouth as needed for moderate pain. Reported on 05/11/2015    . labetalol (NORMODYNE) 200 MG tablet Take 1 tablet (200 mg total) by mouth 2 (two) times daily. 60 tablet 0  . SYNTHROID 75 MCG tablet Take 1 tablet (75 mcg total) by mouth every other day. 90 tablet 0  . SYNTHROID 88 MCG tablet Take 1 tablet (88 mcg total) by mouth every other day. (Patient taking differently: Take 88 mcg by mouth 2 (two) times a week. ) 30 tablet 1  . triamterene-hydrochlorothiazide (MAXZIDE-25) 37.5-25 MG per tablet Take 1 tablet by mouth daily. 30 tablet 0   No current facility-administered medications on file prior to visit.    Allergies  Allergen Reactions  . Amlodipine Swelling  . Diovan [Valsartan] Swelling  . Lipitor [Atorvastatin Calcium] Other (See Comments)    Caused muscle paralysis in legs  . Codeine Nausea And Vomiting  . Compazine [Prochlorperazine Edisylate] Other (See Comments)    Facial  muscle spasms   . Crestor [Rosuvastatin] Other (See Comments)    Severe joint  pain.  . Reglan [Metoclopramide] Other (See Comments)    Facial muscle spasms  . Zoloft [Sertraline Hcl] Other (See Comments)    tremors  . Benadryl [Diphenhydramine Hcl] Palpitations  . Penicillins Other (See Comments)    Yeast infections   Family History  Problem Relation Age of Onset  . Other Father        brain tumor  . COPD Mother    PE: BP 124/80 (BP Location: Left Arm, Patient Position: Sitting)   Pulse 66   Wt 180 lb (81.6 kg)   SpO2 96%   BMI 31.89 kg/m  Body mass index is 31.89 kg/m. Wt Readings from Last 3 Encounters:  10/25/16 180 lb (81.6 kg)  06/27/16 174 lb (78.9 kg)  04/26/16 168 lb 9.6 oz (76.5 kg)   Constitutional: overweight, in NAD Eyes: PERRLA, EOMI, no exophthalmos ENT: moist mucous membranes, no neck masses, no cervical lymphadenopathy Cardiovascular: RRR, No MRG Respiratory: CTA  B Gastrointestinal: abdomen soft, NT, ND, BS+ Musculoskeletal: strength intact in all 4, + Dupuytren contraction L hand Skin: moist, warm, no rashes Neurological: no tremor with outstretched hands, DTR normal in all 4  ASSESSMENT: 1. Papillary thyroid cancer  2. Postsurgical hypothyroidism  PLAN: 1. PTC - Reviewed the surgical pathology: classical PTC, small, 1.1, unilateral. There was no lymphovascular invasion, however, there has been extrathyroidal extension. Because of this, her tumor was considered intermediate risk, rather than low risk. >> had radioactive iodine treatment with Thyrogen (with a rather high dose of RAI...). A posttx WBS showed no mets - reviewed last Tg + ATA >> Tg low, ATA undetectable - will recheck these now - will also check a neck U/S now, 1 year after her surgery  2. Postsurgical hypothyroidism - - latest thyroid labs reviewed with pt >> normal  - she continues on LT4 DAW 88 mcg 2/7 days and 75 mcg 5/7 days - pt feels still fatigued and has weight gain - we discussed about taking the thyroid hormone every day, with water, >30 minutes before breakfast, separated by >4 hours from acid reflux medications, calcium, iron, multivitamins. Pt. is taking it correctly - will check thyroid tests today: TSH and fT4 - If labs are abnormal, she will need to return for repeat TFTs in 1.5 months - OTW, RTC in 6 mo  US SOFT TISSUE HEAD AND NECK  Order: [id number]  Status:  Final result Visible to patient:  No (Not Released) Dx:  Thyroid cancer [facility])  Details   Reading Physician Reading Date Result Priority  Corrie Mckusick, DO 10/25/2016   Narrative    CLINICAL DATA: 71 year old female with a history of thyroidectomy 07/21/2015 for papillary thyroid carcinoma.  EXAM: THYROID ULTRASOUND  TECHNIQUE: Ultrasound examination of the thyroid gland and adjacent soft tissues was performed.  COMPARISON: Nuclear medicine study 10/28/2015,  ultrasound 05/23/2015  FINDINGS: Status post thyroidectomy with no residual thyroid tissue identified.  No adenopathy.  IMPRESSION: Sonographic survey demonstrates surgical changes of total thyroidectomy without residual thyroid tissue or adenopathy.   Electronically Signed By: Corrie Mckusick D.O. On: 10/25/2016 17:25       Normal U/S after thyroidectomy.  Component     Latest Ref Rng & Units 10/25/2016  TSH     0.35 - 4.50 uIU/mL 2.29  T4,Free(Direct)     0.60 - 1.60 ng/dL 0.87  Thyroglobulin     ng/mL <0.1 (L)  Thyroglobulin Ab     <2 IU/mL <1  Great labs!  Philemon Kingdom, MD PhD [facility]

## 2016-10-26 ENCOUNTER — Encounter: Payer: Self-pay | Admitting: Internal Medicine

## 2016-10-26 LAB — THYROGLOBULIN LEVEL: Thyroglobulin: <0.1 ng/mL — ABNORMAL LOW

## 2016-10-26 LAB — THYROGLOBULIN ANTIBODY

## 2016-10-29 ENCOUNTER — Other Ambulatory Visit: Payer: Self-pay

## 2016-11-26 ENCOUNTER — Other Ambulatory Visit: Payer: Self-pay | Admitting: Internal Medicine

## 2016-12-04 DIAGNOSIS — H25813 Combined forms of age-related cataract, bilateral: Secondary | ICD-10-CM | POA: Diagnosis not present

## 2016-12-04 DIAGNOSIS — H353131 Nonexudative age-related macular degeneration, bilateral, early dry stage: Secondary | ICD-10-CM | POA: Diagnosis not present

## 2016-12-10 DIAGNOSIS — H25811 Combined forms of age-related cataract, right eye: Secondary | ICD-10-CM | POA: Diagnosis not present

## 2016-12-10 DIAGNOSIS — H2511 Age-related nuclear cataract, right eye: Secondary | ICD-10-CM | POA: Diagnosis not present

## 2016-12-19 DIAGNOSIS — H2512 Age-related nuclear cataract, left eye: Secondary | ICD-10-CM | POA: Diagnosis not present

## 2016-12-24 DIAGNOSIS — H25812 Combined forms of age-related cataract, left eye: Secondary | ICD-10-CM | POA: Diagnosis not present

## 2016-12-24 DIAGNOSIS — H2512 Age-related nuclear cataract, left eye: Secondary | ICD-10-CM | POA: Diagnosis not present

## 2017-01-27 ENCOUNTER — Encounter: Payer: Self-pay | Admitting: Internal Medicine

## 2017-01-28 ENCOUNTER — Other Ambulatory Visit: Payer: Self-pay

## 2017-01-28 MED ORDER — SYNTHROID 88 MCG PO TABS: 88.0000 ug | ORAL_TABLET | ORAL | 1 refills | Status: DC

## 2017-01-28 MED ORDER — SYNTHROID 75 MCG PO TABS: ORAL_TABLET | ORAL | 3 refills | Status: DC

## 2017-02-08 DIAGNOSIS — I788 Other diseases of capillaries: Secondary | ICD-10-CM | POA: Diagnosis not present

## 2017-02-08 DIAGNOSIS — L814 Other melanin hyperpigmentation: Secondary | ICD-10-CM | POA: Diagnosis not present

## 2017-02-08 DIAGNOSIS — D2262 Melanocytic nevi of left upper limb, including shoulder: Secondary | ICD-10-CM | POA: Diagnosis not present

## 2017-02-08 DIAGNOSIS — D485 Neoplasm of uncertain behavior of skin: Secondary | ICD-10-CM | POA: Diagnosis not present

## 2017-02-08 DIAGNOSIS — L570 Actinic keratosis: Secondary | ICD-10-CM | POA: Diagnosis not present

## 2017-02-08 DIAGNOSIS — D2261 Melanocytic nevi of right upper limb, including shoulder: Secondary | ICD-10-CM | POA: Diagnosis not present

## 2017-02-08 DIAGNOSIS — D225 Melanocytic nevi of trunk: Secondary | ICD-10-CM | POA: Diagnosis not present

## 2017-02-08 DIAGNOSIS — D2272 Melanocytic nevi of left lower limb, including hip: Secondary | ICD-10-CM | POA: Diagnosis not present

## 2017-02-08 DIAGNOSIS — D2239 Melanocytic nevi of other parts of face: Secondary | ICD-10-CM | POA: Diagnosis not present

## 2017-02-08 DIAGNOSIS — D2271 Melanocytic nevi of right lower limb, including hip: Secondary | ICD-10-CM | POA: Diagnosis not present

## 2017-02-08 DIAGNOSIS — L821 Other seborrheic keratosis: Secondary | ICD-10-CM | POA: Diagnosis not present

## 2017-04-08 DIAGNOSIS — Z23 Encounter for immunization: Secondary | ICD-10-CM | POA: Diagnosis not present

## 2017-04-08 DIAGNOSIS — E7800 Pure hypercholesterolemia, unspecified: Secondary | ICD-10-CM | POA: Diagnosis not present

## 2017-04-08 DIAGNOSIS — E039 Hypothyroidism, unspecified: Secondary | ICD-10-CM | POA: Diagnosis not present

## 2017-04-08 DIAGNOSIS — I10 Essential (primary) hypertension: Secondary | ICD-10-CM | POA: Diagnosis not present

## 2017-04-08 DIAGNOSIS — E559 Vitamin D deficiency, unspecified: Secondary | ICD-10-CM | POA: Diagnosis not present

## 2017-04-08 DIAGNOSIS — Z0000 Encounter for general adult medical examination without abnormal findings: Secondary | ICD-10-CM | POA: Diagnosis not present

## 2017-04-11 DIAGNOSIS — K219 Gastro-esophageal reflux disease without esophagitis: Secondary | ICD-10-CM | POA: Diagnosis not present

## 2017-04-11 DIAGNOSIS — E7800 Pure hypercholesterolemia, unspecified: Secondary | ICD-10-CM | POA: Diagnosis not present

## 2017-04-11 DIAGNOSIS — E876 Hypokalemia: Secondary | ICD-10-CM | POA: Diagnosis not present

## 2017-04-11 DIAGNOSIS — Z6830 Body mass index (BMI) 30.0-30.9, adult: Secondary | ICD-10-CM | POA: Diagnosis not present

## 2017-04-11 DIAGNOSIS — I10 Essential (primary) hypertension: Secondary | ICD-10-CM | POA: Diagnosis not present

## 2017-04-11 DIAGNOSIS — E890 Postprocedural hypothyroidism: Secondary | ICD-10-CM | POA: Diagnosis not present

## 2017-04-11 DIAGNOSIS — G4733 Obstructive sleep apnea (adult) (pediatric): Secondary | ICD-10-CM | POA: Diagnosis not present

## 2017-04-11 DIAGNOSIS — Z0001 Encounter for general adult medical examination with abnormal findings: Secondary | ICD-10-CM | POA: Diagnosis not present

## 2017-04-11 DIAGNOSIS — E559 Vitamin D deficiency, unspecified: Secondary | ICD-10-CM | POA: Diagnosis not present

## 2017-04-11 DIAGNOSIS — D696 Thrombocytopenia, unspecified: Secondary | ICD-10-CM | POA: Diagnosis not present

## 2017-04-12 ENCOUNTER — Encounter: Payer: Self-pay | Admitting: Gynecology

## 2017-04-12 ENCOUNTER — Ambulatory Visit: Payer: Medicare HMO | Admitting: Gynecology

## 2017-04-12 VITALS — BP 120/74 | Ht 62.5 in | Wt 178.0 lb

## 2017-04-12 DIAGNOSIS — Z01411 Encounter for gynecological examination (general) (routine) with abnormal findings: Secondary | ICD-10-CM

## 2017-04-12 DIAGNOSIS — Z1272 Encounter for screening for malignant neoplasm of vagina: Secondary | ICD-10-CM

## 2017-04-12 DIAGNOSIS — N952 Postmenopausal atrophic vaginitis: Secondary | ICD-10-CM

## 2017-04-12 NOTE — Patient Instructions (Addendum)
Follow up for bone density as scheduled  Follow-up in 1 year, sooner if any issues

## 2017-04-12 NOTE — Addendum Note (Signed)
Addended by: Nelva Nay on: 04/12/2017 11:57 AM   Modules accepted: Orders

## 2017-04-12 NOTE — Progress Notes (Signed)
    Teresa Molina 05/03/1946 [id number]        70 y.o.  [id number] for breast and pelvic exam.  Doing well without gynecologic complaints  Past medical history,surgical history, problem list, medications, allergies, family history and social history were all reviewed and documented as reviewed in the EPIC chart.  ROS:  Performed with pertinent positives and negatives included in the history, assessment and plan.   Additional significant findings : None   Exam: Caryn Bee assistant Vitals:   04/12/17 1057  BP: 120/74  Weight: 178 lb (80.7 kg)  Height: 5' 2.5" (1.588 m)   Body mass index is 32.04 kg/m.  General appearance:  Normal affect, orientation and appearance. Skin: Grossly normal HEENT: Without gross lesions.  No cervical or supraclavicular adenopathy. Thyroid normal.  Lungs:  Clear without wheezing, rales or rhonchi Cardiac: RR, without RMG Abdominal:  Soft, nontender, without masses, guarding, rebound, organomegaly or hernia Breasts:  Examined lying and sitting without masses, retractions, discharge or axillary adenopathy. Pelvic:  Ext, BUS, Vagina: With atrophic changes.  Pap smear of vaginal cuff.  Adnexa: Without masses or tenderness    Anus and perineum: Normal   Rectovaginal: Normal sphincter tone without palpated masses or tenderness.    Assessment/Plan:  71 y.o. [id number] female for breast and pelvic exam, status post hysterectomy in the past for benign indications.   1. Postmenopausal/atrophic genital changes.  No significant hot flushes, night sweats or vaginal dryness.  2. Pap smear 2015.  Pap smear of vaginal cuff done today.  No history of abnormal Pap smears.  Options to stop screening per current screening guidelines based on age and hysterectomy history reviewed.  Will readdress on an annual basis. 3. DEXA 2013 normal.  Recommend follow-up DEXA now and patient will schedule in follow-up for this. 4. Colonoscopy 2014.  She is going to schedule next year as  recommended. 5. Mammography 04/2016.  Follow-up for mammogram next month.  SBE monthly reviewed.  Breast exam normal today. 6. Health maintenance.  No routine lab work done as patient does this elsewhere.  Follow-up 1 year, sooner as needed.   [name] MD, 11:29 AM 04/12/2017

## 2017-04-15 LAB — PAP IG W/ RFLX HPV ASCU

## 2017-04-24 DIAGNOSIS — E119 Type 2 diabetes mellitus without complications: Secondary | ICD-10-CM | POA: Diagnosis not present

## 2017-04-24 DIAGNOSIS — E876 Hypokalemia: Secondary | ICD-10-CM | POA: Diagnosis not present

## 2017-04-24 DIAGNOSIS — I10 Essential (primary) hypertension: Secondary | ICD-10-CM | POA: Diagnosis not present

## 2017-04-25 ENCOUNTER — Other Ambulatory Visit: Payer: Self-pay | Admitting: Internal Medicine

## 2017-04-25 MED ORDER — SYNTHROID 88 MCG PO TABS: ORAL_TABLET | ORAL | 1 refills | Status: DC

## 2017-04-26 ENCOUNTER — Ambulatory Visit: Payer: Medicare HMO | Admitting: Internal Medicine

## 2017-04-26 ENCOUNTER — Encounter: Payer: Self-pay | Admitting: Internal Medicine

## 2017-04-26 VITALS — BP 122/78 | HR 67 | Ht 62.75 in | Wt 174.4 lb

## 2017-04-26 DIAGNOSIS — E890 Postprocedural hypothyroidism: Secondary | ICD-10-CM

## 2017-04-26 DIAGNOSIS — Z85850 Personal history of malignant neoplasm of thyroid: Secondary | ICD-10-CM | POA: Diagnosis not present

## 2017-04-26 DIAGNOSIS — C73 Malignant neoplasm of thyroid gland: Secondary | ICD-10-CM | POA: Diagnosis not present

## 2017-04-26 MED ORDER — SYNTHROID 88 MCG PO TABS: ORAL_TABLET | ORAL | 3 refills | Status: DC

## 2017-04-26 NOTE — Progress Notes (Addendum)
Patient ID: Teresa Molina, female   DOB: [birth year], 71 y.o.   MRN: [id number]   HPI  Teresa Molina is a 71 y.o.-year-old female, initially referred by Dr. Rush Farmer for f/u for papillary thyroid cancer and postsurgical hypothyroidism. Last visit 6 mo ago. PCP: Dr. Shelia Media.  Reviewed and addended hx:: Patient has been found to have an incidental thyroid nodule during a recent carotid ultrasound, on 03/10/2015. The nodule was seen in the left lobe and measured 0.9 x 0.8 x 0.9 centimeters.  Thyroid U/S (05/23/2015): Left lower pole solid hypoechoic nodule measures 10 x 8 x 8 mm, nonspecific. There appears to be minor associated microcalcification.    L nodule was small, but hypoechoic and with possible small calcifications >> I suggested FNA.  Adequacy Reason Satisfactory For Evaluation. Diagnosis THYROID, LEFT LOBE INFERIOR, FINE NEEDLE ASPIRATION (SPECIMEN 1 OF 1, COLLECTED ON 06/07/2015): POSITIVE FOR PAPILLARY THYROID CARCINOMA ([place] CATEGORY VI). [name] MD Pathologist, Electronic Signature (Case signed 06/08/2015) Specimen Clinical Information Left lower pole solid hypoechoic nodule measures 10 x 8 x 8 mm, nonspecific, There appears to be minor associated microcalcification Source Thyroid, Fine Needle Aspiration, Left Lobe Inferior, (Specimen 1 of 1, collected on 06/07/15 )  Patient had total thyroidectomy by Dr. Harlow Asa on 07/21/2015. Final pathology showed: Diagnosis Thyroid, thyroidectomy, total - PAPILLARY CARCINOMA, CLASSIC VARIANT, SPANNING 1.1 CM. - EXTRATHYROIDAL EXTENSION PRESENT. - RESECTION MARGINS ARE NEGATIVE. - NO LYMPHOVASCULAR INVASION. - PARATHYROID TISSUE PRESENT. - SEE ONCOLOGY TABLE. Microscopic Comment THYROID Specimen: Total thyroid. Procedure (including lymph node sampling if applicable): Total thyroidectomy. Specimen Integrity (intact/fragmented): Intact. Tumor focality: Unifocal. Dominant tumor: Maximum tumor size (cm): 1.1  cm. Tumor laterality: Left. Histologic type (including subtype and/or unique features as applicable): Papillary carcinoma, classic variant. Tumor capsule: Partial. Extrathyroidal extension: Present. Capsular invasion with degree of invasion if present: N/A. Margins: Negative. Lymphatic or vascular invasion: Not identified. Lymph nodes: # examined 0; # positive; 0 TNM code: pT3, pNX Non-neoplastic thyroid: Nodular hyperplasia, benign parathyroid tissue.  10/19/2015: RAI tx 123 mCi 10/28/2015: post-tx WBS: no mets 10/25/2016: Neck U/S: no residual thyroid tissue or adenopathy  At last check, Tg was undetectable, as were the ATA antibodies: Lab Results  Component Value Date   THYROGLB <0.1 (L) 10/25/2016   THYROGLB 0.3 (L) 11/07/2015   THGAB <1 10/25/2016   THGAB <1 11/07/2015   Pt is on Synthroid DAW 88 mcg daily 5/7 days and 75 mcg 2/7 days (changed by PCP 04/08/2017): - in am - fasting - at least 30 min from b'fast - no Ca, Fe, MVI, PPIs - + Pepcid in the pm occas. - not on Biotin  I reviewed pt's thyroid tests: 04/05/2017: TSH 7.87 Lab Results  Component Value Date   TSH 2.29 10/25/2016   TSH 2.28 07/25/2016   TSH 1.64 06/01/2016   TSH 1.51 04/26/2016   TSH 0.74 02/23/2016   TSH 0.64 11/07/2015   TSH 0.98 08/29/2015   FREET4 0.87 10/25/2016   FREET4 1.02 07/25/2016   FREET4 0.92 06/01/2016   FREET4 1.13 04/26/2016   FREET4 0.80 02/23/2016   FREET4 1.18 11/07/2015   FREET4 1.28 08/29/2015  01/02/2016: TSH 0.22. 03/07/2015: TSH 3.22, fT4 0.99   She had a Calcium of 7.4 postop >> started CaCO3 >> calcium normalized. Lab Results  Component Value Date   CALCIUM 8.8 11/07/2015   CALCIUM 7.4 (L) 07/22/2015   CALCIUM 9.4 07/13/2015   Pt denies: - feeling nodules in neck - hoarseness - dysphagia - choking -  SOB with lying down  No FH of thyroid ds. No FH of thyroid cancer. No FH of thyroid cancer. No h/o radiation tx to head or neck.  No seaweed or kelp.  No recent contrast studies. No herbal supplements. No Biotin use. No recent steroids use.   She has a history of HTN, HL, GERD, and sleep apnea. She had cataract sx since last visit.  Recent HbA1c from PCP; 5.9% >> started to work on diet to lose weight and improve her blood glu levels. Lost 6 lbs in last 6 mo, but she only started to work on diet n last 3 weeks.  ROS: Constitutional: + weight loss, + fatigue, no subjective hyperthermia, no subjective hypothermia Eyes: no blurry vision, no xerophthalmia ENT: no sore throat, no nodules palpated in throat, no dysphagia, no odynophagia, no hoarseness Cardiovascular: no CP/no SOB/no palpitations/no leg swelling Respiratory: no cough/no SOB/no wheezing Gastrointestinal: no N/no V/no D/no C/no acid reflux Musculoskeletal: no muscle aches/no joint aches Skin: no rashes, no hair loss Neurological: no tremors/no numbness/no tingling/no dizziness  I reviewed pt's medications, allergies, PMH, social hx, family hx, and changes were documented in the history of present illness. Otherwise, unchanged from my initial visit note.  Past Medical History:  Diagnosis Date  . Allergic conjunctivitis   . ALLERGIC RHINITIS   . Breast cyst    left breast - ultrasound benign 2010  . Cancer ([place])    Thyroid  . Esophageal reflux   . Food allergy    angioedema > strawberries  . GERD (gastroesophageal reflux disease)   . Hypertension   . Insomnia   . OSA on CPAP    NPSG 08-03-09: AHI 17.6; CPAP 12/ AHI 0; PLMA- mild does not wear cpap   . PONV (postoperative nausea and vomiting)    Past Surgical History:  Procedure Laterality Date  . BREAST SURGERY  2013   Rt breast mass excision  . CATARACT EXTRACTION, BILATERAL    . CHOLECYSTECTOMY  1991  . DILATION AND CURETTAGE OF UTERUS    . ROTATOR CUFF REPAIR  2006  . squamous cell excised    . THYROIDECTOMY N/A 07/21/2015   Procedure: TOTAL THYROIDECTOMY;  Surgeon: Armandina Gemma, MD;  Location: [facility];  Service:  General;  Laterality: N/A;  . TONSILLECTOMY  1952 - approximate  . TOTAL ABDOMINAL HYSTERECTOMY  1978   Social History   Social History  . Marital Status: Divorced    Spouse Name: N/A  . Number of Children: 2   Occupational History  . 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 Smoking status: Never Smoker   . Smokeless tobacco: Never Used  . Alcohol Use: 0.0 oz/week    0 Standard drinks or equivalent per week     Comment: RARELY  . Drug Use: No   Current Outpatient Medications on File Prior to Visit  Medication Sig Dispense Refill  . Cholecalciferol (VITAMIN D3) 2000 units TABS Take 1 tablet by mouth daily.    Marland Kitchen ibuprofen (ADVIL,MOTRIN) 200 MG tablet Take 200 mg by mouth as needed for moderate pain. Reported on 05/11/2015    . labetalol (NORMODYNE) 200 MG tablet Take 1 tablet (200 mg total) by mouth 2 (two) times daily. 60 tablet 0  . SYNTHROID 75 MCG tablet Take 1 tab 5x a week in am 30 tablet 3  . SYNTHROID 88 MCG tablet Take  1 tab 2x a week in am 30 tablet 1  . triamterene-hydrochlorothiazide (MAXZIDE-25) 37.5-25 MG per tablet Take 1 tablet by mouth daily. 30 tablet 0   No current facility-administered medications on file prior to visit.    Allergies  Allergen Reactions  . Amlodipine Swelling  . Diovan [Valsartan] Swelling  . Lipitor [Atorvastatin Calcium] Other (See Comments)    Caused muscle paralysis in legs  . Codeine Nausea And Vomiting  . Compazine [Prochlorperazine Edisylate] Other (See Comments)    Facial  muscle spasms   . Crestor [Rosuvastatin] Other (See Comments)    Severe joint pain.  . Reglan [Metoclopramide] Other (See Comments)    Facial muscle spasms  . Zoloft [Sertraline Hcl] Other (See Comments)    tremors  . Benadryl [Diphenhydramine Hcl] Palpitations  . Penicillins Other (See Comments)    Yeast infections   Family History  Problem Relation Age of  Onset  . Other Father        brain tumor  . COPD Mother    PE: BP 122/78   Pulse 67   Ht 5' 2.75" (1.594 m)   Wt 174 lb 6.4 oz (79.1 kg)   SpO2 98%   BMI 31.14 kg/m  Body mass index is 31.14 kg/m. Wt Readings from Last 3 Encounters:  04/26/17 174 lb 6.4 oz (79.1 kg)  04/12/17 178 lb (80.7 kg)  10/25/16 180 lb (81.6 kg)   Constitutional: overweight, in NAD Eyes: PERRLA, EOMI, no exophthalmos ENT: moist mucous membranes, no neck masses palpable, no cervical lymphadenopathy Cardiovascular: RRR, No MRG Respiratory: CTA B Gastrointestinal: abdomen soft, NT, ND, BS+ Musculoskeletal: no deformities, strength intact in all 4 Skin: moist, warm, no rashes Neurological: no tremor with outstretched hands, DTR normal in all 4  ASSESSMENT: 1. Papillary thyroid cancer  2. Postsurgical hypothyroidism  PLAN: 1. PTC - Pt with a small classical papillary thyroid cancer, 1.1 cm, unilateral.  There was no lymphovascular invasion, however, there has been extrathyroidal extension.  Because of this, her tumor was considered intermediate risk rather than low risk.  We decided to proceed with radioactive iodine treatment with Thyrogen.  The posttreatment whole body scan showed no metastasis. - We reviewed together the report of her latest neck ultrasound from 6 months ago and this showed no suspicious masses in the neck and no lymphadenopathy - Also, thyroglobulin at last visit was undetectable as were her antithyroglobulin antibodies.  We discussed that this is an excellent result. - At this visit, we will repeat her TFTs (target normal range, not lower) and also her thyroglobulin and antibodies - I will then see her back in 6 mo (her preference, rather than 1 year)  2. Postsurgical hypothyroidism - latest thyroid labs reviewed with pt >> TSH high >> Synthroid dose changed by PCP>> 88 mcg daily 5/7 days and 75 mcg 2/7 days. We discussed that this dose may not be enough >> will increase dose further  to 88 mcg daily. She had increased sweating in the past (>1 year ago) from this dose, but no increased sweating so far on the current LT4 dose). She still feels fatigued on the current LT4 dose.  - we discussed about taking the thyroid hormone every day, with water, >30 minutes before breakfast, separated by >4 hours from acid reflux medications, calcium, iron, multivitamins. Pt. is taking it correctly - will check thyroid tests in 1.5 mo: TSH and fT4 - RTC in 6 mo  Component     Latest Ref Rng &  Units 06/07/2017  Thyroglobulin     ng/mL 0.1 (L)  TSH     0.35 - 4.50 uIU/mL 3.34  T4,Free(Direct)     0.60 - 1.60 ng/dL 1.06  Thyroglobulin Ab     < or = 1 IU/mL <1   Thyroglobulin detectable, but very low, ATA negative.  TSH normal.  Philemon Kingdom, MD PhD [facility] Endocrinology

## 2017-04-26 NOTE — Patient Instructions (Signed)
Please change Levothyroxine to 88 mcg daily.  Take the thyroid hormone every day, with water, at least 30 minutes before breakfast, separated by at least 4 hours from: - acid reflux medications - calcium - iron - multivitamins  Please come back in 1.5 months for labs.  Please come back for a follow-up appointment in 6 months.

## 2017-05-21 ENCOUNTER — Other Ambulatory Visit: Payer: Self-pay | Admitting: Gynecology

## 2017-05-21 ENCOUNTER — Encounter: Payer: Self-pay | Admitting: Gynecology

## 2017-05-21 ENCOUNTER — Ambulatory Visit (INDEPENDENT_AMBULATORY_CARE_PROVIDER_SITE_OTHER): Payer: Medicare HMO

## 2017-05-21 DIAGNOSIS — Z780 Asymptomatic menopausal state: Secondary | ICD-10-CM

## 2017-05-21 DIAGNOSIS — Z01411 Encounter for gynecological examination (general) (routine) with abnormal findings: Secondary | ICD-10-CM

## 2017-05-24 DIAGNOSIS — E039 Hypothyroidism, unspecified: Secondary | ICD-10-CM | POA: Diagnosis not present

## 2017-05-24 DIAGNOSIS — I10 Essential (primary) hypertension: Secondary | ICD-10-CM | POA: Diagnosis not present

## 2017-05-24 DIAGNOSIS — E119 Type 2 diabetes mellitus without complications: Secondary | ICD-10-CM | POA: Diagnosis not present

## 2017-05-28 DIAGNOSIS — Z1231 Encounter for screening mammogram for malignant neoplasm of breast: Secondary | ICD-10-CM | POA: Diagnosis not present

## 2017-05-29 DIAGNOSIS — H01022 Squamous blepharitis right lower eyelid: Secondary | ICD-10-CM | POA: Diagnosis not present

## 2017-05-29 DIAGNOSIS — Z961 Presence of intraocular lens: Secondary | ICD-10-CM | POA: Diagnosis not present

## 2017-05-29 DIAGNOSIS — H01024 Squamous blepharitis left upper eyelid: Secondary | ICD-10-CM | POA: Diagnosis not present

## 2017-05-29 DIAGNOSIS — H10413 Chronic giant papillary conjunctivitis, bilateral: Secondary | ICD-10-CM | POA: Diagnosis not present

## 2017-05-29 DIAGNOSIS — H01021 Squamous blepharitis right upper eyelid: Secondary | ICD-10-CM | POA: Diagnosis not present

## 2017-05-29 DIAGNOSIS — H16223 Keratoconjunctivitis sicca, not specified as Sjogren's, bilateral: Secondary | ICD-10-CM | POA: Diagnosis not present

## 2017-05-29 DIAGNOSIS — H01025 Squamous blepharitis left lower eyelid: Secondary | ICD-10-CM | POA: Diagnosis not present

## 2017-06-03 DIAGNOSIS — R928 Other abnormal and inconclusive findings on diagnostic imaging of breast: Secondary | ICD-10-CM | POA: Diagnosis not present

## 2017-06-03 DIAGNOSIS — R922 Inconclusive mammogram: Secondary | ICD-10-CM | POA: Diagnosis not present

## 2017-06-07 ENCOUNTER — Other Ambulatory Visit (INDEPENDENT_AMBULATORY_CARE_PROVIDER_SITE_OTHER): Payer: Medicare HMO

## 2017-06-07 DIAGNOSIS — E890 Postprocedural hypothyroidism: Secondary | ICD-10-CM

## 2017-06-07 DIAGNOSIS — C73 Malignant neoplasm of thyroid gland: Secondary | ICD-10-CM

## 2017-06-07 LAB — T4, FREE: Free T4: 1.06 ng/dL (ref 0.60–1.60)

## 2017-06-07 LAB — TSH: TSH: 3.34 u[IU]/mL (ref 0.35–4.50)

## 2017-06-10 LAB — THYROGLOBULIN LEVEL: Thyroglobulin: 0.1 ng/mL — ABNORMAL LOW

## 2017-06-10 LAB — THYROGLOBULIN ANTIBODY: Thyroglobulin Ab: <1 IU/mL (ref <=1)

## 2017-08-13 DIAGNOSIS — H16041 Marginal corneal ulcer, right eye: Secondary | ICD-10-CM | POA: Diagnosis not present

## 2017-08-13 DIAGNOSIS — H16223 Keratoconjunctivitis sicca, not specified as Sjogren's, bilateral: Secondary | ICD-10-CM | POA: Diagnosis not present

## 2017-08-13 DIAGNOSIS — H15101 Unspecified episcleritis, right eye: Secondary | ICD-10-CM | POA: Diagnosis not present

## 2017-08-28 IMAGING — MR MR HEAD W/O CM
8 series · 48 of 48 positions shown · non-contrast
Comparison: None.

CLINICAL DATA: Pain in RIGHT ear, side of head, and face for 5
days. Dental procedure done 2 weeks ago.

EXAM:
MRI HEAD WITHOUT CONTRAST
TECHNIQUE: Multiplanar, multiecho pulse sequences of the brain and surrounding
structures were obtained without intravenous contrast.

[Series 2: T1 · sagittal · 5.0mm · 0.45mm/px · 2 of 21 slices shown]
[im 1/21]
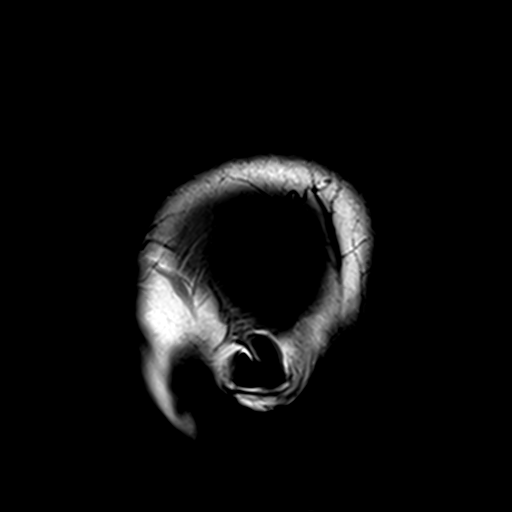
[im 21/21]
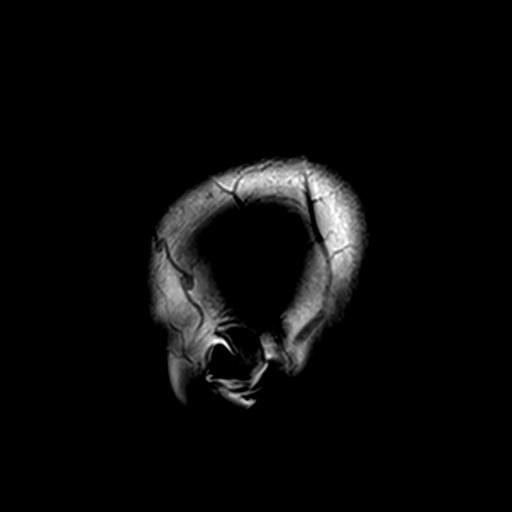

[Series 3: DWI · axial · 3.0mm · 1.80mm/px · z∈[-75,+71]mm · 10 of 100 slices shown (1 of 2)]
[im 1/100]
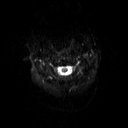
[im 12/100]
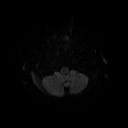
[im 23/100]
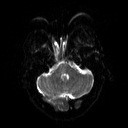
[im 34/100]
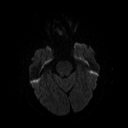
[im 45/100]
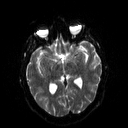
[im 56/100]
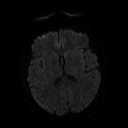
[im 67/100]
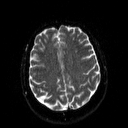
[im 78/100]
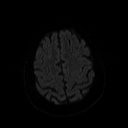
[im 89/100]
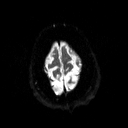
[im 100/100]
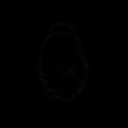

[Series 4: DWI · axial · 3.0mm · 1.80mm/px · z∈[-75,+71]mm · 5 of 49 slices shown (2 of 2)]
[im 1/49]
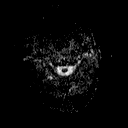
[im 13/49]
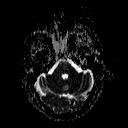
[im 25/49]
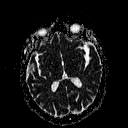
[im 37/49]
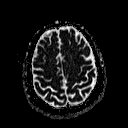
[im 49/49]
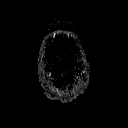

[Series 5: T2 · axial · 5.0mm · 0.51mm/px · z∈[-72,+75]mm · 2 of 22 slices shown (1 of 2)]
[im 1/22]
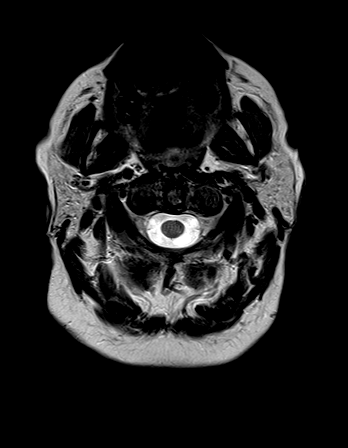
[im 22/22]
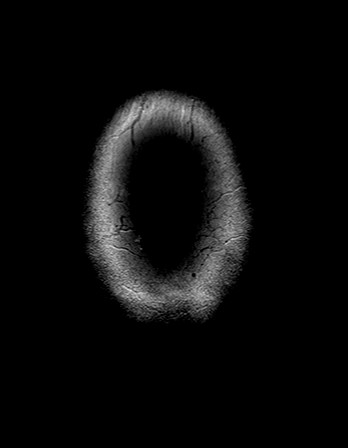

[Series 6: FLAIR · axial · 3.0mm · 0.45mm/px · z∈[-70,+67]mm · 5 of 47 slices shown]
[im 1/47]
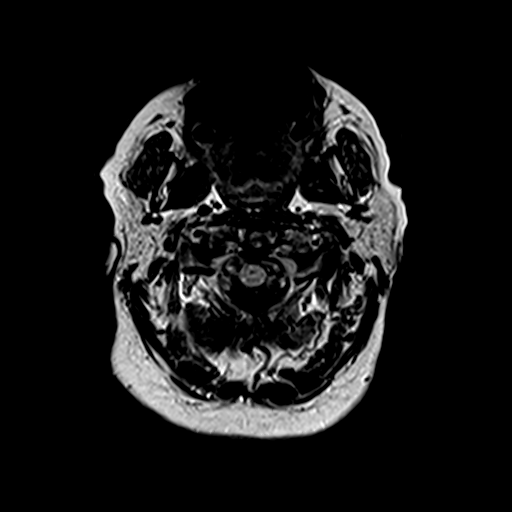
[im 12/47]
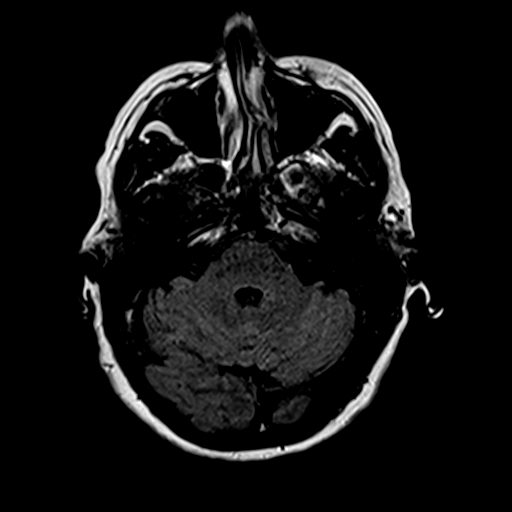
[im 24/47]
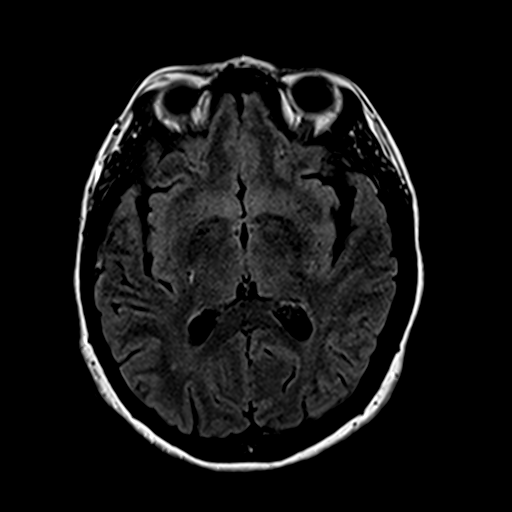
[im 35/47]
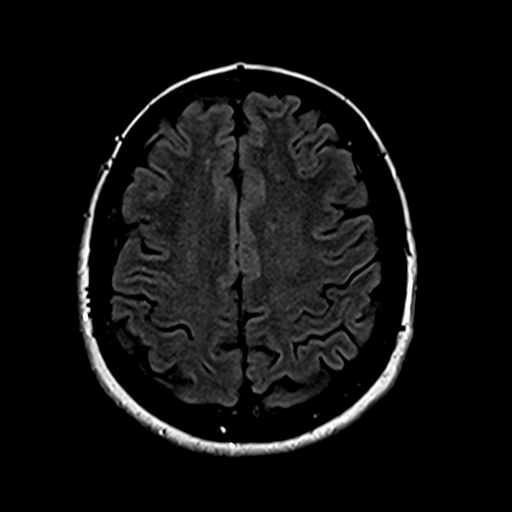
[im 47/47]
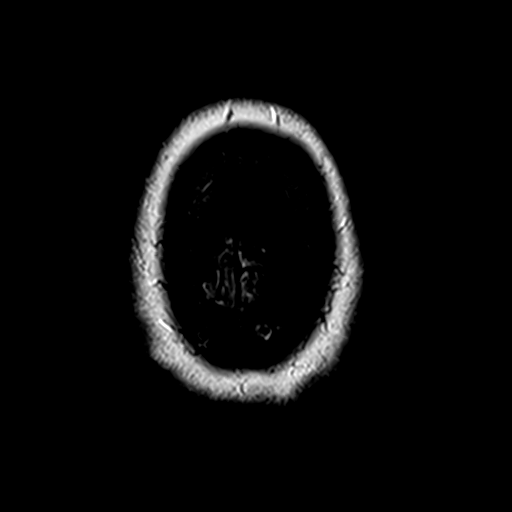

[Series 8: swi_images · axial · 2.0mm · 0.90mm/px · z∈[-79,+79]mm · 8 of 80 slices shown]
[im 1/80]
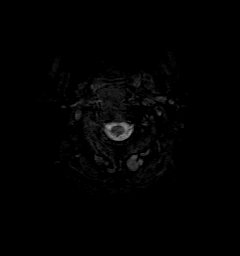
[im 12/80]
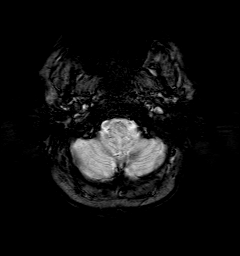
[im 23/80]
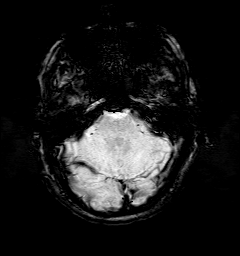
[im 34/80]
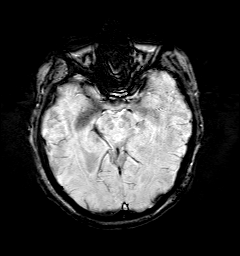
[im 46/80]
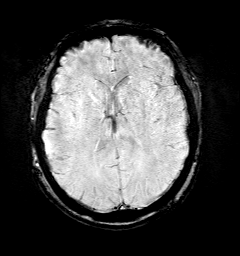
[im 57/80]
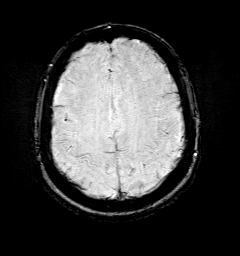
[im 68/80]
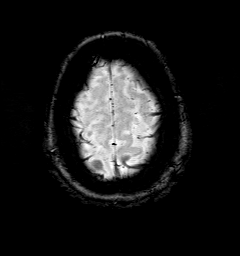
[im 80/80]
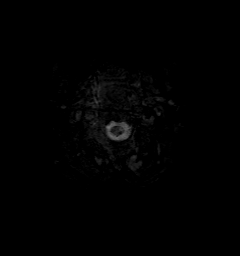

[Series 9: t1_mpr_tra · axial · 1.0mm · 0.72mm/px · z∈[-70,+73]mm · 14 of 144 slices shown]
[im 1/144]
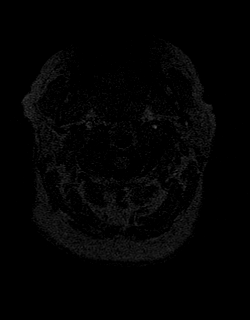
[im 12/144]
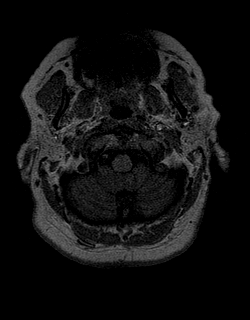
[im 23/144]
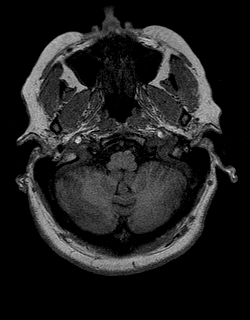
[im 34/144]
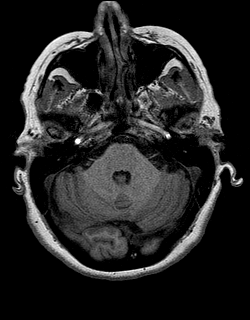
[im 45/144]
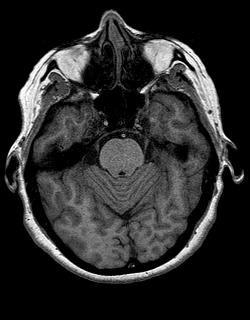
[im 56/144]
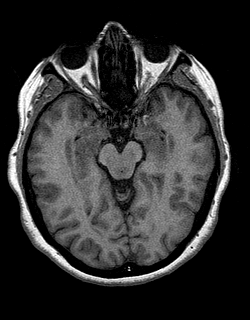
[im 67/144]
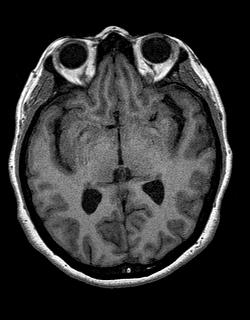
[im 78/144]
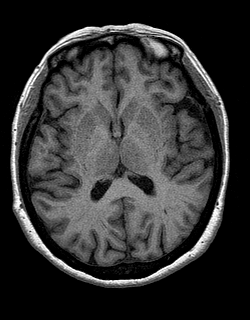
[im 89/144]
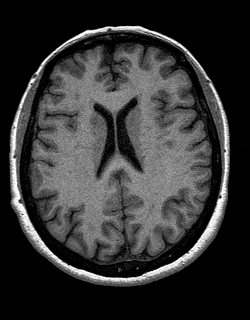
[im 100/144]
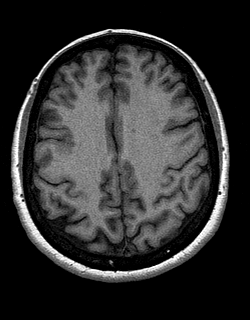
[im 111/144]
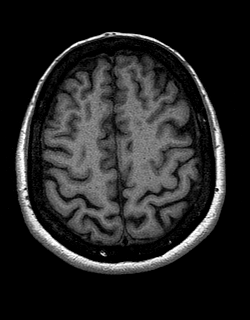
[im 122/144]
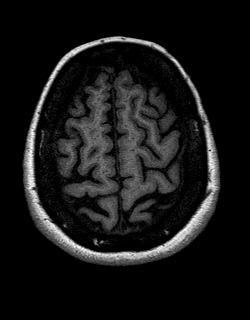
[im 133/144]
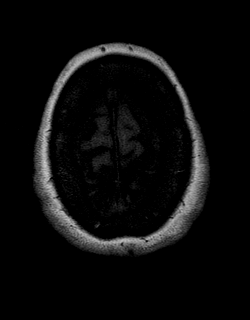
[im 144/144]
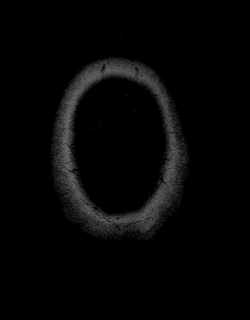

[Series 10: T2 · coronal · 5.0mm · 0.45mm/px · 2 of 25 slices shown (2 of 2)]
[im 1/25]
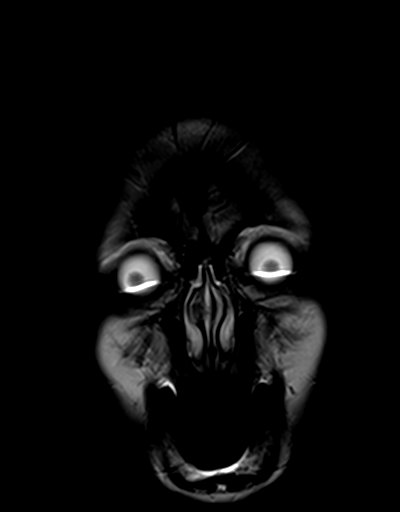
[im 25/25]
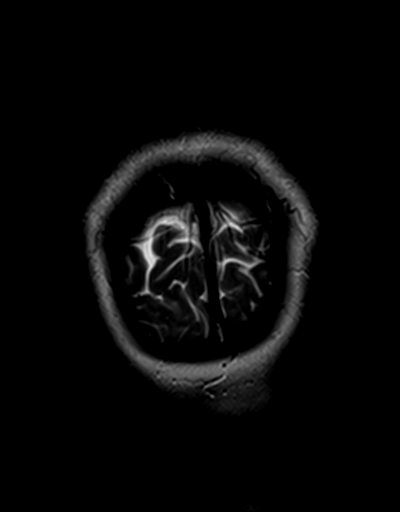

[48 of 48 positions shown; findings below may reference images not displayed]

FINDINGS: Brain: No evidence for acute infarction, hemorrhage, mass lesion,
hydrocephalus, or extra-axial fluid. Mild cerebral and cerebellar
atrophy. Mild subcortical and periventricular T2 and FLAIR
hyperintensities, likely chronic microvascular ischemic change.

Vascular: Normal flow voids.

Skull and upper cervical spine: Normal marrow signal.

Sinuses/Orbits: Negative.

Other: None.
IMPRESSION: Mild atrophy and small vessel disease. No acute intracranial
findings.

If concern for dental related cephalgia, CT of the neck with
contrast could be performed for further evaluation.

## 2017-09-16 ENCOUNTER — Other Ambulatory Visit: Payer: Self-pay | Admitting: Internal Medicine

## 2017-09-16 NOTE — Telephone Encounter (Signed)
Pt called about needing a new Rx for SYNTHROID 88 MCG tablet. Pt took her lat pill this morning.  Pharmacy is [id number] - [place], [place]  Call pt @ [phone]. Thank you!

## 2017-09-17 ENCOUNTER — Encounter: Payer: Self-pay | Admitting: Internal Medicine

## 2017-09-17 MED ORDER — SYNTHROID 88 MCG PO TABS: ORAL_TABLET | ORAL | 2 refills | Status: DC

## 2017-09-17 NOTE — Telephone Encounter (Signed)
Spoke to patient. Informed sent Rx as requested.

## 2017-09-23 DIAGNOSIS — E119 Type 2 diabetes mellitus without complications: Secondary | ICD-10-CM | POA: Diagnosis not present

## 2017-09-23 DIAGNOSIS — I10 Essential (primary) hypertension: Secondary | ICD-10-CM | POA: Diagnosis not present

## 2017-10-08 DIAGNOSIS — L600 Ingrowing nail: Secondary | ICD-10-CM | POA: Diagnosis not present

## 2017-10-08 DIAGNOSIS — L03032 Cellulitis of left toe: Secondary | ICD-10-CM | POA: Diagnosis not present

## 2017-10-22 DIAGNOSIS — L03032 Cellulitis of left toe: Secondary | ICD-10-CM | POA: Diagnosis not present

## 2017-10-22 DIAGNOSIS — M79675 Pain in left toe(s): Secondary | ICD-10-CM | POA: Diagnosis not present

## 2017-10-25 ENCOUNTER — Ambulatory Visit: Payer: Medicare HMO | Admitting: Internal Medicine

## 2017-10-25 ENCOUNTER — Encounter: Payer: Self-pay | Admitting: Internal Medicine

## 2017-10-25 VITALS — BP 110/70 | HR 66 | Ht 62.5 in | Wt 176.4 lb

## 2017-10-25 DIAGNOSIS — C73 Malignant neoplasm of thyroid gland: Secondary | ICD-10-CM

## 2017-10-25 DIAGNOSIS — E890 Postprocedural hypothyroidism: Secondary | ICD-10-CM | POA: Diagnosis not present

## 2017-10-25 LAB — T4, FREE: FREE T4: 1.05 ng/dL (ref 0.60–1.60)

## 2017-10-25 LAB — TSH: TSH: 0.96 u[IU]/mL (ref 0.35–4.50)

## 2017-10-25 NOTE — Patient Instructions (Addendum)
Please continue Synthroid 88 mcg daily. ° °Take the thyroid hormone every day, with water, at least 30 minutes before breakfast, separated by at least 4 hours from: °- acid reflux medications °- calcium °- iron °- multivitamins ° °Please stop at the lab. ° °Please come back for a follow-up appointment in 6 months. ° °

## 2017-10-25 NOTE — Progress Notes (Signed)
Patient ID: Teresa Molina, female   DOB: June 04, 1945, 72 y.o.   MRN: [id number]   HPI  Teresa Molina is a 72 y.o.-year-old female, initially referred by Teresa Molina for f/u for papillary thyroid cancer and postsurgical hypothyroidism. Last visit 6 months ago. PCP: Teresa Molina.  Reviewed and addended history: Patient has been found to have an incidental thyroid nodule during a recent carotid ultrasound, on 03/10/2015. The nodule was seen in the left lobe and measured 0.9 x 0.8 x 0.9 centimeters.  Thyroid U/S (05/23/2015): Left lower pole solid hypoechoic nodule measures 10 x 8 x 8 mm, nonspecific. There appears to be minor associated microcalcification.    L nodule was small, but hypoechoic and with possible small calcifications >> I suggested FNA.  Adequacy Reason Satisfactory For Evaluation. Diagnosis THYROID, LEFT LOBE INFERIOR, FINE NEEDLE ASPIRATION (SPECIMEN 1 OF 1, COLLECTED ON 06/07/2015): POSITIVE FOR PAPILLARY THYROID CARCINOMA (Teresa Molina). Teresa Niece MD Pathologist, Electronic Signature (Case signed 06/08/2015) Specimen Clinical Information Left lower pole solid hypoechoic nodule measures 10 x 8 x 8 mm, nonspecific, There appears to be minor associated microcalcification Source Thyroid, Fine Needle Aspiration, Left Lobe Inferior, (Specimen 1 of 1, collected on 06/07/15 )  Patient had total thyroidectomy by Teresa Molina on 07/21/2015. Final pathology showed: Diagnosis Thyroid, thyroidectomy, total - PAPILLARY CARCINOMA, CLASSIC VARIANT, SPANNING 1.1 CM. - EXTRATHYROIDAL EXTENSION PRESENT. - RESECTION MARGINS ARE NEGATIVE. - NO LYMPHOVASCULAR INVASION. - PARATHYROID TISSUE PRESENT. - SEE ONCOLOGY TABLE. Microscopic Comment THYROID Specimen: Total thyroid. Procedure (including lymph node sampling if applicable): Total thyroidectomy. Specimen Integrity (intact/fragmented): Intact. Tumor focality: Unifocal. Dominant tumor: Maximum tumor size (cm):  1.1 cm. Tumor laterality: Left. Histologic type (including subtype and/or unique features as applicable): Papillary carcinoma, classic variant. Tumor capsule: Partial. Extrathyroidal extension: Present. Capsular invasion with degree of invasion if present: N/A. Margins: Negative. Lymphatic or vascular invasion: Not identified. Lymph nodes: # examined 0; # positive; 0 TNM code: pT3, pNX Non-neoplastic thyroid: Nodular hyperplasia, benign parathyroid tissue.  10/19/2015: RAI tx 123 mCi 10/28/2015: post-tx WBS: No metastasis 10/25/2016: Neck U/S:  no residual thyroid tissue or adenopathy  At last check, Tg was very low, with undetectable ATA antibodies: Lab Results  Component Value Date   THYROGLB 0.1 (L) 06/07/2017   THYROGLB <0.1 (L) 10/25/2016   THYROGLB 0.3 (L) 11/07/2015   THGAB <1 06/07/2017   THGAB <1 10/25/2016   THGAB <1 11/07/2015   Pt is on Synthroid d.a.w. 88 mcg daily: - in am - fasting - at least 30 min from b'fast - no Ca, Fe, MVI, PPIs - + Occasional Pepcid in p.m. - not on Biotin  Reviewed patient's TFTs: Lab Results  Component Value Date   TSH 3.34 06/07/2017   TSH 2.29 10/25/2016   TSH 2.28 07/25/2016   TSH 1.64 06/01/2016   TSH 1.51 04/26/2016   TSH 0.74 02/23/2016   TSH 0.64 11/07/2015   TSH 0.98 08/29/2015   FREET4 1.06 06/07/2017   FREET4 0.87 10/25/2016   FREET4 1.02 07/25/2016   FREET4 0.92 06/01/2016   FREET4 1.13 04/26/2016   FREET4 0.80 02/23/2016   FREET4 1.18 11/07/2015   FREET4 1.28 08/29/2015  04/05/2017: TSH 7.87 01/02/2016: TSH 0.22. 03/07/2015: TSH 3.22, fT4 0.99   She had a Calcium of 7.4 postop >> started CaCO3 >>  calcium normalized:  Lab Results  Component Value Date   CALCIUM 8.8 11/07/2015   CALCIUM 7.4 (L) 07/22/2015   CALCIUM 9.4 07/13/2015   Pt denies: -  feeling nodules in neck - hoarseness - dysphagia - choking - SOB with lying down  No FH of thyroid ds. No FH of thyroid cancer. No FH of thyroid cancer.  No h/o radiation tx to head or neck other than RAI treatment.  No seaweed or kelp. No recent contrast studies. No herbal supplements. No Biotin use. No recent steroids use.   She has a history of HTN, HL, GERD, and sleep apnea.   She started vit D 3000 units daily.  HbA1c  before last visit: 5.9% >> started to work on diet to lose weight and improve her blood glu levels. Lost 6 lbs in last 6 mo, but she only started to work on diet n last 3 weeks.  ROS: Constitutional: no weight gain/no weight loss, no fatigue, no subjective hyperthermia, no subjective hypothermia Eyes: no blurry vision, no xerophthalmia ENT: no sore throat, + see HPI Cardiovascular: no CP/no SOB/no palpitations/no leg swelling Respiratory: no cough/no SOB/no wheezing Gastrointestinal: no N/no V/no D/no C/no acid reflux Musculoskeletal: no muscle aches/no joint aches Skin: no rashes, no hair loss Neurological: no tremors/no numbness/no tingling/no dizziness  I reviewed pt's medications, allergies, PMH, social hx, family hx, and changes were documented in the history of present illness. Otherwise, unchanged from my initial visit note.  Past Medical History:  Diagnosis Date  . Allergic conjunctivitis   . ALLERGIC RHINITIS   . Breast cyst    left breast - ultrasound benign 2010  . Cancer ([place])    Thyroid  . Esophageal reflux   . Food allergy    angioedema > strawberries  . GERD (gastroesophageal reflux disease)   . Hypertension   . Insomnia   . OSA on CPAP    NPSG 08-03-09: AHI 17.6; CPAP 12/ AHI 0; PLMA- mild does not wear cpap   . PONV (postoperative nausea and vomiting)    Past Surgical History:  Procedure Laterality Date  . BREAST SURGERY  2013   Rt breast mass excision  . CATARACT EXTRACTION, BILATERAL    . CHOLECYSTECTOMY  1991  . DILATION AND CURETTAGE OF UTERUS    . ROTATOR CUFF REPAIR  2006  . squamous cell excised    . THYROIDECTOMY N/A 07/21/2015   Procedure: TOTAL THYROIDECTOMY;  Surgeon: Teresa Gemma, MD;  Location: [facility];  Service: General;  Laterality: N/A;  . TONSILLECTOMY  1952 - approximate  . TOTAL ABDOMINAL HYSTERECTOMY  1978   Social History   Social History  . Marital Status: Divorced    Spouse Name: N/A  . Number of Children: 2   Occupational History  . 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 Smoking status: Never Smoker   . Smokeless tobacco: Never Used  . Alcohol Use: 0.0 oz/week    0 Standard drinks or equivalent per week     Comment: RARELY  . Drug Use: No   Current Outpatient Medications on File Prior to Visit  Medication Sig Dispense Refill  . Cholecalciferol (VITAMIN D3) 2000 units TABS Take 1 tablet by mouth daily.    Marland Kitchen ibuprofen (ADVIL,MOTRIN) 200 MG tablet Take 200 mg by mouth as needed for moderate pain. Reported on 05/11/2015    . labetalol (NORMODYNE) 200 MG tablet Take 1 tablet (200 mg total) by mouth 2 (two) times daily. 60 tablet 0  . SYNTHROID 88 MCG tablet Take 1 tab daily in am  90 tablet 2  . triamterene-hydrochlorothiazide (MAXZIDE-25) 37.5-25 MG per tablet Take 1 tablet by mouth daily. 30 tablet 0   No current facility-administered medications on file prior to visit.    Allergies  Allergen Reactions  . Amlodipine Swelling  . Diovan [Valsartan] Swelling  . Lipitor [Atorvastatin Calcium] Other (See Comments)    Caused muscle paralysis in legs  . Codeine Nausea And Vomiting  . Compazine [Prochlorperazine Edisylate] Other (See Comments)    Facial  muscle spasms   . Crestor [Rosuvastatin] Other (See Comments)    Severe joint pain.  . Reglan [Metoclopramide] Other (See Comments)    Facial muscle spasms  . Zoloft [Sertraline Hcl] Other (See Comments)    tremors  . Benadryl [Diphenhydramine Hcl] Palpitations  . Penicillins Other (See Comments)    Yeast infections   Family History  Problem Relation Age of Onset  . Other Father         brain tumor  . COPD Mother    PE: BP 110/70   Pulse 66   Ht 5' 2.5" (1.588 m)   Wt 176 lb 6.4 oz (80 kg)   SpO2 97%   BMI 31.75 kg/m  Body mass index is 31.75 kg/m. Wt Readings from Last 3 Encounters:  10/25/17 176 lb 6.4 oz (80 kg)  04/26/17 174 lb 6.4 oz (79.1 kg)  04/12/17 178 lb (80.7 kg)   Constitutional: overweight, in NAD Eyes: PERRLA, EOMI, no exophthalmos ENT: moist mucous membranes, no neck masses palpable, thyroidectomy scar healed, no cervical lymphadenopathy Cardiovascular: RRR, No MRG Respiratory: CTA B Gastrointestinal: abdomen soft, NT, ND, BS+ Musculoskeletal: no deformities, strength intact in all 4 Skin: moist, warm, no rashes Neurological: no tremor with outstretched hands, DTR normal in all 4  ASSESSMENT: 1. Papillary thyroid cancer  2. Postsurgical hypothyroidism  PLAN: 1. PTC -Patient with a small classical papillary thyroid cancer, 1.1 cm, unilateral.  There was no lymphovascular invasion, however, there has been extrathyroidal extension.  Because of this, her tumor was considered intermediate risk rather than low risk.  She had radioactive iodine treatment with Thyrogen.  No metastasis on the posttreatment whole body scan.  She had a neck ultrasound a year ago and this showed no suspicious masses in the neck and no lymphadenopathy - At last check, thyroglobulin was very low, 0.1 and her antithyroglobulin antibodies were negative. - We will repeat them today - I will see her back in 6 months, per her preference, rather than 1 year as I suggested  2. Postsurgical hypothyroidism - latest thyroid labs reviewed with pt >> normal in 05/2017 - she continues on LT4 88 mcg daily - pt feels good on this dose, without increased sweating, as she previously had on the higher dose in the past - we discussed about taking the thyroid hormone every day, with water, >30 minutes before breakfast, separated by >4 hours from acid reflux medications, calcium, iron,  multivitamins. Pt. is taking it correctly. - will check thyroid tests today: TSH and fT4 - If labs are abnormal, she will need to return for repeat TFTs in 1.5 months  Office Visit on 10/25/2017  Component Date Value Ref Range Status  . TSH 10/25/2017 0.96  0.35 - 4.50 uIU/mL Final  . Free T4 10/25/2017 1.05  0.60 - 1.60 ng/dL Final   Comment: Specimens from patients who are undergoing biotin therapy and /or ingesting biotin supplements may contain high levels of biotin.  The higher biotin concentration in these specimens interferes with this Free  T4 assay.  Specimens that contain high levels  of biotin may cause false high results for this Free T4 assay.  Please interpret results in light of the total clinical presentation of the patient.    . Thyroglobulin 10/25/2017 <0.1* ng/mL Final   Comment:       Reference Range:       Intact Thyroid   2.8-40.9       Athyrotic        <0.1 .       Note: Abnormal flagging is based       on the reference interval for        patients with intact thyroid. . . This test was performed using the Beck[employer] Coulter  chemiluminescent method. Values obtained from different assay methods cannot be used interchangeably. Thyroglobulin levels, regardless of value, should not be interpreted as absolute evidence of the presence or absence of disease. .   . Comment 10/25/2017    Final   Comment: . Thyroglobulin antibodies (TGAB) interfere with thyroglobulin (TG) assays; therefore, TGAB assay should always be performed in conjunction with a TG assay. .   . Thyroglobulin Ab 10/25/2017 <1  < or = 1 IU/mL Final   Excellent labs.  Philemon Kingdom, MD PhD [facility] Endocrinology

## 2017-10-28 LAB — THYROGLOBULIN LEVEL: Thyroglobulin: <0.1 ng/mL — ABNORMAL LOW

## 2017-10-28 LAB — THYROGLOBULIN ANTIBODY: Thyroglobulin Ab: <1 IU/mL (ref <=1)

## 2017-12-11 DIAGNOSIS — J0100 Acute maxillary sinusitis, unspecified: Secondary | ICD-10-CM | POA: Diagnosis not present

## 2017-12-11 DIAGNOSIS — R05 Cough: Secondary | ICD-10-CM | POA: Diagnosis not present

## 2017-12-11 DIAGNOSIS — J309 Allergic rhinitis, unspecified: Secondary | ICD-10-CM | POA: Diagnosis not present

## 2017-12-11 DIAGNOSIS — G4733 Obstructive sleep apnea (adult) (pediatric): Secondary | ICD-10-CM | POA: Diagnosis not present

## 2017-12-16 ENCOUNTER — Encounter: Payer: Self-pay | Admitting: Internal Medicine

## 2017-12-16 ENCOUNTER — Telehealth: Payer: Self-pay | Admitting: Internal Medicine

## 2017-12-16 ENCOUNTER — Ambulatory Visit: Payer: Medicare HMO | Admitting: Internal Medicine

## 2017-12-16 ENCOUNTER — Ambulatory Visit (INDEPENDENT_AMBULATORY_CARE_PROVIDER_SITE_OTHER)
Admission: RE | Admit: 2017-12-16 | Discharge: 2017-12-16 | Disposition: A | Discharge status: Home / Self Care | Payer: Medicare HMO | Source: Ambulatory Visit | Attending: Internal Medicine | Admitting: Internal Medicine

## 2017-12-16 VITALS — BP 138/96 | HR 79 | Temp 97.4°F | Ht 63.0 in | Wt 167.0 lb

## 2017-12-16 DIAGNOSIS — R0602 Shortness of breath: Secondary | ICD-10-CM | POA: Diagnosis not present

## 2017-12-16 DIAGNOSIS — F5101 Primary insomnia: Secondary | ICD-10-CM | POA: Diagnosis not present

## 2017-12-16 DIAGNOSIS — J209 Acute bronchitis, unspecified: Secondary | ICD-10-CM | POA: Diagnosis not present

## 2017-12-16 DIAGNOSIS — R05 Cough: Secondary | ICD-10-CM | POA: Diagnosis not present

## 2017-12-16 DIAGNOSIS — R69 Illness, unspecified: Secondary | ICD-10-CM | POA: Diagnosis not present

## 2017-12-16 MED ORDER — LEVALBUTEROL HCL 0.63 MG/3ML IN NEBU
0.6300 mg | INHALATION_SOLUTION | Freq: Once | RESPIRATORY_TRACT | Status: AC
  Administered 2017-12-16: 0.63 mg via RESPIRATORY_TRACT

## 2017-12-16 MED ORDER — METHYLPREDNISOLONE ACETATE 80 MG/ML IJ SUSP
80.0000 mg | Freq: Once | INTRAMUSCULAR | Status: AC
  Administered 2017-12-16: 80 mg via INTRAMUSCULAR

## 2017-12-16 MED ORDER — DOXYCYCLINE HYCLATE 100 MG PO TABS: ORAL_TABLET | ORAL | 0 refills | Status: DC

## 2017-12-16 MED ORDER — TRAMADOL HCL 50 MG PO TABS: 50.0000 mg | ORAL_TABLET | Freq: Four times a day (QID) | ORAL | 0 refills | Status: DC | PRN

## 2017-12-16 NOTE — Assessment & Plan Note (Signed)
Lingering acute URI/bronchitis syndrome, partially treated after Z-Pak.  Persistent chest tightness and tussive chest discomfort with fatigue and dry cough. Plan-doxycycline, Depo-Medrol, nebulizer treatment Xopenex, CXR, fluids and rest, tramadol for cough.

## 2017-12-16 NOTE — Progress Notes (Signed)
HPI HPI female never smoker followed for allergic rhinitis/conjunctivitis, insomnia, OSA/failed CPAP and Provent nasal valves, complicated by history of migraine, hypertension, GERD, thyroid cancer/ sgy/ RAI  --------------------------------------------------------------------------------------------------  09/06/2015-72 year old female never smoker followed for allergic rhinitis/conjunctivitis, insomnia, OSA/failed CPAP and Provent nasal valves, complicated by history of migraine, hypertension, GERD, thyroid cancer FOLLOWS LKG:[id number] ca surg.(07-21-15).Not using CPAP.Sleep hard to assess-due to adjusting since sx She has never slept well, never comfortable with CPAP, never sought oral appliance or cognitive behavioral therapy. Now additional stress related to cancer diagnosis and changing thyroid hormone status. We talked about basic sleep hygiene, body weight, ways to stabilize nighttime sleep and daytime alertness. Has been mostly indoors and not badly affected by Spring pollens so far.  12/16/2017- 72 year old female never smoker followed for allergic rhinitis/conjunctivitis, insomnia, OSA/failed CPAP and Provent nasal valves, complicated by history of migraine, hypertension, GERD, thyroid cancer/ sgy/ RAI -----Pt is having cough-unable to produce any phelgm and SOB. Pt has had night sweats. Denies any fever or chills or wheezing.  Acute visit.  Was at a conference a week and a half ago.  Developed laryngitis and progressive dry cough with chest tightness.  Seen at her primary care office for Z-Pak and OTC cough syrup which did not help enough.  Told her ears look infected then.  Now having some sense of sore throat and ear discomfort when she swallows.  GI okay.  Some sweats without fever.  Review of Systems-see HPI + = positive Constitutional:   +  weight loss-diet, night sweats, fevers, chills, fatigue, lassitude. + insomnia HEENT:   +headaches, difficulty swallowing, tooth/dental problems,  +sore throat,       No- sneezing, itching, no-ears itch,+nasal congestion, post nasal drip,  CV:  No-   chest pain, orthopnea, PND, swelling in lower extremities, anasarca, dizziness, palpitations Resp: No-   shortness of breath with exertion or at rest.           productive cough, +non-productive cough,  No-  coughing up of blood.              No-   change in color of mucus.  No- wheezing.   Skin: No-   rash or lesions. GI:  No-   heartburn, indigestion, abdominal pain, nausea, vomiting,  GU: MS:  No-   joint pain or swelling.   Neuro- :  Psych:  No- change in mood or affect. Some- depression or anxiety.  No memory loss.   Physical Exam  General- Alert, Oriented, Affect-appropriate, very pleasant, + does not feel well Skin- rash-none, lesions- none, excoriation- none Lymphadenopathy- none Head- atraumatic            Eyes- Gross vision intact, PERRLA, conjunctivae clear secretions            Ears- Canals+ a little red, TMs ok, + cerumen            Nose- clear , No- sores,  no -Septal dev, mucus, polyps, erosion, perforation.             Throat- Mallampati III , mucosa clear-not red , drainage- none, tonsils- atrophic ,  Neck- flexible , trachea midline, no stridor , thyroid + incision scar barely visible, carotid no bruit Chest - symmetrical excursion , unlabored           Heart/CV- RRR , no murmur , no gallop  , no rub, nl s1 s2                           -  JVD- none , edema- none, stasis changes- none, varices- none           Lung- clear, wheeze- none, cough + dry, dullness-none, rub- none           Chest wall-  Abd- Br/ Gen/ Rectal- Not done, not indicated Extrem- cyanosis- none, clubbing, none, atrophy- none, strength- nl Neuro- grossly intact to observation

## 2017-12-16 NOTE — Assessment & Plan Note (Signed)
She reports this is an ongoing problem.  We can discuss on follow-up.

## 2017-12-16 NOTE — Addendum Note (Signed)
Addended by: Nena Polio on: 12/16/2017 03:36 PM   Modules accepted: Orders

## 2017-12-16 NOTE — Telephone Encounter (Signed)
Discussed with Joellen Jersey who had already spoken with CY Per Joellen Jersey, okay to add on "where is convenient for the patient"  Called spoke with patient - appt scheduled for 12/16/17 @ 1100  Nothing further needed; will sign off

## 2017-12-16 NOTE — Patient Instructions (Addendum)
Script sent for doxycycline antibiotic  Script printed for tramadol for cough if needed  Order- neb xop 0.63     Dx acute bronchitis  Order- CXR    Dx Acute bronchitis,    Please call as needed

## 2017-12-23 ENCOUNTER — Telehealth: Payer: Self-pay | Admitting: Internal Medicine

## 2017-12-23 NOTE — Telephone Encounter (Signed)
Notes recorded by Lorane Gell, CMA on 12/23/2017 at 9:12 AM EDT LMTCB ------  Notes recorded by Deneise Lever, MD on 12/16/2017 at 12:57 PM EDT CXR- lungs are clear- no pneumonia or other active process. Ok to continue treating as bronchitis as we discussed in office.  Called and spoke with patient gave her the results and recommendation per CY nothing further needed at this time.

## 2018-01-17 DIAGNOSIS — D1801 Hemangioma of skin and subcutaneous tissue: Secondary | ICD-10-CM | POA: Diagnosis not present

## 2018-01-17 DIAGNOSIS — I788 Other diseases of capillaries: Secondary | ICD-10-CM | POA: Diagnosis not present

## 2018-01-17 DIAGNOSIS — L298 Other pruritus: Secondary | ICD-10-CM | POA: Diagnosis not present

## 2018-02-25 DIAGNOSIS — E039 Hypothyroidism, unspecified: Secondary | ICD-10-CM | POA: Diagnosis not present

## 2018-02-25 DIAGNOSIS — L853 Xerosis cutis: Secondary | ICD-10-CM | POA: Diagnosis not present

## 2018-02-25 DIAGNOSIS — B354 Tinea corporis: Secondary | ICD-10-CM | POA: Diagnosis not present

## 2018-02-25 DIAGNOSIS — L299 Pruritus, unspecified: Secondary | ICD-10-CM | POA: Diagnosis not present

## 2018-03-04 DIAGNOSIS — Z23 Encounter for immunization: Secondary | ICD-10-CM | POA: Diagnosis not present

## 2018-03-24 DIAGNOSIS — H16213 Exposure keratoconjunctivitis, bilateral: Secondary | ICD-10-CM | POA: Diagnosis not present

## 2018-03-24 DIAGNOSIS — Z961 Presence of intraocular lens: Secondary | ICD-10-CM | POA: Diagnosis not present

## 2018-03-24 DIAGNOSIS — H15101 Unspecified episcleritis, right eye: Secondary | ICD-10-CM | POA: Diagnosis not present

## 2018-03-24 DIAGNOSIS — H1131 Conjunctival hemorrhage, right eye: Secondary | ICD-10-CM | POA: Diagnosis not present

## 2018-03-28 DIAGNOSIS — I788 Other diseases of capillaries: Secondary | ICD-10-CM | POA: Diagnosis not present

## 2018-03-28 DIAGNOSIS — D2271 Melanocytic nevi of right lower limb, including hip: Secondary | ICD-10-CM | POA: Diagnosis not present

## 2018-03-28 DIAGNOSIS — D2272 Melanocytic nevi of left lower limb, including hip: Secondary | ICD-10-CM | POA: Diagnosis not present

## 2018-03-28 DIAGNOSIS — D2262 Melanocytic nevi of left upper limb, including shoulder: Secondary | ICD-10-CM | POA: Diagnosis not present

## 2018-03-28 DIAGNOSIS — D1801 Hemangioma of skin and subcutaneous tissue: Secondary | ICD-10-CM | POA: Diagnosis not present

## 2018-03-28 DIAGNOSIS — Z85828 Personal history of other malignant neoplasm of skin: Secondary | ICD-10-CM | POA: Diagnosis not present

## 2018-03-28 DIAGNOSIS — D225 Melanocytic nevi of trunk: Secondary | ICD-10-CM | POA: Diagnosis not present

## 2018-03-28 DIAGNOSIS — L814 Other melanin hyperpigmentation: Secondary | ICD-10-CM | POA: Diagnosis not present

## 2018-03-28 DIAGNOSIS — L821 Other seborrheic keratosis: Secondary | ICD-10-CM | POA: Diagnosis not present

## 2018-03-28 DIAGNOSIS — D2261 Melanocytic nevi of right upper limb, including shoulder: Secondary | ICD-10-CM | POA: Diagnosis not present

## 2018-04-22 ENCOUNTER — Encounter: Payer: Medicare HMO | Admitting: Gynecology

## 2018-04-23 ENCOUNTER — Encounter: Payer: Self-pay | Admitting: Internal Medicine

## 2018-04-23 ENCOUNTER — Ambulatory Visit (INDEPENDENT_AMBULATORY_CARE_PROVIDER_SITE_OTHER): Payer: Medicare HMO | Admitting: Internal Medicine

## 2018-04-23 DIAGNOSIS — F5101 Primary insomnia: Secondary | ICD-10-CM | POA: Diagnosis not present

## 2018-04-23 DIAGNOSIS — R69 Illness, unspecified: Secondary | ICD-10-CM | POA: Diagnosis not present

## 2018-04-23 DIAGNOSIS — G4733 Obstructive sleep apnea (adult) (pediatric): Secondary | ICD-10-CM

## 2018-04-23 NOTE — Patient Instructions (Signed)
Please call after Christmas to set up shingles vaccine  Please cal if I can help

## 2018-04-23 NOTE — Progress Notes (Signed)
HPI HPI female never smoker followed for allergic rhinitis/conjunctivitis, insomnia, OSA/failed CPAP and Provent nasal valves, complicated by history of migraine, hypertension, GERD, thyroid cancer/ sgy/ RAI  -------------------------------------------------------------------------------------------------- 12/16/2017- 72 year old female never smoker followed for allergic rhinitis/conjunctivitis, insomnia, OSA/failed CPAP and Provent nasal valves, complicated by history of migraine, hypertension, GERD, thyroid cancer/ sgy/ RAI -----Pt is having cough-unable to produce any phelgm and SOB. Pt has had night sweats. Denies any fever or chills or wheezing.  Acute visit.  Was at a conference a week and a half ago.  Developed laryngitis and progressive dry cough with chest tightness.  Seen at her primary care office for Z-Pak and OTC cough syrup which did not help enough.  Told her ears look infected then.  Now having some sense of sore throat and ear discomfort when she swallows.  GI okay.  Some sweats without fever.  04/23/2018-  72 year old female never smoker followed for allergic rhinitis/conjunctivitis, Insomnia, OSA/failed CPAP and Provent nasal valves, complicated by history of migraine, hypertension, GERD, thyroid cancer/ sgy/ RAI OSA: Pt does not wear CPAP. Pt states she is having slight chest tightness and itchy feelings.  Insomnia remains a long-term problem.  She copes with it but has admitted suboptimal sleep habits.  She is not interested in trying more sleep medicines after failing with several.  She continues a high stress job with her local [employer]. Describes frustration with her thyroid replacement status.  She thinks she would feel better if dose were increased but continues to follow with endocrinology. Had sustained itching in scalp and trunk for which she saw dermatology with no findings.  A cortisone shot may have helped this summer, but there is been no rash and no exposure  pattern recognized. CXR  12/16/17 IMPRESSION: No active cardiopulmonary disease.  Review of Systems-see HPI + = positive Constitutional:   +  weight loss-diet, night sweats, fevers, chills, fatigue, lassitude. + insomnia HEENT:   +headaches, difficulty swallowing, tooth/dental problems, +sore throat,       No- sneezing, itching, no-ears itch,+nasal congestion, post nasal drip,  CV:  No-   chest pain, orthopnea, PND, swelling in lower extremities, anasarca, dizziness, palpitations Resp: No-   shortness of breath with exertion or at rest.           productive cough, +non-productive cough,  No-  coughing up of blood.              No-   change in color of mucus.  No- wheezing.   Skin: No-   rash or lesions. GI:  No-   heartburn, indigestion, abdominal pain, nausea, vomiting,  GU: MS:  No-   joint pain or swelling.   Neuro- :  Psych:  No- change in mood or affect. Some- depression or anxiety.  No memory loss.   Physical Exam  General- Alert, Oriented, Affect-appropriate, very pleasant,  Skin- rash-none, lesions- none, excoriation- none Lymphadenopathy- none Head- atraumatic            Eyes- Gross vision intact, PERRLA, conjunctivae clear secretions            Ears- Canals-, TMs ok,             Nose- clear , No- sores,  no -Septal dev, mucus, polyps, erosion, perforation.             Throat- Mallampati III , mucosa clear-not red , drainage- none, tonsils- atrophic ,  Neck- flexible , trachea midline, no stridor , thyroid + incision scar  barely visible, carotid no bruit Chest - symmetrical excursion , unlabored           Heart/CV- RRR , no murmur , no gallop  , no rub, nl s1 s2                           - JVD- none , edema- none, stasis changes- none, varices- none           Lung- clear, wheeze- none, cough- none, dullness-none, rub- none           Chest wall-  Abd- Br/ Gen/ Rectal- Not done, not indicated Extrem- cyanosis- none, clubbing, none, atrophy- none, strength- nl Neuro- grossly  intact to observation

## 2018-04-23 NOTE — Assessment & Plan Note (Addendum)
Chronic problem including components of job stress and probably irregular sleep schedule.  She does not want more medication at this time. I have offered cognitive behavioral therapy referral in the past.

## 2018-04-23 NOTE — Assessment & Plan Note (Signed)
Continues with conservative management, sleeping off back and avoiding weight gain

## 2018-04-24 ENCOUNTER — Ambulatory Visit: Payer: Medicare HMO | Admitting: Gynecology

## 2018-04-24 ENCOUNTER — Encounter: Payer: Self-pay | Admitting: Gynecology

## 2018-04-24 VITALS — BP 126/74 | Ht 63.0 in | Wt 177.0 lb

## 2018-04-24 DIAGNOSIS — Z9189 Other specified personal risk factors, not elsewhere classified: Secondary | ICD-10-CM | POA: Diagnosis not present

## 2018-04-24 DIAGNOSIS — Z01419 Encounter for gynecological examination (general) (routine) without abnormal findings: Secondary | ICD-10-CM | POA: Diagnosis not present

## 2018-04-24 DIAGNOSIS — N952 Postmenopausal atrophic vaginitis: Secondary | ICD-10-CM

## 2018-04-24 DIAGNOSIS — R69 Illness, unspecified: Secondary | ICD-10-CM | POA: Diagnosis not present

## 2018-04-24 DIAGNOSIS — Z7251 High risk heterosexual behavior: Secondary | ICD-10-CM

## 2018-04-24 NOTE — Progress Notes (Signed)
    Teresa Molina 1945/12/28 [id number]        71 y.o.  [id number] for breast and pelvic exam.  Doing well without gynecologic complaints  Past medical history,surgical history, problem list, medications, allergies, family history and social history were all reviewed and documented as reviewed in the EPIC chart.  ROS:  Performed with pertinent positives and negatives included in the history, assessment and plan.   Additional significant findings : None   Exam: Caryn Bee assistant Vitals:   04/24/18 0925  BP: 126/74  Weight: 177 lb (80.3 kg)  Height: 5\' 3"  (1.6 m)   Body mass index is 31.35 kg/m.  General appearance:  Normal affect, orientation and appearance. Skin: Grossly normal HEENT: Without gross lesions.  No cervical or supraclavicular adenopathy. Thyroid normal.  Lungs:  Clear without wheezing, rales or rhonchi Cardiac: RR, without RMG Abdominal:  Soft, nontender, without masses, guarding, rebound, organomegaly or hernia Breasts:  Examined lying and sitting without masses, retractions, discharge or axillary adenopathy. Pelvic:  Ext, BUS, Vagina: With atrophic changes  Adnexa: Without masses or tenderness    Anus and perineum: Normal   Rectovaginal: Normal sphincter tone without palpated masses or tenderness.    Assessment/Plan:  72 y.o. [id number] female for breast and pelvic exam.   1. Postmenopausal.  Status post hysterectomy in the past.  No menopausal symptoms. 2. Pap smear 2018.  No Pap smear done today.  No history of abnormal Pap smears.  Options to stop screening per current screening guidelines based on age and hysterectomy history reviewed.  Will readdress on an annual basis. 3. Mammography due now and patient will follow-up for this.  Breast exam normal today. 4. DEXA 2019 normal.  Recommend follow-up DEXA at 5-year interval. 5. Colonoscopy 2014.  Repeat at their recommended interval. 6. Health maintenance.  No routine lab work done as patient does this  elsewhere.  Follow-up 1 year, sooner as needed.   [name] MD, 9:54 AM 04/24/2018

## 2018-04-24 NOTE — Patient Instructions (Signed)
Follow-up in 1 year for annual exam, sooner as needed. 

## 2018-04-25 ENCOUNTER — Ambulatory Visit: Payer: Medicare HMO | Admitting: Internal Medicine

## 2018-05-22 DIAGNOSIS — Z86010 Personal history of colonic polyps: Secondary | ICD-10-CM | POA: Diagnosis not present

## 2018-05-22 DIAGNOSIS — R1031 Right lower quadrant pain: Secondary | ICD-10-CM | POA: Diagnosis not present

## 2018-05-22 DIAGNOSIS — R1310 Dysphagia, unspecified: Secondary | ICD-10-CM | POA: Diagnosis not present

## 2018-05-22 DIAGNOSIS — R1032 Left lower quadrant pain: Secondary | ICD-10-CM | POA: Diagnosis not present

## 2018-05-22 DIAGNOSIS — K625 Hemorrhage of anus and rectum: Secondary | ICD-10-CM | POA: Diagnosis not present

## 2018-06-03 DIAGNOSIS — Z1231 Encounter for screening mammogram for malignant neoplasm of breast: Secondary | ICD-10-CM | POA: Diagnosis not present

## 2018-06-06 ENCOUNTER — Other Ambulatory Visit: Payer: Self-pay | Admitting: Internal Medicine

## 2018-06-23 ENCOUNTER — Encounter: Payer: Self-pay | Admitting: Internal Medicine

## 2018-06-23 DIAGNOSIS — K2930 Chronic superficial gastritis without bleeding: Secondary | ICD-10-CM | POA: Diagnosis not present

## 2018-06-23 DIAGNOSIS — Z86010 Personal history of colonic polyps: Secondary | ICD-10-CM | POA: Diagnosis not present

## 2018-06-23 DIAGNOSIS — K2960 Other gastritis without bleeding: Secondary | ICD-10-CM | POA: Diagnosis not present

## 2018-06-23 DIAGNOSIS — R1310 Dysphagia, unspecified: Secondary | ICD-10-CM | POA: Diagnosis not present

## 2018-06-23 DIAGNOSIS — K640 First degree hemorrhoids: Secondary | ICD-10-CM | POA: Diagnosis not present

## 2018-06-27 DIAGNOSIS — K2930 Chronic superficial gastritis without bleeding: Secondary | ICD-10-CM | POA: Diagnosis not present

## 2018-06-27 DIAGNOSIS — K2960 Other gastritis without bleeding: Secondary | ICD-10-CM | POA: Diagnosis not present

## 2018-06-30 DIAGNOSIS — E7800 Pure hypercholesterolemia, unspecified: Secondary | ICD-10-CM | POA: Diagnosis not present

## 2018-06-30 DIAGNOSIS — I10 Essential (primary) hypertension: Secondary | ICD-10-CM | POA: Diagnosis not present

## 2018-06-30 DIAGNOSIS — M25562 Pain in left knee: Secondary | ICD-10-CM | POA: Diagnosis not present

## 2018-07-03 DIAGNOSIS — E039 Hypothyroidism, unspecified: Secondary | ICD-10-CM | POA: Diagnosis not present

## 2018-07-03 DIAGNOSIS — E119 Type 2 diabetes mellitus without complications: Secondary | ICD-10-CM | POA: Diagnosis not present

## 2018-07-03 DIAGNOSIS — D696 Thrombocytopenia, unspecified: Secondary | ICD-10-CM | POA: Diagnosis not present

## 2018-07-03 DIAGNOSIS — K219 Gastro-esophageal reflux disease without esophagitis: Secondary | ICD-10-CM | POA: Diagnosis not present

## 2018-07-03 DIAGNOSIS — Z0000 Encounter for general adult medical examination without abnormal findings: Secondary | ICD-10-CM | POA: Diagnosis not present

## 2018-07-03 DIAGNOSIS — I10 Essential (primary) hypertension: Secondary | ICD-10-CM | POA: Diagnosis not present

## 2018-07-03 DIAGNOSIS — L299 Pruritus, unspecified: Secondary | ICD-10-CM | POA: Diagnosis not present

## 2018-07-03 DIAGNOSIS — R69 Illness, unspecified: Secondary | ICD-10-CM | POA: Diagnosis not present

## 2018-07-22 ENCOUNTER — Ambulatory Visit: Payer: Medicare HMO | Admitting: Internal Medicine

## 2018-07-22 ENCOUNTER — Encounter: Payer: Self-pay | Admitting: Internal Medicine

## 2018-07-22 VITALS — BP 116/70 | HR 50 | Ht 62.5 in | Wt 177.4 lb

## 2018-07-22 DIAGNOSIS — G4733 Obstructive sleep apnea (adult) (pediatric): Secondary | ICD-10-CM

## 2018-07-22 DIAGNOSIS — R69 Illness, unspecified: Secondary | ICD-10-CM | POA: Diagnosis not present

## 2018-07-22 DIAGNOSIS — F5101 Primary insomnia: Secondary | ICD-10-CM

## 2018-07-22 DIAGNOSIS — K219 Gastro-esophageal reflux disease without esophagitis: Secondary | ICD-10-CM

## 2018-07-22 MED ORDER — SUVOREXANT 15 MG PO TABS: 15.0000 mg | ORAL_TABLET | Freq: Every day | ORAL | 5 refills | Status: DC

## 2018-07-22 NOTE — Assessment & Plan Note (Signed)
She sleeping off back and avoiding weight gain.

## 2018-07-22 NOTE — Patient Instructions (Addendum)
Script sent for Belsomra for sleep  Please call as needed

## 2018-07-22 NOTE — Assessment & Plan Note (Signed)
Recent EGD and Colon by [facility].

## 2018-07-22 NOTE — Assessment & Plan Note (Signed)
Long-standing problem primarily with sleep maintenance. Stress contributes. CBT previously offered. Plan- try Belsomra 15 mg. I showed her side-effects listed in [facility].

## 2018-07-22 NOTE — Progress Notes (Signed)
HPI HPI female never smoker followed for allergic rhinitis/conjunctivitis, insomnia, OSA/failed CPAP and Provent nasal valves, complicated by history of migraine, hypertension, GERD, thyroid cancer/ sgy/ RAI  -------------------------------------------------------------------------------------------------- 04/23/2018-  73 year old female never smoker followed for allergic rhinitis/conjunctivitis, Insomnia, OSA/failed CPAP and Provent nasal valves, complicated by history of migraine, hypertension, GERD, thyroid cancer/ sgy/ RAI OSA: Pt does not wear CPAP. Pt states she is having slight chest tightness and itchy feelings.  Insomnia remains a long-term problem.  She copes with it but has admitted suboptimal sleep habits.  She is not interested in trying more sleep medicines after failing with several.  She continues a high stress job with her local [employer]. Describes frustration with her thyroid replacement status.  She thinks she would feel better if dose were increased but continues to follow with endocrinology. Had sustained itching in scalp and trunk for which she saw dermatology with no findings.  A cortisone shot may have helped this summer, but there is been no rash and no exposure pattern recognized. CXR  12/16/17 IMPRESSION: No active cardiopulmonary disease.  07/21/2018- 73 year old female never smoker followed for allergic rhinitis/conjunctivitis, Insomnia, OSA/failed CPAP and Provent nasal valves, complicated by history of migraine, hypertension, GERD, thyroid cancer/ sgy/ RAI -----breathing's been fine, having trouble sleeping Describes problem mainly maintaining sleep at night.  Can fall asleep okay.  I suggested we try Belsomra again.  Job stresses get worse around the time since she works for [employer]. Recent evaluation by [facility] has shown evidence of previous gastritis.  Now 3 years after thyroid cancer.  She is not entirely comfortable with thyroid hormone  status but is followed by endocrinology.  Review of Systems-see HPI + = positive Constitutional:     weight loss-diet, night sweats, fevers, chills, fatigue, lassitude. + insomnia HEENT:   +headaches, difficulty swallowing, tooth/dental problems, +sore throat,       No- sneezing, itching, no-ears itch,+nasal congestion, post nasal drip,  CV:  No-   chest pain, orthopnea, PND, swelling in lower extremities, anasarca, dizziness, palpitations Resp: No-   shortness of breath with exertion or at rest.           productive cough, +non-productive cough,  No-  coughing up of blood.              No-   change in color of mucus.  No- wheezing.   Skin: No-   rash or lesions. GI:  No-   heartburn, indigestion, abdominal pain, nausea, vomiting,  GU: MS:  No-   joint pain or swelling.   Neuro- :  Psych:  No- change in mood or affect. Some- depression or anxiety.  No memory loss.   Physical Exam  General- Alert, Oriented, Affect-appropriate, very pleasant,  Skin- rash-none, lesions- none, excoriation- none Lymphadenopathy- none Head- atraumatic            Eyes- Gross vision intact, PERRLA, conjunctivae clear secretions            Ears- Canals-, TMs ok,             Nose- clear , No- sores,  no -Septal dev, mucus, polyps, erosion, perforation.             Throat- Mallampati III , mucosa clear-not red , drainage- none, tonsils- atrophic ,  Neck- flexible , trachea midline, no stridor , thyroid + incision scar barely visible, carotid no bruit Chest - symmetrical excursion , unlabored  Heart/CV- RRR , no murmur , no gallop  , no rub, nl s1 s2                           - JVD- none , edema- none, stasis changes- none, varices- none           Lung- clear, wheeze- none, cough- none, dullness-none, rub- none           Chest wall-  Abd- Br/ Gen/ Rectal- Not done, not indicated Extrem- cyanosis- none, clubbing, none, atrophy- none, strength- nl Neuro- grossly intact to observation

## 2018-07-23 ENCOUNTER — Ambulatory Visit: Payer: Medicare HMO | Admitting: Internal Medicine

## 2018-07-23 ENCOUNTER — Encounter: Payer: Self-pay | Admitting: Internal Medicine

## 2018-07-23 ENCOUNTER — Other Ambulatory Visit: Payer: Self-pay

## 2018-07-23 ENCOUNTER — Encounter

## 2018-07-23 VITALS — BP 140/70 | HR 66 | Ht 62.5 in | Wt 178.0 lb

## 2018-07-23 DIAGNOSIS — C73 Malignant neoplasm of thyroid gland: Secondary | ICD-10-CM | POA: Diagnosis not present

## 2018-07-23 DIAGNOSIS — E890 Postprocedural hypothyroidism: Secondary | ICD-10-CM

## 2018-07-23 LAB — TSH: TSH: 0.37 u[IU]/mL (ref 0.35–4.50)

## 2018-07-23 LAB — VITAMIN D 25 HYDROXY (VIT D DEFICIENCY, FRACTURES): VITD: 32.69 ng/mL (ref 30.00–100.00)

## 2018-07-23 LAB — T4, FREE: Free T4: 1.19 ng/dL (ref 0.60–1.60)

## 2018-07-23 NOTE — Progress Notes (Signed)
Patient ID: Teresa Molina, female   DOB: [birth year], 73 y.o.   MRN: [id number]   HPI  Teresa Molina is a 73 y.o.-year-old female, initially referred by Dr. Rush Farmer for f/u for papillary thyroid cancer and postsurgical hypothyroidism and hypocalcemia. Last visit 9 months ago. PCP: Dr. Shelia Media.  Reviewed history: Patient has been found to have an incidental thyroid nodule during a recent carotid ultrasound, on 03/10/2015. The nodule was seen in the left lobe and measured 0.9 x 0.8 x 0.9 centimeters.  Thyroid U/S (05/23/2015): Left lower pole solid hypoechoic nodule measures 10 x 8 x 8 mm, nonspecific. There appears to be minor associated microcalcification.    L nodule was small, but hypoechoic and with possible small calcifications >> I suggested FNA.  Adequacy Reason Satisfactory For Evaluation. Diagnosis THYROID, LEFT LOBE INFERIOR, FINE NEEDLE ASPIRATION (SPECIMEN 1 OF 1, COLLECTED ON 06/07/2015): POSITIVE FOR PAPILLARY THYROID CARCINOMA ([place] CATEGORY VI). Teresa Molina Pathologist, Electronic Signature (Case signed 06/08/2015) Specimen Clinical Information Left lower pole solid hypoechoic nodule measures 10 x 8 x 8 mm, nonspecific, There appears to be minor associated microcalcification Source Thyroid, Fine Needle Aspiration, Left Lobe Inferior, (Specimen 1 of 1, collected on 06/07/15 )  Patient had total thyroidectomy by Dr. Harlow Asa on 07/21/2015. Final pathology showed: Diagnosis Thyroid, thyroidectomy, total - PAPILLARY CARCINOMA, CLASSIC VARIANT, SPANNING 1.1 CM. - EXTRATHYROIDAL EXTENSION PRESENT. - RESECTION MARGINS ARE NEGATIVE. - NO LYMPHOVASCULAR INVASION. - PARATHYROID TISSUE PRESENT. - SEE ONCOLOGY TABLE. Microscopic Comment THYROID Specimen: Total thyroid. Procedure (including lymph node sampling if applicable): Total thyroidectomy. Specimen Integrity (intact/fragmented): Intact. Tumor focality: Unifocal. Dominant tumor: Maximum tumor size  (cm): 1.1 cm. Tumor laterality: Left. Histologic type (including subtype and/or unique features as applicable): Papillary carcinoma, classic variant. Tumor capsule: Partial. Extrathyroidal extension: Present. Capsular invasion with degree of invasion if present: N/A. Margins: Negative. Lymphatic or vascular invasion: Not identified. Lymph nodes: # examined 0; # positive; 0 TNM code: pT3, pNX Non-neoplastic thyroid: Nodular hyperplasia, benign parathyroid tissue.  10/19/2015: RAI tx 123 mCi 10/28/2015: post-tx WBS: No metastasis 10/25/2016: Neck U/S:  no residual thyroid tissue or adenopathy  At last check, thyroglobulin was very low, with undetectable ATA antibodies: Lab Results  Component Value Date   THYROGLB <0.1 (L) 10/25/2017   THYROGLB 0.1 (L) 06/07/2017   THYROGLB <0.1 (L) 10/25/2016   THYROGLB 0.3 (L) 11/07/2015   THGAB <1 10/25/2017   THGAB <1 06/07/2017   THGAB <1 10/25/2016   THGAB <1 11/07/2015   Pt is on Synthroid d.a.w. 88 mcg daily, taken: - in am - fasting - at least 30 min from b'fast - no Ca, Fe, MVI, PPIs - + H2 blocker (Pepcid)  - 2x a day: with L and D - not on Biotin  Reviewed patient's TFTs: Lab Results  Component Value Date   TSH 0.96 10/25/2017   TSH 3.34 06/07/2017   TSH 2.29 10/25/2016   TSH 2.28 07/25/2016   TSH 1.64 06/01/2016   TSH 1.51 04/26/2016   TSH 0.74 02/23/2016   TSH 0.64 11/07/2015   TSH 0.98 08/29/2015   FREET4 1.05 10/25/2017   FREET4 1.06 06/07/2017   FREET4 0.87 10/25/2016   FREET4 1.02 07/25/2016   FREET4 0.92 06/01/2016   FREET4 1.13 04/26/2016   FREET4 0.80 02/23/2016   FREET4 1.18 11/07/2015   FREET4 1.28 08/29/2015  04/05/2017: TSH 7.87 01/02/2016: TSH 0.22. 03/07/2015: TSH 3.22, fT4 0.99   She had a Calcium of 7.4 postop >> started CaCO3. Latest calcium  level: 04/08/2017: 8.3 Lab Results  Component Value Date   CALCIUM 8.8 11/07/2015   CALCIUM 7.4 (L) 07/22/2015   CALCIUM 9.4 07/13/2015   Pt  denies: - feeling nodules in neck - hoarseness - dysphagia - choking - SOB with lying down  No FH of thyroid ds. No FH of thyroid cancer. No FH of thyroid cancer. No h/o radiation tx to head or neck except for RAI treatment.  No herbal supplements. No Biotin use. No recent steroids use.   She has a history of HTN, HL, GERD, sleep apnea.  Last HbA1c was 6.8% in 06/2018.  She takes a vitamin D supplement.  ROS: Constitutional: no weight gain/no weight loss, no fatigue, no subjective hyperthermia, no subjective hypothermia Eyes: no blurry vision, no xerophthalmia ENT: no sore throat, + see HPI Cardiovascular: no CP/no SOB/no palpitations/no leg swelling Respiratory: no cough/no SOB/no wheezing Gastrointestinal: no N/no V/no D/no C/no acid reflux Musculoskeletal: no muscle aches/no joint aches Skin: no rashes, no hair loss Neurological: no tremors/no numbness/no tingling/no dizziness  I reviewed pt's medications, allergies, PMH, social hx, family hx, and changes were documented in the history of present illness. Otherwise, unchanged from my initial visit note.  Past Medical History:  Diagnosis Date  . Allergic conjunctivitis   . ALLERGIC RHINITIS   . Breast cyst    left breast - ultrasound benign 2010  . Cancer ([place])    Thyroid  . Esophageal reflux   . Food allergy    angioedema > strawberries  . GERD (gastroesophageal reflux disease)   . Hypertension   . Insomnia   . OSA on CPAP    NPSG 08-03-09: AHI 17.6; CPAP 12/ AHI 0; PLMA- mild does not wear cpap   . PONV (postoperative nausea and vomiting)    Past Surgical History:  Procedure Laterality Date  . BREAST SURGERY  2013   Rt breast mass excision  . CATARACT EXTRACTION, BILATERAL    . CHOLECYSTECTOMY  1991  . DILATION AND CURETTAGE OF UTERUS    . ROTATOR CUFF REPAIR  2006  . squamous cell excised    . THYROIDECTOMY N/A 07/21/2015   Procedure: TOTAL THYROIDECTOMY;  Surgeon: Armandina Gemma, Molina;  Location: [facility];   Service: General;  Laterality: N/A;  . TONSILLECTOMY  1952 - approximate  . TOTAL ABDOMINAL HYSTERECTOMY  1978   Social History   Social History  . Marital Status: Divorced    Spouse Name: N/A  . Number of Children: 2   Occupational History  . 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 Smoking status: Never Smoker   . Smokeless tobacco: Never Used  . Alcohol Use: 0.0 oz/week    0 Standard drinks or equivalent per week     Comment: RARELY  . Drug Use: No   Current Outpatient Medications on File Prior to Visit  Medication Sig Dispense Refill  . Cholecalciferol (VITAMIN D3) 3000 units TABS Take 1 tablet by mouth daily.    Marland Kitchen ibuprofen (ADVIL,MOTRIN) 200 MG tablet Take 200 mg by mouth as needed for moderate pain. Reported on 05/11/2015    . labetalol (NORMODYNE) 200 MG tablet Take 1 tablet (200 mg total) by mouth 2 (two) times daily. 60 tablet 0  . Suvorexant (BELSOMRA) 15 MG TABS Take 15 mg by mouth at bedtime. 30 tablet 5  . SYNTHROID 88 MCG tablet TAKE ONE TABLET BY  MOUTH IN THE MORNING 90 tablet 2  . triamterene-hydrochlorothiazide (MAXZIDE-25) 37.5-25 MG per tablet Take 1 tablet by mouth daily. 30 tablet 0   No current facility-administered medications on file prior to visit.    Allergies  Allergen Reactions  . Amlodipine Swelling  . Diovan [Valsartan] Swelling  . Lipitor [Atorvastatin Calcium] Other (See Comments)    Caused muscle paralysis in legs  . Codeine Nausea And Vomiting  . Compazine [Prochlorperazine Edisylate] Other (See Comments)    Facial  muscle spasms   . Crestor [Rosuvastatin] Other (See Comments)    Severe joint pain.  . Reglan [Metoclopramide] Other (See Comments)    Facial muscle spasms  . Zoloft [Sertraline Hcl] Other (See Comments)    tremors  . Benadryl [Diphenhydramine Hcl] Palpitations   Family History  Problem Relation Age of Onset  . Other Father         brain tumor  . COPD Mother    PE: BP 140/70   Pulse 66   Ht 5' 2.5" (1.588 m) Comment: measured  Wt 178 lb (80.7 kg)   SpO2 97%   BMI 32.04 kg/m  Body mass index is 32.04 kg/m. Wt Readings from Last 3 Encounters:  07/23/18 178 lb (80.7 kg)  07/22/18 177 lb 6.4 oz (80.5 kg)  04/24/18 177 lb (80.3 kg)   Constitutional: overweight, in NAD Eyes: PERRLA, EOMI, no exophthalmos ENT: moist mucous membranes, no neck masses palpated, thyroidectomy scar healed, no cervical lymphadenopathy Cardiovascular: RRR, No MRG Respiratory: CTA B Gastrointestinal: abdomen soft, NT, ND, BS+ Musculoskeletal: no deformities, strength intact in all 4 Skin: moist, warm, no rashes Neurological: no tremor with outstretched hands, DTR normal in all 4  ASSESSMENT: 1. Papillary thyroid cancer  2. Postsurgical hypothyroidism  3.  History of postsurgical hypocalcemia  PLAN: 1. PTC -Patient with a small classical papillary thyroid cancer, 1.1 cm, unilateral.  There was no lymphovascular invasion, however, there has been extrathyroidal extension.  Because of this, her tumor was considered intermediate risk rather than low risk.  She had radioactive iodine treatment with Thyrogen.  No metastasis on the posttreatment whole body scan.  She then had a neck ultrasound in 10/2016 showing no recurrence.  We discussed that we will need another neck ultrasound only if the thyroglobulin starts trending up. -At last check, thyroglobulin was undetectable, as were her antithyroid antibodies -We will repeat them today -She prefers to come back in 6 months, rather than 1 year.  2. Postsurgical hypothyroidism - latest thyroid labs reviewed with pt >> normal 01/2018 - she continues on LT4 88 Mcg daily.   - pt feels good on this dose. She had increased sweating on higher doses in the past. - we discussed about taking the thyroid hormone every day, with water, >30 minutes before breakfast, separated by >4 hours from acid  reflux medications, calcium, iron, multivitamins. Pt. is taking it correctly. - will check thyroid tests today: TSH and fT4 - If labs are abnormal, she will need to return for repeat TFTs in 1.5 months  3.  History of postsurgical hypocalcemia -Latest calcium available for review is from 04/08/2017: 8.3 (normal higher than 8.5) -We will repeat her calcium level today.  We will check an ionized calcium along with a vitamin D level.  Component     Latest Ref Rng & Units 07/23/2018  Thyroglobulin     ng/mL <0.1 (L)  Comment        TSH     0.35 - 4.50 uIU/mL  0.37  T4,Free(Direct)     0.60 - 1.60 ng/dL 1.19  Thyroglobulin Ab     < or = 1 IU/mL <1  VITD     30.00 - 100.00 ng/mL 32.69  Calcium Ionized     4.8 - 5.6 mg/dL 4.66 (L)   Thyroid tests are normal.  Thyroglobulin and ATA antibodies are undetectable. However, calcium level is slightly low.  I will advised her to start 500 mg of calcium with dinner.  We can repeat the test in 6 months.  Philemon Kingdom, MD PhD [facility]

## 2018-07-23 NOTE — Patient Instructions (Signed)
Please continue Synthroid 88 mcg daily. ° °Take the thyroid hormone every day, with water, at least 30 minutes before breakfast, separated by at least 4 hours from: °- acid reflux medications °- calcium °- iron °- multivitamins ° °Please stop at the lab. ° °Please come back for a follow-up appointment in 6 months. ° °

## 2018-07-24 LAB — THYROGLOBULIN LEVEL: Thyroglobulin: <0.1 ng/mL — ABNORMAL LOW

## 2018-07-24 LAB — CALCIUM, IONIZED: Calcium, Ion: 4.66 mg/dL — ABNORMAL LOW (ref 4.8–5.6)

## 2018-07-24 LAB — THYROGLOBULIN ANTIBODY: Thyroglobulin Ab: <1 IU/mL (ref <=1)

## 2018-09-24 DIAGNOSIS — E7800 Pure hypercholesterolemia, unspecified: Secondary | ICD-10-CM | POA: Diagnosis not present

## 2018-09-24 DIAGNOSIS — I10 Essential (primary) hypertension: Secondary | ICD-10-CM | POA: Diagnosis not present

## 2018-09-24 DIAGNOSIS — E039 Hypothyroidism, unspecified: Secondary | ICD-10-CM | POA: Diagnosis not present

## 2018-09-24 DIAGNOSIS — E119 Type 2 diabetes mellitus without complications: Secondary | ICD-10-CM | POA: Diagnosis not present

## 2018-09-28 DIAGNOSIS — R69 Illness, unspecified: Secondary | ICD-10-CM | POA: Diagnosis not present

## 2018-10-23 ENCOUNTER — Ambulatory Visit: Payer: Medicare HMO | Admitting: Internal Medicine

## 2018-11-19 DIAGNOSIS — R58 Hemorrhage, not elsewhere classified: Secondary | ICD-10-CM | POA: Diagnosis not present

## 2018-12-26 DIAGNOSIS — R69 Illness, unspecified: Secondary | ICD-10-CM | POA: Diagnosis not present

## 2018-12-31 DIAGNOSIS — E119 Type 2 diabetes mellitus without complications: Secondary | ICD-10-CM | POA: Diagnosis not present

## 2018-12-31 DIAGNOSIS — I10 Essential (primary) hypertension: Secondary | ICD-10-CM | POA: Diagnosis not present

## 2018-12-31 DIAGNOSIS — E7800 Pure hypercholesterolemia, unspecified: Secondary | ICD-10-CM | POA: Diagnosis not present

## 2018-12-31 DIAGNOSIS — E039 Hypothyroidism, unspecified: Secondary | ICD-10-CM | POA: Diagnosis not present

## 2019-01-21 ENCOUNTER — Other Ambulatory Visit: Payer: Self-pay

## 2019-01-23 ENCOUNTER — Other Ambulatory Visit: Payer: Self-pay

## 2019-01-23 ENCOUNTER — Encounter: Payer: Self-pay | Admitting: Internal Medicine

## 2019-01-23 ENCOUNTER — Ambulatory Visit: Payer: Medicare HMO | Admitting: Internal Medicine

## 2019-01-23 VITALS — BP 112/60 | HR 68 | Temp 98.0°F | Ht 62.5 in | Wt 162.8 lb

## 2019-01-23 VITALS — BP 130/70 | HR 72 | Ht 62.5 in | Wt 163.0 lb

## 2019-01-23 DIAGNOSIS — R490 Dysphonia: Secondary | ICD-10-CM | POA: Diagnosis not present

## 2019-01-23 DIAGNOSIS — E890 Postprocedural hypothyroidism: Secondary | ICD-10-CM

## 2019-01-23 DIAGNOSIS — R69 Illness, unspecified: Secondary | ICD-10-CM | POA: Diagnosis not present

## 2019-01-23 DIAGNOSIS — Z23 Encounter for immunization: Secondary | ICD-10-CM

## 2019-01-23 DIAGNOSIS — Z20822 Contact with and (suspected) exposure to covid-19: Secondary | ICD-10-CM

## 2019-01-23 DIAGNOSIS — F5101 Primary insomnia: Secondary | ICD-10-CM | POA: Diagnosis not present

## 2019-01-23 DIAGNOSIS — C73 Malignant neoplasm of thyroid gland: Secondary | ICD-10-CM

## 2019-01-23 DIAGNOSIS — R6889 Other general symptoms and signs: Secondary | ICD-10-CM | POA: Diagnosis not present

## 2019-01-23 LAB — TSH: TSH: 0.54 u[IU]/mL (ref 0.35–4.50)

## 2019-01-23 LAB — T4, FREE: Free T4: 1.21 ng/dL (ref 0.60–1.60)

## 2019-01-23 MED ORDER — ESZOPICLONE 3 MG PO TABS: ORAL_TABLET | ORAL | 1 refills | Status: DC

## 2019-01-23 MED ORDER — AZITHROMYCIN 250 MG PO TABS: ORAL_TABLET | ORAL | 2 refills | Status: DC

## 2019-01-23 NOTE — Patient Instructions (Addendum)
Please stop at the lab.  Please continue Synthroid 88 mcg daily.  Also, continue calcium 500 mg daily with dinner - change to Tums.  Start vitamin D 1000 units daily.  Take the thyroid hormone every day, with water, at least 30 minutes before breakfast, separated by at least 4 hours from: - acid reflux medications - calcium - iron - multivitamins  Please come back for a follow-up appointment in 6 months.

## 2019-01-23 NOTE — Progress Notes (Signed)
Patient ID: Teresa Molina, female   DOB: 1946/05/14, 73 y.o.   MRN: [id number]   HPI  Teresa Molina is a 73 y.o.-year-old female, initially referred by Dr. Rush Molina for f/u for papillary thyroid cancer and postsurgical hypothyroidism and hypocalcemia. Last visit 6 months ago. PCP: Dr. Shelia Molina.  Reviewed history: Patient has been found to have an incidental thyroid nodule during a recent carotid ultrasound, on 03/10/2015. The nodule was seen in the left lobe and measured 0.9 x 0.8 x 0.9 centimeters.  Thyroid U/S (05/23/2015): Left lower pole solid hypoechoic nodule measures 10 x 8 x 8 mm, nonspecific. There appears to be minor associated microcalcification.    L nodule was small, but hypoechoic and with possible small calcifications >> I suggested FNA.  Adequacy Reason Satisfactory For Evaluation. Diagnosis THYROID, LEFT LOBE INFERIOR, FINE NEEDLE ASPIRATION (SPECIMEN 1 OF 1, COLLECTED ON 06/07/2015): POSITIVE FOR PAPILLARY THYROID CARCINOMA (Teresa Molina CATEGORY VI). Teresa Niece MD Pathologist, Electronic Signature (Case signed 06/08/2015) Specimen Clinical Information Left lower pole solid hypoechoic nodule measures 10 x 8 x 8 mm, nonspecific, There appears to be minor associated microcalcification Source Thyroid, Fine Needle Aspiration, Left Lobe Inferior, (Specimen 1 of 1, collected on 06/07/15 )  Patient had total thyroidectomy by Dr. Harlow Molina on 07/21/2015. Final pathology showed: Diagnosis Thyroid, thyroidectomy, total - PAPILLARY CARCINOMA, CLASSIC VARIANT, SPANNING 1.1 CM. - EXTRATHYROIDAL EXTENSION PRESENT. - RESECTION MARGINS ARE NEGATIVE. - NO LYMPHOVASCULAR INVASION. - PARATHYROID TISSUE PRESENT. - SEE ONCOLOGY TABLE. Microscopic Comment THYROID Specimen: Total thyroid. Procedure (including lymph node sampling if applicable): Total thyroidectomy. Specimen Integrity (intact/fragmented): Intact. Tumor focality: Unifocal. Dominant tumor: Maximum tumor size  (cm): 1.1 cm. Tumor laterality: Left. Histologic type (including subtype and/or unique features as applicable): Papillary carcinoma, classic variant. Tumor capsule: Partial. Extrathyroidal extension: Present. Capsular invasion with degree of invasion if present: N/A. Margins: Negative. Lymphatic or vascular invasion: Not identified. Lymph nodes: # examined 0; # positive; 0 TNM code: pT3, pNX Non-neoplastic thyroid: Nodular hyperplasia, benign parathyroid tissue.  10/19/2015: RAI tx 123 mCi 10/28/2015: post-tx WBS: No metastasis 10/25/2016: Neck U/S:  no residual thyroid tissue or adenopathy  Her thyroglobulin levels were undetectable, as were her ATA antibodies: Lab Results  Component Value Date   THYROGLB <0.1 (L) 07/23/2018   THYROGLB <0.1 (L) 10/25/2017   THYROGLB 0.1 (L) 06/07/2017   THYROGLB <0.1 (L) 10/25/2016   THYROGLB 0.3 (L) 11/07/2015   THGAB <1 07/23/2018   THGAB <1 10/25/2017   THGAB <1 06/07/2017   THGAB <1 10/25/2016   THGAB <1 11/07/2015   Pt is on Synthroid d.a.w. 88 mcg daily, taken: - in am - fasting - at least 30 min from b'fast - no Ca, Fe, MVI, PPIs, + on H2 blocker twice a day: With lunch and dinner - not on Biotin  Reviewed patient's TFTs: Lab Results  Component Value Date   TSH 0.37 07/23/2018   TSH 0.96 10/25/2017   TSH 3.34 06/07/2017   TSH 2.29 10/25/2016   TSH 2.28 07/25/2016   TSH 1.64 06/01/2016   TSH 1.51 04/26/2016   TSH 0.74 02/23/2016   TSH 0.64 11/07/2015   TSH 0.98 08/29/2015   FREET4 1.19 07/23/2018   FREET4 1.05 10/25/2017   FREET4 1.06 06/07/2017   FREET4 0.87 10/25/2016   FREET4 1.02 07/25/2016   FREET4 0.92 06/01/2016   FREET4 1.13 04/26/2016   FREET4 0.80 02/23/2016   FREET4 1.18 11/07/2015   FREET4 1.28 08/29/2015  04/05/2017: TSH 7.87 01/02/2016: TSH 0.22. 03/07/2015: TSH  3.22, fT4 0.99   She also has postsurgical hypocalcemia: She had a Calcium of 7.4 postop >> started CaCO3, but she came off afterwards.   At last visit, since ionized calcium was low, will be started calcium 500 mg with dinner.  She continues on this dose.  Reviewed previous levels 07/23/2018: Ionized calcium 4.66 (4.8-5.6) 04/08/2017: Calcium 8.3 Lab Results  Component Value Date   CALCIUM 8.8 11/07/2015   CALCIUM 7.4 (L) 07/22/2015   CALCIUM 9.4 07/13/2015   Of note, her vitamin D level is normal: Lab Results  Component Value Date   VD25OH 32.69 07/23/2018   Pt denies: - feeling nodules in neck - dysphagia - choking - SOB with lying down + hoarseness - laryningitis.  No FH of thyroid ds. No FH of thyroid cancer. No h/o radiation tx to head or neck.  No herbal supplements. No Biotin use. No recent steroids use.   She has a history of HTN, HL, GERD, sleep apnea.  HbA1c was 6.8% in 06/2018. In 09/2018: 7.4%. Since then, she started to reduce carbs >> lost 15 lbs! Latest HbA1c 12/2018: 6.4%  She is on vitamin D - 600 units.  ROS: Constitutional: no weight gain/+ weight loss, no fatigue, no subjective hyperthermia, no subjective hypothermia Eyes: no blurry vision, no xerophthalmia ENT: no sore throat, + see HPI Cardiovascular: no CP/no SOB/no palpitations/no leg swelling Respiratory: no cough/no SOB/no wheezing Gastrointestinal: no N/no V/no D/no C/no acid reflux Musculoskeletal: no muscle aches/no joint aches Skin: no rashes, no hair loss Neurological: no tremors/no numbness/no tingling/no dizziness  I reviewed pt's medications, allergies, PMH, social hx, family hx, and changes were documented in the history of present illness. Otherwise, unchanged from my initial visit note.  Past Medical History:  Diagnosis Date   Allergic conjunctivitis    ALLERGIC RHINITIS    Breast cyst    left breast - ultrasound benign 2010   Cancer ([place])    Thyroid   Esophageal reflux    Food allergy    angioedema > strawberries   GERD (gastroesophageal reflux disease)    Hypertension    Insomnia    OSA on CPAP     NPSG 08-03-09: AHI 17.6; CPAP 12/ AHI 0; PLMA- mild does not wear cpap    PONV (postoperative nausea and vomiting)    Past Surgical History:  Procedure Laterality Date   BREAST SURGERY  2013   Rt breast mass excision   CATARACT EXTRACTION, BILATERAL     CHOLECYSTECTOMY  1991   DILATION AND CURETTAGE OF UTERUS     ROTATOR CUFF REPAIR  2006   squamous cell excised     THYROIDECTOMY N/A 07/21/2015   Procedure: TOTAL THYROIDECTOMY;  Surgeon: Armandina Gemma, MD;  Location: [facility];  Service: General;  Laterality: N/A;   TONSILLECTOMY  1952 - approximate   TOTAL ABDOMINAL HYSTERECTOMY  1978   Social History   Social History   Marital Status: Divorced    Spouse Name: N/A   Number of Children: 2   Occupational History   Retired from [employer]     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     ibuprofen (ADVIL,MOTRIN) 200 MG tablet Take 200 mg by mouth as needed for moderate pain. Reported on 05/11/2015     labetalol (NORMODYNE) 200 MG tablet Take 1 tablet (200 mg total) by mouth 2 (two) times daily. 60 tablet 0   Suvorexant (BELSOMRA) 15 MG TABS Take 15 mg by mouth at bedtime. 30 tablet 5   SYNTHROID 88 MCG tablet TAKE ONE TABLET BY MOUTH IN THE MORNING 90 tablet 2   triamterene-hydrochlorothiazide (MAXZIDE-25) 37.5-25 MG per tablet Take 1 tablet by mouth daily. 30 tablet 0   No current facility-administered medications on file prior to visit.    Allergies  Allergen Reactions   Amlodipine Swelling   Diovan [Valsartan] Swelling   Lipitor [Atorvastatin Calcium]  Other (See Comments)    Caused muscle paralysis in legs   Codeine Nausea And Vomiting   Compazine [Prochlorperazine Edisylate] Other (See Comments)    Facial  muscle spasms    Crestor [Rosuvastatin] Other (See Comments)    Severe joint pain.   Reglan [Metoclopramide] Other (See Comments)    Facial muscle spasms   Zoloft [Sertraline Hcl] Other (See Comments)    tremors   Benadryl [Diphenhydramine Hcl] Palpitations   Family History  Problem Relation Age of Onset   Other Father        brain tumor   COPD Mother    PE: BP 130/70    Pulse 72    Ht 5' 2.5" (1.588 m)    Wt 163 lb (73.9 kg)    SpO2 97%    BMI 29.34 kg/m  Body mass index is 29.34 kg/m. Wt Readings from Last 3 Encounters:  01/23/19 163 lb (73.9 kg)  07/23/18 178 lb (80.7 kg)  07/22/18 177 lb 6.4 oz (80.5 kg)   Constitutional: Slightly overweight, in NAD Eyes: PERRLA, EOMI, no exophthalmos ENT: moist mucous membranes, no neck masses palpated, no cervical lymphadenopathy Cardiovascular: RRR, No MRG Respiratory: CTA B Gastrointestinal: abdomen soft, NT, ND, BS+ Musculoskeletal: no deformities, strength intact in all 4 Skin: moist, warm, no rashes Neurological: no tremor with outstretched hands, DTR normal in all 4  ASSESSMENT: 1. Papillary thyroid cancer  2. Postsurgical hypothyroidism  3.  Postsurgical hypocalcemia  PLAN: 1. PTC -Patient with a small classical papillary thyroid cancer, 1.1 cm, unilateral.  There was no lymphovascular invasion, however, there has been extrathyroidal extension.  Because of this, her tumor was considered intermediate risk rather than low risk.  She had radioactive iodine treatment with Thyrogen.  No metastases on the posttreatment whole-body scan.  Her latest neck ultrasound was from 10/2016 showing no recurrence.  We discussed that we will need another neck ultrasound only if the thyroglobulin starts trending up or if she develops any neck masses. -At last visit we checked  her thyroglobulin and her antithyroid antibodies and they were undetectable -She prefers to come back in 6 months, rather than 1 year.  We will repeat her thyroglobulin and ATA antibodies at next visit, in 6 months.  2. Postsurgical hypothyroidism - latest thyroid labs reviewed with pt >> normal 07/2018, but TSH was close to the lower limit of normal, at 0.37 - she continues on LT4 88 mcg daily - pt feels good on this dose, but we discussed about rechecking her tests today since she lost a significant amount of weight since last visit and, with weight loss, she may need a  lower dose of levothyroxine. - we discussed about taking the thyroid hormone every day, with water, >30 minutes before breakfast, separated by >4 hours from acid reflux medications, calcium, iron, multivitamins. Pt. is taking it correctly. - will check thyroid tests today: TSH and fT4 - If labs are abnormal, she will need to return for repeat TFTs in 1.5 months  3.  Postsurgical hypocalcemia -Her calcium level from 04/08/2017 was slightly low: 8.3 (normal higher than 8.5).  At last visit, we checked an ionized calcium level and this was also slightly low.  Vitamin D level was normal. -In 07/2018 we started calcium 500 mg with dinner.  At this visit, she complains about the size of the tablet.  I advised her that she can take Tums that are chewable. -At this visit we will repeat her ionized calcium level  Component     Latest Ref Rng & Units 01/23/2019  TSH     0.35 - 4.50 uIU/mL 0.54  T4,Free(Direct)     0.60 - 1.60 ng/dL 1.21  Calcium Ionized     4.8 - 5.6 mg/dL 4.74 (L)   TFTs normal.  Calcium very slightly low, but this is actually at target for postsurgical hypocalcemia.  Continue the current calcium supplement.  Philemon Kingdom, MD PhD [facility]

## 2019-01-23 NOTE — Progress Notes (Signed)
HPI HPI female never smoker followed for allergic rhinitis/conjunctivitis, insomnia, OSA/failed CPAP and Provent nasal valves, complicated by history of migraine, hypertension, GERD, thyroid cancer/ sgy/ RAI  --------------------------------------------------------------------------------------------------  07/21/2018- 73 year old female never smoker followed for allergic rhinitis/conjunctivitis, Insomnia, OSA/failed CPAP and Provent nasal valves, complicated by history of migraine, hypertension, GERD, thyroid cancer/ sgy/ RAI -----breathing's been fine, having trouble sleeping Describes problem mainly maintaining sleep at night.  Can fall asleep okay.  I suggested we try Belsomra again.  Job stresses get worse around the time since she works for [employer]. Recent evaluation by [facility] has shown evidence of previous gastritis.  Now 3 years after thyroid cancer.  She is not entirely comfortable with thyroid hormone status but is followed by endocrinology.  01/23/2019- 73 year old female never smoker followed for allergic rhinitis/conjunctivitis, Insomnia, OSA/failed CPAP and Provent nasal valves, complicated by history of migraine, hypertension, GERD, thyroid cancer/ sgy/ RAI Belsomra 15 mg- too expensive, never really tried -----pt reports seasonal allergies, states sleep has been "awful" Chronic busy brain. Agrees to try Lunesta again. Reports "the crud" this week with hoarseness, mild occasional cough, no fever/diarrhea or rash. Covid careful with no known exposure. Has chronic dry skin type pruritus- scratches herself in sleep.  Has been dieting- low carbs.   Review of Systems-see HPI + = positive Constitutional:     +weight loss-diet, night sweats, fevers, chills, fatigue, lassitude. + insomnia HEENT:   +headaches, difficulty swallowing, tooth/dental problems, +sore throat,       No- sneezing, itching, no-ears itch,+nasal congestion, post nasal drip,  CV:  No-   chest pain,  orthopnea, PND, swelling in lower extremities, anasarca, dizziness, palpitations Resp: No-   shortness of breath with exertion or at rest.           productive cough, +non-productive cough,  No-  coughing up of blood.              No-   change in color of mucus.  No- wheezing.   Skin: No-   rash or lesions. GI:  No-   heartburn, indigestion, abdominal pain, nausea, vomiting,  GU: MS:  No-   joint pain or swelling.   Neuro- :  Psych:  No- change in mood or affect. Some- depression or anxiety.  No memory loss.   Physical Exam  General- Alert, Oriented, Affect-appropriate, very pleasant,  Skin- rash-none, lesions- none, excoriation- none Lymphadenopathy- none Head- atraumatic            Eyes- Gross vision intact, PERRLA, conjunctivae clear secretions            Ears- Canals-, TMs ok,             Nose- clear , No- sores,  no -Septal dev, mucus, polyps, erosion, perforation.             Throat- Mallampati III , mucosa clear-not red , drainage- none, tonsils- atrophic   Neck- flexible , trachea midline, no stridor , thyroid + incision scar barely visible, carotid no bruit Chest - symmetrical excursion , unlabored           Heart/CV- RRR , no murmur , no gallop  , no rub, nl s1 s2                           - JVD- none , edema- none, stasis changes- none, varices- none           Lung- clear,  wheeze- none, cough- none, dullness-none, rub- none           Chest wall-  Abd- Br/ Gen/ Rectal- Not done, not indicated Extrem- cyanosis- none, clubbing, none, atrophy- none, strength- nl Neuro- grossly intact to observation

## 2019-01-23 NOTE — Assessment & Plan Note (Signed)
Chronic primary insomnia with a component of psychophysiologic insomnia. Plan- try limited script for Lunesta 3 mg for trial

## 2019-01-23 NOTE — Patient Instructions (Signed)
Script for Lunesta-    1 at bedtime for sleep  Order- Flu vax senior  Order- Nasal swab Covid      Questionable symptoms

## 2019-01-23 NOTE — Assessment & Plan Note (Signed)
Minor viral URI vs seasonal allergy. Plan- fluids etc, Zpak to hold, Covid swab to be careful ( low concern)

## 2019-01-24 LAB — CALCIUM, IONIZED: Calcium, Ion: 4.74 mg/dL — ABNORMAL LOW (ref 4.8–5.6)

## 2019-01-25 LAB — NOVEL CORONAVIRUS, NAA: SARS-CoV-2, NAA: DETECTED — AB

## 2019-01-26 ENCOUNTER — Encounter: Payer: Self-pay | Admitting: Internal Medicine

## 2019-01-30 ENCOUNTER — Other Ambulatory Visit: Payer: Self-pay

## 2019-01-30 ENCOUNTER — Encounter: Payer: Self-pay | Admitting: Internal Medicine

## 2019-01-30 ENCOUNTER — Ambulatory Visit (INDEPENDENT_AMBULATORY_CARE_PROVIDER_SITE_OTHER): Payer: Medicare HMO | Admitting: Internal Medicine

## 2019-01-30 DIAGNOSIS — R69 Illness, unspecified: Secondary | ICD-10-CM | POA: Diagnosis not present

## 2019-01-30 DIAGNOSIS — F5101 Primary insomnia: Secondary | ICD-10-CM | POA: Diagnosis not present

## 2019-01-30 DIAGNOSIS — U071 COVID-19: Secondary | ICD-10-CM

## 2019-01-30 NOTE — Assessment & Plan Note (Signed)
Self quarantine and Covid precautions. Adequate hydration and rest. Contact us if concerning changes.

## 2019-01-30 NOTE — Progress Notes (Signed)
HPI HPI female never smoker followed for allergic rhinitis/conjunctivitis, insomnia, OSA/failed CPAP and Provent nasal valves, complicated by history of migraine, hypertension, GERD, thyroid cancer/ sgy/ RAI  --------------------------------------------------------------------------------------------------  01/23/2019- 73 year old female never smoker followed for allergic rhinitis/conjunctivitis, Insomnia, OSA/failed CPAP and Provent nasal valves, complicated by history of migraine, hypertension, GERD, thyroid cancer/ sgy/ RAI Belsomra 15 mg- too expensive, never really tried -----pt reports seasonal allergies, states sleep has been "awful" Chronic busy brain. Agrees to try Lunesta again. Reports "the crud" this week with hoarseness, mild occasional cough, no fever/diarrhea or rash. Covid careful with no known exposure. Has chronic dry skin type pruritus- scratches herself in sleep.  Has been dieting- low carbs.   01/30/2019- Virtual Visit via Telephone Note  I connected with Vita Barley on 01/30/19 at 11:30 AM EDT by telephone and verified that I am speaking with the correct person using two identifiers.  Location: Patient: H Provider: O   I discussed the limitations, risks, security and privacy concerns of performing an evaluation and management service by telephone and the availability of in person appointments. I also discussed with the patient that there may be a patient responsible charge related to this service. The patient expressed understanding and agreed to proceed.   History of Present Illness: 73 year old female never smoker followed for allergic rhinitis/conjunctivitis, Insomnia, OSA/failed CPAP and Provent nasal valves, complicated by history of migraine, hypertension, GERD, thyroid cancer/ sgy/ RAI Covid nasal swab  01/23/2019- POSITIVE- done at last ov because of 1 week URI symptoms.  She has been self-quarantining. Noting more dry cough, weakness but not severe. Aching a  few days ago. Feels clammy. Sleeping a lot. No GI and no rash or los of taste/ smell.  Hasn't tried Lunesta yet for Insomnia.  Health Dept has contacted her and indicated she could be going out next Monday with precautions.   Observations/Objective: SARS-CoV-2, NAA    Positive  9//03/2019  Assessment and Plan: Covid-19 Positive. Not emergently ill at 1 week after dx. Plan- Hydration, rest, quarantine for another week and precautions.  Insomnia- has Lunesta to try when ready. Follow Up Instructions: Keep January appointment. Stay in touch.    I discussed the assessment and treatment plan with the patient. The patient was provided an opportunity to ask questions and all were answered. The patient agreed with the plan and demonstrated an understanding of the instructions.   The patient was advised to call back or seek an in-person evaluation if the symptoms worsen or if the condition fails to improve as anticipated.  I provided 18 minutes of non-face-to-face time during this encounter.   Baird Lyons, MD    Review of Systems-see HPI + = positive Constitutional:     +weight loss-diet, night sweats, fevers, chills, fatigue, lassitude. + insomnia HEENT:   +headaches, difficulty swallowing, tooth/dental problems, +sore throat,       No- sneezing, itching, no-ears itch,+nasal congestion, post nasal drip,  CV:  No-   chest pain, orthopnea, PND, swelling in lower extremities, anasarca, dizziness, palpitations Resp: No-   shortness of breath with exertion or at rest.           productive cough, +non-productive cough,  No-  coughing up of blood.              No-   change in color of mucus.  No- wheezing.   Skin: No-   rash or lesions. GI:  No-   heartburn, indigestion, abdominal pain, nausea, vomiting,  GU: MS:  No-   joint pain or swelling.   Neuro- :  Psych:  No- change in mood or affect. Some- depression or anxiety.  No memory loss.   Physical Exam  General- Alert, Oriented,  Affect-appropriate, very pleasant,  Skin- rash-none, lesions- none, excoriation- none Lymphadenopathy- none Head- atraumatic            Eyes- Gross vision intact, PERRLA, conjunctivae clear secretions            Ears- Canals-, TMs ok,             Nose- clear , No- sores,  no -Septal dev, mucus, polyps, erosion, perforation.             Throat- Mallampati III , mucosa clear-not red , drainage- none, tonsils- atrophic   Neck- flexible , trachea midline, no stridor , thyroid + incision scar barely visible, carotid no bruit Chest - symmetrical excursion , unlabored           Heart/CV- RRR , no murmur , no gallop  , no rub, nl s1 s2                           - JVD- none , edema- none, stasis changes- none, varices- none           Lung- clear, wheeze- none, cough- none, dullness-none, rub- none           Chest wall-  Abd- Br/ Gen/ Rectal- Not done, not indicated Extrem- cyanosis- none, clubbing, none, atrophy- none, strength- nl Neuro- grossly intact to observation

## 2019-01-30 NOTE — Patient Instructions (Signed)
Please continue careful isolation and Covid precautions - mask, distancing and handwashing, to protect others.  Stay well hydrated and well rested.  Please call if anything changes to concern you.

## 2019-01-30 NOTE — Assessment & Plan Note (Signed)
Chronic problem.  Plan- fill and try script for Lunesta as needed. As discussed.

## 2019-02-16 ENCOUNTER — Other Ambulatory Visit: Payer: Self-pay | Admitting: Internal Medicine

## 2019-02-16 MED ORDER — TRAMADOL HCL 50 MG PO TABS: 50.0000 mg | ORAL_TABLET | Freq: Four times a day (QID) | ORAL | 0 refills | Status: DC | PRN

## 2019-02-16 NOTE — Progress Notes (Signed)
Persistent dry cough in recovery from proven Covid. Plan- tramadol to control cough

## 2019-02-18 ENCOUNTER — Encounter: Payer: Self-pay | Admitting: Gynecology

## 2019-02-25 ENCOUNTER — Other Ambulatory Visit: Payer: Self-pay | Admitting: Internal Medicine

## 2019-03-23 DIAGNOSIS — R69 Illness, unspecified: Secondary | ICD-10-CM | POA: Diagnosis not present

## 2019-03-24 DIAGNOSIS — R69 Illness, unspecified: Secondary | ICD-10-CM | POA: Diagnosis not present

## 2019-03-30 DIAGNOSIS — E039 Hypothyroidism, unspecified: Secondary | ICD-10-CM | POA: Diagnosis not present

## 2019-03-30 DIAGNOSIS — E7800 Pure hypercholesterolemia, unspecified: Secondary | ICD-10-CM | POA: Diagnosis not present

## 2019-03-30 DIAGNOSIS — I10 Essential (primary) hypertension: Secondary | ICD-10-CM | POA: Diagnosis not present

## 2019-03-30 DIAGNOSIS — E119 Type 2 diabetes mellitus without complications: Secondary | ICD-10-CM | POA: Diagnosis not present

## 2019-04-08 DIAGNOSIS — E039 Hypothyroidism, unspecified: Secondary | ICD-10-CM | POA: Diagnosis not present

## 2019-04-08 DIAGNOSIS — L821 Other seborrheic keratosis: Secondary | ICD-10-CM | POA: Diagnosis not present

## 2019-04-08 DIAGNOSIS — I10 Essential (primary) hypertension: Secondary | ICD-10-CM | POA: Diagnosis not present

## 2019-04-08 DIAGNOSIS — E7800 Pure hypercholesterolemia, unspecified: Secondary | ICD-10-CM | POA: Diagnosis not present

## 2019-04-08 DIAGNOSIS — E119 Type 2 diabetes mellitus without complications: Secondary | ICD-10-CM | POA: Diagnosis not present

## 2019-04-27 ENCOUNTER — Other Ambulatory Visit: Payer: Self-pay

## 2019-04-28 ENCOUNTER — Ambulatory Visit: Payer: Medicare HMO | Admitting: Gynecology

## 2019-04-28 ENCOUNTER — Encounter: Payer: Self-pay | Admitting: Gynecology

## 2019-04-28 VITALS — BP 122/76 | Ht 63.0 in | Wt 157.0 lb

## 2019-04-28 DIAGNOSIS — Z01419 Encounter for gynecological examination (general) (routine) without abnormal findings: Secondary | ICD-10-CM | POA: Diagnosis not present

## 2019-04-28 DIAGNOSIS — N952 Postmenopausal atrophic vaginitis: Secondary | ICD-10-CM

## 2019-04-28 DIAGNOSIS — Z9189 Other specified personal risk factors, not elsewhere classified: Secondary | ICD-10-CM | POA: Diagnosis not present

## 2019-04-28 NOTE — Patient Instructions (Signed)
Schedule your mammogram in January when due.  Follow-up in 1 year for annual exam

## 2019-04-28 NOTE — Progress Notes (Signed)
    Teresa Molina [birth year] [id number]        72 y.o.  [id number] for breast and pelvic exam.  Without gynecologic complaints.  Past medical history,surgical history, problem list, medications, allergies, family history and social history were all reviewed and documented as reviewed in the EPIC chart.  ROS:  Performed with pertinent positives and negatives included in the history, assessment and plan.   Additional significant findings : None   Exam: Caryn Bee assistant Vitals:   04/28/19 0935  BP: 122/76  Weight: 157 lb (71.2 kg)  Height: 5\' 3"  (1.6 m)   Body mass index is 27.81 kg/m.  General appearance:  Normal affect, orientation and appearance. Skin: Grossly normal HEENT: Without gross lesions.  No cervical or supraclavicular adenopathy. Thyroid normal.  Lungs:  Clear without wheezing, rales or rhonchi Cardiac: RR, without RMG Abdominal:  Soft, nontender, without masses, guarding, rebound, organomegaly or hernia Breasts:  Examined lying and sitting without masses, retractions, discharge or axillary adenopathy. Pelvic:  Ext, BUS, Vagina: With atrophic changes  Adnexa: Without masses or tenderness    Anus and perineum: Normal   Rectovaginal: Normal sphincter tone without palpated masses or tenderness.    Assessment/Plan:  73 y.o. [id number] female for breast and pelvic exam.  1. Postmenopausal.  Status post hysterectomy in the past.  No menopausal symptoms. 2. Pap smear 2018.  No Pap smear done today.  No history of abnormal Pap smears previously.  Options to stop screening per current screening guidelines reviewed.  Will readdress on an annual basis. 3. Colonoscopy 2020.  Repeat at their recommended interval. 4. Mammography coming due in January and I reminded her to schedule this.  Breast exam normal today. 5. DEXA 2019 normal.  Plan repeat DEXA at 5-year interval. 6. Health maintenance.  No routine lab work done as patient does this elsewhere.  Follow-up 1 year, sooner as  needed.   [name] MD, 10:05 AM 04/28/2019

## 2019-05-28 ENCOUNTER — Encounter: Payer: Self-pay | Admitting: Internal Medicine

## 2019-05-28 ENCOUNTER — Other Ambulatory Visit: Payer: Self-pay

## 2019-05-28 ENCOUNTER — Ambulatory Visit (INDEPENDENT_AMBULATORY_CARE_PROVIDER_SITE_OTHER): Payer: Medicare HMO

## 2019-05-28 ENCOUNTER — Ambulatory Visit: Payer: Medicare HMO | Admitting: Internal Medicine

## 2019-05-28 VITALS — BP 124/70 | HR 67 | Temp 97.6°F | Ht 62.5 in | Wt 161.2 lb

## 2019-05-28 DIAGNOSIS — R0600 Dyspnea, unspecified: Secondary | ICD-10-CM | POA: Diagnosis not present

## 2019-05-28 DIAGNOSIS — F5101 Primary insomnia: Secondary | ICD-10-CM

## 2019-05-28 DIAGNOSIS — R0609 Other forms of dyspnea: Secondary | ICD-10-CM

## 2019-05-28 DIAGNOSIS — R69 Illness, unspecified: Secondary | ICD-10-CM | POA: Diagnosis not present

## 2019-05-28 DIAGNOSIS — R0602 Shortness of breath: Secondary | ICD-10-CM | POA: Diagnosis not present

## 2019-05-28 NOTE — Progress Notes (Signed)
HPI HPI female never smoker followed for allergic rhinitis/conjunctivitis, insomnia, OSA/failed CPAP and Provent nasal valves, complicated by history of migraine, hypertension, GERD, thyroid cancer/ sgy/ RAI  --------------------------------------------------------------------------------------------------   01/30/2019- Virtual Visit via Telephone Note History of Present Illness: 74 year old female never smoker followed for allergic rhinitis/conjunctivitis, Insomnia, OSA/failed CPAP and Provent nasal valves, complicated by history of migraine, hypertension, GERD, thyroid cancer/ sgy/ RAI Covid nasal swab  01/23/2019- POSITIVE- done at last ov because of 1 week URI symptoms.  She has been self-quarantining. Noting more dry cough, weakness but not severe. Aching a few days ago. Feels clammy. Sleeping a lot. No GI and no rash or los of taste/ smell.  Hasn't tried Lunesta yet for Insomnia.  Health Dept has contacted her and indicated she could be going out next Monday with precautions.   Observations/Objective: SARS-CoV-2, NAA    Positive  9//03/2019  Assessment and Plan: Covid-19 Positive. Not emergently ill at 1 week after dx. Plan- Hydration, rest, quarantine for another week and precautions.  Insomnia- has Lunesta to try when ready. Follow Up Instructions: Keep January appointment. Stay in touch.    05/28/19- 74 year old female never smoker followed for allergic rhinitis/conjunctivitis, Insomnia, OSA/failed CPAP and Provent nasal valves, complicated by history of migraine, hypertension, GERD, Thyroid Cancer/ sgy/ RAI, Covid infection 01/2019, -----Wants to discuss since covid infection in September 2020. Stable persistent note of shortness of breath as she first gets up and starts moving, but not with sustained activity such as walking. Onset with covid infection. Denies palpitation, edema, cough, wheeze. Also more prone to fatigue/ sleepiness.  Review of Systems-see HPI + =  positive Constitutional:     +weight loss-diet, night sweats, fevers, chills, fatigue, lassitude. + insomnia HEENT:   +headaches, difficulty swallowing, tooth/dental problems, +sore throat,       No- sneezing, itching, no-ears itch,+nasal congestion, post nasal drip,  CV:  No-   chest pain, orthopnea, PND, swelling in lower extremities, anasarca, dizziness, palpitations Resp: +shortness of breath with exertion or at rest.           productive cough, non-productive cough,  No-  coughing up of blood.              No-   change in color of mucus.  No- wheezing.   Skin: No-   rash or lesions. GI:  No-   heartburn, indigestion, abdominal pain, nausea, vomiting,  GU: MS:  No-   joint pain or swelling.   Neuro- :  Psych:  No- change in mood or affect. Some- depression or anxiety.  No memory loss.   Physical Exam  General- Alert, Oriented, Affect-appropriate, very pleasant,  Skin- rash-none, lesions- none, excoriation- none Lymphadenopathy- none Head- atraumatic            Eyes- Gross vision intact, PERRLA, conjunctivae clear secretions            Ears- Canals-, TMs ok,             Nose- clear , No- sores,  no -Septal dev, mucus, polyps, erosion, perforation.             Throat- Mallampati III , mucosa clear-not red , drainage- none, tonsils- atrophic   Neck- flexible , trachea midline, no stridor , thyroid + incision scar barely visible, carotid no bruit Chest - symmetrical excursion , unlabored           Heart/CV- RRR , no murmur , no gallop  , no rub, nl s1 s2                           -  JVD- none , edema- none, stasis changes- none, varices- none           Lung- clear, wheeze- none, cough- none, dullness-none, rub- none           Chest wall-  Abd- Br/ Gen/ Rectal- Not done, not indicated Extrem- cyanosis- none, clubbing, none, atrophy- none, strength- nl Neuro- grossly intact to observation

## 2019-05-28 NOTE — Patient Instructions (Signed)
Order- schedule PFT  Dx Dyspnea  Order- CXR  Dx dyspnea post covid  Sample Breztri inhaler   Inhale 2 puffs, then rinse mouth, twice daily  Please call as needed

## 2019-05-28 NOTE — Assessment & Plan Note (Signed)
Subtle and atypical dyspnea which fades as she warms up or gets into sustained activity and not noted at rest or supine. Exam unremarkable. Question if this is a result of covid lung. Plan- CXR, PFT, sample Breztri for trial

## 2019-05-28 NOTE — Assessment & Plan Note (Signed)
Chronic problem, better since covid infection, with some generalized fatigue. Plan- observation

## 2019-07-20 ENCOUNTER — Other Ambulatory Visit (HOSPITAL_COMMUNITY)
Admission: RE | Admit: 2019-07-20 | Discharge: 2019-07-20 | Disposition: A | Discharge status: Home / Self Care | Payer: Medicare HMO | Source: Ambulatory Visit | Attending: Internal Medicine | Admitting: Internal Medicine

## 2019-07-20 ENCOUNTER — Other Ambulatory Visit: Payer: Self-pay

## 2019-07-20 DIAGNOSIS — Z01812 Encounter for preprocedural laboratory examination: Secondary | ICD-10-CM | POA: Diagnosis present

## 2019-07-20 DIAGNOSIS — Z20822 Contact with and (suspected) exposure to covid-19: Secondary | ICD-10-CM | POA: Diagnosis not present

## 2019-07-21 LAB — SARS CORONAVIRUS 2 (TAT 6-24 HRS): SARS Coronavirus 2: NEGATIVE

## 2019-07-22 ENCOUNTER — Encounter: Payer: Self-pay | Admitting: Internal Medicine

## 2019-07-22 ENCOUNTER — Ambulatory Visit: Payer: Medicare HMO | Admitting: Internal Medicine

## 2019-07-22 ENCOUNTER — Other Ambulatory Visit: Payer: Self-pay

## 2019-07-22 VITALS — BP 130/82 | HR 68 | Ht 62.25 in | Wt 157.0 lb

## 2019-07-22 DIAGNOSIS — C73 Malignant neoplasm of thyroid gland: Secondary | ICD-10-CM

## 2019-07-22 DIAGNOSIS — E890 Postprocedural hypothyroidism: Secondary | ICD-10-CM | POA: Diagnosis not present

## 2019-07-22 LAB — TSH: TSH: 0.34 u[IU]/mL — ABNORMAL LOW (ref 0.35–4.50)

## 2019-07-22 LAB — T4, FREE: Free T4: 1.35 ng/dL (ref 0.60–1.60)

## 2019-07-22 LAB — VITAMIN D 25 HYDROXY (VIT D DEFICIENCY, FRACTURES): VITD: 33.36 ng/mL (ref 30.00–100.00)

## 2019-07-22 NOTE — Patient Instructions (Signed)
Please stop at the lab.  Please continue Synthroid 88 mcg daily.  Continue calcium 500 mg with dinner >> take this every day and switch to Tums.  Continue vitamin D 1000 units daily.  Take the thyroid hormone every day, with water, at least 30 minutes before breakfast, separated by at least 4 hours from: - acid reflux medications - calcium - iron - multivitamins  Please come back for a follow-up appointment in 6 months.

## 2019-07-22 NOTE — Progress Notes (Signed)
Patient ID: Teresa Molina, female   DOB: Feb 16, 1946, 74 y.o.   MRN: [id number]  This visit occurred during the SARS-CoV-2 public health emergency.  Safety protocols were in place, including screening questions prior to the visit, additional usage of staff PPE, and extensive cleaning of exam room while observing appropriate contact time as indicated for disinfecting solutions.   HPI  Teresa Molina is a 74 y.o.-year-old female, initially referred by Dr. Rush Farmer for f/u for papillary thyroid cancer and postsurgical hypothyroidism and hypocalcemia. Last visit 6 months ago. PCP: Dr. Shelia Media.  She had Covid19 in 01/2019.  She recovered well but still has some SOB >> will have respiratory testing (Dr. Annamaria Boots).  Reviewed history: Patient has been found to have an incidental thyroid nodule during a recent carotid ultrasound, on 03/10/2015. The nodule was seen in the left lobe and measured 0.9 x 0.8 x 0.9 centimeters.  Thyroid U/S (05/23/2015): Left lower pole solid hypoechoic nodule measures 10 x 8 x 8 mm, nonspecific. There appears to be minor associated microcalcification.    L nodule was small, but hypoechoic and with possible small calcifications >> I suggested FNA.  Adequacy Reason Satisfactory For Evaluation. Diagnosis THYROID, LEFT LOBE INFERIOR, FINE NEEDLE ASPIRATION (SPECIMEN 1 OF 1, COLLECTED ON 06/07/2015): POSITIVE FOR PAPILLARY THYROID CARCINOMA ([place] CATEGORY VI). [name] MD Pathologist, Electronic Signature (Case signed 06/08/2015) Specimen Clinical Information Left lower pole solid hypoechoic nodule measures 10 x 8 x 8 mm, nonspecific, There appears to be minor associated microcalcification Source Thyroid, Fine Needle Aspiration, Left Lobe Inferior, (Specimen 1 of 1, collected on 06/07/15 )  She had total thyroidectomy by Dr. Harlow Asa on 07/21/2015. Final pathology showed: Diagnosis Thyroid, thyroidectomy, total - PAPILLARY CARCINOMA, CLASSIC VARIANT, SPANNING  1.1 CM. - EXTRATHYROIDAL EXTENSION PRESENT. - RESECTION MARGINS ARE NEGATIVE. - NO LYMPHOVASCULAR INVASION. - PARATHYROID TISSUE PRESENT. - SEE ONCOLOGY TABLE. Microscopic Comment THYROID Specimen: Total thyroid. Procedure (including lymph node sampling if applicable): Total thyroidectomy. Specimen Integrity (intact/fragmented): Intact. Tumor focality: Unifocal. Dominant tumor: Maximum tumor size (cm): 1.1 cm. Tumor laterality: Left. Histologic type (including subtype and/or unique features as applicable): Papillary carcinoma, classic variant. Tumor capsule: Partial. Extrathyroidal extension: Present. Capsular invasion with degree of invasion if present: N/A. Margins: Negative. Lymphatic or vascular invasion: Not identified. Lymph nodes: # examined 0; # positive; 0 TNM code: pT3, pNX Non-neoplastic thyroid: Nodular hyperplasia, benign parathyroid tissue.  10/19/2015: RAI tx 123 mCi 10/28/2015: post-tx WBS: No metastasis 10/25/2016: Neck U/S:  no residual thyroid tissue or adenopathy  Her thyroglobulin levels were undetectable, as were her ATA antibodies: Lab Results  Component Value Date   THYROGLB <0.1 (L) 07/23/2018   THYROGLB <0.1 (L) 10/25/2017   THYROGLB 0.1 (L) 06/07/2017   THYROGLB <0.1 (L) 10/25/2016   THYROGLB 0.3 (L) 11/07/2015   THGAB <1 07/23/2018   THGAB <1 10/25/2017   THGAB <1 06/07/2017   THGAB <1 10/25/2016   THGAB <1 11/07/2015   Pt is on Synthroid d.a.w. 88 mcg daily, taken: - in am - fasting - at least 30 min from b'fast - + Ca with dinner - off and on - no Fe, MVI, PPIs, + H2 blocker with dinner - not on Biotin  Reviewed her TFTs: Lab Results  Component Value Date   TSH 0.54 01/23/2019   TSH 0.37 07/23/2018   TSH 0.96 10/25/2017   TSH 3.34 06/07/2017   TSH 2.29 10/25/2016   TSH 2.28 07/25/2016   TSH 1.64 06/01/2016   TSH 1.51 04/26/2016  TSH 0.74 02/23/2016   TSH 0.64 11/07/2015   FREET4 1.21 01/23/2019   FREET4 1.19 07/23/2018    FREET4 1.05 10/25/2017   FREET4 1.06 06/07/2017   FREET4 0.87 10/25/2016   FREET4 1.02 07/25/2016   FREET4 0.92 06/01/2016   FREET4 1.13 04/26/2016   FREET4 0.80 02/23/2016   FREET4 1.18 11/07/2015  04/05/2017: TSH 7.87 01/02/2016: TSH 0.22. 03/07/2015: TSH 3.22, fT4 0.99   She also has postsurgical hypocalcemia:  She had a Calcium of 7.4 postop >> started calcium carbonate but she came off afterwards.  We restarted this 07/2018: 500 mg with dinner.  Her, she tells me that she is taking this only 1-2 times a week.  Reviewed previous levels 01/21/2019: Ionized calcium 4.75 (4.8-5.6) 07/23/2018: Ionized calcium 4.66 (4.8-5.6) 04/08/2017: Calcium 8.3 Lab Results  Component Value Date   CALCIUM 8.8 11/07/2015   CALCIUM 7.4 (L) 07/22/2015   CALCIUM 9.4 07/13/2015   Her vitamin D level was normal but close to the lower limit of normal: Lab Results  Component Value Date   VD25OH 32.69 07/23/2018   I advised her to start 1000 units vitamin D daily.  Pt denies: - feeling nodules in neck - hoarseness - dysphagia - choking - SOB with lying down  No FH of thyroid ds. No FH of thyroid cancer. No h/o radiation tx to head or neck.  No herbal supplements. No Biotin use. No recent steroids use.   She has a history of HTN, HL, GERD, sleep apnea.  HbA1c was 6.8% in 06/2018. In 09/2018: 7.4%. Since then, she started to reduce carbs >> lost 15 lbs! HbA1c 12/2018: 6.4%. HbA1c 06/2019: 6.0%. Since last visit, weight is stable.  She continues 1000 units vitamin D daily.  ROS: Constitutional: no weight gain/+ weight loss, no fatigue, no subjective hyperthermia, no subjective hypothermia Eyes: no blurry vision, no xerophthalmia ENT: no sore throat, + see HPI Cardiovascular: no CP/+ SOB/no palpitations/no leg swelling Respiratory: no cough/+ SOB/no wheezing Gastrointestinal: no N/no V/no D/no C/no acid reflux Musculoskeletal: no muscle aches/no joint aches Skin: no rashes, no hair  loss Neurological: no tremors/no numbness/no tingling/no dizziness  I reviewed pt's medications, allergies, PMH, social hx, family hx, and changes were documented in the history of present illness. Otherwise, unchanged from my initial visit note.  Past Medical History:  Diagnosis Date  . Allergic conjunctivitis   . ALLERGIC RHINITIS   . Breast cyst    left breast - ultrasound benign 2010  . Cancer ([place])    Thyroid  . COVID-19   . Esophageal reflux   . Food allergy    angioedema > strawberries  . GERD (gastroesophageal reflux disease)   . Hypertension   . Insomnia   . OSA on CPAP    NPSG 08-03-09: AHI 17.6; CPAP 12/ AHI 0; PLMA- mild does not wear cpap   . PONV (postoperative nausea and vomiting)    Past Surgical History:  Procedure Laterality Date  . BREAST SURGERY  2013   Rt breast mass excision  . CATARACT EXTRACTION, BILATERAL    . CHOLECYSTECTOMY  1991  . DILATION AND CURETTAGE OF UTERUS    . ROTATOR CUFF REPAIR  2006  . squamous cell excised    . THYROIDECTOMY N/A 07/21/2015   Procedure: TOTAL THYROIDECTOMY;  Surgeon: Armandina Gemma, MD;  Location: [facility];  Service: General;  Laterality: N/A;  . TONSILLECTOMY  1952 - approximate  . TOTAL ABDOMINAL HYSTERECTOMY  1978   Social History   Social History  .  Marital Status: Divorced    Spouse Name: N/A  . Number of Children: 2   Occupational History  . 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 Smoking status: Never Smoker   . Smokeless tobacco: Never Used  . Alcohol Use: 0.0 oz/week    0 Standard drinks or equivalent per week     Comment: RARELY  . Drug Use: No   Current Outpatient Medications on File Prior to Visit  Medication Sig Dispense Refill  . calcium carbonate (OS-CAL) 600 MG tablet Take 600 mg by mouth daily.    . Cholecalciferol (VITAMIN D3) 3000 units TABS Take 1 tablet by mouth daily.    Marland Kitchen ibuprofen  (ADVIL,MOTRIN) 200 MG tablet Take 200 mg by mouth as needed for moderate pain. Reported on 05/11/2015    . labetalol (NORMODYNE) 200 MG tablet Take 1 tablet (200 mg total) by mouth 2 (two) times daily. 60 tablet 0  . OneTouch Delica Lancets 75F MISC USE AS DIRECTED TO CHECK BLOOD GLUCOSE DAILY E11.9    . ONETOUCH VERIO test strip USE AS DIRECTED TO CHECK BLOOD GLUCOSE DAILY E11.9    . SYNTHROID 88 MCG tablet TAKE 1 TABLET BY MOUTH EVERY DAY IN THE MORNING 90 tablet 2  . triamterene-hydrochlorothiazide (MAXZIDE-25) 37.5-25 MG per tablet Take 1 tablet by mouth daily. 30 tablet 0   No current facility-administered medications on file prior to visit.   Allergies  Allergen Reactions  . Amlodipine Swelling  . Diovan [Valsartan] Swelling  . Lipitor [Atorvastatin Calcium] Other (See Comments)    Caused muscle paralysis in legs  . Codeine Nausea And Vomiting  . Compazine [Prochlorperazine Edisylate] Other (See Comments)    Facial  muscle spasms   . Crestor [Rosuvastatin] Other (See Comments)    Severe joint pain.  . Reglan [Metoclopramide] Other (See Comments)    Facial muscle spasms  . Zoloft [Sertraline Hcl] Other (See Comments)    tremors  . Benadryl [Diphenhydramine Hcl] Palpitations   Family History  Problem Relation Age of Onset  . Other Father        brain tumor  . COPD Mother    PE: BP 130/82   Pulse 68   Ht 5' 2.25" (1.581 m) Comment: measured today without shoes  Wt 157 lb (71.2 kg)   SpO2 97%   BMI 28.49 kg/m  Body mass index is 28.49 kg/m. Wt Readings from Last 3 Encounters:  07/22/19 157 lb (71.2 kg)  05/28/19 161 lb 3.2 oz (73.1 kg)  04/28/19 157 lb (71.2 kg)   Constitutional: Slightly overweight, in NAD Eyes: PERRLA, EOMI, no exophthalmos ENT: moist mucous membranes, no neck masses palpated, no cervical lymphadenopathy Cardiovascular: RRR, No MRG Respiratory: CTA B Gastrointestinal: abdomen soft, NT, ND, BS+ Musculoskeletal: no deformities, strength intact in  all 4 Skin: moist, warm, no rashes Neurological: no tremor with outstretched hands, DTR normal in all 4   ASSESSMENT: 1. Papillary thyroid cancer  2. Postsurgical hypothyroidism  3.  Postsurgical hypocalcemia  PLAN: 1. PTC -Patient with small papillary thyroid cancer, 1.1 cm, unilateral.  There was no lymphovascular invasion, however, there was extrathyroidal extension.  Because of this, but considered intermediate risk rather than low risk.  She had RAI treatment with Thyrogen.  On the posttreatment whole-body scan, there were no metastases detected.  Her latest neck ultrasound was from 10/2016, showing no recurrence.  We will  repeat her neck ultrasound only if the thyroglobulin starts trending up or if she develops any neck masses. -At this visit, we will recheck her thyroglobulin and antithyroglobulin antibodies.  Both of these were checked a year ago and they were undetectable. -She prefers to come back in 6 months, rather than 1 year.    2. Postsurgical hypothyroidism - latest thyroid labs reviewed with pt >> normal: Lab Results  Component Value Date   TSH 0.54 01/23/2019   - she continues on Synthroid d.a.w. 88 mcg daily - pt feels good on this dose. - we discussed about taking the thyroid hormone every day, with water, >30 minutes before breakfast, separated by >4 hours from acid reflux medications, calcium, iron, multivitamins. Pt. is taking it correctly. - will check thyroid tests today: TSH and fT4 - If labs are abnormal, she will need to return for repeat TFTs in 1.5 months  3.  Postsurgical hypocalcemia -Asymptomatic -Her calcium level from 04/08/2017 was slightly low: 8.3 (normal higher than 8.5).  Last visit we repeated her ionized calcium and this was very slightly low.  She was asymptomatic.  We continued 500 mg calcium carbonate daily, starting 07/2018.  However, she is not taking this consistently as she does not like.  I advised her again to try to take it daily and  switch to Tums, which she did not do yet since last visit. -Her vitamin D level was normal a year ago close to the lower limit of normal.  I recommended to start 1000 units vitamin D daily.  We will repeat her vitamin D today. -We will repeat her ionized calcium level today  Component     Latest Ref Rng & Units 07/22/2019  Thyroglobulin     ng/mL <0.1 (L)  Comment        TSH     0.35 - 4.50 uIU/mL 0.34 (L)  T4,Free(Direct)     0.60 - 1.60 ng/dL 1.35  Thyroglobulin Ab     < or = 1 IU/mL <1  VITD     30.00 - 100.00 ng/mL 33.36  Calcium Ionized     4.8 - 5.6 mg/dL 4.27 (L)   Thyroglobulin undetectable, as are her antithyroglobulin antibodies.  TSH is slightly low.  We will decrease the dose of Synthroid to 88 alternating with 75 mcg every other day and repeat her TFTs in 1.5 months. Her calcium is slightly lower so I will advise her to take a Tums tablet with dinner. Vitamin D level is normal.  Philemon Kingdom, MD PhD [facility] End[facility]

## 2019-07-23 ENCOUNTER — Encounter: Payer: Self-pay | Admitting: Internal Medicine

## 2019-07-23 ENCOUNTER — Ambulatory Visit: Payer: Medicare HMO | Admitting: Internal Medicine

## 2019-07-23 ENCOUNTER — Ambulatory Visit (INDEPENDENT_AMBULATORY_CARE_PROVIDER_SITE_OTHER): Payer: Medicare HMO | Admitting: Internal Medicine

## 2019-07-23 DIAGNOSIS — R0609 Other forms of dyspnea: Secondary | ICD-10-CM

## 2019-07-23 DIAGNOSIS — U071 COVID-19: Secondary | ICD-10-CM | POA: Diagnosis not present

## 2019-07-23 DIAGNOSIS — F5101 Primary insomnia: Secondary | ICD-10-CM

## 2019-07-23 LAB — PULMONARY FUNCTION TEST
DL/VA % pred: 102 %
DL/VA: 4.28 ml/min/mmHg/L
DLCO cor % pred: 99 %
DLCO cor: 17.88 ml/min/mmHg
DLCO unc % pred: 99 %
DLCO unc: 17.88 ml/min/mmHg
FEF 25-75 Post: 2 L/sec
FEF 25-75 Pre: 1.67 L/sec
FEF2575-%Change-Post: 20 %
FEF2575-%Pred-Post: 122 %
FEF2575-%Pred-Pre: 101 %
FEV1-%Change-Post: 5 %
FEV1-%Pred-Post: 96 %
FEV1-%Pred-Pre: 91 %
FEV1-Post: 1.91 L
FEV1-Pre: 1.81 L
FEV1FVC-%Change-Post: -2 %
FEV1FVC-%Pred-Pre: 105 %
FEV6-%Change-Post: 8 %
FEV6-%Pred-Post: 98 %
FEV6-%Pred-Pre: 90 %
FEV6-Post: 2.47 L
FEV6-Pre: 2.28 L
FEV6FVC-%Change-Post: 0 %
FEV6FVC-%Pred-Post: 105 %
FEV6FVC-%Pred-Pre: 105 %
FVC-%Change-Post: 8 %
FVC-%Pred-Post: 93 %
FVC-%Pred-Pre: 86 %
FVC-Post: 2.47 L
FVC-Pre: 2.28 L
Post FEV1/FVC ratio: 77 %
Post FEV6/FVC ratio: 100 %
Pre FEV1/FVC ratio: 80 %
Pre FEV6/FVC Ratio: 100 %
RV % pred: 101 %
RV: 2.18 L
TLC % pred: 95 %
TLC: 4.53 L

## 2019-07-23 LAB — THYROGLOBULIN LEVEL: Thyroglobulin: <0.1 ng/mL — ABNORMAL LOW

## 2019-07-23 LAB — THYROGLOBULIN ANTIBODY: Thyroglobulin Ab: <1 IU/mL (ref <=1)

## 2019-07-23 LAB — CALCIUM, IONIZED: Calcium, Ion: 4.27 mg/dL — ABNORMAL LOW (ref 4.8–5.6)

## 2019-07-23 MED ORDER — SYNTHROID 75 MCG PO TABS: 75.0000 ug | ORAL_TABLET | ORAL | 2 refills | Status: DC

## 2019-07-23 MED ORDER — SYNTHROID 88 MCG PO TABS: ORAL_TABLET | ORAL | 3 refills | Status: DC

## 2019-07-23 NOTE — Patient Instructions (Signed)
Suggest you look into Som Sleep drink   We will contact you about the formal reading of your pulmonary function test

## 2019-07-23 NOTE — Progress Notes (Addendum)
HPI female never smoker followed for allergic rhinitis/conjunctivitis, insomnia, OSA/failed CPAP and Provent nasal valves, complicated by history of migraine, hypertension, GERD, thyroid cancer/ sgy/ RAI PFT 07/23/19- WNL- FEV1/FVC 0.77, TLC 95%, DLCO 99%. --------------------------------------------------------------------------------------------------    05/28/19- 74 year old female never smoker followed for allergic rhinitis/conjunctivitis, Insomnia, OSA/failed CPAP and Provent nasal valves, complicated by history of migraine, hypertension, GERD, Thyroid Cancer/ sgy/ RAI, Covid infection 01/2019, -----Wants to discuss since covid infection in September 2020. Stable persistent note of shortness of breath as she first gets up and starts moving, but not with sustained activity such as walking. Onset with covid infection. Denies palpitation, edema, cough, wheeze. Also more prone to fatigue/ sleepiness.  07/23/19- 74 year old female never smoker followed for allergic rhinitis/conjunctivitis, Insomnia, OSA/failed CPAP and Provent nasal valves, complicated by history of migraine, hypertension, GERD, Thyroid Cancer/ sgy/ RAI, Covid infection 01/2019, -----f/u PFT/ Dyspnea when standing. Post COVID.  Still sleeping very well since Covid infection. We had given sample Breztri for trial- not sure it made much difference. Not noticing other changes after Covid infection. Waiting eligibility for her Covax. PFT 07/23/19- WNL- FEV1/FVC 0.77, TLC 95%, DLCO 99%. CXR 05/28/19-  The heart size and mediastinal contours are within normal limits. Both lungs are clear. The visualized skeletal structures are unremarkable. IMPRESSION: No active cardiopulmonary disease.  Review of Systems-see HPI + = positive Constitutional:     +weight loss-diet, night sweats, fevers, chills, fatigue, lassitude. + insomnia HEENT:   +headaches, difficulty swallowing, tooth/dental problems, +sore throat,       No- sneezing, itching,  no-ears itch,+nasal congestion, post nasal drip,  CV:  No-   chest pain, orthopnea, PND, swelling in lower extremities, anasarca, dizziness, palpitations Resp: +shortness of breath with exertion or at rest.           productive cough, non-productive cough,  No-  coughing up of blood.              No-   change in color of mucus.  No- wheezing.   Skin: No-   rash or lesions. GI:  No-   heartburn, indigestion, abdominal pain, nausea, vomiting,  GU: MS:  No-   joint pain or swelling.   Neuro- :  Psych:  No- change in mood or affect. Some- depression or anxiety.  No memory loss.   Physical Exam  General- Alert, Oriented, Affect-appropriate, very pleasant,  Skin- rash-none, lesions- none, excoriation- none Lymphadenopathy- none Head- atraumatic            Eyes- Gross vision intact, PERRLA, conjunctivae clear secretions            Ears- Canals-, TMs ok,             Nose- clear , No- sores,  no -Septal dev, mucus, polyps, erosion, perforation.             Throat- Mallampati III , mucosa clear-not red , drainage- none, tonsils- atrophic   Neck- flexible , trachea midline, no stridor , thyroid + incision scar barely visible, carotid no bruit Chest - symmetrical excursion , unlabored           Heart/CV- RRR , no murmur , no gallop  , no rub, nl s1 s2                           - JVD- none , edema- none, stasis changes- none, varices- none           Lung- clear,  wheeze- none, cough- none, dullness-none, rub- none           Chest wall-  Abd- Br/ Gen/ Rectal- Not done, not indicated Extrem- cyanosis- none, clubbing, none, atrophy- none, strength- nl Neuro- grossly intact to observation

## 2019-07-23 NOTE — Progress Notes (Signed)
PFT done today. 

## 2019-08-04 ENCOUNTER — Encounter: Payer: Self-pay | Admitting: Internal Medicine

## 2019-08-04 NOTE — Assessment & Plan Note (Signed)
Only residual is that chronic insomnia problem seems to have resolved. She plans vaccine when eligible now.

## 2019-08-04 NOTE — Assessment & Plan Note (Signed)
Chronic insomnia, finally resolved after Covid infection winter of 2020-21. She still has stresses related to job with [employer].  Discussed good sleep habits.

## 2019-09-03 ENCOUNTER — Other Ambulatory Visit: Payer: Self-pay

## 2019-09-03 ENCOUNTER — Ambulatory Visit: Payer: Medicare HMO | Admitting: Internal Medicine

## 2019-09-03 ENCOUNTER — Encounter: Payer: Self-pay | Admitting: Internal Medicine

## 2019-09-03 DIAGNOSIS — G4733 Obstructive sleep apnea (adult) (pediatric): Secondary | ICD-10-CM

## 2019-09-03 DIAGNOSIS — F5101 Primary insomnia: Secondary | ICD-10-CM | POA: Diagnosis not present

## 2019-09-03 DIAGNOSIS — I6523 Occlusion and stenosis of bilateral carotid arteries: Secondary | ICD-10-CM

## 2019-09-03 NOTE — Progress Notes (Signed)
HPI female never smoker followed for allergic rhinitis/conjunctivitis, insomnia, OSA/failed CPAP and Provent nasal valves, complicated by history of migraine, hypertension, GERD, thyroid cancer/ sgy/ RAI PFT 07/23/19- WNL- FEV1/FVC 0.77, TLC 95%, DLCO 99%. -------------------------------------------------------------------------------------------------- .  07/23/19- 74 year old female never smoker followed for allergic rhinitis/conjunctivitis, Insomnia, OSA/failed CPAP and Provent nasal valves, complicated by history of migraine, hypertension, GERD, Thyroid Cancer/ sgy/ RAI, Covid infection 01/2019, -----f/u PFT/ Dyspnea when standing. Post COVID.  Still sleeping very well since Covid infection. We had given sample Breztri for trial- not sure it made much difference. Not noticing other changes after Covid infection. Waiting eligibility for her Covax. PFT 07/23/19- WNL- FEV1/FVC 0.77, TLC 95%, DLCO 99%. CXR 05/28/19-  The heart size and mediastinal contours are within normal limits. Both lungs are clear. The visualized skeletal structures are unremarkable. IMPRESSION: No active cardiopulmonary disease.  09/03/19- 74 year old female never smoker followed for allergic rhinitis/conjunctivitis, Insomnia, OSA/failed CPAP and Provent nasal valves, complicated by history of migraine, hypertension, GERD, Thyroid Cancer/ sgy/ RAI, Covid infection 01/2019, -----f/u Primary insomnia/Post Covid 01/2019. Breathing is at her baseline .  Dyspnea post Covid infection has gradually improved. Chronic insomnia had improved with Covid infection and still remains better than baseline. She is pending further eval of carotid artery disease.  Review of Systems-see HPI + = positive Constitutional:     +weight loss-diet, night sweats, fevers, chills, fatigue, lassitude. + insomnia HEENT:   +headaches, difficulty swallowing, tooth/dental problems, +sore throat,       No- sneezing, itching, no-ears itch,+nasal congestion,  post nasal drip,  CV:  No-   chest pain, orthopnea, PND, swelling in lower extremities, anasarca, dizziness, palpitations Resp: +shortness of breath with exertion or at rest.           productive cough, non-productive cough,  No-  coughing up of blood.              No-   change in color of mucus.  No- wheezing.   Skin: No-   rash or lesions. GI:  No-   heartburn, indigestion, abdominal pain, nausea, vomiting,  GU: MS:  No-   joint pain or swelling.   Neuro- :  Psych:  No- change in mood or affect. Some- depression or anxiety.  No memory loss.   Physical Exam  General- Alert, Oriented, Affect-appropriate, very pleasant, has kept weight down. Skin- rash-none, lesions- none, excoriation- none Lymphadenopathy- none Head- atraumatic            Eyes- Gross vision intact, PERRLA, conjunctivae clear secretions            Ears- Canals-, TMs ok,             Nose- clear , No- sores,  no -Septal dev, mucus, polyps, erosion, perforation.             Throat- Mallampati III , mucosa clear-not red , drainage- none, tonsils- atrophic   Neck- flexible , trachea midline, no stridor , thyroid + incision scar barely visible, carotid+ bilateral bruit Chest - symmetrical excursion , unlabored           Heart/CV- RRR , no murmur , no gallop  , no rub, nl s1 s2                           - JVD- none , edema- none, stasis changes- none, varices- none           Lung- clear, wheeze- none, cough- none,  dullness-none, rub- none           Chest wall-  Abd- Br/ Gen/ Rectal- Not done, not indicated Extrem- cyanosis- none, clubbing, none, atrophy- none, strength- nl Neuro- grossly intact to observation

## 2019-09-03 NOTE — Patient Instructions (Signed)
We can continue current efforts  Please call if we can help

## 2019-09-11 ENCOUNTER — Other Ambulatory Visit (INDEPENDENT_AMBULATORY_CARE_PROVIDER_SITE_OTHER): Payer: Medicare HMO

## 2019-09-11 ENCOUNTER — Other Ambulatory Visit: Payer: Self-pay

## 2019-09-11 ENCOUNTER — Encounter: Payer: Self-pay | Admitting: Internal Medicine

## 2019-09-11 DIAGNOSIS — E890 Postprocedural hypothyroidism: Secondary | ICD-10-CM | POA: Diagnosis not present

## 2019-09-11 LAB — TSH: TSH: 0.73 u[IU]/mL (ref 0.35–4.50)

## 2019-09-11 LAB — T4, FREE: Free T4: 1.11 ng/dL (ref 0.60–1.60)

## 2019-09-12 LAB — CALCIUM, IONIZED: Calcium, Ion: 4.77 mg/dL — ABNORMAL LOW (ref 4.8–5.6)

## 2019-09-15 DIAGNOSIS — I779 Disorder of arteries and arterioles, unspecified: Secondary | ICD-10-CM | POA: Insufficient documentation

## 2019-09-15 NOTE — Assessment & Plan Note (Signed)
Weight loss seems to have helped.

## 2019-09-15 NOTE — Assessment & Plan Note (Signed)
Stressful job. Interesting that this improved with post-covid fatigue.  Plan- continue attention to good sleep habits.

## 2019-09-15 NOTE — Assessment & Plan Note (Signed)
Discovered during evaluation for thyroid cancer. Being followed.

## 2019-09-21 ENCOUNTER — Other Ambulatory Visit: Payer: Self-pay | Admitting: *Deleted

## 2019-09-21 ENCOUNTER — Telehealth (HOSPITAL_COMMUNITY): Payer: Self-pay

## 2019-09-21 DIAGNOSIS — I6523 Occlusion and stenosis of bilateral carotid arteries: Secondary | ICD-10-CM

## 2019-09-21 NOTE — Telephone Encounter (Signed)

## 2019-09-22 ENCOUNTER — Ambulatory Visit (INDEPENDENT_AMBULATORY_CARE_PROVIDER_SITE_OTHER): Payer: Medicare HMO | Admitting: Vascular Surgery

## 2019-09-22 ENCOUNTER — Encounter: Payer: Self-pay | Admitting: Vascular Surgery

## 2019-09-22 ENCOUNTER — Ambulatory Visit (HOSPITAL_COMMUNITY)
Admission: RE | Admit: 2019-09-22 | Discharge: 2019-09-22 | Disposition: A | Discharge status: Home / Self Care | Payer: Medicare HMO | Source: Ambulatory Visit | Attending: Vascular Surgery | Admitting: Vascular Surgery

## 2019-09-22 ENCOUNTER — Other Ambulatory Visit: Payer: Self-pay

## 2019-09-22 VITALS — BP 133/79 | HR 68 | Resp 20 | Ht 62.5 in | Wt 163.0 lb

## 2019-09-22 DIAGNOSIS — I6523 Occlusion and stenosis of bilateral carotid arteries: Secondary | ICD-10-CM

## 2019-09-22 NOTE — Progress Notes (Signed)
[facility] [place]  Patient name: Teresa Molina MRN: [id number] DOB: 11/04/1945 Sex: female  REASON FOR CONSULT: Evaluation carotid narrowing on duplex  HPI: Teresa Molina is a 74 y.o. female, who is here today for evaluation of potential carotid disease.  She was found to have a carotid bruit and underwent carotid duplex which suggested a possible moderate stenosis in the left carotid.  She is here today for further discussion of this.  I have her study from outlying facility and we repeated this today.  She has no prior history of amaurosis fugax, transient ischemic attack, aphasia or stroke.  She does have history of thyroid cancer.  She also is a concern regarding telangiectasia and varicosities on her left leg with telangiectasias only on the right leg.  No history of DVT  Past Medical History:  Diagnosis Date  . Allergic conjunctivitis   . ALLERGIC RHINITIS   . Breast cyst    left breast - ultrasound benign 2010  . Cancer ([place])    Thyroid  . COVID-19   . Esophageal reflux   . Food allergy    angioedema > strawberries  . GERD (gastroesophageal reflux disease)   . Hypertension   . Insomnia   . OSA on CPAP    NPSG 08-03-09: AHI 17.6; CPAP 12/ AHI 0; PLMA- mild does not wear cpap   . PONV (postoperative nausea and vomiting)     Family History  Problem Relation Age of Onset  . Other Father        brain tumor  . COPD Mother     SOCIAL HISTORY: Social History   Socioeconomic History  . Marital status: Divorced    Spouse name: Not on file  . Number of children: 2  . Years of education: college  . Highest education level: Not on file  Occupational History  . 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 Smoking status: Never Smoker  . Smokeless tobacco: Never Used  Substance and Sexual Activity  . Alcohol use: Yes   Alcohol/week: 0.0 standard drinks    Comment: occasional   . Drug use: No  . Sexual activity: Not Currently    Comment: 1st intercourse 74 yo-More than 5 partners  Other Topics Concern  . Not on file  Social History Narrative   Denies regular caffeine use    Social Determinants of Health   Financial Resource Strain:   . Difficulty of Paying Living Expenses:   Food Insecurity:   . Worried About Charity fundraiser in the Last Year:   . Arboriculturist in the Last Year:   Transportation Needs:   . Film/video editor (Medical):   Marland Kitchen Lack of Transportation (Non-Medical):   Physical Activity:   . Days of Exercise per Week:   . Minutes of Exercise per Session:   Stress:   . Feeling of Stress :   Social Connections:   . Frequency of Communication with Friends and Family:   . Frequency of Social Gatherings with Friends and Family:   . Attends Religious Services:   . Active Member of Clubs or Organizations:   . Attends Archivist Meetings:   Marland Kitchen Marital Status:   Intimate Partner Violence:   . Fear of Current or Ex-Partner:   . Emotionally Abused:   Marland Kitchen Physically Abused:   . Sexually  Abused:     Allergies  Allergen Reactions  . Amlodipine Swelling  . Diovan [Valsartan] Swelling  . Lipitor [Atorvastatin Calcium] Other (See Comments)    Caused muscle paralysis in legs  . Codeine Nausea And Vomiting  . Compazine [Prochlorperazine Edisylate] Other (See Comments)    Facial  muscle spasms   . Crestor [Rosuvastatin] Other (See Comments)    Severe joint pain.  . Reglan [Metoclopramide] Other (See Comments)    Facial muscle spasms  . Zoloft [Sertraline Hcl] Other (See Comments)    tremors  . Benadryl [Diphenhydramine Hcl] Palpitations    Current Outpatient Medications  Medication Sig Dispense Refill  . calcium carbonate (OS-CAL) 600 MG tablet Take 600 mg by mouth daily.    . Cholecalciferol (VITAMIN D3) 3000 units TABS Take 1 tablet by mouth daily.    Marland Kitchen ibuprofen  (ADVIL,MOTRIN) 200 MG tablet Take 200 mg by mouth as needed for moderate pain. Reported on 05/11/2015    . labetalol (NORMODYNE) 200 MG tablet Take 1 tablet (200 mg total) by mouth 2 (two) times daily. 60 tablet 0  . OneTouch Delica Lancets 99991111 MISC USE AS DIRECTED TO CHECK BLOOD GLUCOSE DAILY E11.9    . ONETOUCH VERIO test strip USE AS DIRECTED TO CHECK BLOOD GLUCOSE DAILY E11.9    . SYNTHROID 75 MCG tablet Take 1 tablet (75 mcg total) by mouth every other day. 30 tablet 2  . SYNTHROID 88 MCG tablet Every other day 45 tablet 3  . triamterene-hydrochlorothiazide (MAXZIDE-25) 37.5-25 MG per tablet Take 1 tablet by mouth daily. 30 tablet 0   No current facility-administered medications for this visit.    REVIEW OF SYSTEMS:  [X]  denotes positive finding, [ ]  denotes negative finding Cardiac  Comments:  Chest pain or chest pressure:    Shortness of breath upon exertion:    Short of breath when lying flat:    Irregular heart rhythm:        Vascular    Pain in calf, thigh, or hip brought on by ambulation:    Pain in feet at night that wakes you up from your sleep:     Blood clot in your veins:    Leg swelling:         Pulmonary    Oxygen at home:    Productive cough:     Wheezing:         Neurologic    Sudden weakness in arms or legs:     Sudden numbness in arms or legs:     Sudden onset of difficulty speaking or slurred speech:    Temporary loss of vision in one eye:     Problems with dizziness:         Gastrointestinal    Blood in stool:     Vomited blood:         Genitourinary    Burning when urinating:     Blood in urine:        Psychiatric    Major depression:         Hematologic    Bleeding problems:    Problems with blood clotting too easily:        Skin    Rashes or ulcers:        Constitutional    Fever or chills:      PHYSICAL EXAM: Vitals:   09/22/19 1240 09/22/19 1242  BP: (!) 122/59 133/79  Pulse: 68   Resp: 20   SpO2: 97%  Weight: 163 lb  (73.9 kg)   Height: 5' 2.5" (1.588 m)     GENERAL: The patient is a well-nourished female, in no acute distress. The vital signs are documented above. CARDIOVASCULAR: I do not appreciate carotid bruits today.  She has 2+ radial and 2+ dorsalis pedis pulses bilaterally.  She does have varicose veins on the medial aspect of her left thigh and left calf.  She has pronounced telangiectasia most specifically in the right pretibial area. PULMONARY: There is good air exchange  ABDOMEN: Soft and non-tender  MUSCULOSKELETAL: There are no major deformities or cyanosis. NEUROLOGIC: No focal weakness or paresthesias are detected. SKIN: There are no ulcers or rashes noted. PSYCHIATRIC: The patient has a normal affect.  DATA:  Carotid duplex in our office was reviewed with the patient.  This shows no evidence of disease in the right carotid system and mild 1 to 39% stenosis in the left carotid.  MEDICAL ISSUES: I discussed these findings at length with patient.  I explained that our interpretation is somewhat less than that at the outlying lab.  I explained that even if her stenosis was at the degree of the outlying lab which was 50 to 69% stenosis in her left carotid artery that we would still recommend observation only.  We will see her again in 1 year with repeat carotid duplex.  She understands that she needs to present immediately to the emergency room should she have any neurologic deficits.   Rosetta Posner, MD FACS [facility] Tel [phone] Pager [phone]

## 2019-11-10 ENCOUNTER — Ambulatory Visit: Payer: Medicare HMO | Admitting: Internal Medicine

## 2019-11-29 ENCOUNTER — Other Ambulatory Visit: Payer: Self-pay | Admitting: Internal Medicine

## 2019-12-08 ENCOUNTER — Other Ambulatory Visit: Payer: Self-pay | Admitting: Internal Medicine

## 2019-12-14 ENCOUNTER — Ambulatory Visit: Payer: Medicare HMO | Admitting: Internal Medicine

## 2019-12-14 ENCOUNTER — Encounter: Payer: Self-pay | Admitting: Internal Medicine

## 2019-12-14 ENCOUNTER — Other Ambulatory Visit: Payer: Self-pay

## 2019-12-14 DIAGNOSIS — F5101 Primary insomnia: Secondary | ICD-10-CM | POA: Diagnosis not present

## 2019-12-14 DIAGNOSIS — G4733 Obstructive sleep apnea (adult) (pediatric): Secondary | ICD-10-CM | POA: Diagnosis not present

## 2019-12-14 NOTE — Patient Instructions (Signed)
We can continue current meds   I do recommend you go ahead with the covid vaccine

## 2019-12-14 NOTE — Progress Notes (Signed)
HPI female never smoker followed for allergic rhinitis/conjunctivitis, insomnia, OSA/failed CPAP and Provent nasal valves, complicated by history of migraine, hypertension, GERD, thyroid cancer/ sgy/ RAI PFT 07/23/19- WNL- FEV1/FVC 0.77, TLC 95%, DLCO 99%. -------------------------------------------------------------------------------------------------- =.  09/03/19- 74 year old female never smoker followed for allergic rhinitis/conjunctivitis, Insomnia, OSA/failed CPAP and Provent nasal valves, complicated by history of migraine, hypertension, GERD, Thyroid Cancer/ sgy/ RAI, Covid infection 01/2019, -----f/u Primary insomnia/Post Covid 01/2019. Breathing is at her baseline .  Dyspnea post Covid infection has gradually improved. Chronic insomnia had improved with Covid infection and still remains better than baseline. She is pending further eval of carotid artery disease.  12/14/19- 74 year old female never smoker followed for allergic rhinitis/conjunctivitis, Insomnia, OSA/failed CPAP and Provent nasal valves, complicated by history of migraine, hypertension, GERD, Thyroid Cancer/ sgy/ RAI, Covid infection 01/2019, Chronic insomnia, unsuccessfull with multiple treatments. Mild OSA and intolerant of CPAP, unwilling to try others. Still sleeping better than whe was before Covid. Less stress now, finally retired from [employer]olic[employer].  Discussed general health. It has helped her more to tlak about her sleep issues, than to intervene.  Being followed for carotid stenosis.  I recommend she still get Covid vaccine after having infection last year.   Review of Systems-see HPI + = positive Constitutional:     +weight loss-diet, night sweats, fevers, chills, fatigue, lassitude. + insomnia HEENT:   +headaches, difficulty swallowing, tooth/dental problems, +sore throat,       No- sneezing, itching, no-ears itch,+nasal congestion, post nasal drip,  CV:  No-   chest pain, orthopnea, PND, swelling in lower  extremities, anasarca, dizziness, palpitations Resp: +shortness of breath with exertion or at rest.           productive cough, non-productive cough,  No-  coughing up of blood.              No-   change in color of mucus.  No- wheezing.   Skin: No-   rash or lesions. GI:  No-   heartburn, indigestion, abdominal pain, nausea, vomiting,  GU: MS:  No-   joint pain or swelling.   Neuro- :  Psych:  No- change in mood or affect. Some- depression or anxiety.  No memory loss.   Physical Exam  General- Alert, Oriented, Affect-appropriate, very pleasant, has kept weight down. Skin- rash-none, lesions- none, excoriation- none Lymphadenopathy- none Head- atraumatic            Eyes- Gross vision intact, PERRLA, conjunctivae clear secretions            Ears- Canals-, TMs ok,             Nose- clear , No- sores,  no -Septal dev, mucus, polyps, erosion, perforation.             Throat- Mallampati III , mucosa clear-not red , drainage- none, tonsils- atrophic   Neck- flexible , trachea midline, no stridor , thyroid + incision scar barely visible, carotid+ bilateral bruit Chest - symmetrical excursion , unlabored           Heart/CV- RRR , no murmur , no gallop  , no rub, nl s1 s2                           - JVD- none , edema- none, stasis changes- none, varices- none           Lung- clear, wheeze- none, cough- none, dullness-none, rub- none  Chest wall-  Abd- Br/ Gen/ Rectal- Not done, not indicated Extrem- cyanosis- none, clubbing, none, atrophy- none, strength- nl Neuro- grossly intact to observation

## 2019-12-27 NOTE — Assessment & Plan Note (Signed)
Continues attention to sleep hygiene. Reduced stress has helped. Not clear why she sleeps better after Covd infection

## 2019-12-27 NOTE — Assessment & Plan Note (Signed)
She lost weight and has chosen not to treat.

## 2020-01-27 ENCOUNTER — Encounter: Payer: Self-pay | Admitting: Internal Medicine

## 2020-01-27 ENCOUNTER — Ambulatory Visit: Payer: Medicare HMO | Admitting: Internal Medicine

## 2020-01-27 ENCOUNTER — Other Ambulatory Visit: Payer: Self-pay

## 2020-01-27 VITALS — BP 128/76 | HR 68 | Ht 63.0 in | Wt 167.0 lb

## 2020-01-27 DIAGNOSIS — E890 Postprocedural hypothyroidism: Secondary | ICD-10-CM

## 2020-01-27 DIAGNOSIS — C73 Malignant neoplasm of thyroid gland: Secondary | ICD-10-CM

## 2020-01-27 LAB — TSH: TSH: 1.15 u[IU]/mL (ref 0.35–4.50)

## 2020-01-27 LAB — T4, FREE: Free T4: 1.13 ng/dL (ref 0.60–1.60)

## 2020-01-27 NOTE — Progress Notes (Signed)
Patient ID: Teresa Molina, female   DOB: [birth year], 74 y.o.   MRN: [id number]  This visit occurred during the SARS-CoV-2 public health emergency.  Safety protocols were in place, including screening questions prior to the visit, additional usage of staff PPE, and extensive cleaning of exam room while observing appropriate contact time as indicated for disinfecting solutions.   HPI  Teresa Molina is a 74 y.o.-year-old female, initially referred by Dr. Rush Farmer for f/u for papillary thyroid cancer and postsurgical hypothyroidism and hypocalcemia. Last visit 6 months ago. PCP: Dr. Shelia Media.  She had COVID-19 in 01/2019.  She recovered but still had some shortness of breath at last visit and had respiratory testing with Dr. Annamaria Boots. This was normal. SOB resolved now.  Reviewed her cancer history: Patient has been found to have an incidental thyroid nodule during a recent carotid ultrasound, on 03/10/2015. The nodule was seen in the left lobe and measured 0.9 x 0.8 x 0.9 centimeters.  Thyroid U/S (05/23/2015): Left lower pole solid hypoechoic nodule measures 10 x 8 x 8 mm, nonspecific. There appears to be minor associated microcalcification.    L nodule was small, but hypoechoic and with possible small calcifications >> I suggested FNA.  Adequacy Reason Satisfactory For Evaluation. Diagnosis THYROID, LEFT LOBE INFERIOR, FINE NEEDLE ASPIRATION (SPECIMEN 1 OF 1, COLLECTED ON 06/07/2015): POSITIVE FOR PAPILLARY THYROID CARCINOMA ([place] CATEGORY VI). [name] MD Pathologist, Electronic Signature (Case signed 06/08/2015) Specimen Clinical Information Left lower pole solid hypoechoic nodule measures 10 x 8 x 8 mm, nonspecific, There appears to be minor associated microcalcification Source Thyroid, Fine Needle Aspiration, Left Lobe Inferior, (Specimen 1 of 1, collected on 06/07/15 )  She had total thyroidectomy by Dr. Harlow Asa on 07/21/2015. Final pathology showed: Diagnosis Thyroid,  thyroidectomy, total - PAPILLARY CARCINOMA, CLASSIC VARIANT, SPANNING 1.1 CM. - EXTRATHYROIDAL EXTENSION PRESENT. - RESECTION MARGINS ARE NEGATIVE. - NO LYMPHOVASCULAR INVASION. - PARATHYROID TISSUE PRESENT. - SEE ONCOLOGY TABLE. Microscopic Comment THYROID Specimen: Total thyroid. Procedure (including lymph node sampling if applicable): Total thyroidectomy. Specimen Integrity (intact/fragmented): Intact. Tumor focality: Unifocal. Dominant tumor: Maximum tumor size (cm): 1.1 cm. Tumor laterality: Left. Histologic type (including subtype and/or unique features as applicable): Papillary carcinoma, classic variant. Tumor capsule: Partial. Extrathyroidal extension: Present. Capsular invasion with degree of invasion if present: N/A. Margins: Negative. Lymphatic or vascular invasion: Not identified. Lymph nodes: # examined 0; # positive; 0 TNM code: pT3, pNX Non-neoplastic thyroid: Nodular hyperplasia, benign parathyroid tissue.  10/19/2015: RAI tx 123 mCi 10/28/2015: post-tx WBS: No metastasis 10/25/2016: Neck U/S:  no residual thyroid tissue or adenopathy  Thyroglobulin levels and ATA antibodies are undetectable: Lab Results  Component Value Date   THYROGLB <0.1 (L) 07/22/2019   THYROGLB <0.1 (L) 07/23/2018   THYROGLB <0.1 (L) 10/25/2017   THYROGLB 0.1 (L) 06/07/2017   THYROGLB <0.1 (L) 10/25/2016   THYROGLB 0.3 (L) 11/07/2015   THGAB <1 07/22/2019   THGAB <1 07/23/2018   THGAB <1 10/25/2017   THGAB <1 06/07/2017   THGAB <1 10/25/2016   THGAB <1 11/07/2015   Pt is on Synthroid d.a.w. 88 alternating with 75 mcg every other day: - in am - fasting - at least 30 min from b'fast - no Fe, MVI, PPIs - + calcium with dinner, stopped H2 blocker with dinner (only rarely) - not on Biotin  Reviewed her TFTs: Lab Results  Component Value Date   TSH 0.73 09/11/2019   TSH 0.34 (L) 07/22/2019   TSH 0.54 01/23/2019   TSH  0.37 07/23/2018   TSH 0.96 10/25/2017   TSH 3.34  06/07/2017   TSH 2.29 10/25/2016   TSH 2.28 07/25/2016   TSH 1.64 06/01/2016   TSH 1.51 04/26/2016   FREET4 1.11 09/11/2019   FREET4 1.35 07/22/2019   FREET4 1.21 01/23/2019   FREET4 1.19 07/23/2018   FREET4 1.05 10/25/2017   FREET4 1.06 06/07/2017   FREET4 0.87 10/25/2016   FREET4 1.02 07/25/2016   FREET4 0.92 06/01/2016   FREET4 1.13 04/26/2016  04/05/2017: TSH 7.87 01/02/2016: TSH 0.22. 03/07/2015: TSH 3.22, fT4 0.99   She has postsurgical hypocalcemia:  She had a Calcium of 7.4 postop >> started calcium carbonate but she came off afterwards.  We restarted this 07/2018: 500 mg with dinner.  At last visit she was not taking this consistently, only 1-2 times a week.  I advised her to take it daily, since her ionized calcium was low.  Reviewed calcium levels: 07/22/2019: Ionized calcium 4.27 01/21/2019: Ionized calcium 4.75 (4.8-5.6) 07/23/2018: Ionized calcium 4.66 (4.8-5.6) 04/08/2017: Calcium 8.3 Lab Results  Component Value Date   CALCIUM 8.8 11/07/2015   CALCIUM 7.4 (L) 07/22/2015   CALCIUM 9.4 07/13/2015   Vitamin D level is normal: Lab Results  Component Value Date   VD25OH 33.36 07/22/2019   VD25OH 32.69 07/23/2018   She continues on 1000 units vitamin D daily.  Pt denies: - feeling nodules in neck - hoarseness - dysphagia - choking - SOB with lying down  No FH of thyroid ds. No FH of thyroid cancer. No h/o radiation tx to head or neck.  No seaweed or kelp. No recent contrast studies. No herbal supplements. No Biotin use. No recent steroids use.   She also has HTN, HL, GERD, sleep apnea.  HbA1c was 6.8% in 06/2018. In 09/2018: 7.4%.  Before last visit, she started to reduce carbs >> lost 15 lbs! HbA1c 12/2018: 6.4%. HbA1c 06/2019: 6.0%.  However, since last visit, she gained 4 pounds back.  ROS: Constitutional: + weight gain/no weight loss, no fatigue, no subjective hyperthermia, no subjective hypothermia Eyes: no blurry vision, no xerophthalmia ENT: no  sore throat, + see HPI Cardiovascular: no CP/no SOB/no palpitations/no leg swelling Respiratory: no cough/no SOB/no wheezing Gastrointestinal: no N/no V/no D/no C/no acid reflux Musculoskeletal: + Occasional muscle cramps/ no joint aches Skin: no rashes, no hair loss Neurological: no tremors/no numbness/no tingling/no dizziness  I reviewed pt's medications, allergies, PMH, social hx, family hx, and changes were documented in the history of present illness. Otherwise, unchanged from my initial visit note.  Past Medical History:  Diagnosis Date  . Allergic conjunctivitis   . ALLERGIC RHINITIS   . Breast cyst    left breast - ultrasound benign 2010  . Cancer ([place])    Thyroid  . COVID-19   . Esophageal reflux   . Food allergy    angioedema > strawberries  . GERD (gastroesophageal reflux disease)   . Hypertension   . Insomnia   . OSA on CPAP    NPSG 08-03-09: AHI 17.6; CPAP 12/ AHI 0; PLMA- mild does not wear cpap   . PONV (postoperative nausea and vomiting)    Past Surgical History:  Procedure Laterality Date  . BREAST SURGERY  2013   Rt breast mass excision  . CATARACT EXTRACTION, BILATERAL    . CHOLECYSTECTOMY  1991  . DILATION AND CURETTAGE OF UTERUS    . ROTATOR CUFF REPAIR  2006  . squamous cell excised    . THYROIDECTOMY N/A 07/21/2015  Procedure: TOTAL THYROIDECTOMY;  Surgeon: Armandina Gemma, MD;  Location: [facility];  Service: General;  Laterality: N/A;  . TONSILLECTOMY  1952 - approximate  . TOTAL ABDOMINAL HYSTERECTOMY  1978   Social History   Social History  . Marital Status: Divorced    Spouse Name: N/A  . Number of Children: 2   Occupational History  . 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 Smoking status: Never Smoker   . Smokeless tobacco: Never Used  . Alcohol Use: 0.0 oz/week    0 Standard drinks or equivalent per week     Comment: RARELY  . Drug Use: No    Current Outpatient Medications on File Prior to Visit  Medication Sig Dispense Refill  . calcium carbonate (OS-CAL) 600 MG tablet Take 600 mg by mouth daily.    . Cholecalciferol (VITAMIN D3) 3000 units TABS Take 1 tablet by mouth daily.    Marland Kitchen ibuprofen (ADVIL,MOTRIN) 200 MG tablet Take 200 mg by mouth as needed for moderate pain. Reported on 05/11/2015    . labetalol (NORMODYNE) 200 MG tablet Take 1 tablet (200 mg total) by mouth 2 (two) times daily. 60 tablet 0  . OneTouch Delica Lancets 77L MISC USE AS DIRECTED TO CHECK BLOOD GLUCOSE DAILY E11.9    . ONETOUCH VERIO test strip USE AS DIRECTED TO CHECK BLOOD GLUCOSE DAILY E11.9    . SYNTHROID 75 MCG tablet TAKE 1 TABLET BY MOUTH EVERY OTHER DAY 45 tablet 1  . SYNTHROID 88 MCG tablet TAKE 1 TABLET BY MOUTH EVERY DAY IN THE MORNING 90 tablet 2  . triamterene-hydrochlorothiazide (MAXZIDE-25) 37.5-25 MG per tablet Take 1 tablet by mouth daily. 30 tablet 0   No current facility-administered medications on file prior to visit.   Allergies  Allergen Reactions  . Amlodipine Swelling  . Diovan [Valsartan] Swelling  . Lipitor [Atorvastatin Calcium] Other (See Comments)    Caused muscle paralysis in legs  . Codeine Nausea And Vomiting  . Compazine [Prochlorperazine Edisylate] Other (See Comments)    Facial  muscle spasms   . Crestor [Rosuvastatin] Other (See Comments)    Severe joint pain.  . Reglan [Metoclopramide] Other (See Comments)    Facial muscle spasms  . Zoloft [Sertraline Hcl] Other (See Comments)    tremors  . Benadryl [Diphenhydramine Hcl] Palpitations   Family History  Problem Relation Age of Onset  . Other Father        brain tumor  . COPD Mother    PE: BP 128/76   Pulse 68   Ht $R'5\' 3"'et$  (1.6 m)   Wt 167 lb (75.8 kg)   SpO2 98%   BMI 29.58 kg/m  Body mass index is 29.58 kg/m. Wt Readings from Last 3 Encounters:  01/27/20 167 lb (75.8 kg)  12/14/19 170 lb 6.4 oz (77.3 kg)  09/22/19 163 lb (73.9 kg)    Constitutional: Slightly overweight, in NAD Eyes: PERRLA, EOMI, no exophthalmos ENT: moist mucous membranes, no neck masses palpable, no cervical lymphadenopathy Cardiovascular: RRR, No MRG Respiratory: CTA B Gastrointestinal: abdomen soft, NT, ND, BS+ Musculoskeletal: no deformities, strength intact in all 4 Skin: moist, warm, no rashes Neurological: no tremor with outstretched hands, DTR normal in all 4   ASSESSMENT: 1. Papillary thyroid cancer  2. Postsurgical hypothyroidism  3.  Postsurgical hypocalcemia  PLAN: 1. PTC -Patient with small papillary thyroid cancer, 1.1 cm, unilateral.  There was no lymphovascular invasion, however, there was extrathyroidal extension.  Because of this, but considered intermediate risk rather than low risk.  She had RAI treatment with Thyrogen.  On the posttreatment whole-body scan, there were no metastases detected.  Her latest neck ultrasound was from 10/2016, showing no recurrence.  We will repeat her neck ultrasound only if the thyroglobulin starts trending up or if she develops any neck compression symptoms. -We are following her by thyroglobulin and antithyroglobulin antibodies.  Both of these were checked 6 months ago and they were undetectable. -I do not feel any masses at palpation of her neck -She prefers to come back in 6 months, rather than 1 year.  2. Postsurgical hypothyroidism - latest thyroid labs reviewed with pt >> slightly low: Lab Results  Component Value Date   TSH 0.73 09/11/2019   - she continues on LT4 88 alternating with 75 mcg every other day - pt feels good on this dose. - we discussed about taking the thyroid hormone every day, with water, >30 minutes before breakfast, separated by >4 hours from acid reflux medications, calcium, iron, multivitamins. Pt. is taking it correctly. - will check thyroid tests today: TSH and fT4 - If labs are abnormal, she will need to return for repeat TFTs in 1.5 months  3.  Postsurgical  hypocalcemia -Asymptomatic -Her calcium level from 04/08/2017 was slightly low: 8.3 (normal higher than 8.5).  At last visit, her ionized calcium was slightly low, at 4.27 (4.8-5.6).  Vitamin D level was normal-on 1000 units vitamin D daily.  She was asymptomatic.   -At that time, I advised her to add a Tums tablet with dinner - she continues with this.  She also stopped her H2 blocker at night.  I am expecting her calcium level to be absorbed better now. -She remains asymptomatic.  She does have occasional muscle cramps, unclear if related to calcium levels.  However, I did advise her that she can take an extra calcium tablet whenever she experiences these and see if they improve. -We will recheck her ionized calcium and vitamin D today.  Component     Latest Ref Rng & Units 01/27/2020  TSH     0.35 - 4.50 uIU/mL 1.15  T4,Free(Direct)     0.60 - 1.60 ng/dL 1.13  Calcium Ionized     4.8 - 5.6 mg/dL 4.69 (L)  Vitamin D, 25-Hydroxy     30.0 - 100.0 ng/mL 26.1 (L)  Vitamin D is slightly low.  Would increase the dose to 2000 units daily and recheck at next visit.  Calcium is only slightly low.  We will continue with the plan above.  Philemon Kingdom, MD PhD [facility]

## 2020-01-27 NOTE — Patient Instructions (Addendum)
Please stop at the lab.  Please continue Synthroid 88 alternating with 75 mcg every other day.  Continue calcium 500 mg with dinner. You may take an extra calcium tablet as needed.  Continue vitamin D 1000 units daily.  Take the thyroid hormone every day, with water, at least 30 minutes before breakfast, separated by at least 4 hours from: - acid reflux medications - calcium - iron - multivitamins  Please come back for a follow-up appointment in 6 months.

## 2020-01-28 LAB — VITAMIN D 25 HYDROXY (VIT D DEFICIENCY, FRACTURES): Vit D, 25-Hydroxy: 26.1 ng/mL — ABNORMAL LOW (ref 30.0–100.0)

## 2020-01-28 LAB — CALCIUM, IONIZED: Calcium, Ion: 4.69 mg/dL — ABNORMAL LOW (ref 4.8–5.6)

## 2020-03-20 NOTE — Progress Notes (Signed)
HPI female never smoker followed for allergic rhinitis/conjunctivitis, insomnia, OSA/failed CPAP and Provent nasal valves, complicated by history of migraine, hypertension, GERD, thyroid cancer/ sgy/ RAI PFT 07/23/19- WNL- FEV1/FVC 0.77, TLC 95%, DLCO 99%. --------------------------------------------------------------------------------------------------   12/14/19- 74 year old female never smoker followed for allergic rhinitis/conjunctivitis, Insomnia, OSA/failed CPAP and Provent nasal valves, complicated by history of migraine, hypertension, GERD, Thyroid Cancer/ sgy/ RAI, Covid infection 01/2019, Chronic insomnia, unsuccessfull with multiple treatments. Mild OSA and intolerant of CPAP, unwilling to try others. Still sleeping better than she was before Covid. Less stress now, finally retired from [employer].  Discussed general health. It has helped her more to tlak about her sleep issues, than to intervene.  Being followed for carotid stenosis.  I recommend she still get Covid vaccine after having infection last year.   03/21/20- 74 year old female never smoker followed for allergic Rhinitis/conjunctivitis, Insomnia, OSA/failed CPAP and Provent nasal valves, complicated by history of migraine, hypertension, GERD, Thyroid Cancer/ sgy/ RAI, Covid infection 01/2019, Carotid Artery Disease, R Trigeminal Neuralgia,  Chronic insomnia, unsuccessfull with multiple treatments. Mild OSA and intolerant of CPAP, unwilling to try others. Didn't follow through with CBT in past. Covid vax-2 Phizer Flu vax- today senior ---- Long term issue is insomnia, which improved after Covid infection last year. Stress is recognized contributor. I had understood this looked better after she finished term on [employer]. She has accepted invitation to run for [employer] Emphasis remains on relaxation and good sleep hygiene.  Review of Systems-see HPI + = positive Constitutional:     +weight  loss-diet, night sweats, fevers, chills, fatigue, lassitude. + insomnia HEENT:   +headaches, difficulty swallowing, tooth/dental problems, +sore throat,       No- sneezing, itching, no-ears itch,+nasal congestion, post nasal drip,  CV:  No-   chest pain, orthopnea, PND, swelling in lower extremities, anasarca, dizziness, palpitations Resp: +shortness of breath with exertion or at rest.           productive cough, non-productive cough,  No-  coughing up of blood.              No-   change in color of mucus.  No- wheezing.   Skin: No-   rash or lesions. GI:  No-   heartburn, indigestion, abdominal pain, nausea, vomiting,  GU: MS:  No-   joint pain or swelling.   Neuro- :  Psych:  No- change in mood or affect. Some- depression or anxiety.  No memory loss.   Physical Exam  General- Alert, Oriented, Affect-appropriate, very pleasant, has kept weight down. Skin- rash-none, lesions- none, excoriation- none Lymphadenopathy- none Head- atraumatic            Eyes- Gross vision intact, PERRLA, conjunctivae clear secretions            Ears- Canals-, TMs ok,             Nose- clear , No- sores,  no -Septal dev, mucus, polyps, erosion, perforation.             Throat- Mallampati III , mucosa clear-not red , drainage- none, tonsils- atrophic   Neck- flexible , trachea midline, no stridor , thyroid + incision scar barely visible, carotid+ bilateral bruit Chest - symmetrical excursion , unlabored           Heart/CV- RRR , no murmur , no gallop  , no rub, nl s1 s2                           -  JVD- none , edema- none, stasis changes- none, varices- none           Lung- clear, wheeze- none, cough- none, dullness-none, rub- none           Chest wall-  Abd- Br/ Gen/ Rectal- Not done, not indicated Extrem- cyanosis- none, clubbing, none, atrophy- none, strength- nl Neuro- grossly intact to observation

## 2020-03-21 ENCOUNTER — Ambulatory Visit: Payer: Medicare HMO | Admitting: Internal Medicine

## 2020-03-21 ENCOUNTER — Encounter: Payer: Self-pay | Admitting: Internal Medicine

## 2020-03-21 ENCOUNTER — Other Ambulatory Visit: Payer: Self-pay

## 2020-03-21 DIAGNOSIS — F5101 Primary insomnia: Secondary | ICD-10-CM

## 2020-03-21 DIAGNOSIS — Z23 Encounter for immunization: Secondary | ICD-10-CM

## 2020-03-21 DIAGNOSIS — I6523 Occlusion and stenosis of bilateral carotid arteries: Secondary | ICD-10-CM | POA: Diagnosis not present

## 2020-03-21 NOTE — Assessment & Plan Note (Signed)
Continues follow-up

## 2020-03-21 NOTE — Patient Instructions (Addendum)
We can continue current plans  Order- flu shot senior  Please call if we can help

## 2020-03-21 NOTE — Assessment & Plan Note (Signed)
She has continued to sleep better since Covid infection 2020 but otherwise no residual effect. Keep CBT referral in mind as a potential option to bring up again if needed.

## 2020-04-28 ENCOUNTER — Other Ambulatory Visit: Payer: Self-pay

## 2020-04-28 ENCOUNTER — Encounter: Payer: Self-pay | Admitting: Obstetrics and Gynecology

## 2020-04-28 ENCOUNTER — Ambulatory Visit: Payer: Medicare HMO | Admitting: Obstetrics and Gynecology

## 2020-04-28 VITALS — BP 126/82 | Ht 62.5 in | Wt 174.0 lb

## 2020-04-28 DIAGNOSIS — Z01419 Encounter for gynecological examination (general) (routine) without abnormal findings: Secondary | ICD-10-CM

## 2020-04-28 DIAGNOSIS — Z1272 Encounter for screening for malignant neoplasm of vagina: Secondary | ICD-10-CM

## 2020-04-28 NOTE — Progress Notes (Signed)
   Teresa Molina [birth year] [id number]  SUBJECTIVE:  74 y.o. [id number] female here for a breast and pelvic exam and Pap smear. She has no gynecologic concerns.  Current Outpatient Medications  Medication Sig Dispense Refill  . calcium carbonate (OS-CAL) 600 MG tablet Take 600 mg by mouth daily.    . Cholecalciferol (VITAMIN D3) 3000 units TABS Take 1 tablet by mouth daily.    Marland Kitchen ibuprofen (ADVIL,MOTRIN) 200 MG tablet Take 200 mg by mouth as needed for moderate pain. Reported on 05/11/2015    . labetalol (NORMODYNE) 200 MG tablet Take 1 tablet (200 mg total) by mouth 2 (two) times daily. 60 tablet 0  . OneTouch Delica Lancets 37C MISC USE AS DIRECTED TO CHECK BLOOD GLUCOSE DAILY E11.9    . ONETOUCH VERIO test strip USE AS DIRECTED TO CHECK BLOOD GLUCOSE DAILY E11.9    . SYNTHROID 75 MCG tablet TAKE 1 TABLET BY MOUTH EVERY OTHER DAY 45 tablet 1  . SYNTHROID 88 MCG tablet TAKE 1 TABLET BY MOUTH EVERY DAY IN THE MORNING 90 tablet 2  . triamterene-hydrochlorothiazide (MAXZIDE-25) 37.5-25 MG per tablet Take 1 tablet by mouth daily. 30 tablet 0   No current facility-administered medications for this visit.   Allergies: Amlodipine, Diovan [valsartan], Lipitor [atorvastatin calcium], Codeine, Compazine [prochlorperazine edisylate], Crestor [rosuvastatin], Reglan [metoclopramide], Zoloft [sertraline hcl], and Benadryl [diphenhydramine hcl]  No LMP recorded. Patient has had a hysterectomy.  Past medical history,surgical history, problem list, medications, allergies, family history and social history were all reviewed and documented as reviewed in the EPIC chart.  GYN ROS: no abnormal bleeding, pelvic pain or discharge, no breast pain or new or enlarging lumps on self exam.  No dysuria, frequency, burning, pain with urination, cloudy/malodorous urine.   OBJECTIVE:  BP 126/82   Ht 5' 2.5" (1.588 m)   Wt 174 lb (78.9 kg)   BMI 31.32 kg/m  The patient appears well, alert, oriented, in no  distress.  BREAST EXAM: breasts appear normal, no suspicious masses, no skin or nipple changes or axillary nodes  PELVIC EXAM: VULVA: normal appearing vulva with no masses, tenderness or lesions, VAGINA: normal appearing vagina with normal color and discharge, no lesions, CERVIX: surgically absent, UTERUS: surgically absent, vaginal cuff normal, ADNEXA: no masses, nontender, PAP: Pap smear done today, thin-prep method  Chaperone: Caryn Bee present during the examination  ASSESSMENT:  74 y.o. [id number] here for a breast and pelvic exam  PLAN:   1. Postmenopausal. Prior hysterectomy.  No menopausal symptoms. 2. Pap smear 2018.  No history of abnormal Pap smears.  Pap smear from vaginal cuff is collected today.  We discussed the current screening guidelines and she did not feel comfortable with discontinuing screening at this point.   3. Mammogram 05/2019.  Normal breast exam today.  Continue with annual mammograms. 4. Colonoscopy 2020.  She will follow up at the interval recommended by her GI specialist.   5. DEXA 2019 normal.  Next DEXA recommended at 5-year interval.   6. Health maintenance.  No labs today as she normally has these completed elsewhere.  Return annually or sooner, prn.  Joseph Pierini MD 04/28/20

## 2020-04-28 NOTE — Addendum Note (Signed)
Addended by: Nelva Nay on: 04/28/2020 10:30 AM   Modules accepted: Orders

## 2020-04-29 LAB — PAP IG W/ RFLX HPV ASCU

## 2020-05-30 ENCOUNTER — Other Ambulatory Visit: Payer: Self-pay | Admitting: Internal Medicine

## 2020-06-26 NOTE — Progress Notes (Signed)
HPI female never smoker followed for allergic rhinitis/conjunctivitis, insomnia, OSA/failed CPAP and Provent nasal valves, complicated by history of migraine, hypertension, GERD, thyroid cancer/ sgy/ RAI PFT 07/23/19- WNL- FEV1/FVC 0.77, TLC 95%, DLCO 99%. --------------------------------------------------------------------------------------------------   03/21/20- 75 year old female never smoker followed for allergic Rhinitis/conjunctivitis, Insomnia, OSA/failed CPAP and Provent nasal valves, complicated by history of migraine, hypertension, GERD, Thyroid Cancer/ sgy/ RAI, Covid infection 01/2019, Carotid Artery Disease, R Trigeminal Neuralgia,  Chronic insomnia, unsuccessfull with multiple treatments. Mild OSA and intolerant of CPAP, unwilling to try others. Didn't follow through with CBT in past. Covid vax-2 Phizer Flu vax- today senior ---- Long term issue is insomnia, which improved after Covid infection last year. Stress is recognized contributor. I had understood this looked better after she finished term on [employer]. She has accepted invitation to run for [employer] Emphasis remains on relaxation and good sleep hygiene.  06/27/20- 75 year old female never smoker followed for allergic Rhinitis/conjunctivitis, Insomnia, OSA/failed CPAP and Provent nasal valves, complicated by history of migraine, hypertension, GERD, Thyroid Cancer/ sgy/ RAI, Covid infection 01/2019, Carotid Artery Disease, R Trigeminal Neuralgia, DM2, Chronic insomnia, unsuccessfull with multiple treatments. Mild OSA and intolerant of CPAP, unwilling to try others. Didn't follow through with CBT in past Body weight today-173 lbs Covid vax-2 Phizer Flu vax- had Patient doing good overall, states she is sleeping good, does not use CPAP Sleep patterns not really changed. New stress- running for election in [facility]. Chronic insomnia affected by life-stressors.  Review of Systems-see HPI + =  positive Constitutional:     +weight loss-diet, night sweats, fevers, chills, fatigue, lassitude. + insomnia HEENT:   +headaches, difficulty swallowing, tooth/dental problems, +sore throat,       No- sneezing, itching, no-ears itch,+nasal congestion, post nasal drip,  CV:  No-   chest pain, orthopnea, PND, swelling in lower extremities, anasarca, dizziness, palpitations Resp: +shortness of breath with exertion or at rest.           productive cough, non-productive cough,  No-  coughing up of blood.              No-   change in color of mucus.  No- wheezing.   Skin: No-   rash or lesions. GI:  No-   heartburn, indigestion, abdominal pain, nausea, vomiting,  GU: MS:  No-   joint pain or swelling.   Neuro- :  Psych:  No- change in mood or affect. Some- depression or anxiety.  No memory loss.   Physical Exam  General- Alert, Oriented, Affect-appropriate, very pleasant, has kept weight down. Skin- rash-none, lesions- none, excoriation- none Lymphadenopathy- none Head- atraumatic            Eyes- Gross vision intact, PERRLA, conjunctivae clear secretions            Ears- Canals-, TMs ok,             Nose- clear , No- sores,  no -Septal dev, mucus, polyps, erosion, perforation.             Throat- Mallampati III , mucosa clear-not red , drainage- none, tonsils- atrophic   Neck- flexible , trachea midline, no stridor , thyroid + incision scar barely visible, carotid+ bilateral bruit Chest - symmetrical excursion , unlabored           Heart/CV- RRR , no murmur , no gallop  , no rub, nl s1 s2                           -  JVD- none , edema- none, stasis changes- none, varices- none           Lung- clear, wheeze- none, cough- none, dullness-none, rub- none           Chest wall-  Abd- Br/ Gen/ Rectal- Not done, not indicated Extrem- cyanosis- none, clubbing, none, atrophy- none, strength- nl Neuro- grossly intact to observation

## 2020-06-27 ENCOUNTER — Ambulatory Visit (INDEPENDENT_AMBULATORY_CARE_PROVIDER_SITE_OTHER): Payer: Medicare HMO | Admitting: Internal Medicine

## 2020-06-27 ENCOUNTER — Other Ambulatory Visit: Payer: Self-pay

## 2020-06-27 ENCOUNTER — Encounter: Payer: Self-pay | Admitting: Internal Medicine

## 2020-06-27 VITALS — BP 126/70 | HR 67 | Temp 97.5°F | Ht 63.0 in | Wt 173.4 lb

## 2020-06-27 DIAGNOSIS — J441 Chronic obstructive pulmonary disease with (acute) exacerbation: Secondary | ICD-10-CM | POA: Diagnosis not present

## 2020-06-27 DIAGNOSIS — G4733 Obstructive sleep apnea (adult) (pediatric): Secondary | ICD-10-CM

## 2020-06-27 DIAGNOSIS — F5101 Primary insomnia: Secondary | ICD-10-CM

## 2020-06-27 NOTE — Patient Instructions (Signed)
We can continue current meds and plans  Please call if we can help

## 2020-07-27 ENCOUNTER — Ambulatory Visit: Payer: Medicare HMO | Admitting: Internal Medicine

## 2020-07-27 ENCOUNTER — Other Ambulatory Visit: Payer: Self-pay

## 2020-07-27 ENCOUNTER — Encounter: Payer: Self-pay | Admitting: Internal Medicine

## 2020-07-27 VITALS — BP 120/78 | HR 59 | Ht 63.0 in | Wt 176.6 lb

## 2020-07-27 DIAGNOSIS — R790 Abnormal level of blood mineral: Secondary | ICD-10-CM | POA: Diagnosis not present

## 2020-07-27 DIAGNOSIS — C73 Malignant neoplasm of thyroid gland: Secondary | ICD-10-CM

## 2020-07-27 DIAGNOSIS — E890 Postprocedural hypothyroidism: Secondary | ICD-10-CM

## 2020-07-27 DIAGNOSIS — E559 Vitamin D deficiency, unspecified: Secondary | ICD-10-CM | POA: Diagnosis not present

## 2020-07-27 LAB — TSH: TSH: 0.67 u[IU]/mL (ref 0.35–4.50)

## 2020-07-27 LAB — MAGNESIUM: Magnesium: 1.6 mg/dL (ref 1.5–2.5)

## 2020-07-27 LAB — T4, FREE: Free T4: 1.11 ng/dL (ref 0.60–1.60)

## 2020-07-27 NOTE — Progress Notes (Signed)
Patient ID: Teresa Molina, female   DOB: Oct 09, 1945, 75 y.o.   MRN: [id number]  This visit occurred during the SARS-CoV-2 public health emergency.  Safety protocols were in place, including screening questions prior to the visit, additional usage of staff PPE, and extensive cleaning of exam room while observing appropriate contact time as indicated for disinfecting solutions.   HPI  Teresa Molina is a 75 y.o.-year-old female, initially referred by Dr. Rush Farmer for f/u for papillary thyroid cancer, postsurgical hypothyroidism, hypocalcemia, vitamin D insufficiency. Last visit 6 months ago. PCP: Dr. Shelia Media.  Interim history: Since last OV, she had less organized meals as she is very busy-decided to run for [employer]  She does have experiencing this.  She had a higher HbA1c by PCP recently and was started on 500 mg of Metformin.  She has some upset stomach from this. Otherwise, she she gained 9 pounds since last visit, probably related to the less organized diet.  Reviewed her thyroid cancer cancer history: Patient has been found to have an incidental thyroid nodule during a recent carotid ultrasound, on 03/10/2015. The nodule was seen in the left lobe and measured 0.9 x 0.8 x 0.9 centimeters.  Thyroid U/S (05/23/2015): Left lower pole solid hypoechoic nodule measures 10 x 8 x 8 mm, nonspecific. There appears to be minor associated microcalcification.    L nodule was small, but hypoechoic and with possible small calcifications >> I suggested FNA.  Adequacy Reason Satisfactory For Evaluation. Diagnosis THYROID, LEFT LOBE INFERIOR, FINE NEEDLE ASPIRATION (SPECIMEN 1 OF 1, COLLECTED ON 06/07/2015): POSITIVE FOR PAPILLARY THYROID CARCINOMA ([place] CATEGORY VI). [name] MD Pathologist, Electronic Signature (Case signed 06/08/2015) Specimen Clinical Information Left lower pole solid hypoechoic nodule measures 10 x 8 x 8 mm, nonspecific, There appears to be minor  associated microcalcification Source Thyroid, Fine Needle Aspiration, Left Lobe Inferior, (Specimen 1 of 1, collected on 06/07/15 )  She had total thyroidectomy by Dr. Harlow Asa on 07/21/2015. Final pathology showed: Diagnosis Thyroid, thyroidectomy, total - PAPILLARY CARCINOMA, CLASSIC VARIANT, SPANNING 1.1 CM. - EXTRATHYROIDAL EXTENSION PRESENT. - RESECTION MARGINS ARE NEGATIVE. - NO LYMPHOVASCULAR INVASION. - PARATHYROID TISSUE PRESENT. - SEE ONCOLOGY TABLE. Microscopic Comment THYROID Specimen: Total thyroid. Procedure (including lymph node sampling if applicable): Total thyroidectomy. Specimen Integrity (intact/fragmented): Intact. Tumor focality: Unifocal. Dominant tumor: Maximum tumor size (cm): 1.1 cm. Tumor laterality: Left. Histologic type (including subtype and/or unique features as applicable): Papillary carcinoma, classic variant. Tumor capsule: Partial. Extrathyroidal extension: Present. Capsular invasion with degree of invasion if present: N/A. Margins: Negative. Lymphatic or vascular invasion: Not identified. Lymph nodes: # examined 0; # positive; 0 TNM code: pT3, pNX Non-neoplastic thyroid: Nodular hyperplasia, benign parathyroid tissue.  10/19/2015: RAI tx 123 mCi 10/28/2015: post-tx WBS: No metastasis 10/25/2016: Neck U/S:  no residual thyroid tissue or adenopathy  Thyroglobulin levels and ATA antibodies are undetectable: Lab Results  Component Value Date   THYROGLB <0.1 (L) 07/22/2019   THYROGLB <0.1 (L) 07/23/2018   THYROGLB <0.1 (L) 10/25/2017   THYROGLB 0.1 (L) 06/07/2017   THYROGLB <0.1 (L) 10/25/2016   THYROGLB 0.3 (L) 11/07/2015   THGAB <1 07/22/2019   THGAB <1 07/23/2018   THGAB <1 10/25/2017   THGAB <1 06/07/2017   THGAB <1 10/25/2016   THGAB <1 11/07/2015   Postsurgical hypothyroidism: Pt is on Synthroid d.a.w. 88 alternating with 75 mcg every other day: - in am - fasting - at least 30 min from b'fast - no Fe, MVI, PPIs - + calcium  with dinner - not on Biotin  Reviewed her TFTs: Lab Results  Component Value Date   TSH 1.15 01/27/2020   TSH 0.73 09/11/2019   TSH 0.34 (L) 07/22/2019   TSH 0.54 01/23/2019   TSH 0.37 07/23/2018   TSH 0.96 10/25/2017   TSH 3.34 06/07/2017   TSH 2.29 10/25/2016   TSH 2.28 07/25/2016   TSH 1.64 06/01/2016   FREET4 1.13 01/27/2020   FREET4 1.11 09/11/2019   FREET4 1.35 07/22/2019   FREET4 1.21 01/23/2019   FREET4 1.19 07/23/2018   FREET4 1.05 10/25/2017   FREET4 1.06 06/07/2017   FREET4 0.87 10/25/2016   FREET4 1.02 07/25/2016   FREET4 0.92 06/01/2016  04/05/2017: TSH 7.87 01/02/2016: TSH 0.22. 03/07/2015: TSH 3.22, fT4 0.99   She has postsurgical hypocalcemia:  She had a Calcium of 7.4 postop >> started calcium carbonate but she came off afterwards.  We restarted this 07/2018: 500 mg with dinner.  At last visit she was not taking this consistently, only 1-2 times a week.  I did advise her to start taking this daily.  Reviewed calcium levels: 01/27/2020: Ionized calcium 4.69 07/22/2019: Ionized calcium 4.27 01/21/2019: Ionized calcium 4.75  07/23/2018: Ionized calcium 4.66 (4.8-5.6) 04/08/2017: Calcium 8.3 Lab Results  Component Value Date   CALCIUM 8.8 11/07/2015   CALCIUM 7.4 (L) 07/22/2015   CALCIUM 9.4 07/13/2015   Of note, magnesium level was also low recently: 05/05/2020: Magnesium 1.7 (1.8-2.4)  She has vitamin D insufficiency: 05/05/2020 vitamin D 28 Lab Results  Component Value Date   VD25OH 26.1 (L) 01/27/2020   VD25OH 33.36 07/22/2019   VD25OH 32.69 07/23/2018   At last visit she was on 1000 units vitamin D daily.  We increased the dose to 2000 units daily.  She thinks she takes this dose, but will check at home.  Most recent kidney function: 05/05/2020: BUN/creatinine 21/0.8, GFR 74.3.  Pt denies: - feeling nodules in neck - hoarseness - dysphagia - choking - SOB with lying down  No FH of thyroid ds. No FH of thyroid cancer. No h/o radiation  tx to head or neck.  No seaweed or kelp. No recent contrast studies. No herbal supplements. No Biotin use. No recent steroids use.   She also has HTN, HL, GERD, sleep apnea.  She also has a history of diabetes.  Latest HbA1c 06/2019 available for review: 6.0%. 2 mo ago: 7.2%. Started Metformin. No results found for: HGBA1C  ROS: Constitutional: + weight gain/no weight loss, no fatigue, no subjective hyperthermia, no subjective hypothermia Eyes: no blurry vision, no xerophthalmia ENT: no sore throat, + see HPI Cardiovascular: no CP/no SOB/no palpitations/no leg swelling Respiratory: no cough/no SOB/no wheezing Gastrointestinal: no N/no V/no D/no C/no acid reflux, + occasional abdominal pain with Metformin Musculoskeletal: + Occasional muscle cramps/ no joint aches Skin: no rashes, no hair loss Neurological: no tremors/no numbness/no tingling/no dizziness  I reviewed pt's medications, allergies, PMH, social hx, family hx, and changes were documented in the history of present illness. Otherwise, unchanged from my initial visit note.  Past Medical History:  Diagnosis Date   Allergic conjunctivitis    ALLERGIC RHINITIS    Breast cyst    left breast - ultrasound benign 2010   Cancer ([place])    Thyroid   COVID-19    Esophageal reflux    Food allergy    angioedema > strawberries   GERD (gastroesophageal reflux disease)    Hypertension    Insomnia    OSA on CPAP  NPSG 08-03-09: AHI 17.6; CPAP 12/ AHI 0; PLMA- mild does not wear cpap    PONV (postoperative nausea and vomiting)    Past Surgical History:  Procedure Laterality Date   BREAST SURGERY  2013   Rt breast mass excision   CATARACT EXTRACTION, BILATERAL     CHOLECYSTECTOMY  1991   DILATION AND CURETTAGE OF UTERUS     ROTATOR CUFF REPAIR  2006   squamous cell excised     THYROIDECTOMY N/A 07/21/2015   Procedure: TOTAL THYROIDECTOMY;  Surgeon: Armandina Gemma, MD;  Location: [facility];  Service: General;   Laterality: N/A;   TONSILLECTOMY  1952 - approximate   TOTAL ABDOMINAL HYSTERECTOMY  1978   Social History   Social History   Marital Status: Divorced    Spouse Name: N/A   Number of Children: 2   Occupational History   Retired from [employer]     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     Cholecalciferol (VITAMIN D3) 3000 units TABS Take 1 tablet by mouth daily.     ibuprofen (ADVIL,MOTRIN) 200 MG tablet Take 200 mg by mouth as needed for moderate pain. Reported on 05/11/2015     labetalol (NORMODYNE) 200 MG tablet Take 1 tablet (200 mg total) by mouth 2 (two) times daily. 60 tablet 0   metFORMIN (GLUCOPHAGE-XR) 500 MG 24 hr tablet Take 500 mg by mouth daily.     OneTouch Delica Lancets 63A MISC USE AS DIRECTED TO CHECK BLOOD GLUCOSE DAILY E11.9     ONETOUCH VERIO test strip USE AS DIRECTED TO CHECK BLOOD GLUCOSE DAILY E11.9     SYNTHROID 75 MCG tablet TAKE 1 TABLET BY MOUTH EVERY OTHER DAY 45 tablet 1   SYNTHROID 88 MCG tablet TAKE 1 TABLET BY MOUTH EVERY DAY IN THE MORNING 90 tablet 2   triamterene-hydrochlorothiazide (MAXZIDE-25) 37.5-25 MG per tablet Take 1 tablet by mouth daily. 30 tablet 0   No current facility-administered medications on file prior to visit.   Allergies  Allergen Reactions   Amlodipine Swelling   Diovan [Valsartan] Swelling   Lipitor [Atorvastatin Calcium] Other (See Comments)    Caused muscle paralysis in legs   Codeine Nausea And Vomiting   Compazine [Prochlorperazine Edisylate] Other (See Comments)    Facial  muscle spasms    Crestor  [Rosuvastatin] Other (See Comments)    Severe joint pain.   Reglan [Metoclopramide] Other (See Comments)    Facial muscle spasms   Zoloft [Sertraline Hcl] Other (See Comments)    tremors   Benadryl [Diphenhydramine Hcl] Palpitations   Family History  Problem Relation Age of Onset   Other Father        brain tumor   COPD Mother    PE: BP 120/78 (BP Location: Right Arm, Patient Position: Sitting, Cuff Size: Normal)    Pulse (!) 59    Ht $R'5\' 3"'Kp$  (1.6 m)    Wt 176 lb 9.6 oz (80.1 kg)    SpO2 97%    BMI 31.28 kg/m  Body mass index is 31.28 kg/m. Wt Readings from Last 3 Encounters:  07/27/20 176 lb 9.6 oz (80.1 kg)  06/27/20 173 lb 6.4 oz (78.7 kg)  04/28/20 174 lb (78.9 kg)   Constitutional: Slightly overweight, in NAD Eyes: PERRLA, EOMI, no exophthalmos ENT: moist mucous membranes, no neck masses palpable, no cervical lymphadenopathy Cardiovascular: RRR, No MRG Respiratory: CTA B Gastrointestinal: abdomen soft, NT, ND, BS+ Musculoskeletal: no deformities, strength intact in all 4 Skin: moist, warm, no rashes Neurological: no tremor with outstretched hands, DTR normal in all 4   ASSESSMENT: 1. Papillary thyroid cancer  2. Postsurgical hypothyroidism  3.  Postsurgical hypocalcemia  4.  Vitamin D insufficiency  5.  Low magnesium  6. DM2, non-insulin-dependent, without long-term complications  PLAN: 1. PTC -Patient with small papillary thyroid cancer, 1.1 cm, unilateral.  There was no lymphovascular invasion, however, there was extrathyroidal extension.  Because of this, she may be considered at intermediate risk, rather than low risk for recurrence.  She had RAI treatment with Thyrogen.  The post treatment whole-body scan did not show any abnormal uptake.  Her latest thyroid ultrasound report was reviewed from 10/2016: No recurrence.  We plan to repeat another ultrasound only if thyroglobulin starts trending upward or if she develops neck compression symptoms -We are  following her by thyroglobulin and antithyroglobulin antibodies.  Both of these were checked a year ago and they were normal.  We will repeat them today. -No masses palpated in neck -She prefers to come back in 6 months, rather than 1 year  2. Postsurgical hypothyroidism - latest thyroid labs reviewed with pt >> normal: Lab Results  Component Value Date   TSH 1.15 01/27/2020   - she continues on LT4 88 alternating with 75 mcg every other day - pt feels good on this dose. - we discussed about taking the thyroid hormone every day, with water, >30 minutes before breakfast, separated by >4 hours from acid reflux medications, calcium, iron, multivitamins. Pt. is taking it correctly. - will check thyroid tests today: TSH and fT4 - If labs are abnormal, she will need to return for repeat TFTs in 1.5 months  3.  Postsurgical hypocalcemia -She remains asymptomatic.  He does have occasional muscle cramps, unclear if related to the calcium level.  I did advise her that she cannot an extra calcium tablet to see if these resolve. -Her calcium level from 04/08/2017 was slightly low: 8.3 (normal higher than 8.5).  At last visit, her ionized calcium was slightly low, at 4.27 (4.8-5.6).  Vitamin D level was normal-on 1000 units vitamin D daily.  -We did add a Tums tablet with dinner and she continues on this.   -At last visit, we checked an ionized calcium and this was slightly low.  Vitamin D was also slightly low so we increased her supplement. -I reviewed her most recent labs from PCP from 05/05/2020: Calcium was normal, at 8.6 (8.5-10.1).  GFR was only slightly low at 74.3.  Magnesium was low at 1.7 (1.8-2.4) and vitamin D was slightly low at 28. -At today's visit we will repeat her magnesium and vitamin D.  4.  Vitamin D insufficiency -Latest 2 vitamin D levels were low at 26 and 28, respectively, despite increasing her vitamin D supplement from 1000 to 2000 units daily at last visit. -No joint  pains -We will repeat her vitamin D level today.  5.  Low magnesium -Most recent magnesium was slightly low -We will recheck this today -discussed about the  possible need to start magnesium supplementation, probably a low dose of 200 mg daily  6. DM2 -Latest HbA1c was higher reportedly, at 7.2% -Her diabetes is managed by PCP.   -Since she described intolerance to Metformin I advised her to move it with dinner (she now takes it at bedtime) but if GI intolerance persists, to try to switch to extended release Metformin  Component     Latest Ref Rng & Units 07/27/2020  Thyroglobulin     ng/mL <0.1 (L)  Comment        TSH     0.35 - 4.50 uIU/mL 0.67  T4,Free(Direct)     0.60 - 1.60 ng/dL 1.11  Thyroglobulin Ab     < or = 1 IU/mL <1  Calcium Ionized     4.8 - 5.6 mg/dL 4.71 (L)  Vitamin D, 25-Hydroxy     30.0 - 100.0 ng/mL 26.2 (L)  Magnesium     1.5 - 2.5 mg/dL 1.6   Magnesium level is normal.  For now, she can continue without magnesium supplementation. Ionized calcium is slightly low, but vitamin D is also slightly low.  I will advise her to increase vit D to 4000 units daily.  We will recheck the vitamin D and calcium at next visit. TFTs are normal. Thyroglobulin is undetectable, which is excellent.  Philemon Kingdom, MD PhD [facility] Endocrinology

## 2020-07-27 NOTE — Patient Instructions (Signed)
Please stop at the lab.  Please continue Synthroid 88 alternating with 75 mcg every other day.  Take the thyroid hormone every day, with water, at least 30 minutes before breakfast, separated by at least 4 hours from: - acid reflux medications - calcium - iron - multivitamins  Continue calcium 500 mg with dinner. You may take an extra calcium tablet as needed.  Continue vitamin D 2000 units daily.  Please come back for a follow-up appointment in 6 months.

## 2020-07-28 LAB — VITAMIN D 25 HYDROXY (VIT D DEFICIENCY, FRACTURES): Vit D, 25-Hydroxy: 26.2 ng/mL — ABNORMAL LOW (ref 30.0–100.0)

## 2020-07-28 LAB — THYROGLOBULIN LEVEL: Thyroglobulin: <0.1 ng/mL — ABNORMAL LOW

## 2020-07-28 LAB — THYROGLOBULIN ANTIBODY: Thyroglobulin Ab: <1 IU/mL (ref <=1)

## 2020-07-28 LAB — CALCIUM, IONIZED: Calcium, Ion: 4.71 mg/dL — ABNORMAL LOW (ref 4.8–5.6)

## 2020-08-04 ENCOUNTER — Other Ambulatory Visit: Payer: Self-pay | Admitting: Internal Medicine

## 2020-08-26 ENCOUNTER — Other Ambulatory Visit: Payer: Self-pay | Admitting: Internal Medicine

## 2020-08-26 DIAGNOSIS — E7800 Pure hypercholesterolemia, unspecified: Secondary | ICD-10-CM

## 2020-09-17 ENCOUNTER — Encounter: Payer: Self-pay | Admitting: Internal Medicine

## 2020-09-17 NOTE — Assessment & Plan Note (Signed)
Managed with weight loss and conservative measures. Sleeps alone- no reporter

## 2020-09-17 NOTE — Assessment & Plan Note (Signed)
Chronic issue she has learned to live with.  Emphasis on stress control

## 2020-09-29 ENCOUNTER — Ambulatory Visit
Admission: RE | Admit: 2020-09-29 | Discharge: 2020-09-29 | Disposition: A | Discharge status: Home / Self Care | Payer: Medicare HMO | Source: Ambulatory Visit | Attending: Internal Medicine | Admitting: Internal Medicine

## 2020-09-29 DIAGNOSIS — E7800 Pure hypercholesterolemia, unspecified: Secondary | ICD-10-CM

## 2020-10-29 NOTE — Progress Notes (Signed)
HPI female never smoker followed for allergic rhinitis/conjunctivitis, insomnia, OSA/failed CPAP and Provent nasal valves, complicated by history of migraine, hypertension, GERD, thyroid cancer/ sgy/ RAI PFT 07/23/19- WNL- FEV1/FVC 0.77, TLC 95%, DLCO 99%. -------------------------------------------------------------------------------------------------  06/27/20- 75 year old female never smoker followed for allergic Rhinitis/conjunctivitis, Insomnia, OSA/failed CPAP and Provent nasal valves, complicated by history of migraine, hypertension, GERD, Thyroid Cancer/ sgy/ RAI, Covid infection 01/2019, Carotid Artery Disease, R Trigeminal Neuralgia, DM2, Chronic insomnia, unsuccessfull with multiple treatments. Mild OSA and intolerant of CPAP, unwilling to try others. Didn't follow through with CBT in past Body weight today-173 lbs Covid vax-2 Phizer Flu vax- had Patient doing good overall, states she is sleeping good, does not use CPAP Sleep patterns not really changed. New stress- running for election in [facility]. Chronic insomnia affected by life-stressors.  10/31/20- 75 year old female never smoker followed for allergic Rhinitis/conjunctivitis, Insomnia, minimal OSA/treated with weight loss-failed CPAP and Provent nasal valves, complicated by history of migraine, hypertension, GERD, Thyroid Cancer/ sgy/ RAI, Covid infection 01/2019, Carotid Artery Disease, R Trigeminal Neuralgia, DM2, Chronic insomnia, unsuccessfull with multiple treatments. Mild OSA and intolerant of CPAP, unwilling to try others. Didn't follow through with CBT in past Body weight today- 171 lbs Covid vax-2 Phizer Unsuccessful run for [employer]. Less stress now. Sleep is much less concern in last 2 years after catching Covid. Falls asleep easily around 1:30 AM. Wakes around 6:30. No sleep meds now.  OSA managed with approx 20 lb weight loss. Now controls diet due to DM2. Seasonal allergic rhinitis minimal now.   Calcium CT heart score - zero w small deposits in aorta.  Review of Systems-see HPI + = positive Constitutional:     +weight loss-diet, night sweats, fevers, chills, fatigue, lassitude.  + insomnia HEENT:   +headaches, difficulty swallowing, tooth/dental problems, +sore throat,       No- sneezing, itching, no-ears itch,+nasal congestion, post nasal drip,  CV:  No-   chest pain, orthopnea, PND, swelling in lower extremities, anasarca, dizziness, palpitations Resp: +shortness of breath with exertion or at rest.           productive cough, non-productive cough,  No-  coughing up of blood.              No-   change in color of mucus.  No- wheezing.   Skin: No-   rash or lesions. GI:  No-   heartburn, indigestion, abdominal pain, nausea, vomiting,  GU: MS:  No-   joint pain or swelling.   Neuro- :  Psych:  No- change in mood or affect. Some- depression or anxiety.  No memory loss.   Physical Exam  General- Alert, Oriented, Affect-appropriate, very pleasant, has kept weight down. Skin- rash-none, lesions- none, excoriation- none Lymphadenopathy- none Head- atraumatic            Eyes- Gross vision intact, PERRLA, conjunctivae clear secretions            Ears- Canals-, TMs ok,             Nose- clear , No- sores,  no -Septal dev, mucus, polyps, erosion, perforation.             Throat- Mallampati III , mucosa clear-not red , drainage- none, tonsils- atrophic   Neck- flexible , trachea midline, no stridor , thyroid + incision scar barely visible, carotid- +I don't hear bruit today Chest - symmetrical excursion , unlabored           Heart/CV- RRR , no  murmur , no gallop  , no rub, nl s1 s2                           - JVD- none , edema- none, stasis changes- none, varices- none           Lung- clear, wheeze- none, cough- none, dullness-none, rub- none           Chest wall-  Abd- Br/ Gen/ Rectal- Not done, not indicated Extrem- cyanosis- none, clubbing, none, atrophy- none, strength-  nl Neuro- grossly intact to observation

## 2020-10-31 ENCOUNTER — Other Ambulatory Visit: Payer: Self-pay

## 2020-10-31 ENCOUNTER — Encounter: Payer: Self-pay | Admitting: Internal Medicine

## 2020-10-31 ENCOUNTER — Ambulatory Visit: Payer: Medicare HMO | Admitting: Internal Medicine

## 2020-10-31 DIAGNOSIS — E890 Postprocedural hypothyroidism: Secondary | ICD-10-CM | POA: Diagnosis not present

## 2020-10-31 DIAGNOSIS — J302 Other seasonal allergic rhinitis: Secondary | ICD-10-CM | POA: Diagnosis not present

## 2020-10-31 DIAGNOSIS — G4733 Obstructive sleep apnea (adult) (pediatric): Secondary | ICD-10-CM

## 2020-10-31 DIAGNOSIS — J3089 Other allergic rhinitis: Secondary | ICD-10-CM | POA: Diagnosis not present

## 2020-10-31 NOTE — Assessment & Plan Note (Signed)
Minimal sleep apnea, much improved by weight loss. No other intervention needed now.

## 2020-10-31 NOTE — Assessment & Plan Note (Signed)
Managed by Endocrinology. Deenies daytime fatigue.

## 2020-10-31 NOTE — Assessment & Plan Note (Signed)
Occasional sx's, managed otc now.

## 2020-10-31 NOTE — Patient Instructions (Signed)
Continue good sleep habits  Let mee know if allergies are a problem.  Please call if we can help

## 2020-11-17 ENCOUNTER — Other Ambulatory Visit: Payer: Self-pay | Admitting: Internal Medicine

## 2020-12-05 ENCOUNTER — Telehealth: Payer: Self-pay | Admitting: Internal Medicine

## 2020-12-05 DIAGNOSIS — C73 Malignant neoplasm of thyroid gland: Secondary | ICD-10-CM

## 2020-12-05 MED ORDER — SYNTHROID 88 MCG PO TABS: 88.0000 ug | ORAL_TABLET | ORAL | 0 refills | Status: DC

## 2020-12-05 MED ORDER — SYNTHROID 75 MCG PO TABS: 75.0000 ug | ORAL_TABLET | ORAL | 0 refills | Status: DC

## 2020-12-05 NOTE — Telephone Encounter (Signed)
MEDICATION: synthroid 75 mcg and synthroid 88 mcg  PHARMACY:   [place], [place] Phone:  [phone]  Fax:  [phone]      HAS THE PATIENT CONTACTED THEIR PHARMACY?  yes  IS THIS A 90 DAY SUPPLY : pharmacy didn't know  IS PATIENT OUT OF MEDICATION: yes  IF NOT; HOW MUCH IS LEFT:   LAST APPOINTMENT DATE: '@7'$ /11/2020  NEXT APPOINTMENT DATE:'@9'$ /16/2022  DO WE HAVE YOUR PERMISSION TO LEAVE A DETAILED MESSAGE?: yes  OTHER COMMENTS:    **Let patient know to contact pharmacy at the end of the day to make sure medication is ready. **  ** Please notify patient to allow 48-72 hours to process**  **Encourage patient to contact the pharmacy for refills or they can request refills through [facility]**

## 2020-12-05 NOTE — Telephone Encounter (Signed)
Rx sent 

## 2020-12-12 ENCOUNTER — Other Ambulatory Visit: Payer: Self-pay | Admitting: Internal Medicine

## 2020-12-12 DIAGNOSIS — C73 Malignant neoplasm of thyroid gland: Secondary | ICD-10-CM

## 2020-12-26 ENCOUNTER — Other Ambulatory Visit: Payer: Self-pay | Admitting: Internal Medicine

## 2020-12-26 DIAGNOSIS — C73 Malignant neoplasm of thyroid gland: Secondary | ICD-10-CM

## 2021-01-03 NOTE — Progress Notes (Signed)
HPI Female never smoker followed for allergic Rhinitis/conjunctivitis, Insomnia, minimal OSA/treated with weight loss-failed CPAP and Provent nasal valves, complicated by history of migraine, hypertension, GERD, Thyroid Cancer/ sgy/ RAI/ Hypothyroid, , Covid infection 01/2019, Carotid Artery Disease, R Trigeminal Neuralgia, DM2, Chronic insomnia, unsuccessfull with multiple treatments. Mild OSA and intolerant of CPAP, unwilling to try others. Didn't follow through with CBT in past PFT 07/23/19- WNL- FEV1/FVC 0.77, TLC 95%, DLCO 99%. -------------------------------------------------------------------------------------------------   10/31/20- 75 year old female never smoker followed for allergic Rhinitis/conjunctivitis, Insomnia, minimal OSA/treated with weight loss-failed CPAP and Provent nasal valves, complicated by history of migraine, hypertension, GERD, Thyroid Cancer/ sgy/ RAI, Covid infection 01/2019, Carotid Artery Disease, R Trigeminal Neuralgia, DM2, Chronic insomnia, unsuccessfull with multiple treatments. Mild OSA and intolerant of CPAP, unwilling to try others. Didn't follow through with CBT in past Body weight today- 171 lbs Covid vax-2 Phizer Unsuccessful run for [employer]. Less stress now. Sleep is much less concern in last 2 years after catching Covid. Falls asleep easily around 1:30 AM. Wakes around 6:30. No sleep meds now.  OSA managed with approx 20 lb weight loss. Now controls diet due to DM2. Seasonal allergic rhinitis minimal now.  Calcium CT heart score - zero w small deposits in aorta.  01/03/21- 75 year old female never smoker followed for allergic Rhinitis/conjunctivitis, Insomnia, minimal OSA/treated with weight loss(-failed CPAP and Provent nasal valves), complicated by history of migraine, hypertension, GERD, Thyroid Cancer/ sgy/ RAI/ Hypothyroid, , Covid infection 01/2019, Carotid Artery Disease, R Trigeminal Neuralgia, DM2, Chronic insomnia, unsuccessfull with  multiple treatments.  Didn't follow through with CBT in past Body weight today- 165.5 lbs Covid vax-2 Phizer She continues to sleep better since COVID infection 2 years ago.  Unexplained.  I suspect she still has more insomnia than she admits to.  We have reviewed sleep habits and treatment options. After thyroid cancer, she continues follow-up with endocrinology. Trying to keep A1c down.  Review of Systems-see HPI + = positive Constitutional:     +weight loss-diet, night sweats, fevers, chills, fatigue, lassitude.  + insomnia HEENT:   +headaches, difficulty swallowing, tooth/dental problems, +sore throat,       No- sneezing, itching, no-ears itch,+nasal congestion, post nasal drip,  CV:  No-   chest pain, orthopnea, PND, swelling in lower extremities, anasarca, dizziness, palpitations Resp: +shortness of breath with exertion or at rest.           productive cough, non-productive cough,  No-  coughing up of blood.              No-   change in color of mucus.  No- wheezing.   Skin: No-   rash or lesions. GI:  No-   heartburn, indigestion, abdominal pain, nausea, vomiting,  GU: MS:  No-   joint pain or swelling.   Neuro- :  Psych:  No- change in mood or affect. Some- depression or anxiety.  No memory loss.   Physical Exam  General- Alert, Oriented, Affect-appropriate, very pleasant, has kept weight down. Skin- rash-none, lesions- none, excoriation- none Lymphadenopathy- none Head- atraumatic            Eyes- Gross vision intact, PERRLA, conjunctivae clear secretions            Ears- Canals-, TMs ok,             Nose- clear , No- sores,  no -Septal dev, mucus, polyps, erosion, perforation.             Throat- Mallampati III ,  mucosa clear-not red , drainage- none, tonsils- atrophic   Neck- flexible , trachea midline, no stridor , thyroid + incision scar barely visible, carotid- +I don't hear bruit today Chest - symmetrical excursion , unlabored           Heart/CV- RRR , no murmur , no  gallop  , no rub, nl s1 s2                           - JVD- none , edema- none, stasis changes- none, varices- none           Lung- clear, wheeze- none, cough- none, dullness-none, rub- none           Chest wall-  Abd- Br/ Gen/ Rectal- Not done, not indicated Extrem- cyanosis- none, clubbing, none, atrophy- none, strength- nl Neuro- grossly intact to observation

## 2021-01-04 ENCOUNTER — Ambulatory Visit: Payer: Medicare HMO | Admitting: Internal Medicine

## 2021-01-04 ENCOUNTER — Encounter: Payer: Self-pay | Admitting: Internal Medicine

## 2021-01-04 ENCOUNTER — Other Ambulatory Visit: Payer: Self-pay

## 2021-01-04 DIAGNOSIS — I6523 Occlusion and stenosis of bilateral carotid arteries: Secondary | ICD-10-CM

## 2021-01-04 DIAGNOSIS — F5101 Primary insomnia: Secondary | ICD-10-CM

## 2021-01-04 NOTE — Patient Instructions (Signed)
Continue present treatment - please call as needed

## 2021-01-24 ENCOUNTER — Other Ambulatory Visit: Payer: Self-pay | Admitting: Internal Medicine

## 2021-01-24 DIAGNOSIS — C73 Malignant neoplasm of thyroid gland: Secondary | ICD-10-CM

## 2021-01-27 ENCOUNTER — Other Ambulatory Visit (INDEPENDENT_AMBULATORY_CARE_PROVIDER_SITE_OTHER): Payer: Medicare HMO

## 2021-01-27 ENCOUNTER — Ambulatory Visit: Payer: Medicare HMO | Admitting: Internal Medicine

## 2021-01-27 ENCOUNTER — Encounter: Payer: Self-pay | Admitting: Internal Medicine

## 2021-01-27 ENCOUNTER — Other Ambulatory Visit: Payer: Self-pay

## 2021-01-27 VITALS — BP 120/78 | HR 79 | Ht 63.0 in | Wt 166.0 lb

## 2021-01-27 DIAGNOSIS — C73 Malignant neoplasm of thyroid gland: Secondary | ICD-10-CM | POA: Diagnosis not present

## 2021-01-27 DIAGNOSIS — E890 Postprocedural hypothyroidism: Secondary | ICD-10-CM | POA: Diagnosis not present

## 2021-01-27 DIAGNOSIS — R790 Abnormal level of blood mineral: Secondary | ICD-10-CM

## 2021-01-27 DIAGNOSIS — E559 Vitamin D deficiency, unspecified: Secondary | ICD-10-CM

## 2021-01-27 LAB — VITAMIN D 25 HYDROXY (VIT D DEFICIENCY, FRACTURES): VITD: 22.13 ng/mL — ABNORMAL LOW (ref 30.00–100.00)

## 2021-01-27 LAB — TSH: TSH: 2.07 u[IU]/mL (ref 0.35–5.50)

## 2021-01-27 LAB — T4, FREE: Free T4: 0.97 ng/dL (ref 0.60–1.60)

## 2021-01-27 NOTE — Patient Instructions (Addendum)
Please stop at the lab.  Please continue Synthroid 88 alternating with 75 mcg every other day.  Take the thyroid hormone every day, with water, at least 30 minutes before breakfast, separated by at least 4 hours from: - acid reflux medications - calcium - iron - multivitamins  Continue calcium 500 mg with dinner. You may take an extra calcium tablet as needed.  Continue vitamin D 4000 units daily.  Please come back for a follow-up appointment in 6 months.

## 2021-01-27 NOTE — Progress Notes (Signed)
Patient ID: [name], female   DOB: February 06, 1946, 75 y.o.   MRN: [id number]  This visit occurred during the SARS-CoV-2 public health emergency.  Safety protocols were in place, including screening questions prior to the visit, additional usage of staff PPE, and extensive cleaning of exam room while observing appropriate contact time as indicated for disinfecting solutions.   HPI  Teresa Molina is a 75 y.o.-year-old female, initially referred by Dr. Rush Farmer for f/u for papillary thyroid cancer, postsurgical hypothyroidism, hypocalcemia, vitamin D insufficiency. Last visit 6 months ago. PCP: Dr. Shelia Media.  Interim history: Before last visit,  she gained 9 pounds as she had less organized meals as she was very busy. She now lost 10 lbs  - after starting Ozempic (by PCP).she initially had nausea with Ozempic, now improved. No increased urination, blurry vision, chest pain.   Reviewed her thyroid cancer cancer history: Patient has been found to have an incidental thyroid nodule during a recent carotid ultrasound, on 03/10/2015. The nodule was seen in the left lobe and measured 0.9 x 0.8 x 0.9 centimeters.  Thyroid U/S (05/23/2015): Left lower pole solid hypoechoic nodule measures 10 x 8 x 8 mm, nonspecific. There appears to be minor associated microcalcification.    L nodule was small, but hypoechoic and with possible small calcifications >> I suggested FNA.  Adequacy Reason Satisfactory For Evaluation. Diagnosis THYROID, LEFT LOBE INFERIOR, FINE NEEDLE ASPIRATION (SPECIMEN 1 OF 1, COLLECTED ON 06/07/2015): POSITIVE FOR PAPILLARY THYROID CARCINOMA ([place] CATEGORY VI). [name] MD Pathologist, Electronic Signature (Case signed 06/08/2015) Specimen Clinical Information Left lower pole solid hypoechoic nodule measures 10 x 8 x 8 mm, nonspecific, There appears to be minor associated microcalcification Source Thyroid, Fine Needle Aspiration, Left Lobe Inferior, (Specimen 1 of  1, collected on 06/07/15 )  She had total thyroidectomy by Dr. Harlow Asa on 07/21/2015. Final pathology showed: Diagnosis Thyroid, thyroidectomy, total - PAPILLARY CARCINOMA, CLASSIC VARIANT, SPANNING 1.1 CM. - EXTRATHYROIDAL EXTENSION PRESENT. - RESECTION MARGINS ARE NEGATIVE. - NO LYMPHOVASCULAR INVASION. - PARATHYROID TISSUE PRESENT. - SEE ONCOLOGY TABLE. Microscopic Comment THYROID Specimen: Total thyroid. Procedure (including lymph node sampling if applicable): Total thyroidectomy. Specimen Integrity (intact/fragmented): Intact. Tumor focality: Unifocal. Dominant tumor: Maximum tumor size (cm): 1.1 cm. Tumor laterality: Left. Histologic type (including subtype and/or unique features as applicable): Papillary carcinoma, classic variant. Tumor capsule: Partial. Extrathyroidal extension: Present. Capsular invasion with degree of invasion if present: N/A. Margins: Negative. Lymphatic or vascular invasion: Not identified. Lymph nodes: # examined 0; # positive; 0 TNM code: pT3, pNX Non-neoplastic thyroid: Nodular hyperplasia, benign parathyroid tissue.  10/19/2015: RAI tx 123 mCi 10/28/2015: post-tx WBS: No metastasis 10/25/2016: Neck U/S:  no residual thyroid tissue or adenopathy  Thyroglobulin levels and ATA antibodies are undetectable: Lab Results  Component Value Date   THYROGLB <0.1 (L) 07/27/2020   THYROGLB <0.1 (L) 07/22/2019   THYROGLB <0.1 (L) 07/23/2018   THYROGLB <0.1 (L) 10/25/2017   THYROGLB 0.1 (L) 06/07/2017   THYROGLB <0.1 (L) 10/25/2016   THYROGLB 0.3 (L) 11/07/2015   THGAB <1 07/27/2020   THGAB <1 07/22/2019   THGAB <1 07/23/2018   THGAB <1 10/25/2017   THGAB <1 06/07/2017   THGAB <1 10/25/2016   THGAB <1 11/07/2015   Postsurgical hypothyroidism: Pt is on Synthroid d.a.w. 88 alternating with 75 mcg every other day: - in am - fasting - at least 30 min from b'fast - no Fe, MVI, PPIs - + calcium with dinner - not on Biotin  Reviewed her  TFTs: Lab Results  Component Value Date   TSH 0.67 07/27/2020   TSH 1.15 01/27/2020   TSH 0.73 09/11/2019   TSH 0.34 (L) 07/22/2019   TSH 0.54 01/23/2019   TSH 0.37 07/23/2018   TSH 0.96 10/25/2017   TSH 3.34 06/07/2017   TSH 2.29 10/25/2016   TSH 2.28 07/25/2016   FREET4 1.11 07/27/2020   FREET4 1.13 01/27/2020   FREET4 1.11 09/11/2019   FREET4 1.35 07/22/2019   FREET4 1.21 01/23/2019   FREET4 1.19 07/23/2018   FREET4 1.05 10/25/2017   FREET4 1.06 06/07/2017   FREET4 0.87 10/25/2016   FREET4 1.02 07/25/2016  04/05/2017: TSH 7.87 01/02/2016: TSH 0.22. 03/07/2015: TSH 3.22, fT4 0.99   She has postsurgical hypocalcemia:  She had a Calcium of 7.4 postop >> started calcium carbonate but she came off afterwards.  We restarted this 07/2018: 500 mg with dinner.  At last visit she was not taking this consistently, only 1-2 times a week.  I did advise her to start taking this daily.  Reviewed calcium levels: 11/03/2020: Ionized calcium 4.84 (4.6-5.3), calcium 8.9 (8.6-10.4), PTH 25.5 01/27/2020: Ionized calcium 4.69 07/22/2019: Ionized calcium 4.27 01/21/2019: Ionized calcium 4.75  07/23/2018: Ionized calcium 4.66 (4.8-5.6) 04/08/2017: Calcium 8.3 Lab Results  Component Value Date   CALCIUM 8.8 11/07/2015   CALCIUM 7.4 (L) 07/22/2015   CALCIUM 9.4 07/13/2015   Magnesium level was previously low, then normal: Lab Results  Component Value Date   MG 1.6 07/27/2020  05/05/2020: Magnesium 1.7 (1.8-2.4)  She has vitamin D insufficiency: Lab Results  Component Value Date   VD25OH 26.2 (L) 07/27/2020   VD25OH 26.1 (L) 01/27/2020   VD25OH 33.36 07/22/2019   VD25OH 32.69 07/23/2018  05/05/2020 vitamin D 28  At last visit she was on 2000 units vitamin D daily.  We increased the dose to 4000 units daily.  She misses doses.  Most recent kidney function: 05/05/2020: BUN/creatinine 21/0.8, GFR 74.3.  Pt denies: - feeling nodules in neck - hoarseness - dysphagia - choking - SOB  with lying down  No FH of thyroid ds. No FH of thyroid cancer. No h/o radiation tx to head or neck.  No herbal supplements. No Biotin use. No recent steroids use.   She also has HTN, HL, GERD, sleep apnea.  She also has diabetes, managed by PCP.  Latest HbA1c available for review: 7.2%.  Started Metformin >> could not tolerate it. She had yeast inf's with [place]. Now on Ozempic. Had nausea with it >> now improved after backing off the dose. No results found for: HGBA1C  ROS: + see HPI - rash on chest  I reviewed pt's medications, allergies, PMH, social hx, family hx, and changes were documented in the history of present illness. Otherwise, unchanged from my initial visit note.  Past Medical History:  Diagnosis Date   Allergic conjunctivitis    ALLERGIC RHINITIS    Breast cyst    left breast - ultrasound benign 2010   Cancer [facility])    Thyroid   COVID-19    Esophageal reflux    Food allergy    angioedema > strawberries   GERD (gastroesophageal reflux disease)    Hypertension    Insomnia    OSA on CPAP    NPSG 08-03-09: AHI 17.6; CPAP 12/ AHI 0; PLMA- mild does not wear cpap    PONV (postoperative nausea and vomiting)    Past Surgical History:  Procedure Laterality Date   BREAST SURGERY  2013   Rt  breast mass excision   CATARACT EXTRACTION, BILATERAL     CHOLECYSTECTOMY  1991   DILATION AND CURETTAGE OF UTERUS     ROTATOR CUFF REPAIR  2006   squamous cell excised     THYROIDECTOMY N/A 07/21/2015   Procedure: TOTAL THYROIDECTOMY;  Surgeon: Armandina Gemma, MD;  Location: [facility];  Service: General;  Laterality: N/A;   TONSILLECTOMY  1952 - approximate   TOTAL ABDOMINAL HYSTERECTOMY  1978   Social History   Social History   Marital Status: Divorced    Spouse Name: N/A   Number of Children: 2   Occupational History   Retired from [employer]     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     Cholecalciferol (VITAMIN D3) 3000 units TABS Take 1 tablet by mouth daily.     ibuprofen (ADVIL,MOTRIN) 200 MG tablet Take 200 mg by mouth as needed for moderate pain. Reported on 05/11/2015     labetalol (NORMODYNE) 200 MG tablet Take 1 tablet (200 mg total) by mouth 2 (two) times daily. 60 tablet 0   OneTouch Delica Lancets 21J MISC USE AS DIRECTED TO CHECK BLOOD GLUCOSE DAILY E11.9     ONETOUCH VERIO test strip USE AS DIRECTED TO CHECK BLOOD GLUCOSE DAILY E11.9     Semaglutide,0.25 or 0.5MG/DOS, (OZEMPIC, 0.25 OR 0.5 MG/DOSE,) 2 MG/1.5ML SOPN 0.5 mg     SYNTHROID 75 MCG tablet TAKE ONE TABLET BY MOUTH EVERY OTHER DAY 45 tablet 0   SYNTHROID 88 MCG tablet Take 1 tablet (88 mcg total) by mouth every other day. TAKE 1 TABLET BY MOUTH EVERY DAY IN THE MORNING 45 tablet 0   triamterene-hydrochlorothiazide (MAXZIDE-25) 37.5-25 MG per tablet Take 1 tablet by mouth daily. 30 tablet 0   No current facility-administered medications on file prior to visit.   Allergies  Allergen Reactions   Amlodipine Swelling   Diovan [Valsartan] Swelling   Lipitor [Atorvastatin Calcium] Other (See Comments)    Caused muscle paralysis in legs   Codeine Nausea And Vomiting   Compazine [Prochlorperazine Edisylate] Other (See Comments)    Facial  muscle spasms    Metformin Hcl Er     Other reaction(s): diarrhea, heartburn   Reglan [Metoclopramide] Other (See Comments)    Facial muscle spasms   Rosuvastatin Other (See Comments)    Severe joint pain. Other reaction(s): myalgia   Zoloft [Sertraline Hcl] Other (See Comments)    tremors   Benadryl [Diphenhydramine Hcl] Palpitations   Family History   Problem Relation Age of Onset   Other Father        brain tumor   COPD Mother    PE: BP 120/78 (BP Location: Left Arm, Patient Position: Sitting, Cuff Size: Normal)   Pulse 79   Ht _0  (1.6 m)   Wt 166 lb (75.3 kg)   SpO2 96%   BMI 29.41 kg/m  Body mass index is 29.41 kg/m. Wt Readings from Last 3 Encounters:  01/27/21 166 lb (75.3 kg)  01/04/21 165 lb 12.8  oz (75.2 kg)  10/31/20 171 lb (77.6 kg)   Constitutional: Slightly overweight, in NAD Eyes: PERRLA, EOMI, no exophthalmos ENT: moist mucous membranes, no neck masses palpable, no cervical lymphadenopathy Cardiovascular: RRR, No MRG Respiratory: CTA B Gastrointestinal: abdomen soft, NT, ND, BS+ Musculoskeletal: no deformities, strength intact in all 4 Skin: moist, warm, + mild erythema on chest Neurological: no tremor with outstretched hands, DTR normal in all 4  ASSESSMENT: 1. Papillary thyroid cancer  2. Postsurgical hypothyroidism  3.  Postsurgical hypocalcemia  4.  Vitamin D insufficiency  5.  Low magnesium  6. DM2, non-insulin-dependent, without long-term complications  PLAN: 1. PTC -Patient with small papillary thyroid cancer, 1.1 cm, unilateral.  There was no lymphovascular invasion, however, there was extrathyroidal extension.  Because of this, she may be considered at intermediate risk, rather than low risk for recurrence.  She had RAI treatment with Thyrogen.  The post treatment whole-body scan did not show any abnormal uptake.  Her latest thyroid ultrasound report was reviewed from 10/2016: No recurrence.  We plan to repeat another ultrasound only if thyroglobulin starts trending up or if she develops neck compression symptoms -We are following her by thyroglobulin and antithyroglobulin antibodies.  Levels were undetectable at last visit, 6 months ago.  We will not repeat this today. -No masses palpated in neck -She prefers to come back on a 72-month basis, rather than once a year  2. Postsurgical  hypothyroidism - latest thyroid labs reviewed with pt. >> normal: Lab Results  Component Value Date   TSH 0.67 07/27/2020  - she continues on LT4 88 alternating with 75 mcg every other - pt feels good on this dose. - we discussed about taking the thyroid hormone every day, with water, >30 minutes before breakfast, separated by >4 hours from acid reflux medications, calcium, iron, multivitamins. Pt. is taking it correctly. - will check thyroid tests today: TSH and fT4 - If labs are abnormal, she will need to return for repeat TFTs in 1.5 months  3.  Postsurgical hypocalcemia -She remains asymptomatic.  In the past she describes occasional muscle cramps, unclear if related to the calcium level.  I did advise her to take an extra calcium tablet when these happen to see if they resolve. -Latest calcium and PTH levels were normal: 11/03/2020: Ionized calcium 4.84 (4.6-5.3), calcium 8.9 (8.6-10.4), PTH 25.5 -We will not repeat this today  4.  Vitamin D insufficiency -At last visit, vitamin D level was still slightly low, at 26.2 so we increased the dose of her vitamin D supplement from 2000 units daily to 4000 units daily.  She did start this but not taking it consistently as she forgets many doses.  I advised her to get a pillbox. -Denies joint pains -We will repeat her vitamin D level today  5.  Low magnesium -She has a history of a slightly low magnesium: 04/2020: Magnesium 1.7 (1.8-2.4) -We will recheck her magnesium level at last visit and it was normal -We will not repeat this today  6. DM2 -This is fairly well controlled, with the latest HbA1c being 7.2%, after which she was started on metformin -At last visit, per her questioning, I advised that the metformin with dinner as she was taking it at bedtime and she had GI intolerance.  We did discuss that she may need to switch to extended release metformin if the GI intolerance persisted after moving the dose.  At this visit she tells me  that she could not tolerate metformin.  Coral Ceo, but developed yeast infections.  Currently on Ozempic, initially with nausea now tolerated well.   -She lost 10 pounds since last visit and I explained that this can mean a beneficial side effect of Ozempic -Her diabetes is managed by PCP  Component     Latest Ref Rng & Units 01/27/2021  T4,Free(Direct)     0.60 - 1.60 ng/dL 0.97  TSH     0.35 - 5.50 uIU/mL 2.07  VITD     30.00 - 100.00 ng/mL 22.13 (L)  TFTs are normal but vitamin D is low, as expected, since she is noncompliant with her vitamin D supplement.  Philemon Kingdom, MD PhD [facility] Endocrinology

## 2021-01-29 ENCOUNTER — Encounter: Payer: Self-pay | Admitting: Internal Medicine

## 2021-02-25 ENCOUNTER — Other Ambulatory Visit: Payer: Self-pay | Admitting: Internal Medicine

## 2021-02-25 DIAGNOSIS — C73 Malignant neoplasm of thyroid gland: Secondary | ICD-10-CM

## 2021-03-21 NOTE — Progress Notes (Signed)
HPI Female never smoker followed for allergic Rhinitis/conjunctivitis, Insomnia, minimal OSA/treated with weight loss-failed CPAP and Provent nasal valves, complicated by history of migraine, hypertension, GERD, Thyroid Cancer/ sgy/ RAI/ Hypothyroid, , Covid infection 01/2019, Carotid Artery Disease, R Trigeminal Neuralgia, DM2, Chronic insomnia, unsuccessfull with multiple treatments. Mild OSA and intolerant of CPAP, unwilling to try others. Didn't follow through with CBT in past PFT 07/23/19- WNL- FEV1/FVC 0.77, TLC 95%, DLCO 99%. -------------------------------------------------------------------------------------------------   01/03/21- 75 year old female never smoker followed for allergic Rhinitis/conjunctivitis, Insomnia, minimal OSA/treated with weight loss(-failed CPAP and Provent nasal valves), complicated by history of migraine, hypertension, GERD, Thyroid Cancer/ sgy/ RAI/ Hypothyroid, , Covid infection 01/2019, Carotid Artery Disease, R Trigeminal Neuralgia, DM2, Chronic insomnia, unsuccessfull with multiple treatments.  Didn't follow through with CBT in past Body weight today- 165.5 lbs Covid vax-2 Phizer  03/22/21- 75 year old female never smoker followed for allergic Rhinitis/conjunctivitis, Insomnia, minimal OSA/treated with weight loss(-failed CPAP and Provent nasal valves), complicated by history of Migraine, HTN, GERD, Thyroid Cancer/ sgy/ RAI/ Hypothyroid, , Covid infection 01/2019, Carotid Artery Disease, R Trigeminal Neuralgia, DM2, Chronic insomnia, unsuccessfull with multiple treatments.  Didn't follow through with CBT in past Body weight today- 168 lbs Covid vax-2 Phizer Flu vax-today -----Doing okay with sleep currently Chronic problem with insomnia has continued to be better since she had COVID infection in 2020.  Less stress since the election issues are resolved.  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 No recurrence of thyroid cancer-long-term endocrine follow-up.  Review of  Systems-see HPI + = positive Constitutional:     +weight loss-diet, night sweats, fevers, chills, fatigue, lassitude.  + insomnia HEENT:   +headaches, difficulty swallowing, tooth/dental problems, +sore throat,       No- sneezing, itching, no-ears itch,+nasal congestion, post nasal drip,  CV:  No-   chest pain, orthopnea, PND, swelling in lower extremities, anasarca, dizziness, palpitations Resp: +shortness of breath with exertion or at rest.           productive cough, non-productive cough,  No-  coughing up of blood.              No-   change in color of mucus.  No- wheezing.   Skin: No-   rash or lesions. GI:  No-   heartburn, indigestion, abdominal pain, nausea, vomiting,  GU: MS:  No-   joint pain or swelling.   Neuro- :  Psych:  No- change in mood or affect. Some- depression or anxiety.  No memory loss.   Physical Exam  General- Alert, Oriented, Affect-appropriate, very pleasant, has kept weight down. Skin- rash-none, lesions- none, excoriation- none Lymphadenopathy- none Head- atraumatic            Eyes- Gross vision intact, PERRLA, conjunctivae clear secretions            Ears- Canals-, TMs ok,             Nose- clear , No- sores,  no -Septal dev, mucus, polyps, erosion, perforation.             Throat- Mallampati III , mucosa clear-not red , drainage- none, tonsils- atrophic   Neck- flexible , trachea midline, no stridor , thyroid + incision scar barely visible, carotid- +I don't hear bruit today Chest - symmetrical excursion , unlabored           Heart/CV- RRR , no murmur , no gallop  , no rub, nl s1 s2                           -  JVD- none , edema- none, stasis changes- none, varices- none           Lung- clear, wheeze- none, cough- none, dullness-none, rub- none           Chest wall-  Abd- Br/ Gen/ Rectal- Not done, not indicated Extrem- cyanosis- none, clubbing, none, atrophy- none, strength- nl Neuro- grossly intact to observation

## 2021-03-22 ENCOUNTER — Ambulatory Visit: Payer: Medicare HMO | Admitting: Internal Medicine

## 2021-03-22 ENCOUNTER — Encounter: Payer: Self-pay | Admitting: Internal Medicine

## 2021-03-22 ENCOUNTER — Other Ambulatory Visit: Payer: Self-pay

## 2021-03-22 VITALS — BP 124/68 | HR 67 | Temp 97.9°F | Ht 63.0 in | Wt 168.6 lb

## 2021-03-22 DIAGNOSIS — F5101 Primary insomnia: Secondary | ICD-10-CM | POA: Diagnosis not present

## 2021-03-22 DIAGNOSIS — Z23 Encounter for immunization: Secondary | ICD-10-CM | POA: Diagnosis not present

## 2021-03-22 DIAGNOSIS — G4733 Obstructive sleep apnea (adult) (pediatric): Secondary | ICD-10-CM

## 2021-03-22 NOTE — Patient Instructions (Signed)
Order- flu vax- senior  Suggested names- first available in either group  - Dr Elijah Birk group    [facility]   - Dr Dorathy [facility]insman [facility] group   [facility] on [address]

## 2021-04-04 ENCOUNTER — Other Ambulatory Visit: Payer: Self-pay | Admitting: Internal Medicine

## 2021-04-04 DIAGNOSIS — C73 Malignant neoplasm of thyroid gland: Secondary | ICD-10-CM

## 2021-04-10 ENCOUNTER — Encounter (HOSPITAL_COMMUNITY): Payer: Self-pay | Admitting: Emergency Medicine

## 2021-04-10 ENCOUNTER — Emergency Department (HOSPITAL_COMMUNITY): Payer: Medicare HMO

## 2021-04-10 ENCOUNTER — Emergency Department (HOSPITAL_COMMUNITY)
Admission: EM | Admit: 2021-04-10 | Discharge: 2021-04-10 | Disposition: A | Discharge status: Home / Self Care | Payer: Medicare HMO | Attending: Emergency Medicine | Admitting: Emergency Medicine

## 2021-04-10 DIAGNOSIS — Z85850 Personal history of malignant neoplasm of thyroid: Secondary | ICD-10-CM | POA: Diagnosis not present

## 2021-04-10 DIAGNOSIS — Z79899 Other long term (current) drug therapy: Secondary | ICD-10-CM | POA: Diagnosis not present

## 2021-04-10 DIAGNOSIS — I10 Essential (primary) hypertension: Secondary | ICD-10-CM | POA: Diagnosis not present

## 2021-04-10 DIAGNOSIS — Z8616 Personal history of COVID-19: Secondary | ICD-10-CM | POA: Insufficient documentation

## 2021-04-10 DIAGNOSIS — R10819 Abdominal tenderness, unspecified site: Secondary | ICD-10-CM

## 2021-04-10 DIAGNOSIS — M545 Low back pain, unspecified: Secondary | ICD-10-CM | POA: Insufficient documentation

## 2021-04-10 DIAGNOSIS — K219 Gastro-esophageal reflux disease without esophagitis: Secondary | ICD-10-CM | POA: Insufficient documentation

## 2021-04-10 DIAGNOSIS — R109 Unspecified abdominal pain: Secondary | ICD-10-CM | POA: Diagnosis not present

## 2021-04-10 DIAGNOSIS — E039 Hypothyroidism, unspecified: Secondary | ICD-10-CM | POA: Insufficient documentation

## 2021-04-10 LAB — CBC WITH DIFFERENTIAL/PLATELET
Abs Immature Granulocytes: 0.03 10*3/uL (ref 0.00–0.07)
Basophils Absolute: 0 10*3/uL (ref 0.0–0.1)
Basophils Relative: 0 %
Eosinophils Absolute: 0.1 10*3/uL (ref 0.0–0.5)
Eosinophils Relative: 1 %
HCT: 44.2 % (ref 36.0–46.0)
Hemoglobin: 15.5 g/dL — ABNORMAL HIGH (ref 12.0–15.0)
Immature Granulocytes: 0 %
Lymphocytes Relative: 10 %
Lymphs Abs: 0.7 10*3/uL (ref 0.7–4.0)
MCH: 32.7 pg (ref 26.0–34.0)
MCHC: 35.1 g/dL (ref 30.0–36.0)
MCV: 93.2 fL (ref 80.0–100.0)
Monocytes Absolute: 0.4 10*3/uL (ref 0.1–1.0)
Monocytes Relative: 5 %
Neutro Abs: 6.3 10*3/uL (ref 1.7–7.7)
Neutrophils Relative %: 84 %
Platelets: 131 10*3/uL — ABNORMAL LOW (ref 150–400)
RBC: 4.74 MIL/uL (ref 3.87–5.11)
RDW: 12.7 % (ref 11.5–15.5)
WBC: 7.6 10*3/uL (ref 4.0–10.5)
nRBC: 0 % (ref 0.0–0.2)

## 2021-04-10 LAB — BASIC METABOLIC PANEL
Anion gap: 8 (ref 5–15)
BUN: 19 mg/dL (ref 8–23)
CO2: 24 mmol/L (ref 22–32)
Calcium: 8.5 mg/dL — ABNORMAL LOW (ref 8.9–10.3)
Chloride: 106 mmol/L (ref 98–111)
Creatinine, Ser: 1 mg/dL (ref 0.44–1.00)
GFR, Estimated: 59 mL/min — ABNORMAL LOW (ref >60)
Glucose, Bld: 169 mg/dL — ABNORMAL HIGH (ref 70–99)
Potassium: 3.9 mmol/L (ref 3.5–5.1)
Sodium: 138 mmol/L (ref 135–145)

## 2021-04-10 LAB — URINALYSIS, MICROSCOPIC (REFLEX)

## 2021-04-10 LAB — URINALYSIS, ROUTINE W REFLEX MICROSCOPIC
Bilirubin Urine: NEGATIVE
Glucose, UA: NEGATIVE mg/dL
Ketones, ur: NEGATIVE mg/dL
Leukocytes,Ua: NEGATIVE
Nitrite: NEGATIVE
Protein, ur: NEGATIVE mg/dL
Specific Gravity, Urine: 1.02 (ref 1.005–1.030)
pH: 6.5 (ref 5.0–8.0)

## 2021-04-10 NOTE — ED Triage Notes (Signed)
Pt here with c/o left flank pain some slight n/v does have hx of kidney stone also having some problems urinating

## 2021-04-10 NOTE — Discharge Instructions (Addendum)
Likely you have passed a kidney stone, recommend staying hydrated, may use over-the-counter pain medication as needed.  Please follow your PCP  and  urology for further evaluation.  Come back to the emergency department if you develop chest pain, shortness of breath, severe abdominal pain, uncontrolled nausea, vomiting, diarrhea.

## 2021-04-10 NOTE — ED Notes (Signed)
Dc instructions reviewed with pt no questions or concerns at this time. Will follow up with urology. Pt decline wheelchair and ambulated out of ed without difficulty.

## 2021-04-10 NOTE — ED Provider Notes (Signed)
[facility] EMERGENCY DEPARTMENT Provider Note   CSN: [id number] Arrival date & time: 04/10/21  1245     History No chief complaint on file.   Teresa Molina is a 75 y.o. female.  HPI  Patient with medical history including status post thyroid cancer, GERD, hypertension, kidney stones presents with chief complaint of left sided flank pain.  Patient states pain came on suddenly, described in the left lower aspect of back, does not radiate, pain was sharp in nature and consistent worsened with movements, denies paresthesia weakness lower extremities, denies saddle paresthesias, urinary incontinency or retention.  Denies any trauma to her back no history of chronic back pain.  She had associated difficulty urination, denies dysuria, hematuria, stomach pain, had nausea without vomiting, denies fevers, chills, chest pain, shortness breath, general body aches.  She states pain was significant while at work but her pain is since resolved.  States that when she urinated saw something come out and now her pain is very minimal at this time.  She is no other complaints.  Denies alleviating or aggravating factors.  No sniffing abdominal history.  Past Medical History:  Diagnosis Date   Allergic conjunctivitis    ALLERGIC RHINITIS    Breast cyst    left breast - ultrasound benign 2010   Cancer ([place])    Thyroid   COVID-19    Esophageal reflux    Food allergy    angioedema > strawberries   GERD (gastroesophageal reflux disease)    Hypertension    Insomnia    OSA on CPAP    NPSG 08-03-09: AHI 17.6; CPAP 12/ AHI 0; PLMA- mild does not wear cpap    PONV (postoperative nausea and vomiting)     Patient Active Problem List   Diagnosis Date Noted   Vitamin D insufficiency 07/27/2020   Carotid artery disease ([place]) 09/15/2019   Dyspnea on exertion 05/28/2019   COVID-19 virus infection 01/30/2019   Iatrogenic hypocalcemia 01/23/2019   Trigeminal neuralgia of right side of face 06/27/2016    Postoperative hypothyroidism 08/08/2015   Papillary thyroid carcinoma ([place]) 07/17/2015   RESTLESS LEGS SYNDROME 11/04/2009   Obstructive sleep apnea 09/07/2009   MIGRAINE W/AURA W/O INTRACT W/O STATUS MIGRNOSUS 11/10/2008   Acute bronchitis 06/30/2008   G E R D 05/30/2007   ALLERGIC CONJUNCTIVITIS 04/15/2007   HYPERTENSION 04/15/2007   Seasonal and perennial allergic rhinitis 04/15/2007   Insomnia 04/15/2007    Past Surgical History:  Procedure Laterality Date   BREAST SURGERY  2013   Rt breast mass excision   CATARACT EXTRACTION, BILATERAL     CHOLECYSTECTOMY  1991   DILATION AND CURETTAGE OF UTERUS     ROTATOR CUFF REPAIR  2006   squamous cell excised     THYROIDECTOMY N/A 07/21/2015   Procedure: TOTAL THYROIDECTOMY;  Surgeon: Armandina Gemma, MD;  Location: [facility];  Service: General;  Laterality: N/A;   TONSILLECTOMY  1952 - approximate   TOTAL ABDOMINAL HYSTERECTOMY  1978     OB History     Gravida  2   Para  2   Term  2   Preterm      AB      Living  2      SAB      IAB      Ectopic      Multiple      Live Births              Family History  Problem Relation  Age of Onset   Other Father        brain tumor   COPD Mother     Social History   Tobacco Use   Smoking status: Never   Smokeless tobacco: Never  Vaping Use   Vaping Use: Never used  Substance Use Topics   Alcohol use: Yes    Alcohol/week: 0.0 standard drinks    Comment: occasional    Drug use: No    Home Medications Prior to Admission medications   Medication Sig Start Date End Date Taking? Authorizing Provider  calcium carbonate (OS-CAL) 600 MG tablet Take 600 mg by mouth daily.    [provider]  Cholecalciferol (VITAMIN D3) 3000 units TABS Take 1 tablet by mouth daily.    [provider]  ibuprofen (ADVIL,MOTRIN) 200 MG tablet Take 200 mg by mouth as needed for moderate pain. Reported on 05/11/2015    [provider]  labetalol (NORMODYNE) 200 MG  tablet Take 1 tablet (200 mg total) by mouth 2 (two) times daily. 07/27/13   Croitoru, Mihai, MD  OneTouch Delica Lancets 82U MISC USE AS DIRECTED TO CHECK BLOOD GLUCOSE DAILY E11.9 03/23/19   [provider]  ONETOUCH VERIO test strip USE AS DIRECTED TO CHECK BLOOD GLUCOSE DAILY E11.9 03/24/19   [provider]  Semaglutide,0.25 or 0.5MG /DOS, (OZEMPIC, 0.25 OR 0.5 MG/DOSE,) 2 MG/1.5ML SOPN 0.5 mg    [provider]  SYNTHROID 75 MCG tablet TAKE 1 TABLET BY MOUTH EVERY OTHER DAY 02/27/21   Philemon Kingdom, MD  SYNTHROID 88 MCG tablet TAKE ONE TABLET BY MOUTH EVERY OTHER DAYIN THE MORNING 04/04/21   Philemon Kingdom, MD  triamterene-hydrochlorothiazide (MAXZIDE-25) 37.5-25 MG per tablet Take 1 tablet by mouth daily. 07/27/13   Croitoru, Mihai, MD    Allergies    Amlodipine, Diovan [valsartan], Lipitor [atorvastatin calcium], Codeine, Compazine [prochlorperazine edisylate], Metformin hcl er, Reglan [metoclopramide], Rosuvastatin, Zoloft [sertraline hcl], and Benadryl [diphenhydramine hcl]  Review of Systems   Review of Systems  Constitutional:  Negative for chills and fever.  HENT:  Negative for congestion.   Respiratory:  Negative for shortness of breath.   Cardiovascular:  Negative for chest pain.  Gastrointestinal:  Positive for nausea. Negative for abdominal pain and vomiting.  Genitourinary:  Positive for difficulty urinating and flank pain. Negative for dysuria, enuresis, frequency and hematuria.  Musculoskeletal:  Positive for back pain.  Skin:  Negative for rash.  Neurological:  Negative for dizziness.  Hematological:  Does not bruise/bleed easily.   Physical Exam Updated Vital Signs BP (!) 141/81 (BP Location: Right Arm)   Pulse 81   Temp 98.3 F (36.8 C) (Oral)   Resp 18   SpO2 97%   Physical Exam Vitals and nursing note reviewed.  Constitutional:      General: She is not in acute distress.    Appearance: She is not ill-appearing.  HENT:      Head: Normocephalic and atraumatic.     Nose: No congestion.  Eyes:     Conjunctiva/sclera: Conjunctivae normal.  Cardiovascular:     Rate and Rhythm: Normal rate and regular rhythm.     Pulses: Normal pulses.     Heart sounds: No murmur heard.   No friction rub. No gallop.  Pulmonary:     Effort: No respiratory distress.     Breath sounds: No wheezing, rhonchi or rales.  Abdominal:     Palpations: Abdomen is soft.     Tenderness: There is no abdominal tenderness. There  is left CVA tenderness. There is no right CVA tenderness.     Comments: Abdomen nondistended, normal bowel sounds, dull to percussion.  No abdominal tenderness on my exam, she has slight flank and CVA tenderness.  No other acute abnormalities present.  Musculoskeletal:     Comments: Full range of motion, 5 of 5 strength, neurovascularly intact in lower extremities.  Spine was palpated was nontender to palpation.  There is no overlying skin changes noted.  Skin:    General: Skin is warm and dry.  Neurological:     Mental Status: She is alert.  Psychiatric:        Mood and Affect: Mood normal.    ED Results / Procedures / Treatments   Labs (all labs ordered are listed, but only abnormal results are displayed) Labs Reviewed  URINALYSIS, ROUTINE W REFLEX MICROSCOPIC - Abnormal; Notable for the following components:      Result Value   Hgb urine dipstick LARGE (*)    All other components within normal limits  URINALYSIS, MICROSCOPIC (REFLEX) - Abnormal; Notable for the following components:   Bacteria, UA FEW (*)    All other components within normal limits  BASIC METABOLIC PANEL - Abnormal; Notable for the following components:   Glucose, Bld 169 (*)    Calcium 8.5 (*)    GFR, Estimated 59 (*)    All other components within normal limits  CBC WITH DIFFERENTIAL/PLATELET - Abnormal; Notable for the following components:   Hemoglobin 15.5 (*)    Platelets 131 (*)    All other components within normal limits     EKG None  Radiology CT Renal Stone Study  Result Date: 04/10/2021 CLINICAL DATA:  Flank pain and suspected renal calculus. EXAM: CT ABDOMEN AND PELVIS WITHOUT CONTRAST TECHNIQUE: Multidetector CT imaging of the abdomen and pelvis was performed following the standard protocol without IV contrast. COMPARISON:  None FINDINGS: Lower chest: Incidental imaging of the lung bases with trace RIGHT effusion. No dense consolidative process. Hepatobiliary: Hepatic steatosis. Mildly lobular hepatic contours with fissural widening. No visible lesion in the liver. Post cholecystectomy. No gross biliary duct dilation. Pancreas: Signs of atrophy of the pancreatic head. No signs of pancreatic inflammation or gross ductal dilation. Spleen: Top normal splenic size.  Normal splenic contour. Adrenals/Urinary Tract: Mild cortical scarring of the LEFT kidney. No hydronephrosis. No perinephric stranding. No renal or ureteral calculi. Urinary bladder with smooth contours. No perivesical stranding. Stomach/Bowel: Small hiatal hernia. No signs of bowel obstruction or acute bowel process. Appendix not visualized. No secondary signs to suggest acute appendicitis. Vascular/Lymphatic: Aortic atherosclerosis. No sign of aneurysm. Smooth contour of the IVC. There is no gastrohepatic or hepatoduodenal ligament lymphadenopathy. No retroperitoneal or mesenteric lymphadenopathy. No pelvic sidewall lymphadenopathy. Limited assessment of vascular structures due to lack of intravenous contrast. Reproductive: Post hysterectomy.  No adnexal mass. Other: No ascites.  No free air. Musculoskeletal: Spinal degenerative changes without acute or destructive bone process. IMPRESSION: No acute intra-abdominal or pelvic findings. No evidence of urinary tract calculi or obstruction. Hepatic steatosis. Mildly lobular hepatic contours with fissural widening. Correlate with any clinical or laboratory evidence of liver disease. Small hiatal hernia. Aortic  Atherosclerosis (ICD10-I70.0). Electronically Signed   By: Zetta Bills M.D.   On: 04/10/2021 14:49    Procedures Procedures   Medications Ordered in ED Medications - No data to display  ED Course  I have reviewed the triage vital signs and the nursing notes.  Pertinent labs & imaging results that were  available during my care of the patient were reviewed by me and considered in my medical decision making (see chart for details).    MDM Rules/Calculators/A&P                          Initial impression-presents with left-sided flank pain.  Alert, no acute distress, vital signs reassuring.  Likely kidney stones which has since passed.  Will obtain UA, basic lab work, CT imaging and reassess.  Work-up-UA negative for nitrates or leukocytes, shows red blood cells with few bacteria.  CT renal shows no acute intra-abdominal or pelvic findings, no evidence of urinary calculi or obstruction, hepatc steatosis  Reassessment-patient reassessed has no complaints this time, vital signs remained stable she is agreeable for discharge.  Rule out- I have low suspicion for spinal fracture or spinal cord abnormality as patient denies urinary incontinency, retention, difficulty with bowel movements, denies saddle paresthesias.  Spine was palpated there is no step-off, crepitus or gross deformities felt, patient had 5/5 strength, full range of motion, neurovascular fully intact in the lower extremities.  Low suspicion for kidney stone, [place], UTI as UA is negative for signs infection, no acute findings seen on CT imaging.  Does show that she has some blood in her urine likely this is secondary due to a stone that has since resolved.  I have low suspicion for abdominal abnormality as abdomen soft nontender to palpation, she has no tenderness on my exam tolerating p.o.  Have low suspicion for dissection of the aortic aneurysm as presentation atypical of etiology.  Plan-  Left-sided flank pain since  resolved-likely patient passed a kidney stone, will recommend over-the-counter pain medications, follow-up with urology for further evaluation.  Gave strict return precautions.  Vital signs have remained stable, no indication for hospital admission.    Patient given at home care as well strict return precautions.  Patient verbalized that they understood agreed to said plan.  Final Clinical Impression(s) / ED Diagnoses Final diagnoses:  Left flank tenderness    Rx / DC Orders ED Discharge Orders     None        Marcello Fennel, PA-C 04/10/21 1543    Davonna Belling, MD 04/11/21 [phone]

## 2021-04-11 ENCOUNTER — Encounter: Payer: Self-pay | Admitting: Internal Medicine

## 2021-04-11 NOTE — Assessment & Plan Note (Signed)
Sleep habits have improved and chronic insomnia problem seems to have improved since COVID infection.  Now with less stress as she leaves political life, hopefully she will do well without medication.

## 2021-04-11 NOTE — Assessment & Plan Note (Signed)
She has maintained significant weight loss since her sleep studies and obstructive apnea no longer seems to be an issue.

## 2021-05-04 ENCOUNTER — Encounter: Payer: Medicare HMO | Admitting: Obstetrics and Gynecology

## 2021-05-20 ENCOUNTER — Encounter: Payer: Self-pay | Admitting: Internal Medicine

## 2021-05-20 NOTE — Assessment & Plan Note (Signed)
Counseling and support continue.  She manages sleep issues better than years ago. Plan-no changes.

## 2021-05-20 NOTE — Assessment & Plan Note (Signed)
Followed by PCP

## 2021-05-25 NOTE — Progress Notes (Signed)
HPI Female never smoker followed for allergic Rhinitis/conjunctivitis, Insomnia, minimal OSA/treated with weight loss-failed CPAP and Provent nasal valves, complicated by history of migraine, hypertension, GERD, Thyroid Cancer/ sgy/ RAI/ Hypothyroid, , Covid infection 01/2019, Carotid Artery Disease, R Trigeminal Neuralgia, DM2, Chronic insomnia, unsuccessfull with multiple treatments. Mild OSA and intolerant of CPAP, unwilling to try others. Didn't follow through with CBT in past PFT 07/23/19- WNL- FEV1/FVC 0.77, TLC 95%, DLCO 99%. -------------------------------------------------------------------------------------------------   03/22/21- 76 year old female never smoker followed for allergic Rhinitis/conjunctivitis, Insomnia, minimal OSA/treated with weight loss(-failed CPAP and Provent nasal valves), complicated by history of Migraine, HTN, GERD, Thyroid Cancer/ sgy/ RAI/ Hypothyroid, , Covid infection 01/2019, Carotid Artery Disease, R Trigeminal Neuralgia, DM2, Chronic insomnia, unsuccessfull with multiple treatments.  Didn't follow through with CBT in past Body weight today- 168 lbs Covid vax-2 Phizer Flu vax-today -----Doing okay with sleep currently Chronic problem with insomnia has continued to be better since she had COVID infection in 2020.  Less stress since the election issues are resolved.  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 No recurrence of thyroid cancer-long-term endocrine follow-up.  05/26/21- 76 year old female never smoker followed for allergic Rhinitis/conjunctivitis, Insomnia, minimal OSA/treated with weight loss(-failed CPAP and Provent nasal valves), complicated by history of Migraine, HTN, GERD, Thyroid Cancer/ sgy/ RAI/ Hypothyroid, , Covid infection 01/2019, Carotid Artery Disease, R Trigeminal Neuralgia, DM2, Kidney Stone,  Chronic insomnia, unsuccessfull with multiple treatments.  Didn't follow through with CBT in past Body weight today- 172 lbs Covid vax-2 Phizer Flu  vax-had ------Patient is doing good, no concerns Increased watery nose in the last 2 weeks with question of whether spring pollens are coming early.  No wheeze or cough. Sleep pattern is stable with bedtime usually around 1 AM.  She has been waking with pain isolated to her knees and has found relief with a topical joint pain product called "blue emu".  Not clear why this is developing during sleep although sleep testing in the past did show some restless legs so maybe she is kicking.  Sleeps alone.  Review of Systems-see HPI + = positive Constitutional:     +weight loss-diet, night sweats, fevers, chills, fatigue, lassitude.  + insomnia HEENT:   +headaches, difficulty swallowing, tooth/dental problems, +sore throat,       No- sneezing, itching, no-ears itch,+nasal congestion, post nasal drip,  CV:  No-   chest pain, orthopnea, PND, swelling in lower extremities, anasarca, dizziness, palpitations Resp: +shortness of breath with exertion or at rest.           productive cough, non-productive cough,  No-  coughing up of blood.              No-   change in color of mucus.  No- wheezing.   Skin: No-   rash or lesions. GI:  No-   heartburn, indigestion, abdominal pain, nausea, vomiting,  GU: MS:  No-   joint pain or swelling.   Neuro- :  Psych:  No- change in mood or affect. Some- depression or anxiety.  No memory loss.   Physical Exam  General- Alert, Oriented, Affect-appropriate, very pleasant,  Skin- rash-none, lesions- none, excoriation- none Lymphadenopathy- none Head- atraumatic            Eyes- Gross vision intact, PERRLA, conjunctivae clear secretions            Ears- Canals-, TMs ok,             Nose- clear , No- sores,  no -Septal dev,  mucus, polyps, erosion, perforation.             Throat- Mallampati III , mucosa clear-not red , drainage- none, tonsils- atrophic   Neck- flexible , trachea midline, no stridor , thyroid + incision scar barely visible, carotid- +I don't hear bruit  today Chest - symmetrical excursion , unlabored           Heart/CV- RRR , no murmur , no gallop  , no rub, nl s1 s2                           - JVD- none , edema- none, stasis changes- none, varices- none           Lung- clear, wheeze- none, cough- none, dullness-none, rub- none           Chest wall-  Abd- Br/ Gen/ Rectal- Not done, not indicated Extrem- cyanosis- none, clubbing, none, atrophy- none, strength- nl Neuro- grossly intact to observation

## 2021-05-26 ENCOUNTER — Other Ambulatory Visit: Payer: Self-pay

## 2021-05-26 ENCOUNTER — Ambulatory Visit: Payer: Medicare HMO | Admitting: Internal Medicine

## 2021-05-26 ENCOUNTER — Encounter: Payer: Self-pay | Admitting: Internal Medicine

## 2021-05-26 DIAGNOSIS — J302 Other seasonal allergic rhinitis: Secondary | ICD-10-CM | POA: Diagnosis not present

## 2021-05-26 DIAGNOSIS — F5101 Primary insomnia: Secondary | ICD-10-CM

## 2021-05-26 DIAGNOSIS — J3089 Other allergic rhinitis: Secondary | ICD-10-CM

## 2021-05-26 NOTE — Patient Instructions (Signed)
We can continue current meds  Work on sleep habits  Please call if we can help

## 2021-05-26 NOTE — Assessment & Plan Note (Signed)
Mild exacerbation may reflect recent warm weather.  Discussed usual OTC antihistamines and nasal steroid spray as needed.

## 2021-05-26 NOTE — Assessment & Plan Note (Signed)
Continues to say sleep has been better since she had COVID 2 years ago.  I think getting away from the stress of her political involvement has helped.  We talk about it each visit but she is not asking for medication.

## 2021-05-29 ENCOUNTER — Other Ambulatory Visit: Payer: Self-pay | Admitting: Internal Medicine

## 2021-05-29 DIAGNOSIS — C73 Malignant neoplasm of thyroid gland: Secondary | ICD-10-CM

## 2021-06-01 ENCOUNTER — Telehealth: Payer: Self-pay | Admitting: Internal Medicine

## 2021-06-01 MED ORDER — BENZONATATE 200 MG PO CAPS: 200.0000 mg | ORAL_CAPSULE | Freq: Three times a day (TID) | ORAL | 1 refills | Status: AC | PRN

## 2021-06-01 MED ORDER — AZITHROMYCIN 250 MG PO TABS: ORAL_TABLET | ORAL | 0 refills | Status: DC

## 2021-06-01 MED ORDER — MOLNUPIRAVIR EUA 200MG CAPSULE: 4.0000 | ORAL_CAPSULE | Freq: Two times a day (BID) | ORAL | 0 refills | Status: AC

## 2021-06-01 NOTE — Telephone Encounter (Signed)
Day 3 of sore throat, tight chest, chills.  Sending Zpak and Tessalon now. To Covid test.

## 2021-06-01 NOTE — Telephone Encounter (Signed)
Now testing positive Covid. Sending molnupiravir

## 2021-07-10 ENCOUNTER — Other Ambulatory Visit: Payer: Self-pay | Admitting: Internal Medicine

## 2021-07-10 DIAGNOSIS — C73 Malignant neoplasm of thyroid gland: Secondary | ICD-10-CM

## 2021-07-28 ENCOUNTER — Encounter: Payer: Self-pay | Admitting: Internal Medicine

## 2021-07-28 ENCOUNTER — Ambulatory Visit: Payer: Medicare HMO | Admitting: Internal Medicine

## 2021-07-28 ENCOUNTER — Other Ambulatory Visit: Payer: Self-pay

## 2021-07-28 VITALS — BP 128/76 | HR 72 | Ht 62.0 in | Wt 171.8 lb

## 2021-07-28 DIAGNOSIS — E890 Postprocedural hypothyroidism: Secondary | ICD-10-CM | POA: Diagnosis not present

## 2021-07-28 DIAGNOSIS — C73 Malignant neoplasm of thyroid gland: Secondary | ICD-10-CM | POA: Diagnosis not present

## 2021-07-28 DIAGNOSIS — E559 Vitamin D deficiency, unspecified: Secondary | ICD-10-CM

## 2021-07-28 LAB — VITAMIN D 25 HYDROXY (VIT D DEFICIENCY, FRACTURES): VITD: 29.35 ng/mL — ABNORMAL LOW (ref 30.00–100.00)

## 2021-07-28 LAB — T4, FREE: Free T4: 0.98 ng/dL (ref 0.60–1.60)

## 2021-07-28 LAB — TSH: TSH: 3.75 u[IU]/mL (ref 0.35–5.50)

## 2021-07-28 NOTE — Patient Instructions (Signed)
Please stop at the lab. ? ?Please continue Synthroid 88 alternating with 75 mcg every other day. ? ?Take the thyroid hormone every day, with water, at least 30 minutes before breakfast, separated by at least 4 hours from: ?- acid reflux medications ?- calcium ?- iron ?- multivitamins ? ?Continue calcium 500 mg with dinner. You may take an extra calcium tablet as needed. ? ?Continue vitamin D 4000 units daily. ? ?Please come back for a follow-up appointment in 6 months. ?

## 2021-07-28 NOTE — Progress Notes (Signed)
Patient ID: Teresa Molina, female   DOB: 06/30/1945, 76 y.o.   MRN: [id number] ? ?This visit occurred during the SARS-CoV-2 public health emergency.  Safety protocols were in place, including screening questions prior to the visit, additional usage of staff PPE, and extensive cleaning of exam room while observing appropriate contact time as indicated for disinfecting solutions.  ? ?HPI  ?Teresa Molina is a 76 y.o.-year-old female, initially referred by Dr. Rush Farmer for f/u for papillary thyroid cancer, postsurgical hypothyroidism, hypocalcemia, vitamin D insufficiency. Last visit 6 months ago. ?PCP: Dr. Shelia Media. ? ?Interim history: ?She has knee pain-using Blue Emu cream. ?She had Covid19 in 05/2021 - worse than the prev. Infection. She felt a swelling in the R side of her neck at that time. ?She has some fatigue.  She feels she gained weight recently. ? ?Reviewed her thyroid cancer cancer history: ?Patient has been found to have an incidental thyroid nodule during a recent carotid ultrasound, on 03/10/2015. The nodule was seen in the left lobe and measured 0.9 x 0.8 x 0.9 centimeters. ? ?Thyroid U/S (05/23/2015): Left lower pole solid hypoechoic nodule measures 10 x 8 x 8 mm, nonspecific. There appears to be minor associated microcalcification. ? ? ? ?L nodule was small, but hypoechoic and with possible small calcifications >> I suggested FNA. ? ?Adequacy Reason ?Satisfactory For Evaluation. ?Diagnosis ?THYROID, LEFT LOBE INFERIOR, FINE NEEDLE ASPIRATION (SPECIMEN 1 OF 1, COLLECTED ON 06/07/2015): ?POSITIVE FOR PAPILLARY THYROID CARCINOMA ([place] CATEGORY VI). ?[name] MD ?Pathologist, Electronic Signature ?(Case signed 06/08/2015) ?Specimen Clinical Information ?Left lower pole solid hypoechoic nodule measures 10 x 8 x 8 mm, nonspecific, There appears to be minor associated ?microcalcification ?Source ?Thyroid, Fine Needle Aspiration, Left Lobe Inferior, (Specimen 1 of 1, collected on 06/07/15 ) ? ?She  had total thyroidectomy by Dr. Harlow Asa on 07/21/2015. ?Final pathology showed: ?Diagnosis ?Thyroid, thyroidectomy, total ?- PAPILLARY CARCINOMA, CLASSIC VARIANT, SPANNING 1.1 CM. ?- EXTRATHYROIDAL EXTENSION PRESENT. ?- RESECTION MARGINS ARE NEGATIVE. ?- NO LYMPHOVASCULAR INVASION. ?- PARATHYROID TISSUE PRESENT. ?- SEE ONCOLOGY TABLE. ?Microscopic Comment ?THYROID ?Specimen: Total thyroid. ?Procedure (including lymph node sampling if applicable): Total thyroidectomy. ?Specimen Integrity (intact/fragmented): Intact. ?Tumor focality: Unifocal. ?Dominant tumor: ?Maximum tumor size (cm): 1.1 cm. ?Tumor laterality: Left. ?Histologic type (including subtype and/or unique features as applicable): Papillary carcinoma, classic variant. ?Tumor capsule: Partial. ?Extrathyroidal extension: Present. ?Capsular invasion with degree of invasion if present: N/A. ?Margins: Negative. ?Lymphatic or vascular invasion: Not identified. ?Lymph nodes: # examined 0; # positive; 0 ?TNM code: pT3, pNX ?Non-neoplastic thyroid: Nodular hyperplasia, benign parathyroid tissue. ? ?10/19/2015: RAI tx 123 mCi ?10/28/2015: post-tx WBS: No metastasis ?10/25/2016: Neck U/S:  no residual thyroid tissue or adenopathy ? ?Thyroglobulin levels and ATA antibodies are undetectable: ?Lab Results  ?Component Value Date  ? THYROGLB <0.1 (L) 07/27/2020  ? THYROGLB <0.1 (L) 07/22/2019  ? THYROGLB <0.1 (L) 07/23/2018  ? THYROGLB <0.1 (L) 10/25/2017  ? THYROGLB 0.1 (L) 06/07/2017  ? THYROGLB <0.1 (L) 10/25/2016  ? THYROGLB 0.3 (L) 11/07/2015  ? THGAB <1 07/27/2020  ? THGAB <1 07/22/2019  ? THGAB <1 07/23/2018  ? THGAB <1 10/25/2017  ? THGAB <1 06/07/2017  ? THGAB <1 10/25/2016  ? THGAB <1 11/07/2015  ? ?Postsurgical hypothyroidism: ?Pt is on Synthroid d.a.w. 88 alternating with 75 mcg every other day: ?- in am ?- fasting ?- at least 30 min from b'fast ?- no Fe, MVI, PPIs ?- + calcium with dinner ?- not on Biotin ? ?Reviewed her TFTs: ?Lab Results  ?  Component Value Date   ? TSH 2.07 01/27/2021  ? TSH 0.67 07/27/2020  ? TSH 1.15 01/27/2020  ? TSH 0.73 09/11/2019  ? TSH 0.34 (L) 07/22/2019  ? TSH 0.54 01/23/2019  ? TSH 0.37 07/23/2018  ? TSH 0.96 10/25/2017  ? TSH 3.34 06/07/2017  ? TSH 2.29 10/25/2016  ? FREET4 0.97 01/27/2021  ? FREET4 1.11 07/27/2020  ? FREET4 1.13 01/27/2020  ? FREET4 1.11 09/11/2019  ? FREET4 1.35 07/22/2019  ? FREET4 1.21 01/23/2019  ? FREET4 1.19 07/23/2018  ? FREET4 1.05 10/25/2017  ? FREET4 1.06 06/07/2017  ? FREET4 0.87 10/25/2016  ?04/05/2017: TSH 7.87 ?01/02/2016: TSH 0.22. ?03/07/2015: TSH 3.22, fT4 0.99  ? ?She has postsurgical hypocalcemia: ? ?She had a Calcium of 7.4 postop >> started calcium carbonate but she came off afterwards.  We restarted this 07/2018: 500 mg with dinner.  At last visit she was not taking this consistently, only 1-2 times a week.  I did advise her to start taking this daily. ? ?Reviewed calcium levels: ?05/03/2021: Ionized calcium 4.92 (4.6-5.3) ?Lab Results  ?Component Value Date  ? CALCIUM 8.5 (L) 04/10/2021  ? CALCIUM 8.8 11/07/2015  ? CALCIUM 7.4 (L) 07/22/2015  ?11/03/2020: Ionized calcium 4.84 (4.6-5.3), calcium 8.9 (8.6-10.4), PTH 25.5 ?01/27/2020: Ionized calcium 4.69 ?07/22/2019: Ionized calcium 4.27 ?01/21/2019: Ionized calcium 4.75  ?07/23/2018: Ionized calcium 4.66 (4.8-5.6) ?04/08/2017: Calcium 8.3 ? ?Magnesium level was previously low, then normal: ?Lab Results  ?Component Value Date  ? MG 1.6 07/27/2020  ?05/05/2020: Magnesium 1.7 (1.8-2.4) ? ?She has vitamin D insufficiency: ?Lab Results  ?Component Value Date  ? VD25OH 22.13 (L) 01/27/2021  ? VD25OH 26.2 (L) 07/27/2020  ? VD25OH 26.1 (L) 01/27/2020  ? VD25OH 33.36 07/22/2019  ? VD25OH 32.69 07/23/2018  ?05/05/2020 vitamin D 28 ? ?At last visit she was on 2000 units vitamin D daily.  We increased the dose to 4000 units daily.  At last visit she was missing doses. Now taking it consistently. ? ?Pt denies: ?- feeling nodules in neck ?- hoarseness ?- dysphagia ?-  choking ?- SOB with lying down ? ?No FH of thyroid ds. No FH of thyroid cancer. No h/o radiation tx to head or neck. ?No herbal supplements. No Biotin use. No recent steroids use.  ? ?She also has HTN, HL, GERD, sleep apnea. ? ?She also has diabetes, managed by PCP.  Latest HbA1c available for review: 7.2%.  ?Started Metformin >> could not tolerate it. ?She had yeast inf's with [place]. ?Now on Ozempic. Had nausea with it >> improved after backing off the dose >> Trulicity now. ?No results found for: HGBA1C ? ?ROS: ?+ see HPI ?- rash on chest ? ?I reviewed pt's medications, allergies, PMH, social hx, family hx, and changes were documented in the history of present illness. Otherwise, unchanged from my initial visit note. ? ?Past Medical History:  ?Diagnosis Date  ? Allergic conjunctivitis   ? ALLERGIC RHINITIS   ? Breast cyst   ? left breast - ultrasound benign 2010  ? Cancer [facility])   ? Thyroid  ? COVID-19   ? Esophageal reflux   ? Food allergy   ? angioedema > strawberries  ? GERD (gastroesophageal reflux disease)   ? Hypertension   ? Insomnia   ? OSA on CPAP   ? NPSG 08-03-09: AHI 17.6; CPAP 12/ AHI 0; PLMA- mild does not wear cpap   ? PONV (postoperative nausea and vomiting)   ? ?Past Surgical History:  ?Procedure Laterality Date  ?  BREAST SURGERY  2013  ? Rt breast mass excision  ? CATARACT EXTRACTION, BILATERAL    ? CHOLECYSTECTOMY  1991  ? DILATION AND CURETTAGE OF UTERUS    ? ROTATOR CUFF REPAIR  2006  ? squamous cell excised    ? THYROIDECTOMY N/A 07/21/2015  ? Procedure: TOTAL THYROIDECTOMY;  Surgeon: Armandina Gemma, MD;  Location: [facility];  Service: General;  Laterality: N/A;  ? TONSILLECTOMY  1952 - approximate  ? TOTAL ABDOMINAL HYSTERECTOMY  1978  ? ?Social History  ? ?Social History  ? Marital Status: Divorced  ?  Spouse Name: N/A  ? Number of Children: 2  ? ?Occupational History  ? Retired from [employer]   ?  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 ?Social History Main  Topics  ? Smoking status: Never Smoker   ? Smokeless tobacco: Never Used  ? Alcohol Use: 0.0 oz/week  ?  0 Standard drinks or equivalent per week  ?   Comment: RARELY  ? Drug Use: No  ? ?Current Outpatient Medi

## 2021-07-31 LAB — THYROGLOBULIN LEVEL: Thyroglobulin: <0.1 ng/mL — ABNORMAL LOW

## 2021-07-31 LAB — THYROGLOBULIN ANTIBODY: Thyroglobulin Ab: <1 IU/mL (ref <=1)

## 2021-08-01 MED ORDER — LEVOTHYROXINE SODIUM 88 MCG PO TABS: ORAL_TABLET | ORAL | 3 refills | Status: DC

## 2021-08-03 ENCOUNTER — Ambulatory Visit
Admission: RE | Admit: 2021-08-03 | Discharge: 2021-08-03 | Disposition: A | Discharge status: Home / Self Care | Payer: Medicare HMO | Source: Ambulatory Visit | Attending: Internal Medicine | Admitting: Internal Medicine

## 2021-08-03 DIAGNOSIS — C73 Malignant neoplasm of thyroid gland: Secondary | ICD-10-CM

## 2021-08-10 NOTE — Progress Notes (Signed)
?HPI ?Female never smoker followed for allergic Rhinitis/conjunctivitis, Insomnia, minimal OSA/treated with weight loss-failed CPAP and Provent nasal valves, complicated by history of migraine, hypertension, GERD, Thyroid Cancer/ sgy/ RAI/ Hypothyroid, , Covid infection 01/2019, Carotid Artery Disease, R Trigeminal Neuralgia, DM2, ?Chronic insomnia, unsuccessfull with multiple treatments. Mild OSA and intolerant of CPAP, unwilling to try others. Didn't follow through with CBT in past ?PFT 07/23/19- WNL- FEV1/FVC 0.77, TLC 95%, DLCO 99%. ?------------------------------------------------------------------------------------------------- ? ? ? ?05/26/21- 76 year old female never smoker followed for allergic Rhinitis/conjunctivitis, Insomnia, minimal OSA/treated with weight loss(-failed CPAP and Provent nasal valves), complicated by history of Migraine, HTN, GERD, Thyroid Cancer/ sgy/ RAI/ Hypothyroid, , Covid infection 01/2019, Carotid Artery Disease, R Trigeminal Neuralgia, DM2, Kidney Stone,  ?Chronic insomnia, unsuccessfull with multiple treatments.  Didn't follow through with CBT in past ?Body weight today- 172 lbs ?Covid vax-2 Phizer ?Flu vax-had ?------Patient is doing good, no concerns ?Increased watery nose in the last 2 weeks with question of whether spring pollens are coming early.  No wheeze or cough. ?Sleep pattern is stable with bedtime usually around 1 AM.  She has been waking with pain isolated to her knees and has found relief with a topical joint pain product called "blue emu".  Not clear why this is developing during sleep although sleep testing in the past did show some restless legs so maybe she is kicking.  Sleeps alone. ? ?08/11/21- 76 year old female never smoker followed for allergic Rhinitis/conjunctivitis, Insomnia, minimal OSA/treated with weight loss(-failed CPAP and Provent nasal valves), complicated by history of Migraine, HTN, GERD, Thyroid Cancer/ sgy/ RAI/ Hypothyroid, , Covid infection  01/2019, Jan2023/ molnupiravir,  Carotid Artery Disease, ? R Trigeminal Neuralgia, DM2, Kidney Stone,  ?Chronic insomnia, unsuccessfull with multiple treatments.  Didn't follow through with CBT in past ?Body weight today- 172 lbs ?Covid vax-2 Phizer ?Flu vax-had ?-----Patient had covid in Jan and is still having fatigue, breathing is a little worse since then. Good with daily activities. Denies cough ?Complains of brain fog/ weariness suspicious for "long covid" after her second infection. No significant cough or wheeze. Thyroid recently checked. ?Concerned about slightly tender nodule inside L cheek. ? ?Review of Systems-see HPI + = positive ?Constitutional:     +weight loss-diet, night sweats, fevers, chills, fatigue, lassitude.  ?+ insomnia ?HEENT:   +headaches, difficulty swallowing, tooth/dental problems, +sore throat,  ?     No- sneezing, itching, no-ears itch,+nasal congestion, post nasal drip,  ?CV:  No-   chest pain, orthopnea, PND, swelling in lower extremities, anasarca, dizziness, palpitations ?Resp: +shortness of breath with exertion or at rest.   ?        productive cough, non-productive cough,  No-  coughing up of blood.   ?           No-   change in color of mucus.  No- wheezing.   ?Skin: No-   rash or lesions. ?GI:  No-   heartburn, indigestion, abdominal pain, nausea, vomiting,  ?GU: ?MS:  No-   joint pain or swelling.   ?Neuro- :  ?Psych:  No- change in mood or affect. Some- depression or anxiety.  No memory loss. ? ? Physical Exam ? General- Alert, Oriented, Affect-appropriate, very pleasant,  ?Skin- rash-none, lesions- none, excoriation- none ?Lymphadenopathy- none ?Head- atraumatic ?           Eyes- Gross vision intact, PERRLA, conjunctivae clear secretions ?           Ears- Canals-, TMs ok,  ?  Nose- clear , No- sores,  no -Septal dev, mucus, polyps, erosion, perforation.  ?           Throat- Mallampati III , mucosa clear-not red , drainage- none, tonsils- atrophic     +Barely palpable  nodule in L cheek ?Neck- flexible , trachea midline, no stridor , thyroid + incision scar barely visible, carotid- +I don't hear bruit today ?Chest - symmetrical excursion , unlabored ?          Heart/CV- RRR , no murmur , no gallop  , no rub, nl s1 s2 ?                          - JVD- none , edema- none, stasis changes- none, varices- none ?          Lung- clear, wheeze- none, cough- none, dullness-none, rub- none ?          Chest wall-  ?Abd- ?Br/ Gen/ Rectal- Not done, not indicated ?Extrem- cyanosis- none, clubbing, none, atrophy- none, strength- nl ?Neuro- grossly intact to observation ? ? ? ? ? ?   ? ? ? ? ?

## 2021-08-11 ENCOUNTER — Ambulatory Visit: Payer: Medicare HMO | Admitting: Internal Medicine

## 2021-08-11 ENCOUNTER — Encounter: Payer: Self-pay | Admitting: Internal Medicine

## 2021-08-11 VITALS — BP 120/72 | HR 68 | Temp 98.2°F | Ht 63.0 in | Wt 172.4 lb

## 2021-08-11 DIAGNOSIS — K118 Other diseases of salivary glands: Secondary | ICD-10-CM

## 2021-08-11 DIAGNOSIS — F5101 Primary insomnia: Secondary | ICD-10-CM | POA: Diagnosis not present

## 2021-08-11 DIAGNOSIS — U071 COVID-19: Secondary | ICD-10-CM

## 2021-08-11 NOTE — Assessment & Plan Note (Signed)
Had 2 Phizer vax ?Now ? Long covid brain fog/ weariness.  ?Plan- naps when needed ?

## 2021-08-11 NOTE — Patient Instructions (Addendum)
Order- referral to [facility] ENT- Dr Dorathy Kinsman group    Salivary mass ? ?Please call if we can help ?

## 2021-08-11 NOTE — Assessment & Plan Note (Signed)
Sleeping fairly well at night. Increased daytime fatigue is subtle but may be long covid. ?Plan- naps when needed. Observation for now ?

## 2021-09-20 ENCOUNTER — Other Ambulatory Visit: Payer: Self-pay | Admitting: *Deleted

## 2021-09-20 DIAGNOSIS — I6523 Occlusion and stenosis of bilateral carotid arteries: Secondary | ICD-10-CM

## 2021-10-02 ENCOUNTER — Other Ambulatory Visit: Payer: Medicare HMO

## 2021-10-03 ENCOUNTER — Other Ambulatory Visit (INDEPENDENT_AMBULATORY_CARE_PROVIDER_SITE_OTHER): Payer: Medicare HMO

## 2021-10-03 DIAGNOSIS — E559 Vitamin D deficiency, unspecified: Secondary | ICD-10-CM | POA: Diagnosis not present

## 2021-10-03 DIAGNOSIS — E890 Postprocedural hypothyroidism: Secondary | ICD-10-CM

## 2021-10-03 LAB — T4, FREE: Free T4: 1.31 ng/dL (ref 0.60–1.60)

## 2021-10-03 LAB — TSH: TSH: 0.81 u[IU]/mL (ref 0.35–5.50)

## 2021-10-03 LAB — VITAMIN D 25 HYDROXY (VIT D DEFICIENCY, FRACTURES): VITD: 50.22 ng/mL (ref 30.00–100.00)

## 2021-10-04 ENCOUNTER — Encounter: Payer: Self-pay | Admitting: Vascular Surgery

## 2021-10-04 ENCOUNTER — Ambulatory Visit (INDEPENDENT_AMBULATORY_CARE_PROVIDER_SITE_OTHER): Payer: Medicare HMO

## 2021-10-04 ENCOUNTER — Ambulatory Visit: Payer: Medicare HMO | Admitting: Vascular Surgery

## 2021-10-04 VITALS — BP 150/78 | HR 66 | Temp 97.9°F | Resp 16 | Ht 63.0 in | Wt 170.6 lb

## 2021-10-04 DIAGNOSIS — I6523 Occlusion and stenosis of bilateral carotid arteries: Secondary | ICD-10-CM

## 2021-10-04 NOTE — Progress Notes (Signed)
[facility] [place]  Patient name: Teresa Molina MRN: [id number] DOB: August 19, 1945 Sex: female  REASON FOR VISIT: Follow-up carotid disease  HPI: Teresa Molina is a 76 y.o. female here today for follow-up.  Had prior studies at an outlying lab suggesting moderate carotid disease.  On my last visit with her, repeat ultrasound showed no significant carotid bifurcation disease.  She is here today for carotid duplex follow-up.  She remains completely asymptomatic.  She has no history of amaurosis fugax, aphasia, TIA or stroke.  Past Medical History:  Diagnosis Date   Allergic conjunctivitis    ALLERGIC RHINITIS    Breast cyst    left breast - ultrasound benign 2010   Cancer ([place])    Thyroid   COVID-19    Esophageal reflux    Food allergy    angioedema > strawberries   GERD (gastroesophageal reflux disease)    Hypertension    Insomnia    OSA on CPAP    NPSG 08-03-09: AHI 17.6; CPAP 12/ AHI 0; PLMA- mild does not wear cpap    PONV (postoperative nausea and vomiting)     Family History  Problem Relation Age of Onset   Other Father        brain tumor   COPD Mother     SOCIAL HISTORY: Social History   Tobacco Use   Smoking status: Never   Smokeless tobacco: Never  Substance Use Topics   Alcohol use: Yes    Alcohol/week: 0.0 standard drinks    Comment: occasional     Allergies  Allergen Reactions   Amlodipine Swelling   Diovan [Valsartan] Swelling   Lipitor [Atorvastatin Calcium] Other (See Comments)    Caused muscle paralysis in legs   Codeine Nausea And Vomiting   Compazine [Prochlorperazine Edisylate] Other (See Comments)    Facial  muscle spasms    Metformin Hcl Er     Other reaction(s): diarrhea, heartburn   Reglan [Metoclopramide] Other (See Comments)    Facial muscle spasms   Rosuvastatin Other (See Comments)    Severe joint pain. Other reaction(s): myalgia   Zoloft [Sertraline Hcl] Other (See  Comments)    tremors   Benadryl [Diphenhydramine Hcl] Palpitations    Current Outpatient Medications  Medication Sig Dispense Refill   azithromycin (ZITHROMAX) 250 MG tablet 2 today then one daily 6 tablet 0   Bempedoic Acid-Ezetimibe (NEXLIZET PO) Take by mouth 1 day or 1 dose.     benzonatate (TESSALON) 200 MG capsule Take 1 capsule (200 mg total) by mouth 3 (three) times daily as needed for cough. 30 capsule 1   calcium carbonate (OS-CAL) 600 MG tablet Take 600 mg by mouth daily.     Cholecalciferol (VITAMIN D3) 3000 units TABS Take 1 tablet by mouth daily.     ibuprofen (ADVIL,MOTRIN) 200 MG tablet Take 200 mg by mouth as needed for moderate pain. Reported on 05/11/2015     labetalol (NORMODYNE) 200 MG tablet Take 1 tablet (200 mg total) by mouth 2 (two) times daily. 60 tablet 0   levothyroxine (SYNTHROID) 88 MCG tablet TAKE ONE TABLET BY MOUTH EVERY DAY IN THE MORNING 90 tablet 3   OneTouch Delica Lancets 14G MISC USE AS DIRECTED TO CHECK BLOOD GLUCOSE DAILY E11.9     ONETOUCH VERIO test strip USE AS DIRECTED TO CHECK BLOOD GLUCOSE DAILY Y18.5     TRULICITY 6.31 SH/7.76YO SOPN Inject into the skin.     No current facility-administered medications  for this visit.    REVIEW OF SYSTEMS:  '[X]'$  denotes positive finding, '[ ]'$  denotes negative finding Cardiac  Comments:  Chest pain or chest pressure:    Shortness of breath upon exertion:    Short of breath when lying flat:    Irregular heart rhythm:        Vascular    Pain in calf, thigh, or hip brought on by ambulation:    Pain in feet at night that wakes you up from your sleep:     Blood clot in your veins:    Leg swelling:           PHYSICAL EXAM: Vitals:   10/04/21 1332 10/04/21 1336  BP: (!) 148/80 (!) 150/78  Pulse: 66   Resp: 16   Temp: 97.9 F (36.6 C)   TempSrc: Temporal   SpO2: 96%   Weight: 170 lb 9.6 oz (77.4 kg)   Height: '5\' 3"'$  (1.6 m)     GENERAL: The patient is a well-nourished female, in no acute  distress. The vital signs are documented above. CARDIOVASCULAR: Carotid arteries without bruits bilaterally PULMONARY: There is good air exchange  MUSCULOSKELETAL: There are no major deformities or cyanosis. NEUROLOGIC: No focal weakness or paresthesias are detected. SKIN: There are no ulcers or rashes noted. PSYCHIATRIC: The patient has a normal affect.  DATA:  Duplex today shows no evidence of disease in her carotid bifurcation bilaterally.  She does have slightly elevated velocities in her distal internal carotid artery below the scope skull base with velocities suggesting already to 59% stenosis  MEDICAL ISSUES: I discussed these findings at length with the patient.  I suspect that this is related to either tortuosity or other aberrant readings.  She has a completely normal-appearing bifurcations without plaque.  She does not have any other history of peripheral vascular disease.  I have recommended that we discontinue follow-up since she does not have any evidence of carotid disease.  She knows to notify should she develop any difficulties but otherwise will be seen on an as-needed basis    Rosetta Posner, MD FACS [facility] [place] Office Tel [phone]  Note: Portions of this report may have been transcribed using voice recognition software.  Every effort has been made to ensure accuracy; however, inadvertent computerized transcription errors may still be present.

## 2021-11-09 NOTE — Progress Notes (Signed)
HPI Female never smoker followed for allergic Rhinitis/conjunctivitis, Insomnia, minimal OSA/treated with weight loss-failed CPAP and Provent nasal valves, complicated by history of migraine, hypertension, GERD, Thyroid Cancer/ sgy/ RAI/ Hypothyroid, , Covid infection 01/2019, Carotid Artery Disease, R Trigeminal Neuralgia, DM2, Chronic insomnia, unsuccessfull with multiple treatments. Mild OSA and intolerant of CPAP, unwilling to try others. Didn't follow through with CBT in past PFT 07/23/19- WNL- FEV1/FVC 0.77, TLC 95%, DLCO 99%. -------------------------------------------------------------------------------------------------      08/11/21- 76 year old female never smoker followed for allergic Rhinitis/conjunctivitis, Insomnia, minimal OSA/treated with weight loss(-failed CPAP and Provent nasal valves), complicated by history of Migraine, HTN, GERD, Thyroid Cancer/ sgy/ RAI/ Hypothyroid, , Covid infection 01/2019, Jan2023/ molnupiravir,  Carotid Artery Disease,  R Trigeminal Neuralgia, DM2, Kidney Stone,  Chronic insomnia, unsuccessfull with multiple treatments.  Didn't follow through with CBT in past Body weight today- 172 lbs Covid vax-2 Phizer Flu vax-had -----Patient had covid in Jan and is still having fatigue, breathing is a little worse since then. Good with daily activities. Denies cough Complains of brain fog/ weariness suspicious for "long covid" after her second infection. No significant cough or wheeze. Thyroid recently checked. Concerned about slightly tender nodule inside L cheek.  11/10/21-  76 year old female never smoker followed for allergic Rhinitis/conjunctivitis, Insomnia, minimal OSA/treated with weight loss(-failed CPAP and Provent nasal valves), complicated by history of Migraine, HTN, GERD, Thyroid Cancer/ sgy/ RAI/ Hypothyroid, , Covid infection 01/2019, Jan2023/ molnupiravir,  Carotid Artery Disease?, R Trigeminal Neuralgia, DM2, Kidney Stone,  Chronic insomnia,  unsuccessfull with multiple treatments.  Didn't follow through with CBT in past Body weight today- 171 lbs Covid vax-2 Phizer She still does not sleep well, frequently awake at night.  Discussed again.  She does not want to take a sleep medication. Thyroid function continues under supervision by Endocrinology. She had vascular surgery evaluation and understands she does not have significant coronary artery disease. Appointment pending today with ENT to evaluate palpable mass in left cheek.  Review of Systems-see HPI + = positive Constitutional:     +weight loss-diet, night sweats, fevers, chills, fatigue, lassitude.  + insomnia HEENT:   +headaches, difficulty swallowing, tooth/dental problems, +sore throat,       No- sneezing, itching, no-ears itch,+nasal congestion, post nasal drip,  CV:  No-   chest pain, orthopnea, PND, swelling in lower extremities, anasarca, dizziness, palpitations Resp: +shortness of breath with exertion or at rest.           productive cough, non-productive cough,  No-  coughing up of blood.              No-   change in color of mucus.  No- wheezing.   Skin: No-   rash or lesions. GI:  No-   heartburn, indigestion, abdominal pain, nausea, vomiting,  GU: MS:  No-   joint pain or swelling.   Neuro- :  Psych:  No- change in mood or affect. Some- depression or anxiety.  No memory loss.   Physical Exam  General- Alert, Oriented, Affect-appropriate, very pleasant,  Skin- rash-none, lesions- none, excoriation- none Lymphadenopathy- none Head- atraumatic            Eyes- Gross vision intact, PERRLA, conjunctivae clear secretions            Ears- Canals-, TMs ok,             Nose- clear , No- sores,  no -Septal dev, mucus, polyps, erosion, perforation.  Throat- Mallampati III , mucosa clear-not red , drainage- none, tonsils- atrophic     +Barely palpable nodule in L cheek Neck- flexible , trachea midline, no stridor , thyroid + incision scar barely visible,  carotid- +I don't hear bruit today Chest - symmetrical excursion , unlabored           Heart/CV- RRR , no murmur , no gallop  , no rub, nl s1 s2                           - JVD- none , edema- none, stasis changes- none, varices- none           Lung- clear, wheeze- none, cough- none, dullness-none, rub- none           Chest wall-  Abd- Br/ Gen/ Rectal- Not done, not indicated Extrem- cyanosis- none, clubbing, none, atrophy- none, strength- nl Neuro- grossly intact to observation

## 2021-11-10 ENCOUNTER — Encounter: Payer: Self-pay | Admitting: Internal Medicine

## 2021-11-10 ENCOUNTER — Ambulatory Visit: Payer: Medicare HMO | Admitting: Internal Medicine

## 2021-11-10 DIAGNOSIS — E890 Postprocedural hypothyroidism: Secondary | ICD-10-CM

## 2021-11-10 DIAGNOSIS — F5101 Primary insomnia: Secondary | ICD-10-CM | POA: Diagnosis not present

## 2021-11-10 NOTE — Assessment & Plan Note (Signed)
We have discussed sleep habits, impact of life stress, availability of cognitive behavioral therapy referral, availability of medication.  She will reach out if she wants more active intervention.

## 2021-11-10 NOTE — Patient Instructions (Signed)
We can continue as is. If you decide you want to try medicine for insomnia please let me know.

## 2021-11-10 NOTE — Assessment & Plan Note (Signed)
Managed by Endocrinology

## 2021-12-06 ENCOUNTER — Telehealth: Payer: Self-pay | Admitting: Internal Medicine

## 2021-12-06 DIAGNOSIS — K1379 Other lesions of oral mucosa: Secondary | ICD-10-CM

## 2021-12-06 NOTE — Telephone Encounter (Signed)
I discussed evaluation of Teresa Molina's persistent left cheek mass with radiology for procedural recommendation. She would like to go ahead.  Order- schedule CT maxillofacial (full, not limited) with contrast   dx Left cheek nodule, hx thyroid cancer.

## 2021-12-14 ENCOUNTER — Other Ambulatory Visit (HOSPITAL_COMMUNITY): Payer: Medicare HMO

## 2021-12-21 ENCOUNTER — Ambulatory Visit (HOSPITAL_COMMUNITY)
Admission: RE | Admit: 2021-12-21 | Discharge: 2021-12-21 | Disposition: A | Discharge status: Home / Self Care | Payer: Medicare HMO | Source: Ambulatory Visit | Attending: Internal Medicine | Admitting: Internal Medicine

## 2021-12-21 DIAGNOSIS — K1379 Other lesions of oral mucosa: Secondary | ICD-10-CM | POA: Insufficient documentation

## 2021-12-21 LAB — POCT I-STAT CREATININE: Creatinine, Ser: 0.9 mg/dL (ref 0.44–1.00)

## 2021-12-21 MED ORDER — SODIUM CHLORIDE (PF) 0.9 % IJ SOLN
INTRAMUSCULAR | Status: AC
  Filled 2021-12-21: qty 50

## 2021-12-21 MED ORDER — IOHEXOL 300 MG/ML  SOLN
75.0000 mL | Freq: Once | INTRAMUSCULAR | Status: AC | PRN
  Administered 2021-12-21: 75 mL via INTRAVENOUS

## 2022-01-05 NOTE — Progress Notes (Signed)
HPI Female never smoker followed for allergic Rhinitis/conjunctivitis, Insomnia, minimal OSA/treated with weight loss-failed CPAP and Provent nasal valves, complicated by history of migraine, hypertension, GERD, Thyroid Cancer/ sgy/ RAI/ Hypothyroid, , Covid infection 01/2019, Carotid Artery Disease, R Trigeminal Neuralgia, DM2, Chronic insomnia, unsuccessfull with multiple treatments. Mild OSA and intolerant of CPAP, unwilling to try others. Didn't follow through with CBT in past PFT 07/23/19- WNL- FEV1/FVC 0.77, TLC 95%, DLCO 99%. ------------------------------------------------------------------------------------------------   11/10/21-  76 year old female never smoker followed for allergic Rhinitis/conjunctivitis, Insomnia, minimal OSA/treated with weight loss(-failed CPAP and Provent nasal valves), complicated by history of Migraine, HTN, GERD, Thyroid Cancer/ sgy/ RAI/ Hypothyroid, , Covid infection 01/2019, Jan2023/ molnupiravir,  Carotid Artery Disease?, R Trigeminal Neuralgia, DM2, Kidney Stone,  Chronic insomnia, unsuccessfull with multiple treatments.  Didn't follow through with CBT in past Body weight today- 171 lbs Covid vax-2 Phizer She still does not sleep well, frequently awake at night.  Discussed again.  She does not want to take a sleep medication. Thyroid function continues under supervision by Endocrinology. She had vascular surgery evaluation and understands she does not have significant coronary artery disease. Appointment pending today with ENT to evaluate palpable mass in left cheek.  01/08/22- 76 year old female never smoker followed for allergic Rhinitis/conjunctivitis, Insomnia, minimal OSA/treated with weight loss(-failed CPAP and Provent nasal valves), complicated by history of Migraine, HTN, GERD, Thyroid Cancer/ sgy/ RAI/ Hypothyroid, , Covid infection 01/2019, Jan2023/ molnupiravir,  Carotid Artery Disease?, R Trigeminal Neuralgia, DM2, Kidney Stone,  Chronic insomnia,  unsuccessfull with multiple treatments.  Didn't follow through with CBT in past Body weight today-172 lbs Covid vax-2 Phizer Left cheek mass has gotten smaller and was not seen on dedicated CT scan so it was probably just a minor salivary adenitis. Says she slept poorly last night.  Variable sleep quality.  She manages mostly with attention to sleep habits. For thyroid cancer hopefully cured.  Long-term follow-up with endocrinology. [facility] 12/21/21- FINDINGS: Osseous: No acute skeletal abnormality. Orbits: Bilateral cataract extraction.  No orbital mass or edema. Sinuses: Mild mucosal edema base of right maxillary sinus. Remaining sinuses clear. Mastoid and middle ear clear. Soft tissues: No soft tissue mass or adenopathy. The location of the palpable abnormality was not marked by the technologist. Limited intracranial: Thyroid bed partially imaged. Surgical clips from prior thyroidectomy. No mass lesion however the entire thyroid bed was not imaged on this study. No enlarged lymph nodes in the neck.  Normal pharynx. IMPRESSION: Negative for mass or adenopathy in the face. Prior thyroidectomy.   Review of Systems-see HPI + = positive Constitutional:     +weight loss-diet, night sweats, fevers, chills, fatigue, lassitude.  + insomnia HEENT:   +headaches, difficulty swallowing, tooth/dental problems, +sore throat,       No- sneezing, itching, no-ears itch,+nasal congestion, post nasal drip,  CV:  No-   chest pain, orthopnea, PND, swelling in lower extremities, anasarca, dizziness, palpitations Resp: +shortness of breath with exertion or at rest.           productive cough, non-productive cough,  No-  coughing up of blood.              No-   change in color of mucus.  No- wheezing.   Skin: No-   rash or lesions. GI:  No-   heartburn, indigestion, abdominal pain, nausea, vomiting,  GU: MS:  No-   joint pain or swelling.   Neuro- :  Psych:  No- change in mood or affect. Some-  depression or  anxiety.  No memory loss.   Physical Exam  General- Alert, Oriented, Affect-appropriate, very pleasant,  Skin- rash-none, lesions- none, excoriation- none Lymphadenopathy- none Head- atraumatic            Eyes- Gross vision intact, PERRLA, conjunctivae clear secretions            Ears- Canals-, TMs ok,             Nose- clear , No- sores,  no -Septal dev, mucus, polyps, erosion, perforation.             Throat- Mallampati III , mucosa clear-not red , drainage- none, tonsils- atrophic     +Barely palpable nodule in L cheek Neck- flexible , trachea midline, no stridor , thyroid + incision scar barely visible, carotid- +I don't hear bruit today Chest - symmetrical excursion , unlabored           Heart/CV- RRR , no murmur , no gallop  , no rub, nl s1 s2                           - JVD- none , edema- none, stasis changes- none, varices- none           Lung- clear, wheeze- none, cough- none, dullness-none, rub- none           Chest wall-  Abd- Br/ Gen/ Rectal- Not done, not indicated Extrem- cyanosis- none, clubbing, none, atrophy- none, strength- nl Neuro- grossly intact to observation

## 2022-01-08 ENCOUNTER — Encounter: Payer: Self-pay | Admitting: Internal Medicine

## 2022-01-08 ENCOUNTER — Ambulatory Visit: Payer: Medicare HMO | Admitting: Internal Medicine

## 2022-01-08 DIAGNOSIS — F5101 Primary insomnia: Secondary | ICD-10-CM

## 2022-01-08 DIAGNOSIS — C73 Malignant neoplasm of thyroid gland: Secondary | ICD-10-CM

## 2022-01-08 NOTE — Patient Instructions (Signed)
Ok to keep current appointment

## 2022-01-18 ENCOUNTER — Encounter: Payer: Self-pay | Admitting: Internal Medicine

## 2022-01-18 NOTE — Assessment & Plan Note (Signed)
Chronic problem that she more or less lives with.  Some good and bad nights.  Less stress since she got out of [employer] in [place]. We talked through general status and options when she comes but she has not indicated her desire to do more than that for now.

## 2022-01-18 NOTE — Assessment & Plan Note (Signed)
In remission, hopeful for cure.  Followed by endocrinology.

## 2022-02-07 ENCOUNTER — Ambulatory Visit: Payer: Medicare HMO | Admitting: Internal Medicine

## 2022-02-07 ENCOUNTER — Encounter: Payer: Self-pay | Admitting: Internal Medicine

## 2022-02-07 VITALS — BP 120/68 | HR 62 | Ht 63.0 in | Wt 172.6 lb

## 2022-02-07 DIAGNOSIS — C73 Malignant neoplasm of thyroid gland: Secondary | ICD-10-CM

## 2022-02-07 DIAGNOSIS — E559 Vitamin D deficiency, unspecified: Secondary | ICD-10-CM

## 2022-02-07 DIAGNOSIS — E890 Postprocedural hypothyroidism: Secondary | ICD-10-CM

## 2022-02-07 LAB — T4, FREE: Free T4: 1.19 ng/dL (ref 0.60–1.60)

## 2022-02-07 LAB — TSH: TSH: 0.66 u[IU]/mL (ref 0.35–5.50)

## 2022-02-07 NOTE — Progress Notes (Unsigned)
Patient ID: Teresa Molina, female   DOB: [birth year], 76 y.o.   MRN: [id number]  HPI  Teresa Molina is a 76 y.o.-year-old female, initially referred by Dr. Rush Farmer for f/u for papillary thyroid cancer, postsurgical hypothyroidism, hypocalcemia, vitamin D insufficiency. Last visit 6 months ago. PCP: Dr. Shelia Media.  Interim history: She has significant fatigue, feels jittery. She falls asleep quick but cannot stay asleep. She could fall asleep anywhere.  No tremors or palpitations. She has hot flushes and night sweats. Also, personality changes. The only change in her medication is starting Nexlizet approximately 3 months ago. She has constipation >> started [employer]  Reviewed her thyroid cancer cancer history: Patient has been found to have an incidental thyroid nodule during a recent carotid ultrasound, on 03/10/2015. The nodule was seen in the left lobe and measured 0.9 x 0.8 x 0.9 centimeters.  Thyroid U/S (05/23/2015): Left lower pole solid hypoechoic nodule measures 10 x 8 x 8 mm, nonspecific. There appears to be minor associated microcalcification.    L nodule was small, but hypoechoic and with possible small calcifications >> I suggested FNA.  Adequacy Reason Satisfactory For Evaluation. Diagnosis THYROID, LEFT LOBE INFERIOR, FINE NEEDLE ASPIRATION (SPECIMEN 1 OF 1, COLLECTED ON 06/07/2015): POSITIVE FOR PAPILLARY THYROID CARCINOMA ([place] CATEGORY VI). [name] MD Pathologist, Electronic Signature (Case signed 06/08/2015) Specimen Clinical Information Left lower pole solid hypoechoic nodule measures 10 x 8 x 8 mm, nonspecific, There appears to be minor associated microcalcification Source Thyroid, Fine Needle Aspiration, Left Lobe Inferior, (Specimen 1 of 1, collected on 06/07/15 )  She had total thyroidectomy by Dr. Harlow Asa on 07/21/2015. Final pathology showed: Diagnosis Thyroid, thyroidectomy, total - PAPILLARY CARCINOMA, CLASSIC VARIANT, SPANNING 1.1 CM. -  EXTRATHYROIDAL EXTENSION PRESENT. - RESECTION MARGINS ARE NEGATIVE. - NO LYMPHOVASCULAR INVASION. - PARATHYROID TISSUE PRESENT. - SEE ONCOLOGY TABLE. Microscopic Comment THYROID Specimen: Total thyroid. Procedure (including lymph node sampling if applicable): Total thyroidectomy. Specimen Integrity (intact/fragmented): Intact. Tumor focality: Unifocal. Dominant tumor: Maximum tumor size (cm): 1.1 cm. Tumor laterality: Left. Histologic type (including subtype and/or unique features as applicable): Papillary carcinoma, classic variant. Tumor capsule: Partial. Extrathyroidal extension: Present. Capsular invasion with degree of invasion if present: N/A. Margins: Negative. Lymphatic or vascular invasion: Not identified. Lymph nodes: # examined 0; # positive; 0 TNM code: pT3, pNX Non-neoplastic thyroid: Nodular hyperplasia, benign parathyroid tissue.  10/19/2015: RAI tx 123 mCi 10/28/2015: post-tx WBS: No metastasis 10/25/2016: Neck U/S:  no residual thyroid tissue or adenopathy 08/03/2021: Neck U/S: No recurrences or residual malignancy  Thyroglobulin levels and ATA antibodies are undetectable: Lab Results  Component Value Date   THYROGLB <0.1 (L) 07/28/2021   THYROGLB <0.1 (L) 07/27/2020   THYROGLB <0.1 (L) 07/22/2019   THYROGLB <0.1 (L) 07/23/2018   THYROGLB <0.1 (L) 10/25/2017   THYROGLB 0.1 (L) 06/07/2017   THYROGLB <0.1 (L) 10/25/2016   THYROGLB 0.3 (L) 11/07/2015   THGAB <1 07/28/2021   THGAB <1 07/27/2020   THGAB <1 07/22/2019   THGAB <1 07/23/2018   THGAB <1 10/25/2017   THGAB <1 06/07/2017   THGAB <1 10/25/2016   THGAB <1 11/07/2015   Postsurgical hypothyroidism: Pt is on Synthroid d.a.w. 88 daily (increased at last visit from 75 alternating with 88 mcg every other day): - in am - fasting - at least 30 min from b'fast - no Fe, MVI, PPIs - + calcium with dinner - not on Biotin  Reviewed her TFTs: Lab Results  Component Value Date   TSH 0.81 10/03/2021  TSH 3.75 07/28/2021   TSH 2.07 01/27/2021   TSH 0.67 07/27/2020   TSH 1.15 01/27/2020   TSH 0.73 09/11/2019   TSH 0.34 (L) 07/22/2019   TSH 0.54 01/23/2019   TSH 0.37 07/23/2018   TSH 0.96 10/25/2017   FREET4 1.31 10/03/2021   FREET4 0.98 07/28/2021   FREET4 0.97 01/27/2021   FREET4 1.11 07/27/2020   FREET4 1.13 01/27/2020   FREET4 1.11 09/11/2019   FREET4 1.35 07/22/2019   FREET4 1.21 01/23/2019   FREET4 1.19 07/23/2018   FREET4 1.05 10/25/2017  04/05/2017: TSH 7.87 01/02/2016: TSH 0.22. 03/07/2015: TSH 3.22, fT4 0.99   Postsurgical hypocalcemia:  She had a Calcium of 7.4 postop >> started calcium carbonate but she came off afterwards.  We restarted this 07/2018: 500 mg with dinner.  At last visit she was not taking this consistently, only 1-2 times a week.  I advised her to start taking this daily.  Reviewed calcium levels: 05/03/2021: Ionized calcium 4.92 (4.6-5.3) Lab Results  Component Value Date   CALCIUM 8.5 (L) 04/10/2021   CALCIUM 8.8 11/07/2015   CALCIUM 7.4 (L) 07/22/2015  11/03/2020: Ionized calcium 4.84 (4.6-5.3), calcium 8.9 (8.6-10.4), PTH 25.5 01/27/2020: Ionized calcium 4.69 07/22/2019: Ionized calcium 4.27 01/21/2019: Ionized calcium 4.75  07/23/2018: Ionized calcium 4.66 (4.8-5.6) 04/08/2017: Calcium 8.3  Magnesium level was previously low, then normal: Lab Results  Component Value Date   MG 1.6 07/27/2020  05/05/2020: Magnesium 1.7 (1.8-2.4)  Vitamin D insufficiency:  Reviewed her vitamin D levels: Lab Results  Component Value Date   VD25OH 50.22 10/03/2021   VD25OH 29.35 (L) 07/28/2021   VD25OH 22.13 (L) 01/27/2021   VD25OH 26.2 (L) 07/27/2020   VD25OH 26.1 (L) 01/27/2020   VD25OH 33.36 07/22/2019   VD25OH 32.69 07/23/2018  05/05/2020 vitamin D 28  At last visit she was on 2000 units vitamin D daily.  We increased the dose to 4000 units daily.  She was previously missing doses. Now taking it consistently.  Pt denies: - feeling nodules in  neck - hoarseness - dysphagia - choking  No FH of thyroid ds. No FH of thyroid cancer. No h/o radiation tx to head or neck. No herbal supplements. No Biotin use. No recent steroids use.   She also has HTN, HL, GERD, sleep apnea.  She also has diabetes, managed by PCP.  Latest HbA1c available for review: 7.2%.  Started Metformin >> could not tolerate it. She had yeast inf's with [place]. Now on Ozempic. Had nausea with it >> improved after backing off the dose >> Trulicity now >> tolerated well No results found for: "HGBA1C"  ROS: + see HPI - rash on chest  I reviewed pt's medications, allergies, PMH, social hx, family hx, and changes were documented in the history of present illness. Otherwise, unchanged from my initial visit note.  Past Medical History:  Diagnosis Date   Allergic conjunctivitis    ALLERGIC RHINITIS    Breast cyst    left breast - ultrasound benign 2010   Cancer [facility])    Thyroid   COVID-19    Esophageal reflux    Food allergy    angioedema > strawberries   GERD (gastroesophageal reflux disease)    Hypertension    Insomnia    OSA on CPAP    NPSG 08-03-09: AHI 17.6; CPAP 12/ AHI 0; PLMA- mild does not wear cpap    PONV (postoperative nausea and vomiting)    Past Surgical History:  Procedure Laterality Date   BREAST SURGERY  2013   Rt breast mass excision   CATARACT EXTRACTION, BILATERAL     CHOLECYSTECTOMY  1991   DILATION AND CURETTAGE OF UTERUS     ROTATOR CUFF REPAIR  2006   squamous cell excised     THYROIDECTOMY N/A 07/21/2015   Procedure: TOTAL THYROIDECTOMY;  Surgeon: Armandina Gemma, MD;  Location: [facility];  Service: General;  Laterality: N/A;   TONSILLECTOMY  1952 - approximate   TOTAL ABDOMINAL HYSTERECTOMY  1978   Social History   Social History   Marital Status: Divorced    Spouse Name: N/A   Number of Children: 2   Occupational History   Retired from [employer]     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     benzonatate (TESSALON) 200 MG capsule Take 1 capsule (200 mg total) by mouth 3 (three) times daily as needed for cough. 30 capsule 1   calcium carbonate (OS-CAL) 600 MG tablet Take 600 mg by mouth daily.     Cholecalciferol (VITAMIN D3) 3000 units TABS Take 1 tablet by mouth daily.     ibuprofen (ADVIL,MOTRIN) 200 MG tablet Take 200 mg by mouth as needed for moderate pain. Reported on 05/11/2015     labetalol (NORMODYNE) 200 MG tablet Take 1 tablet (200 mg total) by mouth 2 (two) times daily. 60 tablet 0   levothyroxine (SYNTHROID) 88 MCG tablet TAKE ONE TABLET BY MOUTH EVERY DAY IN THE MORNING 90 tablet 3   OneTouch Delica Lancets 19J MISC USE AS DIRECTED TO CHECK BLOOD GLUCOSE DAILY E11.9     ONETOUCH VERIO test strip USE AS DIRECTED TO CHECK BLOOD GLUCOSE DAILY Y78.2     TRULICITY 9.56 OZ/3.0QM SOPN Inject into the skin.     No current facility-administered medications on file prior to visit.   Allergies  Allergen Reactions   Amlodipine Swelling   Diovan [Valsartan] Swelling   Lipitor [Atorvastatin Calcium] Other (See Comments)    Caused muscle paralysis in legs   Codeine Nausea And Vomiting   Compazine [Prochlorperazine Edisylate] Other (See Comments)    Facial  muscle spasms    Metformin Hcl Er     Other reaction(s): diarrhea, heartburn   Reglan [Metoclopramide] Other (See Comments)    Facial muscle spasms   Rosuvastatin Other (See Comments)    Severe joint pain. Other reaction(s): myalgia   Zoloft [Sertraline Hcl] Other (See Comments)    tremors   Benadryl [Diphenhydramine Hcl] Palpitations    Family History  Problem Relation Age of Onset   Other Father        brain tumor   COPD Mother    PE: BP 120/68 (BP Location: Left Arm, Patient Position: Sitting, Cuff Size: Normal)   Pulse 62   Ht _0  (1.6 m)   Wt 172 lb 9.6 oz (78.3 kg)   SpO2 96%   BMI 30.57 kg/m   Wt Readings from Last 3 Encounters:  01/08/22 172 lb 3.2 oz (78.1 kg)  11/10/21 171 lb (77.6 kg)  10/04/21 170  lb 9.6 oz (77.4 kg)   Constitutional: Slightly overweight, in NAD Eyes:  EOMI, no exophthalmos ENT: no neck masses, no cervical lymphadenopathy Cardiovascular: RRR, No MRG Respiratory: CTA B Musculoskeletal: no deformities Skin:no rashes Neurological: no tremor with outstretched hands  ASSESSMENT: 1. Papillary thyroid cancer  2. Postsurgical hypothyroidism  3.  Postsurgical hypocalcemia  4.  Vitamin D insufficiency  5.  Low magnesium  6. DM2, non-insulin-dependent, without long-term complications  PLAN: 1. PTC -Patient with small papillary thyroid cancer, 1.1 cm, unilateral.  There was no lymphovascular invasion, however, there was extrathyroidal extension.  Because of this, she may be considered at intermediate risk, rather than low risk for recurrence.  She had RAI treatment with Thyrogen.  The post treatment whole-body scan did not show any abnormal uptake.  A thyroid ultrasound from 10/2016 showed no Rosey Bath -We are following her by thyroglobulin and antithyroglobulin antibodies.  They were undetectable at last visit -No neck masses palpated -Reviewed the latest neck ultrasound from 08/03/2021: No recurrences or residual malignancy -She is advised to come back on a 18-monthbasis, rather than once a year  2. Postsurgical hypothyroidism - latest thyroid labs reviewed with pt. >> normal: Lab Results  Component Value Date   TSH 0.81 10/03/2021  - she continues on LT4 88 mcg daily (increased at last visit) - pt feels quite fatigued at this visit.  This started approximately 1 month ago.   The only new medication is Bempedoic acid-Zetia started 2 months ago.  She has sleep apnea but this is mild.  We discussed about other possible causes for fatigue to include vitamin deficiencies or anemia.  If the TFTs today are normal, she plans to contact PCP to schedule an appt. - we discussed about taking the thyroid hormone every day, with water, >30 minutes before breakfast, separated by >4 hours from acid reflux medications, calcium, iron, multivitamins. Pt. is taking it correctly. - will check thyroid tests today: TSH and fT4 - If labs are abnormal, she will need to return for repeat TFTs in 1.5 months  3.  Postsurgical hypocalcemia -No perioral numbness or mm cramps.  In the past she described occasional muscle cramps, unclear if related to the calcium level.  I did advise her to take an extra calcium tablet when these happen to see if they resolve -Calcium and PTH levels were normal: 11/03/2020: Ionized calcium 4.84 (4.6-5.3), calcium 8.9 (8.6-10.4), PTH 25.5 -On 04/10/2021, total calcium was slightly low, 8.5 -On 05/03/2021, ionized calcium was normal, at 4.92 (4.6-5.3) -We will not repeat this today  4.  Vitamin D insufficiency -At last visit, vitamin D level was slightly low so I advised him to take the vitamin D consistently (4000 units daily) -She is another vitamin D level in 09/2021, which was normal  5.  DM2 -Managed by PCP -HbA1c was 7.2% before she was started on metformin.  However, she could not tolerate this.  She tried FIranbut developed yeast infections.  She was started on Ozempic, to which she had nausea, so before last visit she was switched to Trulicity, which she tolerates well.  Component     Latest Ref Rng 02/07/2022  T4,Free(Direct)     0.60 - 1.60 ng/dL 1.19   TSH     0.35 - 5.50 uIU/mL 0.66    TSH is excellent but I wonder if her TSH is too low for her due to her current symptoms.  We can try to allow a slightly higher TSH by returning to  alternating 75  with 88 mcg every other day.  Plan to repeat her TFTs in 1.5 months after the change.  Philemon Kingdom, MD PhD [facility] Endocrinology

## 2022-02-07 NOTE — Patient Instructions (Signed)
Please stop at the lab.  Please continue Synthroid 88 mcg every day.  Take the thyroid hormone every day, with water, at least 30 minutes before breakfast, separated by at least 4 hours from: - acid reflux medications - calcium - iron - multivitamins  Continue calcium 500 mg with dinner. You may take an extra calcium tablet as needed.  Continue vitamin D 4000 units daily.  Please come back for a follow-up appointment in 6 months.

## 2022-02-07 NOTE — Progress Notes (Signed)
HPI Female never smoker followed for allergic Rhinitis/conjunctivitis, Insomnia, minimal OSA/treated with weight loss-failed CPAP and Provent nasal valves, complicated by history of migraine, hypertension, GERD, Thyroid Cancer/ sgy/ RAI/ Hypothyroid, , Covid infection 01/2019, Carotid Artery Disease, R Trigeminal Neuralgia, DM2, Chronic insomnia, unsuccessfull with multiple treatments. Mild OSA and intolerant of CPAP, unwilling to try others. Didn't follow through with CBT in past PFT 07/23/19- WNL- FEV1/FVC 0.77, TLC 95%, DLCO 99%. ------------------------------------------------------------------------------------------------   01/08/22- 76 year old female never smoker followed for allergic Rhinitis/conjunctivitis, Insomnia, minimal OSA/treated with weight loss(-failed CPAP and Provent nasal valves), complicated by history of Migraine, HTN, GERD, Thyroid Cancer/ sgy/ RAI/ Hypothyroid, , Covid infection 01/2019, Jan2023/ molnupiravir,  Carotid Artery Disease?, R Trigeminal Neuralgia, DM2, Kidney Stone,  Chronic insomnia, unsuccessfull with multiple treatments.  Didn't follow through with CBT in past Body weight today-172 lbs Covid vax-2 Phizer Left cheek mass has gotten smaller and was not seen on dedicated CT scan so it was probably just a minor salivary adenitis. Says she slept poorly last night.  Variable sleep quality.  She manages mostly with attention to sleep habits. For thyroid cancer hopefully cured.  Long-term follow-up with endocrinology. [facility] 12/21/21- FINDINGS: Osseous: No acute skeletal abnormality. Orbits: Bilateral cataract extraction.  No orbital mass or edema. Sinuses: Mild mucosal edema base of right maxillary sinus. Remaining sinuses clear. Mastoid and middle ear clear. Soft tissues: No soft tissue mass or adenopathy. The location of the palpable abnormality was not marked by the technologist. Limited intracranial: Thyroid bed partially imaged. Surgical clips from prior  thyroidectomy. No mass lesion however the entire thyroid bed was not imaged on this study. No enlarged lymph nodes in the neck.  Normal pharynx. IMPRESSION: Negative for mass or adenopathy in the face. Prior thyroidectomy.  02/09/22- 76 year old female never smoker followed for allergic Rhinitis/conjunctivitis, Insomnia, minimal OSA/treated with weight loss(-failed CPAP and Provent nasal valves), complicated by history of Migraine, HTN, GERD, Thyroid Cancer/ sgy/ RAI/ Hypothyroid, , Covid infection 01/2019, Jan2023/ molnupiravir,  Carotid Artery Disease?, R Trigeminal Neuralgia, DM2, Kidney Stone,  Chronic insomnia, unsuccessfull with multiple treatments.  Didn't follow through with CBT in past Body weight today- Covid vax-2 Phizer -----Pt f/u for insomnia, she reports still having problems staying asleep  Has noted fatigue. Endocrinology continues to monitor and adjust thyroid Rx. Malaise today, nothing specific but has been feeling more fatigued in the last several days, daily naps.  Falls asleep easily at night but wakes and then cannot regain sleep.  Mild scratchy chest congestion recently but no productive cough or significant wheeze.  She wants to wait on flu shot until this passes.  We discussed newer options for insomnia and I would be interested in having her try an orexin receptor antagonist  If not too expensive.   Review of Systems-see HPI + = positive Constitutional:     +weight loss-diet, night sweats, fevers, chills, fatigue, lassitude.  + insomnia HEENT:   +headaches, difficulty swallowing, tooth/dental problems, +sore throat,       No- sneezing, itching, no-ears itch,+nasal congestion, post nasal drip,  CV:  No-   chest pain, orthopnea, PND, swelling in lower extremities, anasarca, dizziness, palpitations Resp: +shortness of breath with exertion or at rest.           productive cough, non-productive cough,  No-  coughing up of blood.              No-   change in color of  mucus.  No- wheezing.   Skin: No-  rash or lesions. GI:  No-   heartburn, indigestion, abdominal pain, nausea, vomiting,  GU: MS:  No-   joint pain or swelling.   Neuro- :  Psych:  No- change in mood or affect. Some- depression or anxiety.  No memory loss.   Physical Exam  General- Alert, Oriented, Affect-appropriate, very pleasant,  Skin- rash-none, lesions- none, excoriation- none Lymphadenopathy- none Head- atraumatic            Eyes- Gross vision intact, PERRLA, conjunctivae clear secretions            Ears- Canals-, TMs ok,             Nose- clear , No- sores,  no -Septal dev, mucus, polyps, erosion, perforation.             Throat- Mallampati III , mucosa clear-not red , drainage- none, tonsils- atrophic     +Barely palpable nodule in L cheek Neck- flexible , trachea midline, no stridor , thyroid + incision scar barely visible, carotid- +I don't hear bruit today Chest - symmetrical excursion , unlabored           Heart/CV- RRR , no murmur , no gallop  , no rub, nl s1 s2                           - JVD- none , edema- none, stasis changes- none, varices- none           Lung- clear, wheeze- none, cough- none, dullness-none, rub- none           Chest wall-  Abd- Br/ Gen/ Rectal- Not done, not indicated Extrem- cyanosis- none, clubbing, none, atrophy- none, strength- nl Neuro- grossly intact to observation

## 2022-02-08 MED ORDER — LEVOTHYROXINE SODIUM 88 MCG PO TABS: ORAL_TABLET | ORAL | 3 refills | Status: DC

## 2022-02-08 MED ORDER — LEVOTHYROXINE SODIUM 75 MCG PO TABS: 75.0000 ug | ORAL_TABLET | ORAL | 3 refills | Status: DC

## 2022-02-09 ENCOUNTER — Other Ambulatory Visit: Payer: Self-pay | Admitting: Internal Medicine

## 2022-02-09 ENCOUNTER — Encounter: Payer: Self-pay | Admitting: Internal Medicine

## 2022-02-09 ENCOUNTER — Ambulatory Visit: Payer: Medicare HMO | Admitting: Internal Medicine

## 2022-02-09 DIAGNOSIS — F5101 Primary insomnia: Secondary | ICD-10-CM | POA: Diagnosis not present

## 2022-02-09 DIAGNOSIS — J042 Acute laryngotracheitis: Secondary | ICD-10-CM | POA: Diagnosis not present

## 2022-02-09 DIAGNOSIS — G4733 Obstructive sleep apnea (adult) (pediatric): Secondary | ICD-10-CM

## 2022-02-09 DIAGNOSIS — J069 Acute upper respiratory infection, unspecified: Secondary | ICD-10-CM | POA: Insufficient documentation

## 2022-02-09 NOTE — Assessment & Plan Note (Signed)
May be getting a mild URI. Not definite and for now managing conservatively. Because weekend coming we will let her have a Zpak to hold as discussed.

## 2022-02-09 NOTE — Assessment & Plan Note (Deleted)
We discussed options. She is going to check with her pharmacist pricing Quviviq/ orexin receptor antagonist.

## 2022-02-09 NOTE — Assessment & Plan Note (Signed)
We discussed options. She is going to check with her pharmacist pricing Quviviq/ orexin receptor antagonist.

## 2022-02-09 NOTE — Patient Instructions (Signed)
Script sent for Zpak to hold  Ask your pharmacist about cost of Quviviq for insomnia  Stay hydrated, get rest when you can. I hope this feeling passes.

## 2022-02-14 ENCOUNTER — Telehealth: Payer: Self-pay | Admitting: Internal Medicine

## 2022-02-14 MED ORDER — DOXYCYCLINE HYCLATE 100 MG PO TABS: 100.0000 mg | ORAL_TABLET | Freq: Two times a day (BID) | ORAL | 0 refills | Status: DC

## 2022-02-14 NOTE — Telephone Encounter (Signed)
Persistent hoarse and sore throat after Zpak Plan- doxycycline. Suggested Covid test

## 2022-03-09 ENCOUNTER — Other Ambulatory Visit: Payer: Self-pay

## 2022-03-28 ENCOUNTER — Other Ambulatory Visit (INDEPENDENT_AMBULATORY_CARE_PROVIDER_SITE_OTHER): Payer: Medicare HMO

## 2022-03-28 DIAGNOSIS — E890 Postprocedural hypothyroidism: Secondary | ICD-10-CM | POA: Diagnosis not present

## 2022-03-28 LAB — T4, FREE: Free T4: 1.19 ng/dL (ref 0.60–1.60)

## 2022-03-28 LAB — TSH: TSH: 1.53 u[IU]/mL (ref 0.35–5.50)

## 2022-04-11 NOTE — Progress Notes (Signed)
HPI Female never smoker followed for allergic Rhinitis/conjunctivitis, Insomnia, minimal OSA/treated with weight loss-failed CPAP and Provent nasal valves, complicated by history of migraine, hypertension, GERD, Thyroid Cancer/ sgy/ RAI/ Hypothyroid, , Covid infection 01/2019, Carotid Artery Disease, R Trigeminal Neuralgia, DM2, Chronic insomnia, unsuccessfull with multiple treatments. Mild OSA and intolerant of CPAP, unwilling to try others. Didn't follow through with CBT in past PFT 07/23/19- WNL- FEV1/FVC 0.77, TLC 95%, DLCO 99%. ------------------------------------------------------------------------------------------------    02/09/22- 76 year old female never smoker followed for allergic Rhinitis/conjunctivitis, Insomnia, minimal OSA/treated with weight loss(-failed CPAP and Provent nasal valves), complicated by history of Migraine, HTN, GERD, Thyroid Cancer/ sgy/ RAI/ Hypothyroid, , Covid infection 01/2019, Jan2023/ molnupiravir,  Carotid Artery Disease?, R Trigeminal Neuralgia, DM2, Kidney Stone,  Chronic insomnia, unsuccessfull with multiple treatments.  Didn't follow through with CBT in past Body weight today- Covid vax-2 Phizer -----Pt f/u for insomnia, she reports still having problems staying asleep  Has noted fatigue. Endocrinology continues to monitor and adjust thyroid Rx. Malaise today, nothing specific but has been feeling more fatigued in the last several days, daily naps.  Falls asleep easily at night but wakes and then cannot regain sleep.  Mild scratchy chest congestion recently but no productive cough or significant wheeze.  She wants to wait on flu shot until this passes.  We discussed newer options for insomnia and I would be interested in having her try an orexin receptor antagonist  If not too expensive.  04/12/22- 75 year old female never smoker followed for allergic Rhinitis/conjunctivitis, Insomnia, minimal OSA/treated with weight loss(-failed CPAP and Provent nasal  valves), complicated by history of Migraine, HTN, GERD, Thyroid Cancer/ sgy/ RAI/ Hypothyroid, , Covid infection 01/2019, Jan2023/ molnupiravir,  Carotid Artery Disease?, R Trigeminal Neuralgia, DM2, Kidney Stone,  Chronic insomnia, unsuccessfull with multiple treatments.  Didn't follow through with CBT in past Body weight today-170 Covid vax-2 Phizer Flu vax-  ------Pt is doing better  Waking recently with morning headaches. Discussed possibility of looking again at  [facility]. For now she will emphasize sleep position off back and watch for other contributors to headache. Endocrinology is watching her thyroid function.   Review of Systems-see HPI + = positive Constitutional:     +weight loss-diet, night sweats, fevers, chills, fatigue, lassitude.  + insomnia HEENT:   +headaches, difficulty swallowing, tooth/dental problems, +sore throat,       No- sneezing, itching, no-ears itch,+nasal congestion, post nasal drip,  CV:  No-   chest pain, orthopnea, PND, swelling in lower extremities, anasarca, dizziness, palpitations Resp: +shortness of breath with exertion or at rest.           productive cough, non-productive cough,  No-  coughing up of blood.              No-   change in color of mucus.  No- wheezing.   Skin: No-   rash or lesions. GI:  No-   heartburn, indigestion, abdominal pain, nausea, vomiting,  GU: MS:  No-   joint pain or swelling.   Neuro- :  Psych:  No- change in mood or affect. Some- depression or anxiety.  No memory loss.   Physical Exam  General- Alert, Oriented, Affect-appropriate, very pleasant,  Skin- rash-none, lesions- none, excoriation- none Lymphadenopathy- none Head- atraumatic            Eyes- Gross vision intact, PERRLA, conjunctivae clear secretions            Ears- Canals-, TMs ok,  Nose- clear , No- sores,  no -Septal dev, mucus, polyps, erosion, perforation.             Throat- Mallampati III , mucosa clear-not red , drainage- none, tonsils-  atrophic     Neck- flexible , trachea midline, no stridor , thyroid + incision scar barely visible, carotid- +I don't hear bruit today Chest - symmetrical excursion , unlabored           Heart/CV- RRR , no murmur , no gallop  , no rub, nl s1 s2                           - JVD- none , edema- none, stasis changes- none, varices- none           Lung- clear, wheeze- none, cough- none, dullness-none, rub- none           Chest wall-  Abd- Br/ Gen/ Rectal- Not done, not indicated Extrem- cyanosis- none, clubbing, none, atrophy- none, strength- nl Neuro- grossly intact to observation

## 2022-04-12 ENCOUNTER — Encounter: Payer: Self-pay | Admitting: Internal Medicine

## 2022-04-12 ENCOUNTER — Ambulatory Visit: Payer: Medicare HMO | Admitting: Internal Medicine

## 2022-04-12 VITALS — BP 124/66 | HR 72 | Ht 62.0 in | Wt 170.6 lb

## 2022-04-12 DIAGNOSIS — G43109 Migraine with aura, not intractable, without status migrainosus: Secondary | ICD-10-CM

## 2022-04-12 DIAGNOSIS — F5101 Primary insomnia: Secondary | ICD-10-CM | POA: Diagnosis not present

## 2022-04-12 DIAGNOSIS — Z23 Encounter for immunization: Secondary | ICD-10-CM | POA: Diagnosis not present

## 2022-04-12 NOTE — Patient Instructions (Signed)
Order- flu vax senior

## 2022-04-20 ENCOUNTER — Telehealth: Payer: Self-pay | Admitting: Internal Medicine

## 2022-04-20 MED ORDER — PREDNISONE 10 MG PO TABS: ORAL_TABLET | ORAL | 0 refills | Status: DC

## 2022-04-20 MED ORDER — AZITHROMYCIN 250 MG PO TABS: ORAL_TABLET | ORAL | 0 refills | Status: DC

## 2022-04-20 NOTE — Telephone Encounter (Signed)
Coryza syndrome. Sending Zpak and prednisone to [facility]

## 2022-04-22 ENCOUNTER — Encounter: Payer: Self-pay | Admitting: Internal Medicine

## 2022-04-22 NOTE — Assessment & Plan Note (Signed)
Doing better in the last year or two. 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

## 2022-04-22 NOTE — Assessment & Plan Note (Signed)
Not sure if this is basis for her morning headache. She will discuss with Dr Shelia Media.

## 2022-04-23 ENCOUNTER — Ambulatory Visit: Payer: Medicare HMO | Admitting: Internal Medicine

## 2022-04-23 ENCOUNTER — Encounter: Payer: Self-pay | Admitting: Internal Medicine

## 2022-04-23 ENCOUNTER — Ambulatory Visit (INDEPENDENT_AMBULATORY_CARE_PROVIDER_SITE_OTHER): Payer: Medicare HMO

## 2022-04-23 VITALS — BP 128/78 | HR 61 | Ht 62.0 in | Wt 167.8 lb

## 2022-04-23 DIAGNOSIS — J209 Acute bronchitis, unspecified: Secondary | ICD-10-CM | POA: Diagnosis not present

## 2022-04-23 MED ORDER — PREDNISONE 10 MG PO TABS: ORAL_TABLET | ORAL | 0 refills | Status: DC

## 2022-04-23 MED ORDER — IPRATROPIUM-ALBUTEROL 0.5-2.5 (3) MG/3ML IN SOLN
3.0000 mL | Freq: Once | RESPIRATORY_TRACT | Status: AC
  Administered 2022-04-23: 3 mL via RESPIRATORY_TRACT

## 2022-04-23 MED ORDER — TRELEGY ELLIPTA 100-62.5-25 MCG/ACT IN AEPB: 1.0000 | INHALATION_SPRAY | Freq: Every day | RESPIRATORY_TRACT | 0 refills | Status: DC

## 2022-04-23 NOTE — Progress Notes (Signed)
HPI Female never smoker followed for allergic Rhinitis/conjunctivitis, Insomnia, minimal OSA/treated with weight loss-failed CPAP and Provent nasal valves, complicated by history of migraine, hypertension, GERD, Thyroid Cancer/ sgy/ RAI/ Hypothyroid, , Covid infection 01/2019, Carotid Artery Disease, R Trigeminal Neuralgia, DM2, Chronic insomnia, unsuccessfull with multiple treatments. Mild OSA and intolerant of CPAP, unwilling to try others. Didn't follow through with CBT in past PFT 07/23/19- WNL- FEV1/FVC 0.77, TLC 95%, DLCO 99%. ------------------------------------------------------------------------------------------------    04/12/22- 76 year old female never smoker followed for allergic Rhinitis/conjunctivitis, Insomnia, minimal OSA/treated with weight loss(-failed CPAP and Provent nasal valves), complicated by history of Migraine, HTN, GERD, Thyroid Cancer/ sgy/ RAI/ Hypothyroid, , Covid infection 01/2019, Jan2023/ molnupiravir,  Carotid Artery Disease?, R Trigeminal Neuralgia, DM2, Kidney Stone,  Chronic insomnia, unsuccessfull with multiple treatments.  Didn't follow through with CBT in past Body weight today-170 Covid vax-2 Phizer Flu vax-  ------Pt is doing better  Waking recently with morning headaches. Discussed possibility of looking again at  [facility]. For now she will emphasize sleep position off back and watch for other contributors to headache. Endocrinology is watching her thyroid function.  04/23/22- 76 year old female never smoker followed for allergic Rhinitis/conjunctivitis, Insomnia, minimal OSA/treated with weight loss(-failed CPAP and Provent nasal valves), complicated by history of Migraine, HTN, GERD, Thyroid Cancer/ sgy/ RAI/ Hypothyroid, , Covid infection 01/2019, Jan2023/ molnupiravir,  Carotid Artery Disease?, R Trigeminal Neuralgia, DM2, Kidney Stone,  Chronic insomnia, unsuccessfull with multiple treatments.  Didn't follow through with CBT in past Body weight  today- Covid vax-2 Phizer Flu vax-  Dealing with bronchitis over the past week. Worse today.  -----Pt has a dry cough, chest congestion, wheezing x 1 weeks We sent Zpak and prednisone 12/8. Wheezing and in chest.   Review of Systems-see HPI + = positive Constitutional:     +weight loss-diet, night sweats, fevers, chills, fatigue, lassitude.  + insomnia HEENT:   +headaches, difficulty swallowing, tooth/dental problems, +sore throat,       No- sneezing, itching, no-ears itch,+nasal congestion, post nasal drip,  CV:  No-   chest pain, orthopnea, PND, swelling in lower extremities, anasarca, dizziness, palpitations Resp: +shortness of breath with exertion or at rest.           productive cough, non-productive cough,  No-  coughing up of blood.              No-   change in color of mucus.  No- wheezing.   Skin: No-   rash or lesions. GI:  No-   heartburn, indigestion, abdominal pain, nausea, vomiting,  GU: MS:  No-   joint pain or swelling.   Neuro- :  Psych:  No- change in mood or affect. Some- depression or anxiety.  No memory loss.   Physical Exam  General- Alert, Oriented, Affect-appropriate, very pleasant,  Skin- rash-none, lesions- none, excoriation- none Lymphadenopathy- none Head- atraumatic            Eyes- Gross vision intact, PERRLA, conjunctivae clear secretions            Ears- Canals-, TMs ok,             Nose- clear , No- sores,  no -Septal dev, mucus, polyps, erosion, perforation.             Throat- Mallampati III , mucosa clear-not red , drainage- none, tonsils- atrophic     Neck- flexible , trachea midline, no stridor , thyroid + incision scar barely visible, carotid- +I don't hear bruit today Chest - symmetrical  excursion , unlabored           Heart/CV- RRR , no murmur , no gallop  , no rub, nl s1 s2                           - JVD- none , edema- none, stasis changes- none, varices- none           Lung- clear, wheeze- none, cough- none, dullness-none, rub- none            Chest wall-  Abd- Br/ Gen/ Rectal- Not done, not indicated Extrem- cyanosis- none, clubbing, none, atrophy- none, strength- nl Neuro- grossly intact to observation

## 2022-04-23 NOTE — Patient Instructions (Addendum)
Order- CXR    dx Asthmatic Bronchitis  Order- neb treatment Duoneb    Dx Asthmatic Bronchitis  Finish the Zpak  Stick with the Tessalon for cough. You can add an otc like Delsym  Script sent to start over with Prednisone  Order- Sample x 2 Trelegy inhaler

## 2022-04-24 MED ORDER — TRELEGY ELLIPTA 100-62.5-25 MCG/ACT IN AEPB: 1.0000 | INHALATION_SPRAY | Freq: Every day | RESPIRATORY_TRACT | 0 refills | Status: DC

## 2022-04-24 NOTE — Addendum Note (Signed)
Addended by: Meredith Staggers A on: 04/24/2022 07:55 AM   Modules accepted: Orders

## 2022-06-11 ENCOUNTER — Other Ambulatory Visit: Payer: Self-pay | Admitting: Internal Medicine

## 2022-08-01 NOTE — Progress Notes (Unsigned)
HPI Female never smoker followed for allergic Rhinitis/conjunctivitis, Insomnia, minimal OSA/treated with weight loss-failed CPAP and Provent nasal valves, complicated by history of migraine, hypertension, GERD, Thyroid Cancer/ sgy/ RAI/ Hypothyroid, , Covid infection 01/2019, Carotid Artery Disease, R Trigeminal Neuralgia, DM2, Chronic insomnia, unsuccessfull with multiple treatments. Mild OSA and intolerant of CPAP, unwilling to try others. Didn't follow through with CBT in past PFT 07/23/19- WNL- FEV1/FVC 0.77, TLC 95%, DLCO 99%. ------------------------------------------------------------------------------------------------   04/23/22- 77 year old female never smoker followed for allergic Rhinitis/conjunctivitis, Insomnia, minimal OSA/treated with weight loss(-failed CPAP and Provent nasal valves), complicated by history of Migraine, HTN, GERD, Thyroid Cancer/ sgy/ RAI/ Hypothyroid,  Covid infection 01/2019, Jan2023/ molnupiravir,  Carotid Artery Disease?, R Trigeminal Neuralgia, DM2, Kidney Stone,  Chronic insomnia, unsuccessfull with multiple treatments.  Didn't follow through with CBT in past Body weight today- Covid vax-2 Phizer Flu vax-  Dealing with bronchitis over the past week. Worse today.  -----Pt has a dry cough, chest congestion, wheezing x 1 weeks We sent Zpak and prednisone 12/8. Wheezing and in chest.  08/02/22- 77 year old female never smoker followed for allergic Rhinitis/conjunctivitis, Insomnia, minimal OSA/treated with weight loss(-failed CPAP and Provent nasal valves), Bronchitis,  complicated by history of Migraine, HTN, GERD, Thyroid Cancer/ sgy/ RAI/ Hypothyroid, , Covid infection 01/2019, Jan2023/ molnupiravir,  Carotid Artery Disease?, R Trigeminal Neuralgia, DM2, Kidney Stone,  Chronic insomnia, unsuccessfull with multiple treatments.  Didn't follow through with CBT in past Body weight today- Covid vax-2 Phizer Flu vax-   At last ov c/o recurrent bronchitis> Zpak,  samples Trelegy, pred taper. We gave neb treatment. -----Pt advises she feels much better Recurrent bronchitis from this winter appears to resolve completely now.  Chest x-ray was clear. 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  She has a possible allergic response to something associated with the building.  We discussed getting an allergy assessment.  She does not want to be referred now to an allergist. Sleep issues seem to be stable without active complaint today. Goes for thyroid cancer follow-up labs next week. Daughter had partial colectomy for abnormal polyp. CXR 04/23/22- FINDINGS: The heart size and mediastinal contours are within normal limits. Both lungs are clear. No pneumothorax or pleural effusion. There are thoracic degenerative changes.  IMPRESSION: No active cardiopulmonary disease.   Review of Systems-see HPI + = positive Constitutional:     +weight loss-diet, night sweats, fevers, chills, fatigue, lassitude.  + insomnia HEENT:   +headaches, difficulty swallowing, tooth/dental problems, +sore throat,       No- sneezing, itching, no-ears itch,+nasal congestion, post nasal drip,  CV:  No-   chest pain, orthopnea, PND, swelling in lower extremities, anasarca, dizziness, palpitations Resp: +shortness of breath with exertion or at rest.           productive cough, non-productive cough,  No-  coughing up of blood.              No-   change in color of mucus.  No- wheezing.   Skin: No-   rash or lesions. GI:  No-   heartburn, indigestion, abdominal pain, nausea, vomiting,  GU: MS:  No-   joint pain or swelling.   Neuro- :  Psych:  No- change in mood or affect. Some- depression or anxiety.  No memory loss.   Physical Exam  General- Alert, Oriented, Affect-appropriate, very pleasant,  Skin- rash-none, lesions- none, excoriation- none Lymphadenopathy- none  Head-  atraumatic            Eyes- Gross vision intact, PERRLA, conjunctivae clear secretions            Ears- Canals-, TMs ok,             Nose- clear , No- sores,  no -Septal dev, mucus, polyps, erosion, perforation.             Throat- Mallampati III , mucosa clear-not red , drainage- none, tonsils- atrophic     Neck- flexible , trachea midline, no stridor , thyroid + incision scar barely visible, carotid- +I don't hear bruit today Chest - symmetrical excursion , unlabored           Heart/CV- RRR , no murmur , no gallop  , no rub, nl s1 s2                           - JVD- none , edema- none, stasis changes- none, varices- none           Lung- clear, wheeze- none, cough- none, dullness-none, rub- none           Chest wall-  Abd- Br/ Gen/ Rectal- Not done, not indicated Extrem- cyanosis- none, clubbing, none, atrophy- none, strength- nl Neuro- grossly intact to observation

## 2022-08-02 ENCOUNTER — Encounter: Payer: Self-pay | Admitting: Internal Medicine

## 2022-08-02 ENCOUNTER — Ambulatory Visit: Payer: Medicare HMO | Admitting: Internal Medicine

## 2022-08-02 VITALS — BP 124/64 | HR 76 | Ht 63.0 in | Wt 173.2 lb

## 2022-08-02 DIAGNOSIS — F5101 Primary insomnia: Secondary | ICD-10-CM | POA: Diagnosis not present

## 2022-08-02 DIAGNOSIS — J209 Acute bronchitis, unspecified: Secondary | ICD-10-CM

## 2022-08-02 DIAGNOSIS — J309 Allergic rhinitis, unspecified: Secondary | ICD-10-CM | POA: Diagnosis not present

## 2022-08-02 DIAGNOSIS — J3089 Other allergic rhinitis: Secondary | ICD-10-CM

## 2022-08-02 DIAGNOSIS — J302 Other seasonal allergic rhinitis: Secondary | ICD-10-CM

## 2022-08-02 NOTE — Patient Instructions (Signed)
Order- lab- environmental allergy panel, IgE, CBC w diff   dx Allergic rhinitis, recurrent acute  bronchitis

## 2022-08-02 NOTE — Assessment & Plan Note (Signed)
Continues efforts towards good sleep habits and occasional nap if needed.

## 2022-08-02 NOTE — Assessment & Plan Note (Signed)
Describes rhinorrhea and malaise if she spends time at a particular old building.  Suspicion is an environmental allergic reaction. Plan-Labs for allergy profile, IgE, eosinophils.

## 2022-08-08 ENCOUNTER — Ambulatory Visit: Payer: Medicare HMO | Admitting: Internal Medicine

## 2022-08-08 ENCOUNTER — Encounter: Payer: Self-pay | Admitting: Internal Medicine

## 2022-08-08 VITALS — BP 116/70 | HR 69 | Ht 63.0 in | Wt 173.0 lb

## 2022-08-08 DIAGNOSIS — E890 Postprocedural hypothyroidism: Secondary | ICD-10-CM | POA: Diagnosis not present

## 2022-08-08 DIAGNOSIS — E559 Vitamin D deficiency, unspecified: Secondary | ICD-10-CM

## 2022-08-08 DIAGNOSIS — C73 Malignant neoplasm of thyroid gland: Secondary | ICD-10-CM | POA: Diagnosis not present

## 2022-08-08 LAB — TSH: TSH: 0.31 u[IU]/mL — ABNORMAL LOW (ref 0.35–5.50)

## 2022-08-08 LAB — T4, FREE: Free T4: 1.5 ng/dL (ref 0.60–1.60)

## 2022-08-08 LAB — VITAMIN D 25 HYDROXY (VIT D DEFICIENCY, FRACTURES): VITD: 32.17 ng/mL (ref 30.00–100.00)

## 2022-08-08 NOTE — Progress Notes (Addendum)
Patient ID: Teresa Molina, female   DOB: [birth year], 77 y.o.   MRN: [id number]  HPI  Teresa Molina is a 77 y.o.-year-old female, initially referred by Dr. Rush Farmer for f/u for papillary thyroid cancer, postsurgical hypothyroidism, hypocalcemia, vitamin D insufficiency. Last visit 6 months ago. PCP: Dr. Shelia Media.  Interim history: Last visit she had significant fatigue, feeling jittery and was not able to sleep.   She also had hot flashes and night sweats.  These initially improved after decreasing her Synthroid dose, but now returned.  She also has more hair loss.  Reviewed her thyroid cancer cancer history: Patient has been found to have an incidental thyroid nodule during a recent carotid ultrasound, on 03/10/2015. The nodule was seen in the left lobe and measured 0.9 x 0.8 x 0.9 centimeters.  Thyroid U/S (05/23/2015): Left lower pole solid hypoechoic nodule measures 10 x 8 x 8 mm, nonspecific. There appears to be minor associated microcalcification.    L nodule was small, but hypoechoic and with possible small calcifications >> I suggested FNA.  Adequacy Reason Satisfactory For Evaluation. Diagnosis THYROID, LEFT LOBE INFERIOR, FINE NEEDLE ASPIRATION (SPECIMEN 1 OF 1, COLLECTED ON 06/07/2015): POSITIVE FOR PAPILLARY THYROID CARCINOMA ([place] CATEGORY VI). Teresa Molina Pathologist, Electronic Signature (Case signed 06/08/2015) Specimen Clinical Information Left lower pole solid hypoechoic nodule measures 10 x 8 x 8 mm, nonspecific, There appears to be minor associated microcalcification Source Thyroid, Fine Needle Aspiration, Left Lobe Inferior, (Specimen 1 of 1, collected on 06/07/15 )  She had total thyroidectomy by Dr. Harlow Asa on 07/21/2015. Final pathology showed: Diagnosis Thyroid, thyroidectomy, total - PAPILLARY CARCINOMA, CLASSIC VARIANT, SPANNING 1.1 CM. - EXTRATHYROIDAL EXTENSION PRESENT. - RESECTION MARGINS ARE NEGATIVE. - NO LYMPHOVASCULAR INVASION. -  PARATHYROID TISSUE PRESENT. - SEE ONCOLOGY TABLE. Microscopic Comment THYROID Specimen: Total thyroid. Procedure (including lymph node sampling if applicable): Total thyroidectomy. Specimen Integrity (intact/fragmented): Intact. Tumor focality: Unifocal. Dominant tumor: Maximum tumor size (cm): 1.1 cm. Tumor laterality: Left. Histologic type (including subtype and/or unique features as applicable): Papillary carcinoma, classic variant. Tumor capsule: Partial. Extrathyroidal extension: Present. Capsular invasion with degree of invasion if present: N/A. Margins: Negative. Lymphatic or vascular invasion: Not identified. Lymph nodes: # examined 0; # positive; 0 TNM code: pT3, pNX Non-neoplastic thyroid: Nodular hyperplasia, benign parathyroid tissue.  10/19/2015: RAI tx 123 mCi 10/28/2015: post-tx WBS: No metastasis 10/25/2016: Neck U/S:  no residual thyroid tissue or adenopathy 08/03/2021: Neck U/S: No recurrences or residual malignancy  Thyroglobulin levels and ATA antibodies are undetectable: Lab Results  Component Value Date   THYROGLB <0.1 (L) 07/28/2021   THYROGLB <0.1 (L) 07/27/2020   THYROGLB <0.1 (L) 07/22/2019   THYROGLB <0.1 (L) 07/23/2018   THYROGLB <0.1 (L) 10/25/2017   THYROGLB 0.1 (L) 06/07/2017   THYROGLB <0.1 (L) 10/25/2016   THYROGLB 0.3 (L) 11/07/2015   THGAB <1 07/28/2021   THGAB <1 07/27/2020   THGAB <1 07/22/2019   THGAB <1 07/23/2018   THGAB <1 10/25/2017   THGAB <1 06/07/2017   THGAB <1 10/25/2016   THGAB <1 11/07/2015   Postsurgical hypothyroidism: Pt is on Synthroid d.a.w. 75 alternating with 88 mcg every other day -decreased at last visit: - in am - fasting - at least 30 min from b'fast - no Fe, MVI, PPIs - + calcium with dinner - not on Biotin  Reviewed her TFTs: Lab Results  Component Value Date   TSH 1.53 03/28/2022   TSH 0.66 02/07/2022   TSH 0.81 10/03/2021   TSH  3.75 07/28/2021   TSH 2.07 01/27/2021   TSH 0.67 07/27/2020    TSH 1.15 01/27/2020   TSH 0.73 09/11/2019   TSH 0.34 (L) 07/22/2019   TSH 0.54 01/23/2019   FREET4 1.19 03/28/2022   FREET4 1.19 02/07/2022   FREET4 1.31 10/03/2021   FREET4 0.98 07/28/2021   FREET4 0.97 01/27/2021   FREET4 1.11 07/27/2020   FREET4 1.13 01/27/2020   FREET4 1.11 09/11/2019   FREET4 1.35 07/22/2019   FREET4 1.21 01/23/2019  04/05/2017: TSH 7.87 01/02/2016: TSH 0.22. 03/07/2015: TSH 3.22, fT4 0.99   Postsurgical hypocalcemia:  She had a Calcium of 7.4 postop >> started calcium carbonate but she came off afterwards.  We restarted this 07/2018: 500 mg with dinner.  At last visit she was not taking this consistently, only 1-2 times a week.  I advised her to start taking this daily.  Reviewed calcium levels: 03/15/2022: Calcium 8.7 (8.5-10.1), GFR 55 02/21/2022: Calcium 9.0, GFR 41 05/03/2021: Ionized calcium 4.92 (4.6-5.3) Lab Results  Component Value Date   CALCIUM 8.5 (L) 04/10/2021   CALCIUM 8.8 11/07/2015   CALCIUM 7.4 (L) 07/22/2015  11/03/2020: Ionized calcium 4.84 (4.6-5.3), calcium 8.9 (8.6-10.4), PTH 25.5 01/27/2020: Ionized calcium 4.69 07/22/2019: Ionized calcium 4.27 01/21/2019: Ionized calcium 4.75  07/23/2018: Ionized calcium 4.66 (4.8-5.6) 04/08/2017: Calcium 8.3  Magnesium level was previously low, then normal: Lab Results  Component Value Date   MG 1.6 07/27/2020  05/05/2020: Magnesium 1.7 (1.8-2.4)  Vitamin D insufficiency:  Reviewed her vitamin D levels: Lab Results  Component Value Date   VD25OH 50.22 10/03/2021   VD25OH 29.35 (L) 07/28/2021   VD25OH 22.13 (L) 01/27/2021   VD25OH 26.2 (L) 07/27/2020   VD25OH 26.1 (L) 01/27/2020   VD25OH 33.36 07/22/2019   VD25OH 32.69 07/23/2018  05/05/2020 vitamin D 28  At last visit she was on 2000 units vitamin D daily.  We increased the dose to 4000 units daily.  She was previously missing doses. Now taking it consistently.  Pt denies: - feeling nodules in neck - hoarseness - dysphagia -  choking  No FH of thyroid ds. No FH of thyroid cancer. No h/o radiation tx to head or neck. No herbal supplements. No Biotin use. No recent steroids use.   She also has HTN, HL, GERD, sleep apnea.  She also has diabetes, managed by PCP.  Latest HbA1c available for review: 03/15/2022: 6.1%, decreased from 7.2%.  Started Metformin >> could not tolerate it. She had yeast inf's with [place]. Previously on Ozempic. Had nausea with it >> improved after backing off the dose >> Trulicity now >> tolerated well No results found for: "HGBA1C"  ROS: + see HPI  I reviewed pt's medications, allergies, PMH, social hx, family hx, and changes were documented in the history of present illness. Otherwise, unchanged from my initial visit note.  Past Medical History:  Diagnosis Date   Allergic conjunctivitis    ALLERGIC RHINITIS    Breast cyst    left breast - ultrasound benign 2010   Cancer [facility])    Thyroid   COVID-19    Esophageal reflux    Food allergy    angioedema > strawberries   GERD (gastroesophageal reflux disease)    Hypertension    Insomnia    OSA on CPAP    NPSG 08-03-09: AHI 17.6; CPAP 12/ AHI 0; PLMA- mild does not wear cpap    PONV (postoperative nausea and vomiting)    Past Surgical History:  Procedure Laterality Date   BREAST  SURGERY  2013   Rt breast mass excision   CATARACT EXTRACTION, BILATERAL     CHOLECYSTECTOMY  1991   DILATION AND CURETTAGE OF UTERUS     ROTATOR CUFF REPAIR  2006   squamous cell excised     THYROIDECTOMY N/A 07/21/2015   Procedure: TOTAL THYROIDECTOMY;  Surgeon: Armandina Gemma, Molina;  Location: [facility];  Service: General;  Laterality: N/A;   TONSILLECTOMY  1952 - approximate   TOTAL ABDOMINAL HYSTERECTOMY  1978   Social History   Social History   Marital Status: Divorced    Spouse Name: N/A   Number of Children: 2   Occupational History   Retired from [employer]     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     benzonatate (TESSALON) 200 MG capsule Take 1 capsule (200 mg total) by mouth 3 (three) times daily as needed for cough. (Patient not taking: Reported on 08/02/2022) 30 capsule 1   calcium carbonate (OS-CAL) 600 MG tablet Take 600 mg by mouth daily.     Cholecalciferol (VITAMIN D3) 3000 units TABS Take 1 tablet by mouth daily.     Fluticasone-Umeclidin-Vilant (TRELEGY ELLIPTA) 100-62.5-25 MCG/ACT AEPB Inhale 1 puff into the lungs daily. 2 each 0   ibuprofen (ADVIL,MOTRIN) 200 MG tablet Take 200 mg by mouth as needed for moderate pain. Reported on 05/11/2015     labetalol (NORMODYNE) 200 MG tablet Take 1 tablet (200 mg total) by mouth 2 (two) times daily. 60 tablet 0   levothyroxine (SYNTHROID) 88 MCG tablet TAKE ONE TABLET BY MOUTH EVERY OTHER DAY IN THE MORNING 45 tablet 3   OneTouch Delica Lancets 99991111 MISC USE AS DIRECTED TO CHECK BLOOD GLUCOSE DAILY E11.9     ONETOUCH VERIO test strip USE AS DIRECTED TO CHECK BLOOD GLUCOSE DAILY E11.9     SYNTHROID 75 MCG tablet TAKE ONE TABLET BY MOUTH EVERY OTHER DAY 45 tablet 3   TRULICITY A999333 0000000 SOPN Inject into the skin.     No current facility-administered medications on file prior to visit.   Allergies  Allergen Reactions   Amlodipine Swelling   Diovan [Valsartan] Swelling   Lipitor [Atorvastatin Calcium] Other (See Comments)    Caused muscle paralysis in legs   Codeine Nausea And Vomiting   Compazine [Prochlorperazine Edisylate] Other (See Comments)    Facial  muscle spasms    Metformin Hcl Er     Other reaction(s): diarrhea, heartburn   Reglan [Metoclopramide]  Other (See Comments)    Facial muscle spasms   Rosuvastatin Other (See Comments)    Severe joint pain. Other reaction(s): myalgia   Zoloft [Sertraline Hcl] Other (See Comments)    tremors   Benadryl [Diphenhydramine Hcl] Palpitations   Family History  Problem Relation Age of Onset   Other Father        brain tumor   COPD Mother    PE: BP 116/70 (BP Location: Left Arm, Patient Position: Sitting, Cuff Size: Small)   Pulse 69   Ht 5\' 3"  (1.6  m)   Wt 173 lb (78.5 kg)   SpO2 99%   BMI 30.65 kg/m   Wt Readings from Last 3 Encounters:  08/08/22 173 lb (78.5 kg)  08/02/22 173 lb 3.2 oz (78.6 kg)  04/23/22 167 lb 12.8 oz (76.1 kg)   Constitutional: Slightly overweight, in NAD Eyes:  EOMI, no exophthalmos ENT: no neck masses, no cervical lymphadenopathy Cardiovascular: RRR, No MRG Respiratory: CTA B Musculoskeletal: no deformities Skin:no rashes Neurological: no tremor with outstretched hands  ASSESSMENT: 1. Papillary thyroid cancer  2. Postsurgical hypothyroidism  3.  Postsurgical hypocalcemia  4.  Vitamin D insufficiency  5.  Low magnesium  6. DM2, non-insulin-dependent, without long-term complications  PLAN: 1. PTC -Patient with small papillary thyroid cancer, 1.1 cm, unilateral.  There was no lymphovascular invasion, however, there was extrathyroidal extension.  Because of this, she may be considered at intermediate risk, rather than low risk for recurrence.  She had RAI treatment with Thyrogen.  The post treatment whole-body scan did not show any abnormal uptake.  A thyroid ultrasound from 10/2016 showed no recurrence.  Also, reviewed the latest neck ultrasound report from 08/03/2021: No recurrences or residual malignancy. -We are following her by thyroglobulin and antithyroglobulin antibodies.  They were undetectable at last check and we will recheck them today. -No neck masses palpated -She prefers to come back on a 31-month basis, rather than once a year  2.  Postsurgical hypothyroidism - latest thyroid labs reviewed with pt. >> normal: Lab Results  Component Value Date   TSH 1.53 03/28/2022  - she continues on LT4 88 alternating with 75 mcg every other day, dose decreased at last visit from 88 mcg daily as the TSH was close to the lower limit of the target range and she had increased fatigue and feeling jittery. - pt feels better on this dose, but lately she started to experience hair loss and again hot flashes - we discussed about taking the thyroid hormone every day, with water, >30 minutes before breakfast, separated by >4 hours from acid reflux medications, calcium, iron, multivitamins. Pt. is taking it correctly. - will check thyroid tests today: TSH and fT4 - If labs are abnormal, she will need to return for repeat TFTs in 1.5 months  3.  Postsurgical hypocalcemia -No perioral numbness or muscle cramps.  In the past she described occasional muscle cramps, unclear if related to the calcium level.   -Calcium and PTH levels were normal: 11/03/2020: Ionized calcium 4.84 (4.6-5.3), calcium 8.9 (8.6-10.4), PTH 25.5 -On 02/21/2022, calcium was normal, at 9.0, and on 03/15/2022, calcium was still normal, at 8.7 (8.5-10.1) -Will continue to keep an eye on this but we will not repeat the calcium level at today's visit  4.  Vitamin D insufficiency -On 4000 units vitamin D daily  -Vitamin D level was normal in 09/2021 -will recheck her vitamin D now  5.  DM2 -Managed by PCP -HbA1c was 7.2% before she was started on metformin.  However, she could not tolerate this.  She tried [place] but developed yeast infections.  -She was tried on Ozempic but she had nausea with this so she was switched to Trulicity, which she currently tolerates well -Latest HbA1c: 6.1% on 03/15/2022  Component     Latest Ref Rng 08/08/2022  TSH     0.35 - 5.50 uIU/mL 0.31 (L)   T4,Free(Direct)     0.60 - 1.60 ng/dL 1.50   Thyroglobulin     ng/mL 0.1 (L)   Comment --  Thyroglobulin Ab     < or = 1 IU/mL <1   VITD     30.00 - 100.00 ng/mL 32.17     TSH still suppressed, which could account for her hot flashes.  Will decrease the dose of Synthroid to 75 mcg daily and repeat her TFTs in 1.5 months. Vitamin D level is normal. Tg is barely detectable, at 0.1, possibly due to variation in the assay.  ATA antibodies are not elevated.  I will repeat these at next visit.  Philemon Kingdom, MD PhD [facility]

## 2022-08-08 NOTE — Patient Instructions (Signed)
Please stop at the lab.  Please continue Synthroid 88 mcg alternating with 75 mcg every other day.  Take the thyroid hormone every day, with water, at least 30 minutes before breakfast, separated by at least 4 hours from: - acid reflux medications - calcium - iron - multivitamins  Continue calcium 500 mg with dinner. You may take an extra calcium tablet as needed.  Continue vitamin D 4000 units daily.  Please come back for a follow-up appointment in 6 months.

## 2022-08-09 MED ORDER — SYNTHROID 75 MCG PO TABS: 75.0000 ug | ORAL_TABLET | Freq: Every day | ORAL | 3 refills | Status: DC

## 2022-08-10 LAB — THYROGLOBULIN LEVEL: Thyroglobulin: 0.1 ng/mL — ABNORMAL LOW

## 2022-08-10 LAB — THYROGLOBULIN ANTIBODY: Thyroglobulin Ab: <1 IU/mL (ref <=1)

## 2022-09-24 ENCOUNTER — Other Ambulatory Visit (INDEPENDENT_AMBULATORY_CARE_PROVIDER_SITE_OTHER): Payer: Medicare HMO

## 2022-09-24 DIAGNOSIS — J209 Acute bronchitis, unspecified: Secondary | ICD-10-CM

## 2022-09-24 DIAGNOSIS — J309 Allergic rhinitis, unspecified: Secondary | ICD-10-CM | POA: Diagnosis not present

## 2022-09-25 LAB — CBC WITH DIFFERENTIAL/PLATELET
Basophils Absolute: 0.1 10*3/uL (ref 0.0–0.1)
Basophils Relative: 0.7 % (ref 0.0–3.0)
Eosinophils Absolute: 0.1 10*3/uL (ref 0.0–0.7)
Eosinophils Relative: 1.6 % (ref 0.0–5.0)
HCT: 40.3 % (ref 36.0–46.0)
Hemoglobin: 13.9 g/dL (ref 12.0–15.0)
Lymphocytes Relative: 17 % (ref 12.0–46.0)
Lymphs Abs: 1.2 10*3/uL (ref 0.7–4.0)
MCHC: 34.6 g/dL (ref 30.0–36.0)
MCV: 93.1 fl (ref 78.0–100.0)
Monocytes Absolute: 0.5 10*3/uL (ref 0.1–1.0)
Monocytes Relative: 7.1 % (ref 3.0–12.0)
Neutro Abs: 5.3 10*3/uL (ref 1.4–7.7)
Neutrophils Relative %: 73.6 % (ref 43.0–77.0)
Platelets: 160 10*3/uL (ref 150.0–400.0)
RBC: 4.33 Mil/uL (ref 3.87–5.11)
RDW: 12.8 % (ref 11.5–15.5)
WBC: 7.2 10*3/uL (ref 4.0–10.5)

## 2022-09-25 LAB — IGE: IgE (Immunoglobulin E), Serum: 27 kU/L (ref <=114)

## 2022-09-27 ENCOUNTER — Encounter: Payer: Self-pay | Admitting: Internal Medicine

## 2022-09-30 NOTE — Progress Notes (Signed)
HPI Female never smoker followed for allergic Rhinitis/conjunctivitis, Insomnia, minimal OSA/treated with weight loss-failed CPAP and Provent nasal valves, complicated by history of migraine, hypertension, GERD, Thyroid Cancer/ sgy/ RAI/ Hypothyroid, , Covid infection 01/2019, Carotid Artery Disease, R Trigeminal Neuralgia, DM2, Chronic insomnia, unsuccessfull with multiple treatments. Mild OSA and intolerant of CPAP, unwilling to try others. Didn't follow through with CBT in past PFT 07/23/19- WNL- FEV1/FVC 0.77, TLC 95%, DLCO 99%. ------------------------------------------------------------------------------------------------  08/02/22- 77 year old female never smoker followed for allergic Rhinitis/conjunctivitis, Insomnia, minimal OSA/treated with weight loss(-failed CPAP and Provent nasal valves), Bronchitis,  complicated by history of Migraine, HTN, GERD, Thyroid Cancer/ sgy/ RAI/ Hypothyroid, , Covid infection 01/2019, Jan2023/ molnupiravir,  Carotid Artery Disease?, R Trigeminal Neuralgia, DM2, Kidney Stone,  Chronic insomnia, unsuccessfull with multiple treatments.  Didn't follow through with CBT in past Body weight today- Covid vax-2 Phizer Flu vax-   At last ov c/o recurrent bronchitis> Zpak, samples Trelegy, pred taper. We gave neb treatment. -----Pt advises she feels much better Recurrent bronchitis from this winter appears to resolve completely now.  Chest x-ray was clear. 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  She has a possible allergic response to something associated with the building.  We discussed getting an allergy assessment.  She does not want to be referred now to an allergist. Sleep issues seem to be stable without active complaint today. Goes for thyroid cancer follow-up labs next week. Daughter had partial colectomy for abnormal polyp. CXR  04/23/22- FINDINGS: The heart size and mediastinal contours are within normal limits. Both lungs are clear. No pneumothorax or pleural effusion. There are thoracic degenerative changes.  IMPRESSION: No active cardiopulmonary disease.   10/02/22- 77 year old female never smoker followed for allergic Rhinitis/conjunctivitis, Insomnia, minimal OSA/treated with weight loss(-failed CPAP and Provent nasal valves), Bronchitis,  complicated by history of Migraine, HTN, GERD, Thyroid Cancer/ sgy/ RAI/ Hypothyroid, , Covid infection 01/2019, Jan2023/ molnupiravir,  Carotid Artery Disease?, R Trigeminal Neuralgia, DM2, Kidney Stone,  Chronic insomnia, unsuccessfull with multiple treatments.  Didn't follow through with CBT in past -Trelegy 100,  Body weight today-175 lbs Lab 09/24/22- IgE 27, Eos 0.1 Still bothersome stuffy nose and rhinorrhea with mostly clear.  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 Endocrinology is still working on her thyroid management.  She complains of malaise, very nonspecific. Asthma/bronchitis symptoms pretty well-controlled with Trelegy used at intervals when needed.  Not needing rescue inhaler.  Respiratory events do not disturb sleep.  Review of Systems-see HPI + = positive Constitutional:     +weight loss-diet, night sweats, fevers, chills, fatigue, lassitude.  + insomnia HEENT:   +headaches, difficulty swallowing, tooth/dental problems, +sore throat,       No- sneezing, itching, no-ears itch,+nasal congestion, +post nasal drip,  CV:  No-   chest pain, orthopnea, PND, swelling in lower extremities, anasarca, dizziness, palpitations Resp: +shortness of breath with exertion or at rest.           productive cough, non-productive cough,  No-  coughing up of blood.              No-   change in color of mucus.  No- wheezing.   Skin: No-   rash or lesions. GI:  No-   heartburn, indigestion, abdominal pain,  nausea, vomiting,  GU: MS:  No-   joint pain  or swelling.   Neuro- :  Psych:  No- change in mood or affect. Some- depression or anxiety.  No memory loss.   Physical Exam  General- Alert, Oriented, Affect-appropriate, very pleasant,  Skin- rash-none, lesions- none, excoriation- none Lymphadenopathy- none Head- atraumatic            Eyes- Gross vision intact, PERRLA, conjunctivae clear secretions            Ears- Canals-, TMs ok,             Nose- clear , No- sores,  no -Septal dev, mucus, polyps, erosion, perforation.             Throat- Mallampati III , mucosa clear-not red , drainage- none, tonsils- atrophic     Neck- flexible , trachea midline, no stridor , thyroid + incision scar barely visible, carotid- +I don't hear bruit today Chest - symmetrical excursion , unlabored           Heart/CV- RRR , no murmur , no gallop  , no rub, nl s1 s2                           - JVD- none , edema- none, stasis changes- none, varices- none           Lung- clear, wheeze- none, cough- none, dullness-none, rub- none           Chest wall-  Abd- Br/ Gen/ Rectal- Not done, not indicated Extrem- cyanosis- none, clubbing, none, atrophy- none, strength- nl Neuro- grossly intact to observation

## 2022-10-02 ENCOUNTER — Encounter: Payer: Self-pay | Admitting: Internal Medicine

## 2022-10-02 ENCOUNTER — Ambulatory Visit: Payer: Medicare HMO | Admitting: Internal Medicine

## 2022-10-02 VITALS — BP 118/70 | HR 72 | Ht 63.0 in | Wt 175.2 lb

## 2022-10-02 DIAGNOSIS — J302 Other seasonal allergic rhinitis: Secondary | ICD-10-CM

## 2022-10-02 DIAGNOSIS — F5101 Primary insomnia: Secondary | ICD-10-CM

## 2022-10-02 DIAGNOSIS — J4520 Mild intermittent asthma, uncomplicated: Secondary | ICD-10-CM

## 2022-10-02 DIAGNOSIS — J3089 Other allergic rhinitis: Secondary | ICD-10-CM

## 2022-10-02 NOTE — Patient Instructions (Signed)
I will be interested in hearing what gets done with your thyroid.

## 2022-10-03 ENCOUNTER — Other Ambulatory Visit: Payer: Self-pay

## 2022-10-03 MED ORDER — SYNTHROID 75 MCG PO TABS: 75.0000 ug | ORAL_TABLET | Freq: Every day | ORAL | 3 refills | Status: DC

## 2022-10-05 ENCOUNTER — Other Ambulatory Visit (INDEPENDENT_AMBULATORY_CARE_PROVIDER_SITE_OTHER): Payer: Medicare HMO

## 2022-10-05 DIAGNOSIS — E890 Postprocedural hypothyroidism: Secondary | ICD-10-CM | POA: Diagnosis not present

## 2022-10-05 LAB — TSH: TSH: 0.38 u[IU]/mL (ref 0.35–5.50)

## 2022-10-05 LAB — T4, FREE: Free T4: 1.41 ng/dL (ref 0.60–1.60)

## 2022-10-31 NOTE — Assessment & Plan Note (Signed)
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  Discussed use of Flonase/antihistamines as needed.

## 2022-10-31 NOTE — Assessment & Plan Note (Signed)
Has Trelegy inhaler.  We again discussed appropriate use.  Most of the time she does not need anything.

## 2022-10-31 NOTE — Assessment & Plan Note (Signed)
Chronic problem has been much less important since she retired and got away from work stress. Plan-continue attention to good sleep habits.

## 2022-12-02 NOTE — Progress Notes (Unsigned)
HPI Female never smoker followed for allergic Rhinitis/conjunctivitis, Insomnia, minimal OSA/treated with weight loss-failed CPAP and Provent nasal valves, complicated by history of migraine, hypertension, GERD, Thyroid Cancer/ sgy/ RAI/ Hypothyroid, , Covid infection 01/2019, Carotid Artery Disease, R Trigeminal Neuralgia, DM2, Chronic insomnia, unsuccessfull with multiple treatments. Mild OSA and intolerant of CPAP, unwilling to try others. Didn't follow through with CBT in past PFT 07/23/19- WNL- FEV1/FVC 0.77, TLC 95%, DLCO 99%. ------------------------------------------------------------------------------------------------    10/02/22- 77 year old female never smoker followed for allergic Rhinitis/conjunctivitis, Insomnia, minimal OSA/treated with weight loss(-failed CPAP and Provent nasal valves), Bronchitis,  complicated by history of Migraine, HTN, GERD, Thyroid Cancer/ sgy/ RAI/ Hypothyroid, , Covid infection 01/2019, Jan2023/ molnupiravir,  Carotid Artery Disease?, R Trigeminal Neuralgia, DM2, Kidney Stone,  Chronic insomnia, unsuccessfull with multiple treatments.  Didn't follow through with CBT in past -Trelegy 100,  Body weight today-175 lbs Lab 09/24/22- IgE 27, Eos 0.1 Still bothersome stuffy nose and rhinorrhea with mostly clear.  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 Endocrinology is still working on her thyroid management.  She complains of malaise, very nonspecific. Asthma/bronchitis symptoms pretty well-controlled with Trelegy used at intervals when needed.  Not needing rescue inhaler.  Respiratory events do not disturb sleep.  12/04/22- 77 year old female never smoker followed for allergic Rhinitis/conjunctivitis, Insomnia, minimal OSA/treated with weight loss(-failed CPAP and Provent nasal valves), Bronchitis,  complicated by history of Migraine, HTN, GERD, Thyroid Cancer/ sgy/ RAI/ Hypothyroid, , Covid  infection 01/2019, Jan2023/ molnupiravir,  Carotid Artery Disease?, R Trigeminal Neuralgia, DM2, Kidney Stone,  Chronic insomnia, unsuccessfull with multiple treatments.  Didn't follow through with CBT in past -Trelegy 100,  Body weight today- Lab 09/24/22- IgE 27, Eos 0.1 -----Patient states she feels tired all the time, she also states she is not sleeping well-she is only getting about 3-5hrs of sleep at night  She is concerned thyroid management isn't right- c/o daytime tiredness. She will ask her endocrinologist for early recheck. "Tears up bed". Discussed past hx OSA, never treated She agrees to update HST. We discussed new trial of sleep med- Lunesta. Discussed trial of Adderall 1/2 or 1 tab of 10 mg .   Review of Systems-see HPI + = positive Constitutional:     +weight loss-diet, night sweats, fevers, chills, fatigue, lassitude.  + insomnia HEENT:   +headaches, difficulty swallowing, tooth/dental problems, +sore throat,       No- sneezing, itching, no-ears itch,+nasal congestion, +post nasal drip,  CV:  No-   chest pain, orthopnea, PND, swelling in lower extremities, anasarca, dizziness, palpitations Resp: +shortness of breath with exertion or at rest.           productive cough, non-productive cough,  No-  coughing up of blood.              No-   change in color of mucus.  No- wheezing.   Skin: No-   rash or lesions. GI:  No-   heartburn, indigestion, abdominal pain, nausea, vomiting,  GU: MS:  No-   joint pain or swelling.   Neuro- :  Psych:  No- change in mood or affect. Some- depression or anxiety.  No memory loss.   Physical Exam  General- Alert, Oriented, Affect-appropriate, very pleasant,  Skin- rash-none, lesions- none, excoriation- none Lymphadenopathy- none Head- atraumatic            Eyes- Gross vision intact, PERRLA, conjunctivae clear secretions  Ears- Canals-, TMs ok,             Nose- clear , No- sores,  no -Septal dev, mucus, polyps, erosion,  perforation.             Throat- Mallampati III , mucosa clear-not red , drainage- none, tonsils- atrophic     Neck- flexible , trachea midline, no stridor , thyroid + incision scar barely visible, carotid- +I don't hear bruit today Chest - symmetrical excursion , unlabored           Heart/CV- RRR , no murmur , no gallop  , no rub, nl s1 s2                           - JVD- none , edema- none, stasis changes- none, varices- none           Lung- clear, wheeze- none, cough- none, dullness-none, rub- none           Chest wall-  Abd- Br/ Gen/ Rectal- Not done, not indicated Extrem- cyanosis- none, clubbing, none, atrophy- none, strength- nl Neuro- grossly intact to observation

## 2022-12-04 ENCOUNTER — Encounter: Payer: Self-pay | Admitting: Internal Medicine

## 2022-12-04 ENCOUNTER — Ambulatory Visit: Payer: Medicare HMO | Admitting: Internal Medicine

## 2022-12-04 VITALS — BP 124/68 | HR 77 | Ht 63.0 in | Wt 175.2 lb

## 2022-12-04 DIAGNOSIS — G4733 Obstructive sleep apnea (adult) (pediatric): Secondary | ICD-10-CM | POA: Diagnosis not present

## 2022-12-04 MED ORDER — AMPHETAMINE-DEXTROAMPHETAMINE 10 MG PO TABS
ORAL_TABLET | ORAL | 0 refills | Status: DC
Start: 2022-12-04 — End: 2023-07-09

## 2022-12-04 MED ORDER — ESZOPICLONE 3 MG PO TABS: 3.0000 mg | ORAL_TABLET | Freq: Every day | ORAL | 2 refills | Status: DC

## 2022-12-04 NOTE — Patient Instructions (Addendum)
Order- schedule home sleep test   dx OSA  Please call for results and recommendations about 2 weeks after the test.  Script sent for lunesta - try one at bedtime for sleep as needed  Script sent to try adderall 10 mg, 1 in the morning if needed for alertness. Could repeat in early afternoon if needed and if it doesn't bother sleep

## 2022-12-04 NOTE — Assessment & Plan Note (Addendum)
For updated sleep study. Likely OSA Trial of adderal 10 mg for daytime fatigue Trial of Lunesta to help consolidate sleep- restless sleeper.

## 2023-01-17 ENCOUNTER — Ambulatory Visit (INDEPENDENT_AMBULATORY_CARE_PROVIDER_SITE_OTHER): Payer: Medicare HMO

## 2023-01-17 DIAGNOSIS — G4733 Obstructive sleep apnea (adult) (pediatric): Secondary | ICD-10-CM

## 2023-01-23 ENCOUNTER — Ambulatory Visit: Payer: Medicare HMO | Admitting: Internal Medicine

## 2023-01-23 ENCOUNTER — Encounter: Payer: Self-pay | Admitting: Internal Medicine

## 2023-01-23 VITALS — BP 124/80 | HR 77 | Ht 63.0 in | Wt 175.0 lb

## 2023-01-23 DIAGNOSIS — E890 Postprocedural hypothyroidism: Secondary | ICD-10-CM

## 2023-01-23 DIAGNOSIS — E559 Vitamin D deficiency, unspecified: Secondary | ICD-10-CM | POA: Diagnosis not present

## 2023-01-23 DIAGNOSIS — C73 Malignant neoplasm of thyroid gland: Secondary | ICD-10-CM | POA: Diagnosis not present

## 2023-01-23 LAB — VITAMIN D 25 HYDROXY (VIT D DEFICIENCY, FRACTURES): VITD: 26.06 ng/mL — ABNORMAL LOW (ref 30.00–100.00)

## 2023-01-23 LAB — TSH: TSH: 0.74 u[IU]/mL (ref 0.35–5.50)

## 2023-01-23 NOTE — Progress Notes (Signed)
Patient ID: Teresa Molina, female   DOB: 04-20-1946, 77 y.o.   MRN: [id number]  HPI  Teresa Molina is a 77 y.o.-year-old female, initially referred by Dr. Vikki Ports for f/u for papillary thyroid cancer, postsurgical hypothyroidism, hypocalcemia, vitamin D insufficiency. Last visit 6 months ago. PCP: Dr. Renne Crigler.  Interim history: She previously described fatigue, feeling jittery, night sweats, and not able to sleep.  The symptoms resolved now, after decreasing her Synthroid dose. She has fatigue and feels that she is gaining weight.  Per our scale, she gained 2 pounds since last visit.  Reviewed her thyroid cancer cancer history: Patient has been found to have an incidental thyroid nodule during a recent carotid ultrasound, on 03/10/2015. The nodule was seen in the left lobe and measured 0.9 x 0.8 x 0.9 centimeters.  Thyroid U/S (05/23/2015): Left lower pole solid hypoechoic nodule measures 10 x 8 x 8 mm, nonspecific. There appears to be minor associated microcalcification.    L nodule was small, but hypoechoic and with possible small calcifications >> I suggested FNA.  Adequacy Reason Satisfactory For Evaluation. Diagnosis THYROID, LEFT LOBE INFERIOR, FINE NEEDLE ASPIRATION (SPECIMEN 1 OF 1, COLLECTED ON 06/07/2015): POSITIVE FOR PAPILLARY THYROID CARCINOMA ([place] CATEGORY VI). Zandra Abts MD Pathologist, Electronic Signature (Case signed 06/08/2015) Specimen Clinical Information Left lower pole solid hypoechoic nodule measures 10 x 8 x 8 mm, nonspecific, There appears to be minor associated microcalcification Source Thyroid, Fine Needle Aspiration, Left Lobe Inferior, (Specimen 1 of 1, collected on 06/07/15 )  She had total thyroidectomy by Dr. Gerrit Friends on 07/21/2015. Final pathology showed: Diagnosis Thyroid, thyroidectomy, total - PAPILLARY CARCINOMA, CLASSIC VARIANT, SPANNING 1.1 CM. - EXTRATHYROIDAL EXTENSION PRESENT. - RESECTION MARGINS ARE NEGATIVE. - NO  LYMPHOVASCULAR INVASION. - PARATHYROID TISSUE PRESENT. - SEE ONCOLOGY TABLE. Microscopic Comment THYROID Specimen: Total thyroid. Procedure (including lymph node sampling if applicable): Total thyroidectomy. Specimen Integrity (intact/fragmented): Intact. Tumor focality: Unifocal. Dominant tumor: Maximum tumor size (cm): 1.1 cm. Tumor laterality: Left. Histologic type (including subtype and/or unique features as applicable): Papillary carcinoma, classic variant. Tumor capsule: Partial. Extrathyroidal extension: Present. Capsular invasion with degree of invasion if present: N/A. Margins: Negative. Lymphatic or vascular invasion: Not identified. Lymph nodes: # examined 0; # positive; 0 TNM code: pT3, pNX Non-neoplastic thyroid: Nodular hyperplasia, benign parathyroid tissue.  10/19/2015: RAI tx 123 mCi 10/28/2015: post-tx WBS: No metastasis 10/25/2016: Neck U/S:  no residual thyroid tissue or adenopathy 08/03/2021: Neck U/S: No recurrences or residual malignancy  Thyroglobulin levels and ATA antibodies are undetectable: Lab Results  Component Value Date   THYROGLB 0.1 (L) 08/08/2022   THYROGLB <0.1 (L) 07/28/2021   THYROGLB <0.1 (L) 07/27/2020   THYROGLB <0.1 (L) 07/22/2019   THYROGLB <0.1 (L) 07/23/2018   THYROGLB <0.1 (L) 10/25/2017   THYROGLB 0.1 (L) 06/07/2017   THYROGLB <0.1 (L) 10/25/2016   THYROGLB 0.3 (L) 11/07/2015   THGAB <1 08/08/2022   THGAB <1 07/28/2021   THGAB <1 07/27/2020   THGAB <1 07/22/2019   THGAB <1 07/23/2018   THGAB <1 10/25/2017   THGAB <1 06/07/2017   THGAB <1 10/25/2016   THGAB <1 11/07/2015   Postsurgical hypothyroidism: Pt is on Synthroid d.a.w. 75 mg daily, dose decreased at last visit: - in am - fasting - at least 30 min from b'fast - no Fe, MVI, PPIs - + calcium with dinner - not on Biotin  Reviewed her TFTs: 12/06/2022:  TSH 0.506 Lab Results  Component Value Date   TSH 0.38 10/05/2022  TSH 0.31 (L) 08/08/2022   TSH 1.53  03/28/2022   TSH 0.66 02/07/2022   TSH 0.81 10/03/2021   TSH 3.75 07/28/2021   TSH 2.07 01/27/2021   TSH 0.67 07/27/2020   TSH 1.15 01/27/2020   TSH 0.73 09/11/2019   FREET4 1.41 10/05/2022   FREET4 1.50 08/08/2022   FREET4 1.19 03/28/2022   FREET4 1.19 02/07/2022   FREET4 1.31 10/03/2021   FREET4 0.98 07/28/2021   FREET4 0.97 01/27/2021   FREET4 1.11 07/27/2020   FREET4 1.13 01/27/2020   FREET4 1.11 09/11/2019  04/05/2017: TSH 7.87 01/02/2016: TSH 0.22. 03/07/2015: TSH 3.22, fT4 0.99   Postsurgical hypocalcemia:  She had a Calcium of 7.4 postop >> started calcium carbonate but she came off afterwards.  We restarted this 07/2018: 500 mg with dinner.  At last visit she was not taking this consistently, only 1-2 times a week.  I advised her to start taking this daily.  Reviewed calcium levels: 09/06/2022: Calcium 8.6 (8.6-10.4) 03/15/2022: Calcium 8.7 (8.5-10.1), GFR 55 02/21/2022: Calcium 9.0, GFR 41 05/03/2021: Ionized calcium 4.92 (4.6-5.3) Lab Results  Component Value Date   CALCIUM 8.5 (L) 04/10/2021   CALCIUM 8.8 11/07/2015   CALCIUM 7.4 (L) 07/22/2015  11/03/2020: Ionized calcium 4.84 (4.6-5.3), calcium 8.9 (8.6-10.4), PTH 25.5 01/27/2020: Ionized calcium 4.69 07/22/2019: Ionized calcium 4.27 01/21/2019: Ionized calcium 4.75  07/23/2018: Ionized calcium 4.66 (4.8-5.6) 04/08/2017: Calcium 8.3  Magnesium level was previously low, then normal: Lab Results  Component Value Date   MG 1.6 07/27/2020  05/05/2020: Magnesium 1.7 (1.8-2.4)  Vitamin D insufficiency:  Reviewed her vitamin D levels: Lab Results  Component Value Date   VD25OH 32.17 08/08/2022   VD25OH 50.22 10/03/2021   VD25OH 29.35 (L) 07/28/2021   VD25OH 22.13 (L) 01/27/2021   VD25OH 26.2 (L) 07/27/2020   VD25OH 26.1 (L) 01/27/2020   VD25OH 33.36 07/22/2019   VD25OH 32.69 07/23/2018  05/05/2020 vitamin D 28  At last visit she was on 2000 units vitamin D daily.  We increased the dose to 4000 units  daily.  She was previously missing doses. Now taking it consistently.  Pt denies: - feeling nodules in neck - hoarseness - dysphagia - choking  No FH of thyroid ds. No FH of thyroid cancer. No h/o radiation tx to head or neck. No herbal supplements. No Biotin use. No recent steroids use.   She also has HTN, HL, GERD, sleep apnea.  She also has diabetes, managed by PCP.  HbA1c available for review: 12/06/2022: 6.6%. Prev.: 6.1%, decreased from 7.2%.  Started Metformin >> could not tolerate it. She had yeast inf's with [place]. Previously on Ozempic. Had nausea with it >> improved after backing off the dose >> Trulicity now >> tolerated well No results found for: "HGBA1C"  ROS: + see HPI  I reviewed pt's medications, allergies, PMH, social hx, family hx, and changes were documented in the history of present illness. Otherwise, unchanged from my initial visit note.  Past Medical History:  Diagnosis Date   Allergic conjunctivitis    ALLERGIC RHINITIS    Breast cyst    left breast - ultrasound benign 2010   Cancer [facility])    Thyroid   COVID-19    Esophageal reflux    Food allergy    angioedema > strawberries   GERD (gastroesophageal reflux disease)    Hypertension    Insomnia    OSA on CPAP    NPSG 08-03-09: AHI 17.6; CPAP 12/ AHI 0; PLMA- mild does not wear cpap  PONV (postoperative nausea and vomiting)    Past Surgical History:  Procedure Laterality Date   BREAST SURGERY  2013   Rt breast mass excision   CATARACT EXTRACTION, BILATERAL     CHOLECYSTECTOMY  1991   DILATION AND CURETTAGE OF UTERUS     ROTATOR CUFF REPAIR  2006   squamous cell excised     THYROIDECTOMY N/A 07/21/2015   Procedure: TOTAL THYROIDECTOMY;  Surgeon: Darnell Level, MD;  Location: [facility];  Service: General;  Laterality: N/A;   TONSILLECTOMY  1952 - approximate   TOTAL ABDOMINAL HYSTERECTOMY  1978   Social History   Social History   Marital Status: Divorced    Spouse Name: N/A   Number of  Children: 2   Occupational History   Retired from [employer]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     benzonatate (TESSALON) 200 MG capsule Take 1 capsule (200 mg total) by mouth 3 (three) times daily as needed for cough. (Patient not taking: Reported on 12/04/2022) 30 capsule 1   calcium carbonate (OS-CAL) 600 MG tablet Take 600 mg by mouth daily.     Cholecalciferol (VITAMIN D3) 3000 units TABS Take 1 tablet by mouth daily.     Eszopiclone 3 MG TABS Take 1 tablet (3 mg total) by mouth at bedtime. Take immediately before bedtime 30 tablet 2   Fluticasone-Umeclidin-Vilant (TRELEGY ELLIPTA) 100-62.5-25 MCG/ACT AEPB Inhale 1 puff into the lungs daily. 2 each 0   ibuprofen (ADVIL,MOTRIN) 200 MG tablet Take 200 mg by mouth as needed for moderate pain. Reported on 05/11/2015 (Patient not taking: Reported on 12/04/2022)     labetalol (NORMODYNE) 200 MG tablet Take 1 tablet (200 mg total) by mouth 2 (two) times daily. 60 tablet 0   OneTouch Delica Lancets 33G MISC USE AS DIRECTED TO CHECK BLOOD GLUCOSE DAILY E11.9     ONETOUCH VERIO test strip USE AS DIRECTED TO CHECK BLOOD GLUCOSE DAILY E11.9     SYNTHROID 75 MCG tablet Take 1 tablet (75 mcg total) by mouth daily before breakfast. 45 tablet 3   TRULICITY 0.75 MG/0.5ML SOPN Inject into the skin.     No current facility-administered medications on file prior to visit.   Allergies  Allergen Reactions   Amlodipine Swelling   Diovan  [Valsartan] Swelling   Lipitor [Atorvastatin Calcium] Other (See Comments)    Caused muscle paralysis in legs   Codeine Nausea And Vomiting   Compazine [Prochlorperazine Edisylate] Other (See Comments)    Facial  muscle spasms    Metformin Hcl Er     Other reaction(s): diarrhea, heartburn   Reglan [Metoclopramide] Other (See Comments)    Facial muscle spasms   Rosuvastatin Other (See Comments)    Severe joint pain. Other reaction(s): myalgia   Zoloft [Sertraline Hcl] Other (See Comments)    tremors   Benadryl [Diphenhydramine Hcl] Palpitations   Family History  Problem Relation  Age of Onset   Other Father        brain tumor   COPD Mother    PE: BP 124/80   Pulse 77   Ht 5\' 3"  (1.6 m)   Wt 175 lb 0.3 oz (79.4 kg)   SpO2 97%   BMI 31.00 kg/m   Wt Readings from Last 3 Encounters:  01/23/23 175 lb 0.3 oz (79.4 kg)  12/04/22 175 lb 3.2 oz (79.5 kg)  10/02/22 175 lb 3.2 oz (79.5 kg)   Constitutional: Slightly overweight, in NAD Eyes:  EOMI, no exophthalmos ENT: no neck masses, no cervical lymphadenopathy Cardiovascular: RRR, No MRG Respiratory: CTA B Musculoskeletal: no deformities Skin:no rashes Neurological: no tremor with outstretched hands  ASSESSMENT: 1. Papillary thyroid cancer  2. Postsurgical hypothyroidism  3.  Postsurgical hypocalcemia  4.  Vitamin D insufficiency  5.  Low magnesium  6. DM2, non-insulin-dependent, without long-term complications  PLAN: 1. PTC -Patient with small papillary thyroid cancer, 1.1 cm, unilateral.  There was no lymphovascular invasion, however, there was extrathyroidal extension.  Because of this, she may be considered at intermediate risk, rather than low risk for recurrence.  She had RAI treatment with Thyrogen.  The post treatment whole-body scan did not show any abnormal uptake.  A thyroid ultrasound from 10/2016 showed no recurrence.  Latest thyroid ultrasound report is from 07/2021: No recurrences or residual  malignancy. -We are following her by thyroglobulin and antithyroglobulin antibodies.  They were previously undetectable but at last visit thyroglobulin was low, barely detectable, at 0.1.  Will recheck this today. -No neck mass palpated -She prefers to come back on a 57-month basis, rather than once a year  2. Postsurgical hypothyroidism - latest thyroid labs reviewed with pt. >> normal in 11/2022. At 0.506 - she continues on LT4 88 alternating with 75 mcg every other day, dose decreased at last visit from 88 mcg daily as the TSH was close to the lower limit of the target range and she had increased fatigue and feeling jittery. - pt feels better on this dose, but lately she started to experience hair loss and again hot flashes - we discussed about taking the thyroid hormone every day, with water, >30 minutes before breakfast, separated by >4 hours from acid reflux medications, calcium, iron, multivitamins. Pt. is taking it correctly. - will check a TSH today, to be able to interpret her thyroglobulin level. - If labs are abnormal, she will need to return for repeat TFTs in 1.5 months  3.  Postsurgical hypocalcemia -Resolved -No perioral numbness or muscle cramps.  In the past she described occasional muscle cramps, unclear if related to the calcium level.   -Calcium and PTH levels were normal: 11/03/2020: Ionized calcium 4.84 (4.6-5.3), calcium 8.9 (8.6-10.4), PTH 25.5 -in 02/2022, calcium was normal, at 9.0, and on 03/15/2022, calcium was still normal, at 8.7 (8.5-10.1) -In 08/2022, calcium was normal, at 8.6 -Will continue calcium 500 mg with dinner + an extra tablet of calcium if she starts having tingling or muscle cramps -Will continue to keep an eye on this  4.  Vitamin D insufficiency -Vitamin D level was normal in 07/2022, at 32.17 -I advised her to continue vitamin D 4000 units daily -Will recheck her level today  5.  DM2 -Managed by PCP -HbA1c was 7.2% before she was started on  metformin.  However, she could not tolerate this.  She tried [place] but developed yeast infections.  -She was tried on Ozempic but she had nausea  with this so she was switched to Trulicity, which she currently tolerates well -Latest HbA1c was 6.6% recently  Component     Latest Ref Rng 01/23/2023  TSH     0.35 - 5.50 uIU/mL 0.74   VITD     30.00 - 100.00 ng/mL 26.06 (L)   Thyroglobulin     ng/mL <0.1 (L)   Comment --   Thyroglobulin Ab     < or = 1 IU/mL <1   TSH, thyroglobulin, and thyroglobulin antibodies are excellent.  However, vitamin D level is slightly low.  I will check with her if she is taking a supplement daily, but if she is, she may need a higher dose, 5000 units daily.  Carlus Pavlov, MD PhD [facility]

## 2023-01-23 NOTE — Patient Instructions (Signed)
Please stop at the lab.  Please continue Synthroid 75 mcg every day.  Take the thyroid hormone every day, with water, at least 30 minutes before breakfast, separated by at least 4 hours from: - acid reflux medications - calcium - iron - multivitamins  Continue calcium 500 mg with dinner. You may take an extra calcium tablet as needed.  Continue vitamin D 4000 units daily.  Please come back for a follow-up appointment in 6 months.

## 2023-01-25 LAB — THYROGLOBULIN LEVEL: Thyroglobulin: <0.1 ng/mL — ABNORMAL LOW

## 2023-01-25 LAB — THYROGLOBULIN ANTIBODY: Thyroglobulin Ab: <1 [IU]/mL (ref <=1)

## 2023-01-28 ENCOUNTER — Encounter: Payer: Self-pay | Admitting: Internal Medicine

## 2023-03-03 NOTE — Progress Notes (Signed)
HPI Female never smoker followed for allergic Rhinitis/conjunctivitis, Insomnia, minimal OSA/treated with weight loss-failed CPAP and Provent nasal valves, complicated by history of migraine, hypertension, GERD, Thyroid Cancer/ sgy/ RAI/ Hypothyroid, , Covid infection 01/2019, Carotid Artery Disease, R Trigeminal Neuralgia, DM2, Chronic insomnia, unsuccessfull with multiple treatments. Mild OSA and intolerant of CPAP, unwilling to try others. Didn't follow through with CBT in past PFT 07/23/19- WNL- FEV1/FVC 0.77, TLC 95%, DLCO 99%. Lab 09/24/22- IgE 27, Eos 0.1 ------------------------------------------------------------------------------------------------   12/04/22- 77 year old female never smoker followed for allergic Rhinitis/conjunctivitis, Insomnia, minimal OSA/treated with weight loss(-failed CPAP and Provent nasal valves), Bronchitis,  complicated by history of Migraine, HTN, GERD, Thyroid Cancer/ sgy/ RAI/ Hypothyroid, , Covid infection 01/2019, Jan2023/ molnupiravir,  Carotid Artery Disease?, R Trigeminal Neuralgia, DM2, Kidney Stone,  Chronic insomnia, unsuccessfull with multiple treatments.  Didn't follow through with CBT in past -Trelegy 100,  Body weight today- Lab 09/24/22- IgE 27, Eos 0.1 -----Patient states she feels tired all the time, she also states she is not sleeping well-she is only getting about 3-5hrs of sleep at night  She is concerned thyroid management isn't right- c/o daytime tiredness. She will ask her endocrinologist for early recheck. "Tears up bed". Discussed past hx OSA, never treated She agrees to update HST. We discussed new trial of sleep med- Lunesta. Discussed trial of Adderall 1/2 or 1 tab of 10 mg .  03/05/23- 77 year old female never smoker followed for allergic Rhinitis/conjunctivitis, Insomnia, hx minimal OSA/treated with weight loss(-failed CPAP and Provent nasal valves), Suspected Restless Legs,  Bronchitis,  complicated by history of Migraine, HTN, GERD,  Thyroid Cancer/ sgy/ RAI/ Hypothyroid, , Covid infection 01/2019, Jan2023/ molnupiravir,  Carotid Artery Disease?, R Trigeminal Neuralgia, DM2, Kidney Stone,  Chronic insomnia, unsuccessfull with multiple treatments.  Didn't follow through with CBT in past -Trelegy 100, Adderall 10 bid,  HST 12/25/22- WNL- AHI 4.7/hr, nl O2, body weight 173 lbs We will have trial of paragabalin or requip for "tears up the bed"- likely describing restless legs, not detectable with home sleep test. Asthma/ bronchitis currently minimal.    Review of Systems-see HPI + = positive Constitutional:     +weight loss-diet, night sweats, fevers, chills, fatigue, lassitude.  + insomnia HEENT:   +headaches, difficulty swallowing, tooth/dental problems, +sore throat,       No- sneezing, itching, no-ears itch,+nasal congestion, +post nasal drip,  CV:  No-   chest pain, orthopnea, PND, swelling in lower extremities, anasarca, dizziness, palpitations Resp: +shortness of breath with exertion or at rest.           productive cough, non-productive cough,  No-  coughing up of blood.              No-   change in color of mucus.  No- wheezing.   Skin: No-   rash or lesions. GI:  No-   heartburn, indigestion, abdominal pain, nausea, vomiting,  GU: MS:  No-   joint pain or swelling.   Neuro- :  Psych:  No- change in mood or affect. Some- depression or anxiety.  No memory loss.   Physical Exam  General- Alert, Oriented, Affect-appropriate, very pleasant,  Skin- rash-none, lesions- none, excoriation- none Lymphadenopathy- none Head- atraumatic            Eyes- Gross vision intact, PERRLA, conjunctivae clear secretions            Ears- Canals-, TMs ok,             Nose- clear , No-  sores,  no -Septal dev, mucus, polyps, erosion, perforation.             Throat- Mallampati III , mucosa clear-not red , drainage- none, tonsils- atrophic     Neck- flexible , trachea midline, no stridor , thyroid + incision scar barely visible,  carotid- +I don't hear bruit today Chest - symmetrical excursion , unlabored           Heart/CV- RRR , no murmur , no gallop  , no rub, nl s1 s2                           - JVD- none , edema- none, stasis changes- none, varices- none           Lung- clear, wheeze- none, cough- none, dullness-none, rub- none           Chest wall-  Abd- Br/ Gen/ Rectal- Not done, not indicated Extrem- cyanosis- none, clubbing, none, atrophy- none, strength- nl Neuro- grossly intact to observation

## 2023-03-05 ENCOUNTER — Ambulatory Visit: Payer: Medicare HMO | Admitting: Internal Medicine

## 2023-03-05 ENCOUNTER — Encounter: Payer: Self-pay | Admitting: Internal Medicine

## 2023-03-05 VITALS — BP 123/77 | HR 72 | Ht 63.0 in | Wt 175.4 lb

## 2023-03-05 DIAGNOSIS — G4733 Obstructive sleep apnea (adult) (pediatric): Secondary | ICD-10-CM | POA: Diagnosis not present

## 2023-03-05 DIAGNOSIS — G2581 Restless legs syndrome: Secondary | ICD-10-CM

## 2023-03-05 DIAGNOSIS — J4520 Mild intermittent asthma, uncomplicated: Secondary | ICD-10-CM

## 2023-03-05 MED ORDER — PREGABALIN 25 MG PO CAPS: ORAL_CAPSULE | ORAL | 2 refills | Status: DC

## 2023-03-05 NOTE — Patient Instructions (Signed)
Pregabalin/ Lyrica sent to try for limb jerks in sleep

## 2023-03-08 ENCOUNTER — Other Ambulatory Visit (HOSPITAL_COMMUNITY): Payer: Self-pay

## 2023-03-08 ENCOUNTER — Telehealth: Payer: Self-pay

## 2023-03-08 NOTE — Telephone Encounter (Signed)
*  Pulm  Pharmacy Patient Advocate Encounter   Received notification from CoverMyMeds that prior authorization for Pregabalin 25MG  capsules  is required/requested.   Insurance verification completed.   The patient is insured through [facility] .   Per test claim: PA required; PA submitted to [facility] via CoverMyMeds Key/confirmation #/EOC [id number] Status is pending

## 2023-03-08 NOTE — Telephone Encounter (Signed)
Pharmacy Patient Advocate Encounter  Received notification from [facility] that Prior Authorization for Pregabalin 25MG  capsules  has been DENIED.  See denial reason below. No denial letter attached in CMM. Will attache denial letter to Media tab once received.   PA #/Case ID/Reference #: Your plan does not allow coverage of this medication based on your prescriber answering No to the following  question(s): Is the requested drug being prescribed as adjunctive therapy for treatment of partial onset seizures (focalonset seizures)? Is the requested drug being prescribed for the management of fibromyalgia or the management of neuropathic pain associated with spinal cord injury? Is the requested drug being prescribed for any of the following: A) Management of postherpetic neuralgia, B)  Management of neuropathic pain associated with diabetic peripheral neuropathy, C) Cancer-related neuropathic pain, D) Cancer treatment-related neuropathic pain?

## 2023-03-12 NOTE — Telephone Encounter (Signed)
Insurance doesn't want to pay for pregabalin for the leg movement in sleep. Instead, let's try requip .25 mg, # 60, 1 or 2 at bedtime for leg movement.  See if sleep is better with this

## 2023-03-31 ENCOUNTER — Encounter: Payer: Self-pay | Admitting: Internal Medicine

## 2023-03-31 NOTE — Assessment & Plan Note (Signed)
Resolved with weight loss Plan- good sleep habits, normal body weight

## 2023-03-31 NOTE — Assessment & Plan Note (Signed)
Well-controlled, uncomplicated Plan- keep inhalers available

## 2023-04-04 ENCOUNTER — Other Ambulatory Visit: Payer: Self-pay | Admitting: Internal Medicine

## 2023-04-24 ENCOUNTER — Telehealth: Payer: Self-pay | Admitting: Internal Medicine

## 2023-04-24 MED ORDER — BENZONATATE 200 MG PO CAPS: 200.0000 mg | ORAL_CAPSULE | Freq: Three times a day (TID) | ORAL | 1 refills | Status: DC | PRN

## 2023-04-24 MED ORDER — AZITHROMYCIN 250 MG PO TABS: ORAL_TABLET | ORAL | 0 refills | Status: DC

## 2023-04-24 NOTE — Telephone Encounter (Signed)
Several days laryngitis, dry cough, no fever Sending Zpak and benzonatate, hydrate

## 2023-04-30 ENCOUNTER — Ambulatory Visit: Payer: Medicare HMO | Admitting: Internal Medicine

## 2023-04-30 VITALS — BP 130/64 | HR 74 | Temp 98.0°F | Ht 63.0 in | Wt 172.0 lb

## 2023-04-30 DIAGNOSIS — J4520 Mild intermittent asthma, uncomplicated: Secondary | ICD-10-CM | POA: Diagnosis not present

## 2023-04-30 MED ORDER — TRELEGY ELLIPTA 100-62.5-25 MCG/ACT IN AEPB: 1.0000 | INHALATION_SPRAY | Freq: Every day | RESPIRATORY_TRACT | Status: AC

## 2023-04-30 MED ORDER — PREDNISONE 10 MG PO TABS: ORAL_TABLET | ORAL | 0 refills | Status: DC

## 2023-04-30 MED ORDER — METHYLPREDNISOLONE ACETATE 80 MG/ML IJ SUSP
80.0000 mg | Freq: Once | INTRAMUSCULAR | Status: AC
  Administered 2023-04-30: 80 mg via INTRAMUSCULAR

## 2023-04-30 MED ORDER — PROMETHAZINE-DM 6.25-15 MG/5ML PO SYRP: 5.0000 mL | ORAL_SOLUTION | Freq: Four times a day (QID) | ORAL | 0 refills | Status: DC | PRN

## 2023-04-30 NOTE — Patient Instructions (Signed)
Order- depo 80    dx asthmatic bronchitis exacerbation  Script sent for prednisone taper- start in the morning  Script sent for phenergan DM cough syrup  Sample Trelegy 100 inhale 1 puff then rinse mouth once daily

## 2023-04-30 NOTE — Progress Notes (Unsigned)
HPI Female never smoker followed for allergic Rhinitis/conjunctivitis, Insomnia, minimal OSA/treated with weight loss-failed CPAP and Provent nasal valves, complicated by history of migraine, hypertension, GERD, Thyroid Cancer/ sgy/ RAI/ Hypothyroid, , Covid infection 01/2019, Carotid Artery Disease, R Trigeminal Neuralgia, DM2, Chronic insomnia, unsuccessfull with multiple treatments. Mild OSA and intolerant of CPAP, unwilling to try others. Didn't follow through with CBT in past PFT 07/23/19- WNL- FEV1/FVC 0.77, TLC 95%, DLCO 99%. Lab 09/24/22- IgE 27, Eos 0.1 ------------------------------------------------------------------------------------------------    03/05/23- 77 year old female never smoker followed for allergic Rhinitis/conjunctivitis, Insomnia, hx minimal OSA/treated with weight loss(-failed CPAP and Provent nasal valves), Suspected Restless Legs,  Bronchitis,  complicated by history of Migraine, HTN, GERD, Thyroid Cancer/ sgy/ RAI/ Hypothyroid, , Covid infection 01/2019, Jan2023/ molnupiravir,  Carotid Artery Disease?, R Trigeminal Neuralgia, DM2, Kidney Stone,  Chronic insomnia, unsuccessfull with multiple treatments.  Didn't follow through with CBT in past -Trelegy 100, Adderall 10 bid,  HST 12/25/22- WNL- AHI 4.7/hr, nl O2, body weight 173 lbs We will have trial of paragabalin or requip for "tears up the bed"- likely describing restless legs, not detectable with home sleep test. Asthma/ bronchitis currently minimal.  04/30/23- 77 year old female never smoker followed for allergic Rhinitis/conjunctivitis, Insomnia, hx minimal OSA/treated with weight loss(-failed CPAP and Provent nasal valves), Suspected Restless Legs,  Bronchitis,  complicated by history of Migraine, HTN, GERD, Thyroid Cancer/ sgy/ RAI/ Hypothyroid, , Covid infection 01/2019, Jan2023/ molnupiravir,  Carotid Artery Disease?, R Trigeminal Neuralgia, DM2, Kidney Stone,  Chronic insomnia, unsuccessfull with multiple  treatments.  Didn't follow through with CBT in past -Trelegy 100, Adderall 10 bid, pregabalin 25 1-2 tabs HS Discussed the use of AI scribe software for clinical note transcription with the patient, who gave verbal consent to proceed.  History of Present Illness   The patient, with a history of thyroid cancer, presents with a week-long history of coughing and wheezing. The symptoms started with laryngitis, which is a common precursor for her respiratory symptoms. The coughing is severe and the wheezing is loud enough to disrupt her sleep. She has tried doubling up on her 'pearl' cough medication with little relief. She also reports feeling dizzy after severe coughing spells and had a nosebleed the previous night. The patient has a history of adverse reactions to codeine and Reglan, both of which caused vomiting and facial muscle spasms respectively. We discussed relation of phenergan to Reglan.      Review of Systems-see HPI + = positive Constitutional:     +weight loss-diet, night sweats, fevers, chills, fatigue, lassitude.  + insomnia HEENT:   +headaches, difficulty swallowing, tooth/dental problems, +sore throat,       No- sneezing, itching, no-ears itch,+nasal congestion, +post nasal drip,  CV:  No-   chest pain, orthopnea, PND, swelling in lower extremities, anasarca, dizziness, palpitations Resp: +shortness of breath with exertion or at rest.           productive cough, non-productive cough,  No-  coughing up of blood.              No-   change in color of mucus.  No- wheezing.   Skin: No-   rash or lesions. GI:  No-   heartburn, indigestion, abdominal pain, nausea, vomiting,  GU: MS:  No-   joint pain or swelling.   Neuro- :  Psych:  No- change in mood or affect. Some- depression or anxiety.  No memory loss.   Physical Exam  General- Alert, Oriented, Affect-appropriate, very pleasant,  Skin- rash-none,  lesions- none, excoriation- none Lymphadenopathy- none Head- atraumatic             Eyes- Gross vision intact, PERRLA, conjunctivae clear secretions            Ears- Canals-, TMs ok,             Nose- clear , No- sores,  no -Septal dev, mucus, polyps, erosion, perforation.             Throat- Mallampati III , mucosa clear-not red , drainage- none, tonsils- atrophic     Neck- flexible , trachea midline, no stridor , thyroid + incision scar barely visible, carotid- +I don't hear bruit today Chest - symmetrical excursion , unlabored           Heart/CV- RRR , no murmur , no gallop  , no rub, nl s1 s2                           - JVD- none , edema- none, stasis changes- none, varices- none           Lung-  wheeze+, cough+deep barking, dullness-none, rub- none           Chest wall-  Abd- Br/ Gen/ Rectal- Not done, not indicated Extrem- cyanosis- none, clubbing, none, atrophy- none, strength- nl Neuro- grossly intact to observation  Assessment and Plan    Asthmatic Bronchitis Acute onset of wheezing and coughing, likely viral in origin. Noted to have a history of similar episodes. No relief with over-the-counter cough suppressants. -Administer cortisone shot today. -Prescribe prednisone taper to start tomorrow morning. -Prescribe Phenergan DM cough syrup (without codeine due to previous intolerance). -Provide Trelegy inhaler for use once daily, with instructions to rinse mouth after use. -Check-in appointment in February.  General Health Maintenance -Encouraged to maintain hydration and use over-the-counter throat lozenges as needed.

## 2023-05-01 ENCOUNTER — Encounter: Payer: Self-pay | Admitting: Internal Medicine

## 2023-07-07 NOTE — Progress Notes (Unsigned)
 HPI Female never smoker followed for allergic Rhinitis/conjunctivitis, Insomnia, minimal OSA/treated with weight loss-failed CPAP and Provent nasal valves, complicated by history of migraine, hypertension, GERD, Thyroid Cancer/ sgy/ RAI/ Hypothyroid, , Covid infection 01/2019, Carotid Artery Disease, R Trigeminal Neuralgia, DM2, Chronic insomnia, unsuccessfull with multiple treatments. Mild OSA and intolerant of CPAP, unwilling to try others. Didn't follow through with CBT in past PFT 07/23/19- WNL- FEV1/FVC 0.77, TLC 95%, DLCO 99%. Lab 09/24/22- IgE 27, Eos 0.1 HST 12/25/22- WNL- AHI 4.7/hr, nl O2, body weight 173 lbs ------------------------------------------------------------------------------------------------  04/30/23- 78 year old female never smoker followed for allergic Rhinitis/conjunctivitis, Insomnia, hx minimal OSA/treated with weight loss(-failed CPAP and Provent nasal valves), Suspected Restless Legs,  Bronchitis,  complicated by history of Migraine, HTN, GERD, Thyroid Cancer/ sgy/ RAI/ Hypothyroid, , Covid infection 01/2019, Jan2023/ molnupiravir,  Carotid Artery Disease?, R Trigeminal Neuralgia, DM2, Kidney Stone,  Chronic insomnia, unsuccessfull with multiple treatments.  Didn't follow through with CBT in past -Trelegy 100, Adderall 10 bid, pregabalin 25 1-2 tabs HS Discussed the use of AI scribe software for clinical note transcription with the patient, who gave verbal consent to proceed History of Present Illness   The patient, with a history of thyroid cancer, presents with a week-long history of coughing and wheezing. The symptoms started with laryngitis, which is a common precursor for her respiratory symptoms. The coughing is severe and the wheezing is loud enough to disrupt her sleep. She has tried doubling up on her 'pearl' cough medication with little relief. She also reports feeling dizzy after severe coughing spells and had a nosebleed the previous night. The patient has a  history of adverse reactions to codeine and Reglan, both of which caused vomiting and facial muscle spasms respectively. We discussed relation of phenergan to Reglan.   07/09/23- 78 year old female never smoker followed for allergic Rhinitis/conjunctivitis, Insomnia, hx minimal OSA/treated with weight loss(-failed CPAP and Provent nasal valves), Suspected Restless Legs,  Bronchitis,  complicated by history of Migraine, HTN, GERD, Thyroid Cancer/ sgy/ RAI/ Hypothyroid, , Covid infection 01/2019, Jan2023/ molnupiravir,  Carotid Artery Disease?, R Trigeminal Neuralgia, DM2, Kidney Stone,  Chronic insomnia, unsuccessfull with multiple treatments.  Didn't follow through with CBT in past -Trelegy 100, Adderall 10 bid, pregabalin 25 1-2 tabs HS At last visit had acute asthmatic bronchitis>> depo 80, prednisone taper, phenergan DM, Trelegy 100  then 2 month f/u. Discussed the use of AI scribe software for clinical note transcription with the patient, who gave verbal consent to proceed.  History of Present Illness   The patient, with a history of bronchitis and thyroid issues, presents for a follow-up visit. She reports a prolonged recovery from  recent bout of bronchitis, which we addressed at last visit. She expresses concern about recurring bronchitis, which she experiences twice a year. This is within common range for adults. She incidentally mentions a recent dream about being diagnosed with breast cancer, which has caused her some anxiety. She is pending scheduled routine mammogram.  The patient also reports experiencing night sweats and hair loss, which she believes may be related to her thyroid condition. She also expresses concern about potential digestive issues, including a possible ulcer, which she suspects may be related to her use of Trulicity. She reports taking an acid blocker at night for these symptoms. She also mentions having a small hiatal hernia. She will f/u with her endocrinologist.  The  patient is currently not taking Adderall or Lyrica, and reports generally good sleep, aside from unusual dreams. She expresses a dislike  for nighttime and difficulty falling asleep early. This is part of her chronic insomnia, which is actually less of a problem currently.      Review of Systems-see HPI + = positive Constitutional:     +weight loss-diet, night sweats, fevers, chills, fatigue, lassitude.  + insomnia HEENT:   +headaches, difficulty swallowing, tooth/dental problems, +sore throat,       No- sneezing, itching, no-ears itch,+nasal congestion, +post nasal drip,  CV:  No-   chest pain, orthopnea, PND, swelling in lower extremities, anasarca, dizziness, palpitations Resp: +shortness of breath with exertion or at rest.           productive cough, non-productive cough,  No-  coughing up of blood.              No-   change in color of mucus.  No- wheezing.   Skin: No-   rash or lesions. GI:  No-   heartburn, indigestion, abdominal pain, nausea, vomiting,  GU: MS:  No-   joint pain or swelling.   Neuro- :  Psych:  No- change in mood or affect. Some- depression or anxiety.  No memory loss.   Physical Exam  General- Alert, Oriented, Affect-appropriate, very pleasant,  Skin- rash-none, lesions- none, excoriation- none Lymphadenopathy- none Head- atraumatic            Eyes- Gross vision intact, PERRLA, conjunctivae clear secretions            Ears- Canals-, TMs ok,             Nose- clear , No- sores,  no -Septal dev, mucus, polyps, erosion, perforation.             Throat- Mallampati III , mucosa clear-not red , drainage- none, tonsils- atrophic     Neck- flexible , trachea midline, no stridor , thyroid + incision scar barely visible, carotid- +I don't hear bruit today Chest - symmetrical excursion , unlabored           Heart/CV- RRR , no murmur , no gallop  , no rub, nl s1 s2                           - JVD- none , edema- none, stasis changes- none, varices- none           Lung-   wheeze+, cough+deep barking, dullness-none, rub- none           Chest wall-  Abd- Br/ Gen/ Rectal- Not done, not indicated Extrem- cyanosis- none, clubbing, none, atrophy- none, strength- nl Neuro- grossly intact to observation Assessment and Plan    Chronic Insomnia - psychophysiologic pattern, probably with some hormonal component Counseled sleep hygiene   Recurrent Bronchitis Patient reports experiencing bronchitis twice a year, with the most recent episode being severe and prolonged. No current symptoms. -Continue current management plan.  Thyroid Disorder Patient reports night sweats and hair loss, suspecting it may be related to thyroid levels. Currently on Synthroid. -Encourage patient to discuss symptoms with endocrinologist at upcoming appointment.  Possible Digestive Issues Patient reports concerns about Trulicity affecting digestive system and possible ulcer. Currently taking Pepcid at night for hiatal hernia. -Recommend patient discuss these concerns with primary care provider or gastroenterologist.  Follow-up in 3 months.

## 2023-07-09 ENCOUNTER — Ambulatory Visit: Payer: Medicare HMO | Admitting: Internal Medicine

## 2023-07-09 ENCOUNTER — Encounter: Payer: Self-pay | Admitting: Internal Medicine

## 2023-07-09 VITALS — BP 140/70 | HR 76 | Temp 97.7°F | Ht 63.0 in | Wt 172.4 lb

## 2023-07-09 DIAGNOSIS — J4520 Mild intermittent asthma, uncomplicated: Secondary | ICD-10-CM

## 2023-07-09 DIAGNOSIS — J42 Unspecified chronic bronchitis: Secondary | ICD-10-CM

## 2023-07-09 NOTE — Patient Instructions (Signed)
No changes for now.

## 2023-08-06 NOTE — Progress Notes (Unsigned)
 Patient ID: Teresa Molina, female   DOB: [birth year], 78 y.o.   MRN: [id number]  HPI  Teresa Molina is a 78 y.o.-year-old female, initially referred by Dr. Vikki Ports for f/u for papillary thyroid cancer, postsurgical hypothyroidism, hypocalcemia, vitamin D insufficiency. Last visit 6 months ago. PCP: Dr. Renne Crigler.  Interim history: She previously described fatigue, feeling jittery, night sweats, and not able to sleep.  These resolved after decreasing her Synthroid dose. She has fatigue and feels that she is gaining weight.   Reviewed her thyroid cancer cancer history: Patient has been found to have an incidental thyroid nodule during a recent carotid ultrasound, on 03/10/2015. The nodule was seen in the left lobe and measured 0.9 x 0.8 x 0.9 centimeters.  Thyroid U/S (05/23/2015): Left lower pole solid hypoechoic nodule measures 10 x 8 x 8 mm, nonspecific. There appears to be minor associated microcalcification.    L nodule was small, but hypoechoic and with possible small calcifications >> I suggested FNA.  Adequacy Reason Satisfactory For Evaluation. Diagnosis THYROID, LEFT LOBE INFERIOR, FINE NEEDLE ASPIRATION (SPECIMEN 1 OF 1, COLLECTED ON 06/07/2015): POSITIVE FOR PAPILLARY THYROID CARCINOMA ([place] CATEGORY VI). Zandra Abts MD Pathologist, Electronic Signature (Case signed 06/08/2015) Specimen Clinical Information Left lower pole solid hypoechoic nodule measures 10 x 8 x 8 mm, nonspecific, There appears to be minor associated microcalcification Source Thyroid, Fine Needle Aspiration, Left Lobe Inferior, (Specimen 1 of 1, collected on 06/07/15 )  She had total thyroidectomy by Dr. Gerrit Friends on 07/21/2015. Final pathology showed: Diagnosis Thyroid, thyroidectomy, total - PAPILLARY CARCINOMA, CLASSIC VARIANT, SPANNING 1.1 CM. - EXTRATHYROIDAL EXTENSION PRESENT. - RESECTION MARGINS ARE NEGATIVE. - NO LYMPHOVASCULAR INVASION. - PARATHYROID TISSUE PRESENT. - SEE ONCOLOGY  TABLE. Microscopic Comment THYROID Specimen: Total thyroid. Procedure (including lymph node sampling if applicable): Total thyroidectomy. Specimen Integrity (intact/fragmented): Intact. Tumor focality: Unifocal. Dominant tumor: Maximum tumor size (cm): 1.1 cm. Tumor laterality: Left. Histologic type (including subtype and/or unique features as applicable): Papillary carcinoma, classic variant. Tumor capsule: Partial. Extrathyroidal extension: Present. Capsular invasion with degree of invasion if present: N/A. Margins: Negative. Lymphatic or vascular invasion: Not identified. Lymph nodes: # examined 0; # positive; 0 TNM code: pT3, pNX Non-neoplastic thyroid: Nodular hyperplasia, benign parathyroid tissue.  10/19/2015: RAI tx 123 mCi 10/28/2015: post-tx WBS: No metastasis 10/25/2016: Neck U/S:  no residual thyroid tissue or adenopathy 08/03/2021: Neck U/S: No recurrences or residual malignancy  Thyroglobulin levels and ATA antibodies are undetectable: Lab Results  Component Value Date   THYROGLB <0.1 (L) 01/23/2023   THYROGLB 0.1 (L) 08/08/2022   THYROGLB <0.1 (L) 07/28/2021   THYROGLB <0.1 (L) 07/27/2020   THYROGLB <0.1 (L) 07/22/2019   THYROGLB <0.1 (L) 07/23/2018   THYROGLB <0.1 (L) 10/25/2017   THYROGLB 0.1 (L) 06/07/2017   THYROGLB <0.1 (L) 10/25/2016   THYROGLB 0.3 (L) 11/07/2015   THGAB <1 01/23/2023   THGAB <1 08/08/2022   THGAB <1 07/28/2021   THGAB <1 07/27/2020   THGAB <1 07/22/2019   THGAB <1 07/23/2018   THGAB <1 10/25/2017   THGAB <1 06/07/2017   THGAB <1 10/25/2016   THGAB <1 11/07/2015   Postsurgical hypothyroidism: Pt is on Synthroid d.a.w. 88 mcg daily: - in am - fasting - at least 30 min from b'fast - no Fe, MVI, PPIs - + calcium with dinner - not on Biotin  Reviewed her TFTs: Lab Results  Component Value Date   TSH 0.74 01/23/2023   TSH 0.38 10/05/2022   TSH 0.31 (L) 08/08/2022  TSH 1.53 03/28/2022   TSH 0.66 02/07/2022   TSH 0.81  10/03/2021   TSH 3.75 07/28/2021   TSH 2.07 01/27/2021   TSH 0.67 07/27/2020   TSH 1.15 01/27/2020   FREET4 1.41 10/05/2022   FREET4 1.50 08/08/2022   FREET4 1.19 03/28/2022   FREET4 1.19 02/07/2022   FREET4 1.31 10/03/2021   FREET4 0.98 07/28/2021   FREET4 0.97 01/27/2021   FREET4 1.11 07/27/2020   FREET4 1.13 01/27/2020   FREET4 1.11 09/11/2019  12/06/2022:  TSH 0.506 04/05/2017: TSH 7.87 01/02/2016: TSH 0.22. 03/07/2015: TSH 3.22, fT4 0.99   Postsurgical hypocalcemia:  She had a Calcium of 7.4 postop >> started calcium carbonate but she came off afterwards.  We restarted this 07/2018: 500 mg with dinner.  At last visit she was not taking this consistently, only 1-2 times a week.  I advised her to start taking this daily.  Reviewed calcium levels: 09/06/2022: Calcium 8.6 (8.6-10.4) 03/15/2022: Calcium 8.7 (8.5-10.1), GFR 55 02/21/2022: Calcium 9.0, GFR 41 05/03/2021: Ionized calcium 4.92 (4.6-5.3) Lab Results  Component Value Date   CALCIUM 8.5 (L) 04/10/2021   CALCIUM 8.8 11/07/2015   CALCIUM 7.4 (L) 07/22/2015  11/03/2020: Ionized calcium 4.84 (4.6-5.3), calcium 8.9 (8.6-10.4), PTH 25.5 01/27/2020: Ionized calcium 4.69 07/22/2019: Ionized calcium 4.27 01/21/2019: Ionized calcium 4.75  07/23/2018: Ionized calcium 4.66 (4.8-5.6) 04/08/2017: Calcium 8.3  Magnesium level was previously low, then normal: Lab Results  Component Value Date   MG 1.6 07/27/2020  05/05/2020: Magnesium 1.7 (1.8-2.4)  Vitamin D insufficiency:  Reviewed her vitamin D levels: Lab Results  Component Value Date   VD25OH 26.06 (L) 01/23/2023   VD25OH 32.17 08/08/2022   VD25OH 50.22 10/03/2021   VD25OH 29.35 (L) 07/28/2021   VD25OH 22.13 (L) 01/27/2021   VD25OH 26.2 (L) 07/27/2020   VD25OH 26.1 (L) 01/27/2020   VD25OH 33.36 07/22/2019   VD25OH 32.69 07/23/2018  05/05/2020 vitamin D 28  At last visit she was on 2000 units vitamin D daily.  We increased the dose to 4000 units daily.  She was  previously missing doses but was taking it consistently before last visit so I advised her to increase the dose to 5000 units daily.  Pt denies: - feeling nodules in neck - hoarseness - dysphagia - choking  No FH of thyroid ds. No FH of thyroid cancer. No h/o radiation tx to head or neck. No herbal supplements. No Biotin use. No recent steroids use.   She also has HTN, HL, GERD, sleep apnea.  She also has diabetes, managed by PCP.  HbA1c available for review: 12/06/2022: 6.6%. Prev.: 6.1%, decreased from 7.2%.  Started Metformin >> could not tolerate it. She had yeast inf's with [place]. Previously on Ozempic. Had nausea with it >> improved after backing off the dose >> Trulicity now >> tolerated well No results found for: "HGBA1C"  ROS: + see HPI  I reviewed pt's medications, allergies, PMH, social hx, family hx, and changes were documented in the history of present illness. Otherwise, unchanged from my initial visit note.  Past Medical History:  Diagnosis Date   Allergic conjunctivitis    ALLERGIC RHINITIS    Breast cyst    left breast - ultrasound benign 2010   Cancer (HCC)    Thyroid   COVID-19    Esophageal reflux    Food allergy    angioedema > strawberries   GERD (gastroesophageal reflux disease)    Hypertension    Insomnia    OSA on CPAP  NPSG 08-03-09: AHI 17.6; CPAP 12/ AHI 0; PLMA- mild does not wear cpap    PONV (postoperative nausea and vomiting)    Past Surgical History:  Procedure Laterality Date   BREAST SURGERY  2013   Rt breast mass excision   CATARACT EXTRACTION, BILATERAL     CHOLECYSTECTOMY  1991   DILATION AND CURETTAGE OF UTERUS     ROTATOR CUFF REPAIR  2006   squamous cell excised     THYROIDECTOMY N/A 07/21/2015   Procedure: TOTAL THYROIDECTOMY;  Surgeon: Darnell Level, MD;  Location: [facility];  Service: General;  Laterality: N/A;   TONSILLECTOMY  1952 - approximate   TOTAL ABDOMINAL HYSTERECTOMY  1978   Social History   Social History    Marital Status: Divorced    Spouse Name: N/A   Number of Children: 2   Occupational History   Retired from [employer]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     benzonatate (TESSALON) 200 MG capsule Take 1 capsule (200 mg total) by mouth 3 (three) times daily as needed for cough. 30 capsule 1   calcium carbonate (OS-CAL) 600 MG tablet Take 600 mg by mouth daily.     Cholecalciferol (VITAMIN D3) 3000 units TABS Take 1 tablet by mouth daily.     Fluticasone-Umeclidin-Vilant (TRELEGY ELLIPTA) 100-62.5-25 MCG/ACT AEPB Inhale 1 puff into the lungs daily. (Patient taking differently: Inhale 1 puff into the lungs daily as needed.)     labetalol (NORMODYNE) 200 MG tablet Take 1 tablet (200 mg total) by mouth 2 (two) times daily. 60 tablet 0   OneTouch Delica Lancets 33G MISC USE AS DIRECTED TO CHECK BLOOD GLUCOSE DAILY E11.9     ONETOUCH VERIO test strip USE AS DIRECTED TO CHECK BLOOD GLUCOSE DAILY E11.9     SYNTHROID 75 MCG tablet TAKE 1 TABLET BY MOUTH DAILY BEFORE BREAKFAST 45 tablet 3   TRULICITY 0.75 MG/0.5ML SOPN Inject into the skin.     No current facility-administered medications on file prior to visit.   Allergies  Allergen Reactions   Amlodipine Swelling   Diovan [Valsartan] Swelling   Lipitor [Atorvastatin Calcium] Other (See Comments)    Caused muscle paralysis in legs   Codeine Nausea And Vomiting   Compazine [Prochlorperazine Edisylate] Other (See Comments)    Facial  muscle spasms    Metformin Hcl Er     Other reaction(s): diarrhea, heartburn   Reglan [Metoclopramide]  Other (See Comments)    Facial muscle spasms   Rosuvastatin Other (See Comments)    Severe joint pain. Other reaction(s): myalgia   Zoloft [Sertraline Hcl] Other (See Comments)    tremors   Benadryl [Diphenhydramine Hcl] Palpitations   Family History  Problem Relation Age of Onset   Other Father        brain tumor   COPD Mother    PE: There were no vitals taken for this visit.  Wt Readings from Last 3 Encounters:  07/09/23 172 lb 6.4 oz (78.2 kg)  04/30/23  172 lb (78 kg)  03/05/23 175 lb 6.4 oz (79.6 kg)   Constitutional: Slightly overweight, in NAD Eyes:  EOMI, no exophthalmos ENT: no neck masses, no cervical lymphadenopathy Cardiovascular: RRR, No MRG Respiratory: CTA B Musculoskeletal: no deformities Skin:no rashes Neurological: no tremor with outstretched hands  ASSESSMENT: 1. Papillary thyroid cancer  2. Postsurgical hypothyroidism  3.  Postsurgical hypocalcemia  4.  Vitamin D insufficiency  5.  Low magnesium  6. DM2, non-insulin-dependent, without long-term complications  PLAN: 1. PTC -Patient with small papillary thyroid cancer, measuring 1.1 cm in the largest dimension, unilateral.  There was no lymphovascular invasion, however, there was extrathyroidal extension.  Because of this, she may be considered at intermediate risk, rather than low risk for recurrence.  She had RAI treatment with Thyrogen.  The post treatment whole-body scan did not show any abnormal uptake.  A thyroid ultrasound from 10/2016 showed no recurrence.  Latest thyroid ultrasound is from 07/2021: No recurrent cyst or residual malignancy. -We are following her by thyroglobulin and ATA antibodies.  Her thyroglobulin is either undetectable or barely detectable, and 0.1 and antithyroglobulin antibodies are undetectable -No neck mass is palpated -She prefers to come back on a 3-month basis, rather than once a year  2. Postsurgical hypothyroidism - latest thyroid labs reviewed with pt. >>  normal: Lab Results  Component Value Date   TSH 0.74 01/23/2023  - she continues on LT4 88 mcg daily - pt feels good on this dose. - we discussed about taking the thyroid hormone every day, with water, >30 minutes before breakfast, separated by >4 hours from acid reflux medications, calcium, iron, multivitamins. Pt. is taking it correctly. - will check thyroid tests today: TSH and fT4 - If labs are abnormal, she will need to return for repeat TFTs in 1.5 months  3.  Postsurgical hypocalcemia -Resolved -No perioral numbness or muscle cramps.  In the past she described occasional muscle cramps, unclear if related to the calcium level.   -Calcium and PTH levels were normal afterwards -Latest calcium was normal, 8.6 in 08/2022 -She is taking 500 mg of calcium with dinner + an extra tablet of calcium if she starts having tingling or muscle cramps -Will continue to keep an eye on this  4.  Vitamin D insufficiency -On vitamin D supplement, increased after last visit from 4000 to 5000 units daily -At last visit, vitamin D was slightly low, at 26.06  5.  DM2 -Managed by her primary care doctor -HbA1c was 7.2% before she was started on metformin.  However, she could not tolerate this.  She tried [place] but developed yeast infections.  -She was reviewed history on Ozempic but had nausea with it so she switched to Trulicity.  She tolerates this well -Latest HbA1c was 6.6% before last visit   Carlus Pavlov, MD PhD [facility] Endocrinology

## 2023-08-07 ENCOUNTER — Encounter: Payer: Self-pay | Admitting: Internal Medicine

## 2023-08-07 ENCOUNTER — Ambulatory Visit (INDEPENDENT_AMBULATORY_CARE_PROVIDER_SITE_OTHER): Payer: Medicare HMO | Admitting: Internal Medicine

## 2023-08-07 VITALS — BP 120/70 | HR 80 | Ht 63.0 in | Wt 170.4 lb

## 2023-08-07 DIAGNOSIS — C73 Malignant neoplasm of thyroid gland: Secondary | ICD-10-CM

## 2023-08-07 DIAGNOSIS — E890 Postprocedural hypothyroidism: Secondary | ICD-10-CM

## 2023-08-07 DIAGNOSIS — E559 Vitamin D deficiency, unspecified: Secondary | ICD-10-CM | POA: Diagnosis not present

## 2023-08-07 NOTE — Patient Instructions (Signed)
 Please continue Synthroid 75 mcg every day.  Take the thyroid hormone every day, with water, at least 30 minutes before breakfast, separated by at least 4 hours from: - acid reflux medications - calcium - iron - multivitamins  Continue: - Calcium 500 mg with dinner. You may take an extra calcium tablet as needed. - Vitamin D 4000 units daily.  Please come back for a follow-up appointment in 6 months.

## 2023-08-08 ENCOUNTER — Encounter: Payer: Self-pay | Admitting: Internal Medicine

## 2023-08-08 LAB — CALCIUM, IONIZED: Calcium, Ion: 5.1 mg/dL (ref 4.7–5.5)

## 2023-08-08 LAB — T4, FREE: Free T4: 1.8 ng/dL (ref 0.8–1.8)

## 2023-08-08 LAB — VITAMIN D 25 HYDROXY (VIT D DEFICIENCY, FRACTURES): Vit D, 25-Hydroxy: 40 ng/mL (ref 30–100)

## 2023-08-08 LAB — TSH: TSH: 0.24 m[IU]/L — ABNORMAL LOW (ref 0.40–4.50)

## 2023-08-08 MED ORDER — SYNTHROID 75 MCG PO TABS: ORAL_TABLET | ORAL | 3 refills | Status: DC

## 2023-08-08 NOTE — Addendum Note (Signed)
 Addended by: Carlus Pavlov on: 08/08/2023 11:38 AM   Modules accepted: Orders

## 2023-09-18 ENCOUNTER — Other Ambulatory Visit

## 2023-09-19 LAB — T4, FREE: Free T4: 1.4 ng/dL (ref 0.8–1.8)

## 2023-09-19 LAB — TSH: TSH: 1.36 m[IU]/L (ref 0.40–4.50)

## 2023-10-06 NOTE — Progress Notes (Signed)
 HPI Female never smoker followed for allergic Rhinitis/conjunctivitis, Insomnia, minimal OSA/treated with weight loss-failed CPAP and Provent nasal valves, complicated by history of migraine, hypertension, GERD, Thyroid  Cancer/ sgy/ RAI/ Hypothyroid, , Covid infection 01/2019, Carotid Artery Disease, R Trigeminal Neuralgia, DM2, Chronic insomnia, unsuccessfull with multiple treatments. Mild OSA and intolerant of CPAP, unwilling to try others. Didn't follow through with CBT in past PFT 07/23/19- WNL- FEV1/FVC 0.77, TLC 95%, DLCO 99%. Lab 09/24/22- IgE 27, Eos 0.1 HST 12/25/22- WNL- AHI 4.7/hr, nl O2, body weight 173 lbs ------------------------------------------------------------------------------------------------  04/30/23- 78 year old female never smoker followed for allergic Rhinitis/conjunctivitis, Insomnia, hx minimal OSA/treated with weight loss(-failed CPAP and Provent nasal valves), Suspected Restless Legs,  Bronchitis,  complicated by history of Migraine, HTN, GERD, Thyroid  Cancer/ sgy/ RAI/ Hypothyroid, , Covid infection 01/2019, Jan2023/ molnupiravir ,  Carotid Artery Disease?, R Trigeminal Neuralgia, DM2, Kidney Stone,  Chronic insomnia, unsuccessfull with multiple treatments.  Didn't follow through with CBT in past -Trelegy 100, Adderall 10 bid, pregabalin  25 1-2 tabs HS Discussed the use of AI scribe software for clinical note transcription with the patient, who gave verbal consent to proceed History of Present Illness   The patient, with a history of thyroid  cancer, presents with a week-long history of coughing and wheezing. The symptoms started with laryngitis, which is a common precursor for her respiratory symptoms. The coughing is severe and the wheezing is loud enough to disrupt her sleep. She has tried doubling up on her 'pearl' cough medication with little relief. She also reports feeling dizzy after severe coughing spells and had a nosebleed the previous night. The patient has a  history of adverse reactions to codeine and Reglan, both of which caused vomiting and facial muscle spasms respectively. We discussed relation of phenergan  to Reglan.   07/09/23- 78 year old female never smoker followed for allergic Rhinitis/conjunctivitis, Insomnia, hx minimal OSA/treated with weight loss(-failed CPAP and Provent nasal valves), Suspected Restless Legs,  Bronchitis,  complicated by history of Migraine, HTN, GERD, Thyroid  Cancer/ sgy/ RAI/ Hypothyroid, , Covid infection 01/2019, Jan2023/ molnupiravir ,  Carotid Artery Disease?, R Trigeminal Neuralgia, DM2, Kidney Stone,  Chronic insomnia, unsuccessfull with multiple treatments.  Didn't follow through with CBT in past -Trelegy 100, Adderall 10 bid, pregabalin  25 1-2 tabs HS At last visit had acute asthmatic bronchitis>> depo 80, prednisone  taper, phenergan  DM, Trelegy 100  then 2 month f/u. Discussed the use of AI scribe software for clinical note transcription with the patient, who gave verbal consent to proceed.  History of Present Illness   The patient, with a history of bronchitis and thyroid  issues, presents for a follow-up visit. She reports a prolonged recovery from  recent bout of bronchitis, which we addressed at last visit. She expresses concern about recurring bronchitis, which she experiences twice a year. This is within common range for adults. She incidentally mentions a recent dream about being diagnosed with breast cancer, which has caused her some anxiety. She is pending scheduled routine mammogram.  The patient also reports experiencing night sweats and hair loss, which she believes may be related to her thyroid  condition. She also expresses concern about potential digestive issues, including a possible ulcer, which she suspects may be related to her use of Trulicity. She reports taking an acid blocker at night for these symptoms. She also mentions having a small hiatal hernia. She will f/u with her endocrinologist.  The  patient is currently not taking Adderall or Lyrica , and reports generally good sleep, aside from unusual dreams. She expresses a dislike  for nighttime and difficulty falling asleep early. This is part of her chronic insomnia, which is actually less of a problem currently.   Assessment and Plan:    Chronic Insomnia - psychophysiologic pattern, probably with some hormonal component Counseled sleep hygiene   Recurrent Bronchitis Patient reports experiencing bronchitis twice a year, with the most recent episode being severe and prolonged. No current symptoms. -Continue current management plan.  Thyroid  Disorder Patient reports night sweats and hair loss, suspecting it may be related to thyroid  levels. Currently on Synthroid . -Encourage patient to discuss symptoms with endocrinologist at upcoming appointment.  Possible Digestive Issues Patient reports concerns about Trulicity affecting digestive system and possible ulcer. Currently taking Pepcid at night for hiatal hernia. -Recommend patient discuss these concerns with primary care provider or gastroenterologist.  Follow-up in 3 months.   10/08/23- 78 year old female never smoker followed for allergic Rhinitis/conjunctivitis, Acute Bronchitis,  Insomnia, hx minimal OSA/treated with weight loss(-failed CPAP and Provent nasal valves), Suspected Restless Legs,  complicated by history of Migraine, HTN, GERD, Thyroid  Cancer/ sgy/ RAI/ Hypothyroid, , Covid infection 01/2019, Jan2023/ molnupiravir ,  Carotid Artery Disease?, R Trigeminal Neuralgia, DM2, Kidney Stone,  Chronic insomnia, unsuccessfull with multiple treatments.  Didn't follow through with CBT in past -Trelegy 100, Adderall 10 bid, pregabalin  25 1-2 tabs HS Seen by [facility] for R otalgia, vasomotor rhinitis> Journavx(too expensive- didn't try it),  Stressed- daughter has colon cancer. Discussed the use of AI scribe software for clinical note transcription with the patient, who gave verbal  consent to proceed.  History of Present Illness   Teresa Molina is a 78 year old female who presents for follow-up of her chronic insomnia. She was awake until 5AM last night. Overall she has generally done better without meds. Current stresses include personal breast cyst, being watched, pending colonoscopy, recent resection of daughter's colon CA. A mammogram and two ultrasounds identified a cyst in her left breast, described as 'probably benign'. She experiences soreness in the area, especially with certain movements, although the cyst is not palpable. A follow-up is scheduled in three months.     Assessment and Plan   Insomnia- chronic primary - again reviewed sleep hygiene, importance of stress  Breast cyst Cyst in left breast identified as probably benign. No palpable mass. Radiologist opted against aspiration. - Repeat imaging in 3 months to monitor cyst.  Thyroid  cancer No evidence of thyroid  cancer recurrence since 2017. No symptoms or evidence of recurrence in stomach or breast.  Ear pain Intermittent sharp ear pain resolved with conservative management.     Review of Systems-see HPI + = positive Constitutional:     +weight loss-diet, night sweats, fevers, chills, fatigue, lassitude.  + insomnia HEENT:   +headaches, difficulty swallowing, tooth/dental problems, +sore throat,       No- sneezing, itching, no-ears itch,+nasal congestion, +post nasal drip,  CV:  No-   chest pain, orthopnea, PND, swelling in lower extremities, anasarca, dizziness, palpitations Resp: +shortness of breath with exertion or at rest.           productive cough, non-productive cough,  No-  coughing up of blood.              No-   change in color of mucus.  No- wheezing.   Skin: No-   rash or lesions. GI:  No-   heartburn, indigestion, abdominal pain, nausea, vomiting,  GU: MS:  No-   joint pain or swelling.   Neuro- :  Psych:  No- change  in mood or affect. Some- depression or anxiety.  No memory  loss.   Physical Exam  General- Alert, Oriented, Affect-appropriate, very pleasant,  Skin- rash-none, lesions- none, excoriation- none Lymphadenopathy- none Head- atraumatic            Eyes- Gross vision intact, PERRLA, conjunctivae clear secretions            Ears- Canals-, TMs ok,             Nose- clear , No- sores,  no -Septal dev, mucus, polyps, erosion, perforation.             Throat- Mallampati III , mucosa clear-not red , drainage- none, tonsils- atrophic     Neck- flexible , trachea midline, no stridor , thyroid  + incision scar barely visible, carotid- +I don't hear bruit today Chest - symmetrical excursion , unlabored           Heart/CV- RRR , no murmur , no gallop  , no rub, nl s1 s2                           - JVD- none , edema- none, stasis changes- none, varices- none           Lung-  wheeze+trace, cough-none, dullness-none, rub- none           Chest wall-  Abd- Br/ Gen/ Rectal- Not done, not indicated Extrem- cyanosis- none, clubbing, none, atrophy- none, strength- nl Neuro- grossly intact to observation

## 2023-10-08 ENCOUNTER — Encounter: Payer: Self-pay | Admitting: Internal Medicine

## 2023-10-08 ENCOUNTER — Ambulatory Visit: Payer: Medicare HMO | Admitting: Internal Medicine

## 2023-10-08 VITALS — BP 122/74 | HR 69 | Temp 97.6°F | Ht 63.0 in | Wt 172.8 lb

## 2023-10-08 DIAGNOSIS — F5101 Primary insomnia: Secondary | ICD-10-CM | POA: Diagnosis not present

## 2023-10-08 DIAGNOSIS — H9209 Otalgia, unspecified ear: Secondary | ICD-10-CM

## 2023-10-08 DIAGNOSIS — N6002 Solitary cyst of left breast: Secondary | ICD-10-CM | POA: Diagnosis not present

## 2023-10-08 DIAGNOSIS — Z85850 Personal history of malignant neoplasm of thyroid: Secondary | ICD-10-CM | POA: Diagnosis not present

## 2023-10-26 ENCOUNTER — Encounter: Payer: Self-pay | Admitting: Internal Medicine

## 2024-01-24 ENCOUNTER — Ambulatory Visit: Admitting: Internal Medicine

## 2024-01-31 ENCOUNTER — Other Ambulatory Visit: Payer: Self-pay

## 2024-02-03 LAB — SURGICAL PATHOLOGY

## 2024-02-05 ENCOUNTER — Encounter: Payer: Self-pay | Admitting: Internal Medicine

## 2024-02-05 ENCOUNTER — Ambulatory Visit: Admitting: Internal Medicine

## 2024-02-05 VITALS — BP 120/60 | HR 73 | Ht 63.0 in | Wt 176.2 lb

## 2024-02-05 DIAGNOSIS — E890 Postprocedural hypothyroidism: Secondary | ICD-10-CM | POA: Diagnosis not present

## 2024-02-05 DIAGNOSIS — C73 Malignant neoplasm of thyroid gland: Secondary | ICD-10-CM

## 2024-02-05 DIAGNOSIS — E559 Vitamin D deficiency, unspecified: Secondary | ICD-10-CM

## 2024-02-05 NOTE — Progress Notes (Signed)
 Patient ID: Teresa Molina, female   DOB: [birth year], 78 y.o.   MRN: [id number]  HPI  Teresa Molina is a 78 y.o.-year-old female, initially referred by Dr. Rockney derrick for f/u for papillary thyroid  cancer, postsurgical hypothyroidism, hypocalcemia, vitamin D  insufficiency. Last visit 6 months ago. PCP: Dr. Clarice.  Interim history: She previously described fatigue, feeling jittery, night sweats, and not able to sleep.  These resolved after decreasing her Synthroid  dose. She continues to have emotional lability.   + Occasional nocturnal leg mm cramps. No numbness and tingling. She recently had a benign breast bx.  Reviewed her thyroid  cancer cancer history: Patient has been found to have an incidental thyroid  nodule during a recent carotid ultrasound, on 03/10/2015. The nodule was seen in the left lobe and measured 0.9 x 0.8 x 0.9 centimeters.  Thyroid  U/S (05/23/2015): Left lower pole solid hypoechoic nodule measures 10 x 8 x 8 mm, nonspecific. There appears to be minor associated microcalcification.    L nodule was small, but hypoechoic and with possible small calcifications >> I suggested FNA.  Adequacy Reason Satisfactory For Evaluation. Diagnosis THYROID , LEFT LOBE INFERIOR, FINE NEEDLE ASPIRATION (SPECIMEN 1 OF 1, COLLECTED ON 06/07/2015): POSITIVE FOR PAPILLARY THYROID  CARCINOMA ([place] CATEGORY VI). LAMAR GRAFT MD Pathologist, Electronic Signature (Case signed 06/08/2015) Specimen Clinical Information Left lower pole solid hypoechoic nodule measures 10 x 8 x 8 mm, nonspecific, There appears to be minor associated microcalcification Source Thyroid , Fine Needle Aspiration, Left Lobe Inferior, (Specimen 1 of 1, collected on 06/07/15 )  She had total thyroidectomy by Dr. Eletha on 07/21/2015. Final pathology showed: Diagnosis Thyroid , thyroidectomy, total - PAPILLARY CARCINOMA, CLASSIC VARIANT, SPANNING 1.1 CM. - EXTRATHYROIDAL EXTENSION PRESENT. - RESECTION MARGINS  ARE NEGATIVE. - NO LYMPHOVASCULAR INVASION. - PARATHYROID TISSUE PRESENT. - SEE ONCOLOGY TABLE. Microscopic Comment THYROID  Specimen: Total thyroid . Procedure (including lymph node sampling if applicable): Total thyroidectomy. Specimen Integrity (intact/fragmented): Intact. Tumor focality: Unifocal. Dominant tumor: Maximum tumor size (cm): 1.1 cm. Tumor laterality: Left. Histologic type (including subtype and/or unique features as applicable): Papillary carcinoma, classic variant. Tumor capsule: Partial. Extrathyroidal extension: Present. Capsular invasion with degree of invasion if present: N/A. Margins: Negative. Lymphatic or vascular invasion: Not identified. Lymph nodes: # examined 0; # positive; 0 TNM code: pT3, pNX Non-neoplastic thyroid : Nodular hyperplasia, benign parathyroid tissue.  10/19/2015: RAI tx 123 mCi 10/28/2015: post-tx WBS: No metastasis 10/25/2016: Neck U/S:  no residual thyroid  tissue or adenopathy 08/03/2021: Neck U/S: No recurrences or residual malignancy  Thyroglobulin levels and ATA antibodies are undetectable: Lab Results  Component Value Date   THYROGLB <0.1 (L) 01/23/2023   THYROGLB 0.1 (L) 08/08/2022   THYROGLB <0.1 (L) 07/28/2021   THYROGLB <0.1 (L) 07/27/2020   THYROGLB <0.1 (L) 07/22/2019   THYROGLB <0.1 (L) 07/23/2018   THYROGLB <0.1 (L) 10/25/2017   THYROGLB 0.1 (L) 06/07/2017   THYROGLB <0.1 (L) 10/25/2016   THYROGLB 0.3 (L) 11/07/2015   THGAB <1 01/23/2023   THGAB <1 08/08/2022   THGAB <1 07/28/2021   THGAB <1 07/27/2020   THGAB <1 07/22/2019   THGAB <1 07/23/2018   THGAB <1 10/25/2017   THGAB <1 06/07/2017   THGAB <1 10/25/2016   THGAB <1 11/07/2015   Postsurgical hypothyroidism: Pt is on Synthroid  d.a.w. 75 mcg 6 out of 7 days, dose decreased 07/2023: - in am - fasting - at least 30 min from b'fast - no Fe, MVI, PPIs - + calcium  with dinner - not on Biotin  Reviewed her TFTs: Lab  Results  Component Value Date   TSH  1.36 09/18/2023   TSH 0.24 (L) 08/07/2023   TSH 0.74 01/23/2023   TSH 0.38 10/05/2022   TSH 0.31 (L) 08/08/2022   TSH 1.53 03/28/2022   TSH 0.66 02/07/2022   TSH 0.81 10/03/2021   TSH 3.75 07/28/2021   TSH 2.07 01/27/2021   FREET4 1.4 09/18/2023   FREET4 1.8 08/07/2023   FREET4 1.41 10/05/2022   FREET4 1.50 08/08/2022   FREET4 1.19 03/28/2022   FREET4 1.19 02/07/2022   FREET4 1.31 10/03/2021   FREET4 0.98 07/28/2021   FREET4 0.97 01/27/2021   FREET4 1.11 07/27/2020  12/06/2022:  TSH 0.506 04/05/2017: TSH 7.87 01/02/2016: TSH 0.22. 03/07/2015: TSH 3.22, fT4 0.99   Postsurgical hypocalcemia:  She had a Calcium  of 7.4 postop >> started calcium  carbonate but she came off afterwards.  We restarted this 07/2018: 500 mg with dinner.  At last visit she was not taking this consistently, only 1-2 times a week.  I advised her to start taking this daily. Not taking this now.  Reviewed calcium  levels:  Ionized calcium : Lab Results  Component Value Date   CAION 5.1 08/07/2023   CAION 4.71 (L) 07/27/2020   CAION 4.69 (L) 01/27/2020   CAION 4.77 (L) 09/11/2019   CAION 4.27 (L) 07/22/2019   CAION 4.74 (L) 01/23/2019   CAION 4.66 (L) 07/23/2018  05/03/2021: Ionized calcium  4.92 (4.6-5.3) 11/03/2020: Ionized calcium  4.84 (4.6-5.3), calcium  8.9 (8.6-10.4), PTH 25.5  Total calcium : 09/06/2022: Calcium  8.6 (8.6-10.4) 03/15/2022: Calcium  8.7 (8.5-10.1), GFR 55 02/21/2022: Calcium  9.0, GFR 41 Lab Results  Component Value Date   CALCIUM  8.5 (L) 04/10/2021   CALCIUM  8.8 11/07/2015   CALCIUM  7.4 (L) 07/22/2015  11/03/2020: calcium  8.9 (8.6-10.4) 04/08/2017: Calcium  8.3  Magnesium level was previously low, then normal: Lab Results  Component Value Date   MG 1.6 07/27/2020  05/05/2020: Magnesium 1.7 (1.8-2.4)  Vitamin D  insufficiency:  Reviewed her vitamin D  levels: Lab Results  Component Value Date   VD25OH 40 08/07/2023   VD25OH 26.06 (L) 01/23/2023   VD25OH 32.17 08/08/2022    VD25OH 50.22 10/03/2021   VD25OH 29.35 (L) 07/28/2021   VD25OH 22.13 (L) 01/27/2021   VD25OH 26.2 (L) 07/27/2020   VD25OH 26.1 (L) 01/27/2020   VD25OH 33.36 07/22/2019   VD25OH 32.69 07/23/2018  05/05/2020 vitamin D  28  At last visit she was on 2000 units vitamin D  daily.  We increased the dose to 4000 units daily.  She was previously missing doses but was taking it consistently.  She now takes it every day.  Pt denies: - feeling nodules in neck - hoarseness - dysphagia - choking  No FH of thyroid  ds. No FH of thyroid  cancer. No h/o radiation tx to head or neck. No herbal supplements. No Biotin use. No recent steroids use.   She also has HTN, HL, GERD, sleep apnea.  She also has diabetes, managed by PCP.  HbA1c available for review: 06/27/2023: 6.7% << 6.6% << 6.1% << 7.2%.  Started Metformin >> could not tolerate it. She had yeast inf's with [place]. Previously on Ozempic. Had nausea with it >> improved after backing off the dose >> Trulicity afterwards. No results found for: HGBA1C  ROS: + see HPI  I reviewed pt's medications, allergies, PMH, social hx, family hx, and changes were documented in the history of present illness. Otherwise, unchanged from my initial visit note.  Past Medical History:  Diagnosis Date   Allergic conjunctivitis    ALLERGIC RHINITIS  Breast cyst    left breast - ultrasound benign 2010   Cancer (HCC)    Thyroid    COVID-19    Esophageal reflux    Food allergy    angioedema > strawberries   GERD (gastroesophageal reflux disease)    Hypertension    Insomnia    OSA on CPAP    NPSG 08-03-09: AHI 17.6; CPAP 12/ AHI 0; PLMA- mild does not wear cpap    PONV (postoperative nausea and vomiting)    Past Surgical History:  Procedure Laterality Date   BREAST SURGERY  2013   Rt breast mass excision   CATARACT EXTRACTION, BILATERAL     CHOLECYSTECTOMY  1991   DILATION AND CURETTAGE OF UTERUS     ROTATOR CUFF REPAIR  2006   squamous cell  excised     THYROIDECTOMY N/A 07/21/2015   Procedure: TOTAL THYROIDECTOMY;  Surgeon: Krystal Spinner, MD;  Location: [facility];  Service: General;  Laterality: N/A;   TONSILLECTOMY  1952 - approximate   TOTAL ABDOMINAL HYSTERECTOMY  1978   Social History   Social History   Marital Status: Divorced    Spouse Name: N/A   Number of Children: 2   Occupational History   Retired from [employer]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     benzonatate  (TESSALON ) 200 MG capsule Take 1 capsule (200 mg total) by mouth 3 (three) times daily as needed for cough. 30 capsule 1   calcium  carbonate (OS-CAL) 600 MG tablet Take 600 mg by mouth daily.     Cholecalciferol (VITAMIN D3) 3000 units TABS Take 1 tablet by mouth daily.     Fluticasone-Umeclidin-Vilant (TRELEGY ELLIPTA ) 100-62.5-25 MCG/ACT AEPB Inhale 1 puff into the lungs daily. (Patient taking differently: Inhale 1 puff into the lungs daily as needed.)     labetalol  (NORMODYNE ) 200 MG tablet Take 1 tablet (200 mg total) by mouth 2 (two) times daily. 60 tablet 0   OneTouch Delica Lancets 33G MISC USE AS DIRECTED TO CHECK BLOOD GLUCOSE DAILY E11.9     ONETOUCH VERIO test strip USE AS DIRECTED TO CHECK BLOOD GLUCOSE DAILY E11.9     SYNTHROID  75 MCG tablet Take 1 tablet by mouth before breakfast 6 out of 7 days. 90 tablet 3   TRULICITY 0.75 MG/0.5ML SOPN Inject into the skin.     No current facility-administered medications on file prior to visit.   Allergies  Allergen Reactions   Amlodipine Swelling   Diovan [Valsartan] Swelling   Lipitor  [Atorvastatin Calcium ] Other (See Comments)    Caused muscle paralysis in legs   Codeine Nausea And Vomiting   Compazine [Prochlorperazine Edisylate] Other (See Comments)    Facial  muscle spasms    Metformin Hcl Er     Other reaction(s): diarrhea, heartburn   Reglan [Metoclopramide] Other (See Comments)    Facial muscle spasms   Rosuvastatin Other (See Comments)    Severe joint pain. Other reaction(s): myalgia   Zoloft [Sertraline Hcl] Other (See Comments)  tremors   Benadryl [Diphenhydramine Hcl] Palpitations   Family History  Problem Relation Age of Onset   Other Father        brain tumor   COPD Mother    PE: BP 120/60   Pulse 73   Ht 5' 3 (1.6 m)   Wt 176 lb 3.2 oz (79.9 kg)   SpO2 95%   BMI 31.21 kg/m   Wt Readings from Last 10 Encounters:  02/05/24 176 lb 3.2 oz (79.9 kg)  10/08/23 172 lb 12.8 oz (78.4 kg)  08/07/23 170 lb 6.4 oz (77.3 kg)  07/09/23 172 lb 6.4 oz (78.2 kg)  04/30/23 172 lb (78 kg)  03/05/23 175 lb 6.4 oz (79.6 kg)  01/23/23 175 lb 0.3 oz (79.4 kg)  12/04/22 175 lb 3.2 oz (79.5 kg)  10/02/22 175 lb 3.2 oz (79.5 kg)  08/08/22 173 lb (78.5 kg)   Constitutional: Slightly overweight, in NAD Eyes:  EOMI, no exophthalmos ENT: no neck masses, no cervical lymphadenopathy Cardiovascular: RRR, No MRG Respiratory: CTA B Musculoskeletal: no deformities Skin:no rashes Neurological: no tremor with outstretched hands  ASSESSMENT: 1. Papillary thyroid  cancer  2. Postsurgical hypothyroidism  3.  Postsurgical hypocalcemia  4.  Vitamin D  insufficiency  5.  DM2, non-insulin-dependent, without long-term complications -Managed by her primary care doctor -HbA1c was 7.2% before she was started on metformin.  However, she could not tolerate this.  She tried [place] but developed yeast infections.  She was reviewed history on Ozempic but had nausea with it so she switched to Trulicity.  She tolerates this well.  She does have decreased appetite.  She  misses meals.  PLAN: 1. PTC -Patient with a small papillary thyroid  cancer, measuring 1.1 cm in the largest dimension, unilateral.  There was no lymphovascular invasion, however, there was extrathyroidal extension.  Because of this, I considered her to be at intermediate risk, rather than low risk, for recurrence.  She had RAI treatment with Thyrogen .  The posttreatment whole-body scan did not show any any abnormal uptake.  Thyroid  ultrasound from 10/2016 showed no recurrence.  Her latest thyroid  ultrasound is from 07/2021 and showed no recurrent cancer or abnormal masses. -We are following her by thyroglobulin and ATA antibodies.  Her thyroglobulin is either undetectable or barely detectable, at 0.1, and her antithyroglobulin antibodies are undetectable.  Will recheck this today - No neck masses palpated at today's visit -She prefers to come back on a 40-month basis, rather than once a year  2. Postsurgical hypothyroidism - latest thyroid  labs reviewed with pt. >> normal: Lab Results  Component Value Date   TSH 1.36 09/18/2023  - she continues on LT4 75 mcg 6 out of 7 days (64 mcg average daily dose), dose decreased at last visit, when the TSH was slightly low, at 0.24 - pt feels good on this dose.  At last visit she described hot flashes/night sweats.  These resolved.  She continues to have some emotional lability and also fatigue. - we discussed about taking the thyroid  hormone every day, with water, >30 minutes before breakfast, separated by >4 hours from acid reflux medications, calcium , iron, multivitamins. Pt. is taking it correctly. - will check thyroid  tests today: TSH and fT4 - If labs are abnormal, she will need to return for repeat TFTs in 1.5 months  3.  Postsurgical hypocalcemia - Resolved - No perioral numbness or tingling.  She continues to have occasional muscle cramps.   - Calcium  and PTH levels were normal after the  initial postoperative period - Latest ionized calcium  was  normal, at 5.1 at last check - She continues 500 mg of calcium  with dinner - We did discuss about taking an extra calcium  tablet if she had muscle cramps - per her request, we will check another ionized calcium  today.  4.  Vitamin D  insufficiency -At last visit, vitamin D  level was normal, at 40 - Continued 4000 units vitamin D  daily - Will recheck the level at next visit  Orders Placed This Encounter  Procedures   TSH   Thyroglobulin Level   Thyroglobulin antibody   T4, free   Calcium , ionized   Lela Fendt, MD PhD [facility]

## 2024-02-05 NOTE — Patient Instructions (Signed)
 Please continue Synthroid  75 mcg 6/7 days.  Take the thyroid  hormone every day, with water, at least 30 minutes before breakfast, separated by at least 4 hours from: - acid reflux medications - calcium  - iron - multivitamins  Continue: - Calcium  500 mg with dinner. You may take an extra calcium  tablet as needed - Vitamin D  4000 units daily  Please come back for a follow-up appointment in 6 months.

## 2024-02-06 ENCOUNTER — Ambulatory Visit: Payer: Self-pay | Admitting: Internal Medicine

## 2024-02-06 LAB — THYROGLOBULIN LEVEL: Thyroglobulin: 0.1 ng/mL — ABNORMAL LOW

## 2024-02-06 LAB — THYROGLOBULIN ANTIBODY: Thyroglobulin Ab: <1 [IU]/mL (ref <=1)

## 2024-02-06 LAB — T4, FREE: Free T4: 1.3 ng/dL (ref 0.8–1.8)

## 2024-02-06 LAB — CALCIUM, IONIZED: Calcium, Ion: 4.8 mg/dL (ref 4.7–5.5)

## 2024-02-06 LAB — TSH: TSH: 1.26 m[IU]/L (ref 0.40–4.50)

## 2024-03-09 NOTE — Progress Notes (Signed)
 HPI Female never smoker followed for allergic Rhinitis/conjunctivitis, Insomnia, minimal OSA/treated with weight loss-failed CPAP and Provent nasal valves, complicated by history of migraine, hypertension, GERD, Thyroid  Cancer/ sgy/ RAI/ Hypothyroid, , Covid infection 01/2019, Carotid Artery Disease, R Trigeminal Neuralgia, DM2, Chronic insomnia, unsuccessfull with multiple treatments. Mild OSA and intolerant of CPAP, unwilling to try others. Didn't follow through with CBT in past PFT 07/23/19- WNL- FEV1/FVC 0.77, TLC 95%, DLCO 99%. Lab 09/24/22- IgE 27, Eos 0.1 HST 12/25/22- WNL- AHI 4.7/hr, nl O2, body weight 173 lbs ------------------------------------------------------------------------------------------------    10/08/23- 78 year old female never smoker followed for allergic Rhinitis/conjunctivitis, Acute Bronchitis,  Insomnia, hx minimal OSA/treated with weight loss(-failed CPAP and Provent nasal valves), Suspected Restless Legs,  complicated by history of Migraine, HTN, GERD, Thyroid  Cancer/ sgy/ RAI/ Hypothyroid, , Covid infection 01/2019, Jan2023/ molnupiravir ,  Carotid Artery Disease?, R Trigeminal Neuralgia, DM2, Kidney Stone,  Chronic insomnia, unsuccessfull with multiple treatments.  Didn't follow through with CBT in past -Trelegy 100, Adderall 10 bid, pregabalin  25 1-2 tabs HS Seen by [facility] for R otalgia, vasomotor rhinitis> Journavx(too expensive- didn't try it),  Stressed- daughter has colon cancer. Discussed the use of AI scribe software for clinical note transcription with the patient, who gave verbal consent to proceed.  History of Present Illness   Teresa Molina is a 78 year old female who presents for follow-up of her chronic insomnia. She was awake until 5AM last night. Overall she has generally done better without meds. Current stresses include personal breast cyst, being watched, pending colonoscopy, recent resection of daughter's colon CA. A mammogram and two ultrasounds  identified a cyst in her left breast, described as 'probably benign'. She experiences soreness in the area, especially with certain movements, although the cyst is not palpable. A follow-up is scheduled in three months.     Assessment and Plan   Insomnia- chronic primary - again reviewed sleep hygiene, importance of stress  Breast cyst Cyst in left breast identified as probably benign. No palpable mass. Radiologist opted against aspiration. - Repeat imaging in 3 months to monitor cyst.  Thyroid  cancer No evidence of thyroid  cancer recurrence since 2017. No symptoms or evidence of recurrence in stomach or breast.  Ear pain Intermittent sharp ear pain resolved with conservative management.   03/12/24-  78 year old female never smoker followed for allergic Rhinitis/conjunctivitis, Acute Bronchitis,  Insomnia, hx minimal OSA/treated with weight loss(-failed CPAP and Provent nasal valves), Suspected Restless Legs,  complicated by history of Migraine, HTN, GERD, Thyroid  Cancer/ sgy/ RAI/ Hypothyroid , Covid infection 01/2019, Jan2023/ molnupiravir ,  Carotid Artery Disease?, R Trigeminal Neuralgia, DM2, Kidney Stone, Glaucoma, -Trelegy 100 Chronic insomnia, unsuccessfull with multiple treatments.  Didn't follow through with CBT in past  Sleep issues stable. Not taking sleep aid. Added stress now - running for political office [facility] No breathing flare. Currently no exacerbation of occasional asthmatic bronchitis- mostly viral She wants to maintain connection with this group after I retire.    Review of Systems-see HPI + = positive Constitutional:     +weight loss-diet, night sweats, fevers, chills, fatigue, lassitude.  + insomnia HEENT:   +headaches, difficulty swallowing, tooth/dental problems, +sore throat,       No- sneezing, itching, no-ears itch,+nasal congestion, +post nasal drip,  CV:  No-   chest pain, orthopnea, PND, swelling in lower extremities, anasarca, dizziness,  palpitations Resp: +shortness of breath with exertion or at rest.           productive cough, non-productive cough,  No-  coughing up of blood.              No-   change in color of mucus.  No- wheezing.   Skin: No-   rash or lesions. GI:  No-   heartburn, indigestion, abdominal pain, nausea, vomiting,  GU: MS:  No-   joint pain or swelling.   Neuro- :  Psych:  No- change in mood or affect. Some- depression or anxiety.  No memory loss.   Physical Exam  General- Alert, Oriented, Affect-appropriate, very pleasant,  Skin- rash-none, lesions- none, excoriation- none Lymphadenopathy- none Head- atraumatic            Eyes- Gross vision intact, PERRLA, conjunctivae clear secretions            Ears- Canals-, TMs ok,             Nose- clear , No- sores,  no -Septal dev, mucus, polyps, erosion, perforation.             Throat- Mallampati III , mucosa clear-not red , drainage- none, tonsils- atrophic     Neck- flexible , trachea midline, no stridor , thyroid  + incision scar barely visible, carotid- +I don't hear bruit today Chest - symmetrical excursion , unlabored           Heart/CV- RRR , no murmur , no gallop  , no rub, nl s1 s2                           - JVD- none , edema- none, stasis changes- none, varices- none           Lung-  wheeze-none, cough-none, dullness-none, rub- none           Chest wall-  Abd- Br/ Gen/ Rectal- Not done, not indicated Extrem- cyanosis- none, clubbing, none, atrophy- none, strength- nl Neuro- grossly intact to observation

## 2024-03-12 ENCOUNTER — Ambulatory Visit: Admitting: Internal Medicine

## 2024-03-12 ENCOUNTER — Encounter: Payer: Self-pay | Admitting: Internal Medicine

## 2024-03-12 VITALS — BP 126/74 | HR 67 | Temp 97.6°F | Ht 63.0 in | Wt 180.4 lb

## 2024-03-12 DIAGNOSIS — J209 Acute bronchitis, unspecified: Secondary | ICD-10-CM | POA: Diagnosis not present

## 2024-03-12 DIAGNOSIS — G4700 Insomnia, unspecified: Secondary | ICD-10-CM

## 2024-03-12 DIAGNOSIS — G4733 Obstructive sleep apnea (adult) (pediatric): Secondary | ICD-10-CM

## 2024-03-12 DIAGNOSIS — J4520 Mild intermittent asthma, uncomplicated: Secondary | ICD-10-CM

## 2024-03-12 NOTE — Patient Instructions (Signed)
 Glad your bronchitis is doing well now- we suggested you might like Dr Slater Staff at our [facility] site.

## 2024-03-17 ENCOUNTER — Encounter: Payer: Self-pay | Admitting: Internal Medicine

## 2024-03-17 NOTE — Assessment & Plan Note (Signed)
 Inactive at current weight

## 2024-03-17 NOTE — Assessment & Plan Note (Signed)
 Continues mild, intermittent uncomplicated. Has Trelegy, used during intervals. Watch need for additional intervention. Keep up vaccinations.

## 2024-04-30 ENCOUNTER — Other Ambulatory Visit: Payer: Self-pay | Admitting: Internal Medicine

## 2024-05-23 ENCOUNTER — Ambulatory Visit
Admission: EM | Admit: 2024-05-23 | Discharge: 2024-05-23 | Disposition: A | Discharge status: Home / Self Care | Attending: Family Medicine | Admitting: Family Medicine

## 2024-05-23 DIAGNOSIS — J4521 Mild intermittent asthma with (acute) exacerbation: Secondary | ICD-10-CM

## 2024-05-23 DIAGNOSIS — J208 Acute bronchitis due to other specified organisms: Secondary | ICD-10-CM | POA: Diagnosis not present

## 2024-05-23 MED ORDER — AZELASTINE HCL 0.1 % NA SOLN: 1.0000 | Freq: Two times a day (BID) | NASAL | 0 refills | Status: AC

## 2024-05-23 MED ORDER — ALBUTEROL SULFATE HFA 108 (90 BASE) MCG/ACT IN AERS: 2.0000 | INHALATION_SPRAY | RESPIRATORY_TRACT | 0 refills | Status: AC | PRN

## 2024-05-23 MED ORDER — BENZONATATE 200 MG PO CAPS: 200.0000 mg | ORAL_CAPSULE | Freq: Three times a day (TID) | ORAL | 0 refills | Status: AC | PRN

## 2024-05-23 MED ORDER — ALBUTEROL SULFATE (2.5 MG/3ML) 0.083% IN NEBU
2.5000 mg | INHALATION_SOLUTION | Freq: Once | RESPIRATORY_TRACT | Status: AC
  Administered 2024-05-23: 2.5 mg via RESPIRATORY_TRACT

## 2024-05-23 MED ORDER — GUAIFENESIN ER 600 MG PO TB12: 600.0000 mg | ORAL_TABLET | Freq: Two times a day (BID) | ORAL | 0 refills | Status: AC | PRN

## 2024-05-23 MED ORDER — PREDNISONE 20 MG PO TABS: 20.0000 mg | ORAL_TABLET | Freq: Every day | ORAL | 0 refills | Status: AC

## 2024-05-23 NOTE — ED Triage Notes (Addendum)
 Pt states cough, wheezing and SOB  for the past nine days.  States she had a negative home covid and flu test.

## 2024-05-23 NOTE — Discharge Instructions (Signed)
 I have sent over a low-dose of prednisone , albuterol  inhaler, Mucinex , cough medicine and a nasal spray to help combat your symptoms.  You may additionally use humidifiers, saline sinus rinses, and other supportive remedies.  Follow-up for worsening or unresolving symptoms

## 2024-05-24 NOTE — ED Provider Notes (Signed)
 " [facility]    CSN: [id number] Arrival date & time: 05/23/24  1548      History   Chief Complaint Chief Complaint  Patient presents with   Cough    HPI Teresa Molina is a 79 y.o. female.   Patient presenting today with about a week and a half of ongoing cough, wheezing, shortness of breath, chest tightness.  Denies fever, chills, chest pain, abdominal pain, vomiting, diarrhea.  Has had a negative home COVID and flu test so far.  She states she has tried to see her pulmonologist and her primary care so far since symptom onset but has been unable to get in anywhere.  Not trying anything over-the-counter for symptoms.  History of asthma and seasonal allergies, does not have any inhalers at home at this time.    Past Medical History:  Diagnosis Date   Allergic conjunctivitis    ALLERGIC RHINITIS    Breast cyst    left breast - ultrasound benign 2010   Cancer (HCC)    Thyroid    COVID-19    Esophageal reflux    Food allergy    angioedema > strawberries   GERD (gastroesophageal reflux disease)    Hypertension    Insomnia    OSA on CPAP    NPSG 08-03-09: AHI 17.6; CPAP 12/ AHI 0; PLMA- mild does not wear cpap    PONV (postoperative nausea and vomiting)     Patient Active Problem List   Diagnosis Date Noted   URI (upper respiratory infection) 02/09/2022   Vitamin D  insufficiency 07/27/2020   Carotid artery disease 09/15/2019   Dyspnea on exertion 05/28/2019   COVID-19 virus infection 01/30/2019   Iatrogenic hypocalcemia 01/23/2019   Trigeminal neuralgia of right side of face 06/27/2016   Postoperative hypothyroidism 08/08/2015   Papillary thyroid  carcinoma (HCC) 07/17/2015   Restless legs 11/04/2009   Obstructive sleep apnea 09/07/2009   Migraine with aura 11/10/2008   Asthmatic bronchitis, mild intermittent, uncomplicated 06/30/2008   G E R D 05/30/2007   ALLERGIC CONJUNCTIVITIS 04/15/2007   HYPERTENSION 04/15/2007   Seasonal and perennial allergic  rhinitis 04/15/2007   Insomnia 04/15/2007    Past Surgical History:  Procedure Laterality Date   BREAST SURGERY  2013   Rt breast mass excision   CATARACT EXTRACTION, BILATERAL     CHOLECYSTECTOMY  1991   DILATION AND CURETTAGE OF UTERUS     ROTATOR CUFF REPAIR  2006   squamous cell excised     THYROIDECTOMY N/A 07/21/2015   Procedure: TOTAL THYROIDECTOMY;  Surgeon: Krystal Spinner, MD;  Location: [facility];  Service: General;  Laterality: N/A;   TONSILLECTOMY  1952 - approximate   TOTAL ABDOMINAL HYSTERECTOMY  1978    OB History     Gravida  2   Para  2   Term  2   Preterm      AB      Living  2      SAB      IAB      Ectopic      Multiple      Live Births               Home Medications    Prior to Admission medications  Medication Sig Start Date End Date Taking? Authorizing Provider  albuterol  (VENTOLIN  HFA) 108 (90 Base) MCG/ACT inhaler Inhale 2 puffs into the lungs every 4 (four) hours as needed. 05/23/24  Yes Stuart Vernell Norris, PA-C  azelastine  (ASTELIN )  0.1 % nasal spray Place 1 spray into both nostrils 2 (two) times daily. Use in each nostril as directed 05/23/24  Yes Stuart Vernell Norris, PA-C  benzonatate  (TESSALON ) 200 MG capsule Take 1 capsule (200 mg total) by mouth 3 (three) times daily as needed for cough. 05/23/24  Yes Stuart Vernell Norris, PA-C  guaiFENesin  (MUCINEX ) 600 MG 12 hr tablet Take 1 tablet (600 mg total) by mouth 2 (two) times daily as needed. 05/23/24  Yes Stuart Vernell Norris, PA-C  predniSONE  (DELTASONE ) 20 MG tablet Take 1 tablet (20 mg total) by mouth daily with breakfast. 05/23/24  Yes Stuart Vernell Norris, PA-C  Bempedoic Acid-Ezetimibe  (NEXLIZET) 180-10 MG TABS Take 10 mg by mouth daily.    [provider]  benzonatate  (TESSALON ) 200 MG capsule Take 1 capsule (200 mg total) by mouth 3 (three) times daily as needed for cough. 06/01/21   Neysa Reggy BIRCH, MD  calcium  carbonate (OS-CAL) 600 MG tablet Take 600 mg by  mouth daily.    [provider]  Cholecalciferol (VITAMIN D3) 3000 units TABS Take 1 tablet by mouth daily.    [provider]  Fluticasone-Umeclidin-Vilant (TRELEGY ELLIPTA ) 100-62.5-25 MCG/ACT AEPB Inhale 1 puff into the lungs daily. Patient taking differently: Inhale 1 puff into the lungs daily as needed. 04/30/23   Neysa Reggy D, MD  labetalol  (NORMODYNE ) 200 MG tablet Take 1 tablet (200 mg total) by mouth 2 (two) times daily. 07/27/13   Croitoru, Mihai, MD  OneTouch Delica Lancets 33G MISC USE AS DIRECTED TO CHECK BLOOD GLUCOSE DAILY E11.9 03/23/19   [provider]  ONETOUCH VERIO test strip USE AS DIRECTED TO CHECK BLOOD GLUCOSE DAILY E11.9 03/24/19   [provider]  SYNTHROID  75 MCG tablet TAKE ONE TABLET BY MOUTH BEFORE BREAKFAST SIX OUT OF SEVEN DAYS 05/01/24   Trixie File, MD  TRULICITY 0.75 MG/0.5ML SOPN Inject into the skin. 05/03/21   [provider]    Family History Family History  Problem Relation Age of Onset   Other Father        brain tumor   COPD Mother     Social History Social History[1]   Allergies   Amlodipine, Diovan [valsartan], Lipitor [atorvastatin calcium ], Codeine, Compazine [prochlorperazine edisylate], Metformin hcl er, Reglan [metoclopramide], Rosuvastatin, Zoloft [sertraline hcl], and Benadryl [diphenhydramine hcl]   Review of Systems Review of Systems Per HPI  Physical Exam Triage Vital Signs ED Triage Vitals  Encounter Vitals Group     BP 05/23/24 1555 116/73     Girls Systolic BP Percentile --      Girls Diastolic BP Percentile --      Boys Systolic BP Percentile --      Boys Diastolic BP Percentile --      Pulse Rate 05/23/24 1555 76     Resp 05/23/24 1555 16     Temp 05/23/24 1555 98.5 F (36.9 C)     Temp Source 05/23/24 1555 Oral     SpO2 05/23/24 1555 93 %     Weight --      Height --      Head Circumference --      Peak Flow --      Pain Score 05/23/24 1554 0     Pain  Loc --      Pain Education --      Exclude from Growth Chart --    No data found.  Updated Vital Signs BP 116/73 (BP Location: Right Arm)   Pulse 76  Temp 98.5 F (36.9 C) (Oral)   Resp 16   SpO2 93%   Visual Acuity Right Eye Distance:   Left Eye Distance:   Bilateral Distance:    Right Eye Near:   Left Eye Near:    Bilateral Near:     Physical Exam Vitals and nursing note reviewed.  Constitutional:      Appearance: Normal appearance.  HENT:     Head: Atraumatic.     Right Ear: Tympanic membrane and external ear normal.     Left Ear: Tympanic membrane and external ear normal.     Nose: Rhinorrhea present.     Mouth/Throat:     Mouth: Mucous membranes are moist.     Pharynx: No posterior oropharyngeal erythema.  Eyes:     Extraocular Movements: Extraocular movements intact.     Conjunctiva/sclera: Conjunctivae normal.  Cardiovascular:     Rate and Rhythm: Normal rate and regular rhythm.     Heart sounds: Normal heart sounds.  Pulmonary:     Effort: Pulmonary effort is normal.     Breath sounds: Wheezing present. No rales.  Musculoskeletal:        General: Normal range of motion.     Cervical back: Normal range of motion and neck supple.  Skin:    General: Skin is warm and dry.  Neurological:     Mental Status: She is alert and oriented to person, place, and time.  Psychiatric:        Mood and Affect: Mood normal.        Thought Content: Thought content normal.      UC Treatments / Results  Labs (all labs ordered are listed, but only abnormal results are displayed) Labs Reviewed - No data to display  EKG   Radiology No results found.  Procedures Procedures (including critical care time)  Medications Ordered in UC Medications  albuterol  (PROVENTIL ) (2.5 MG/3ML) 0.083% nebulizer solution 2.5 mg (2.5 mg Nebulization Given 05/23/24 1606)    Initial Impression / Assessment and Plan / UC Course  I have reviewed the triage vital signs and the  nursing notes.  Pertinent labs & imaging results that were available during my care of the patient were reviewed by me and considered in my medical decision making (see chart for details).     Suspect viral bronchitis and asthma exacerbation.  Vitals and exam very reassuring today, she does not currently have any inhalers at home so we will prescribe albuterol  rescue inhaler as well as provide an albuterol  nebulizer treatment prior to discharge in addition to treating with prednisone , Tessalon , Astelin , Mucinex .  Discussed supportive over-the-counter medications, home care and return precautions.  Final Clinical Impressions(s) / UC Diagnoses   Final diagnoses:  Viral bronchitis  Mild intermittent asthma with acute exacerbation     Discharge Instructions      I have sent over a low-dose of prednisone , albuterol  inhaler, Mucinex , cough medicine and a nasal spray to help combat your symptoms.  You may additionally use humidifiers, saline sinus rinses, and other supportive remedies.  Follow-up for worsening or unresolving symptoms   ED Prescriptions     Medication Sig Dispense Auth. Provider   predniSONE  (DELTASONE ) 20 MG tablet Take 1 tablet (20 mg total) by mouth daily with breakfast. 5 tablet Stuart Vernell Norris, PA-C   albuterol  (VENTOLIN  HFA) 108 (90 Base) MCG/ACT inhaler Inhale 2 puffs into the lungs every 4 (four) hours as needed. 18 g Stuart Vernell Norris, PA-C   benzonatate  (TESSALON )  200 MG capsule Take 1 capsule (200 mg total) by mouth 3 (three) times daily as needed for cough. 20 capsule Stuart Vernell Norris, PA-C   azelastine  (ASTELIN ) 0.1 % nasal spray Place 1 spray into both nostrils 2 (two) times daily. Use in each nostril as directed 30 mL Stuart Vernell Norris, PA-C   guaiFENesin  (MUCINEX ) 600 MG 12 hr tablet Take 1 tablet (600 mg total) by mouth 2 (two) times daily as needed. 20 tablet Stuart Vernell Norris, [place]      PDMP not reviewed this encounter.     [1]  Social History Tobacco Use   Smoking status: Never   Smokeless tobacco: Never  Vaping Use   Vaping status: Never Used  Substance Use Topics   Alcohol use: Yes    Alcohol/week: 0.0 standard drinks of alcohol    Comment: occasional    Drug use: No     Stuart Vernell Norris, PA-C 05/24/24 1451  "

## 2024-08-05 ENCOUNTER — Ambulatory Visit: Admitting: Internal Medicine
